# Patient Record
Sex: Male | Born: 1950
Health system: Southern US, Community
[De-identification: ages and names within clinical notes are randomized; demographics above are authoritative.]

## PROBLEM LIST (undated history)

## (undated) DIAGNOSIS — I10 Essential (primary) hypertension: Secondary | ICD-10-CM

## (undated) DIAGNOSIS — E538 Deficiency of other specified B group vitamins: Secondary | ICD-10-CM

## (undated) DIAGNOSIS — G4730 Sleep apnea, unspecified: Secondary | ICD-10-CM

## (undated) DIAGNOSIS — E119 Type 2 diabetes mellitus without complications: Secondary | ICD-10-CM

## (undated) DIAGNOSIS — E669 Obesity, unspecified: Secondary | ICD-10-CM

## (undated) DIAGNOSIS — F419 Anxiety disorder, unspecified: Secondary | ICD-10-CM

## (undated) DIAGNOSIS — M1990 Unspecified osteoarthritis, unspecified site: Secondary | ICD-10-CM

## (undated) DIAGNOSIS — K2210 Ulcer of esophagus without bleeding: Secondary | ICD-10-CM

## (undated) DIAGNOSIS — K219 Gastro-esophageal reflux disease without esophagitis: Secondary | ICD-10-CM

## (undated) DIAGNOSIS — K635 Polyp of colon: Secondary | ICD-10-CM

## (undated) DIAGNOSIS — E785 Hyperlipidemia, unspecified: Secondary | ICD-10-CM

## (undated) HISTORY — DX: Obesity, unspecified: E66.9

## (undated) HISTORY — DX: Sleep apnea, unspecified: G47.30

## (undated) HISTORY — DX: Polyp of colon: K63.5

## (undated) HISTORY — DX: Unspecified osteoarthritis, unspecified site: M19.90

## (undated) HISTORY — PX: KNEE ARTHROSCOPY: SUR90

## (undated) HISTORY — DX: Ulcer of esophagus without bleeding: K22.10

## (undated) HISTORY — DX: Essential (primary) hypertension: I10

## (undated) HISTORY — DX: Deficiency of other specified B group vitamins: E53.8

## (undated) HISTORY — DX: Hyperlipidemia, unspecified: E78.5

## (undated) HISTORY — DX: Gastro-esophageal reflux disease without esophagitis: K21.9

---

## 1998-09-29 ENCOUNTER — Ambulatory Visit: Admission: RE | Admit: 1998-09-29 | Discharge: 1998-09-29 | Payer: Self-pay | Admitting: Otolaryngology

## 2004-10-02 DIAGNOSIS — K635 Polyp of colon: Secondary | ICD-10-CM

## 2004-10-02 DIAGNOSIS — K2210 Ulcer of esophagus without bleeding: Secondary | ICD-10-CM

## 2004-10-02 HISTORY — DX: Polyp of colon: K63.5

## 2004-10-02 HISTORY — DX: Ulcer of esophagus without bleeding: K22.10

## 2004-11-18 ENCOUNTER — Ambulatory Visit: Payer: Self-pay | Admitting: Internal Medicine

## 2004-12-09 ENCOUNTER — Ambulatory Visit: Payer: Self-pay | Admitting: Internal Medicine

## 2005-01-09 ENCOUNTER — Ambulatory Visit: Payer: Self-pay | Admitting: Gastroenterology

## 2005-04-27 ENCOUNTER — Ambulatory Visit: Payer: Self-pay | Admitting: Internal Medicine

## 2005-05-03 ENCOUNTER — Ambulatory Visit: Payer: Self-pay | Admitting: Internal Medicine

## 2005-05-22 ENCOUNTER — Ambulatory Visit: Payer: Self-pay | Admitting: Gastroenterology

## 2005-07-13 ENCOUNTER — Encounter (INDEPENDENT_AMBULATORY_CARE_PROVIDER_SITE_OTHER): Payer: Self-pay | Admitting: Specialist

## 2005-07-13 ENCOUNTER — Ambulatory Visit: Payer: Self-pay | Admitting: Gastroenterology

## 2005-07-13 LAB — HM COLONOSCOPY

## 2005-08-11 ENCOUNTER — Ambulatory Visit: Payer: Self-pay | Admitting: Internal Medicine

## 2005-08-15 ENCOUNTER — Ambulatory Visit: Payer: Self-pay | Admitting: Internal Medicine

## 2005-10-03 ENCOUNTER — Ambulatory Visit: Payer: Self-pay | Admitting: Internal Medicine

## 2005-10-09 ENCOUNTER — Ambulatory Visit: Payer: Self-pay | Admitting: Endocrinology

## 2006-04-26 ENCOUNTER — Emergency Department (HOSPITAL_COMMUNITY): Admission: EM | Admit: 2006-04-26 | Discharge: 2006-04-26 | Payer: Self-pay | Admitting: Emergency Medicine

## 2006-06-13 ENCOUNTER — Ambulatory Visit: Payer: Self-pay | Admitting: Internal Medicine

## 2006-06-15 ENCOUNTER — Ambulatory Visit: Payer: Self-pay | Admitting: Internal Medicine

## 2006-08-27 ENCOUNTER — Ambulatory Visit: Payer: Self-pay | Admitting: Internal Medicine

## 2006-11-08 ENCOUNTER — Ambulatory Visit: Payer: Self-pay | Admitting: Internal Medicine

## 2007-01-07 ENCOUNTER — Ambulatory Visit: Payer: Self-pay | Admitting: Internal Medicine

## 2007-01-07 LAB — CONVERTED CEMR LAB
ALT: 51 units/L — ABNORMAL HIGH (ref 0–40)
AST: 35 units/L (ref 0–37)
Albumin: 3.8 g/dL (ref 3.5–5.2)
Alkaline Phosphatase: 44 units/L (ref 39–117)
BUN: 15 mg/dL (ref 6–23)
Basophils Absolute: 0 10*3/uL (ref 0.0–0.1)
Basophils Relative: 0.4 % (ref 0.0–1.0)
Bilirubin, Direct: 0.2 mg/dL (ref 0.0–0.3)
CO2: 31 meq/L (ref 19–32)
Calcium: 9.1 mg/dL (ref 8.4–10.5)
Chloride: 107 meq/L (ref 96–112)
Cholesterol: 200 mg/dL (ref 0–200)
Creatinine, Ser: 1 mg/dL (ref 0.4–1.5)
Eosinophils Absolute: 0.1 10*3/uL (ref 0.0–0.6)
Eosinophils Relative: 2.4 % (ref 0.0–5.0)
GFR calc Af Amer: 101 mL/min
GFR calc non Af Amer: 83 mL/min
Glucose, Bld: 123 mg/dL — ABNORMAL HIGH (ref 70–99)
HCT: 45.6 % (ref 39.0–52.0)
HDL: 39.3 mg/dL (ref >39.0)
Hemoglobin: 15.6 g/dL (ref 13.0–17.0)
Hgb A1c MFr Bld: 5.7 % (ref 4.6–6.0)
LDL Cholesterol: 135 mg/dL — ABNORMAL HIGH (ref 0–99)
Lymphocytes Relative: 22 % (ref 12.0–46.0)
MCHC: 34.2 g/dL (ref 30.0–36.0)
MCV: 98.3 fL (ref 78.0–100.0)
Monocytes Absolute: 0.8 10*3/uL — ABNORMAL HIGH (ref 0.2–0.7)
Monocytes Relative: 15.5 % — ABNORMAL HIGH (ref 3.0–11.0)
Neutro Abs: 3.2 10*3/uL (ref 1.4–7.7)
Neutrophils Relative %: 59.7 % (ref 43.0–77.0)
Platelets: 155 10*3/uL (ref 150–400)
Potassium: 4.5 meq/L (ref 3.5–5.1)
RBC: 4.64 M/uL (ref 4.22–5.81)
RDW: 11.6 % (ref 11.5–14.6)
Sodium: 143 meq/L (ref 135–145)
Total Bilirubin: 0.9 mg/dL (ref 0.3–1.2)
Total CHOL/HDL Ratio: 5.1
Total Protein: 6.8 g/dL (ref 6.0–8.3)
Triglycerides: 130 mg/dL (ref 0–149)
VLDL: 26 mg/dL (ref 0–40)
Vitamin B-12: 248 pg/mL (ref 211–911)
WBC: 5.3 10*3/uL (ref 4.5–10.5)

## 2007-01-10 ENCOUNTER — Ambulatory Visit: Payer: Self-pay | Admitting: Internal Medicine

## 2007-01-15 ENCOUNTER — Ambulatory Visit: Payer: Self-pay | Admitting: Internal Medicine

## 2007-04-27 ENCOUNTER — Encounter: Payer: Self-pay | Admitting: Internal Medicine

## 2007-04-27 DIAGNOSIS — E538 Deficiency of other specified B group vitamins: Secondary | ICD-10-CM | POA: Insufficient documentation

## 2007-04-27 DIAGNOSIS — R7309 Other abnormal glucose: Secondary | ICD-10-CM | POA: Insufficient documentation

## 2007-04-27 DIAGNOSIS — Z9989 Dependence on other enabling machines and devices: Secondary | ICD-10-CM | POA: Insufficient documentation

## 2007-04-27 DIAGNOSIS — G4730 Sleep apnea, unspecified: Secondary | ICD-10-CM

## 2007-04-27 DIAGNOSIS — K219 Gastro-esophageal reflux disease without esophagitis: Secondary | ICD-10-CM | POA: Insufficient documentation

## 2007-04-27 DIAGNOSIS — E785 Hyperlipidemia, unspecified: Secondary | ICD-10-CM | POA: Insufficient documentation

## 2007-04-27 DIAGNOSIS — J309 Allergic rhinitis, unspecified: Secondary | ICD-10-CM | POA: Insufficient documentation

## 2007-06-13 ENCOUNTER — Ambulatory Visit: Payer: Self-pay | Admitting: Internal Medicine

## 2007-06-13 LAB — CONVERTED CEMR LAB
ALT: 39 units/L (ref 0–53)
AST: 31 units/L (ref 0–37)
Albumin: 4 g/dL (ref 3.5–5.2)
Alkaline Phosphatase: 55 units/L (ref 39–117)
BUN: 10 mg/dL (ref 6–23)
Basophils Absolute: 0 10*3/uL (ref 0.0–0.1)
Basophils Relative: 0.5 % (ref 0.0–1.0)
Bilirubin, Direct: 0.2 mg/dL (ref 0.0–0.3)
CO2: 29 meq/L (ref 19–32)
Calcium: 9.1 mg/dL (ref 8.4–10.5)
Chloride: 105 meq/L (ref 96–112)
Cholesterol: 187 mg/dL (ref 0–200)
Creatinine, Ser: 1 mg/dL (ref 0.4–1.5)
Eosinophils Absolute: 0.2 10*3/uL (ref 0.0–0.6)
Eosinophils Relative: 3.2 % (ref 0.0–5.0)
Folate: 7.9 ng/mL
GFR calc Af Amer: 100 mL/min
GFR calc non Af Amer: 83 mL/min
Glucose, Bld: 113 mg/dL — ABNORMAL HIGH (ref 70–99)
HCT: 44.2 % (ref 39.0–52.0)
HDL: 35.6 mg/dL — ABNORMAL LOW (ref >39.0)
Hemoglobin: 15.6 g/dL (ref 13.0–17.0)
Hgb A1c MFr Bld: 5.6 % (ref 4.6–6.0)
LDL Cholesterol: 123 mg/dL — ABNORMAL HIGH (ref 0–99)
Lymphocytes Relative: 26.2 % (ref 12.0–46.0)
MCHC: 35.3 g/dL (ref 30.0–36.0)
MCV: 99.9 fL (ref 78.0–100.0)
Monocytes Absolute: 0.5 10*3/uL (ref 0.2–0.7)
Monocytes Relative: 10.1 % (ref 3.0–11.0)
Neutro Abs: 3.1 10*3/uL (ref 1.4–7.7)
Neutrophils Relative %: 60 % (ref 43.0–77.0)
PSA: 0.48 ng/mL (ref 0.10–4.00)
Platelets: 162 10*3/uL (ref 150–400)
Potassium: 4.5 meq/L (ref 3.5–5.1)
RBC: 4.42 M/uL (ref 4.22–5.81)
RDW: 11.6 % (ref 11.5–14.6)
Sodium: 139 meq/L (ref 135–145)
TSH: 2.64 microintl units/mL (ref 0.35–5.50)
Total Bilirubin: 1.1 mg/dL (ref 0.3–1.2)
Total CHOL/HDL Ratio: 5.3
Total Protein: 6.9 g/dL (ref 6.0–8.3)
Triglycerides: 143 mg/dL (ref 0–149)
VLDL: 29 mg/dL (ref 0–40)
Vitamin B-12: 342 pg/mL (ref 211–911)
WBC: 5.2 10*3/uL (ref 4.5–10.5)

## 2007-06-18 ENCOUNTER — Ambulatory Visit: Payer: Self-pay | Admitting: Internal Medicine

## 2007-06-27 ENCOUNTER — Encounter: Payer: Self-pay | Admitting: Internal Medicine

## 2007-06-27 DIAGNOSIS — I10 Essential (primary) hypertension: Secondary | ICD-10-CM | POA: Insufficient documentation

## 2007-09-06 ENCOUNTER — Encounter: Payer: Self-pay | Admitting: Internal Medicine

## 2008-02-21 ENCOUNTER — Ambulatory Visit: Payer: Self-pay | Admitting: Internal Medicine

## 2008-02-21 DIAGNOSIS — L0293 Carbuncle, unspecified: Secondary | ICD-10-CM

## 2008-02-21 DIAGNOSIS — M1990 Unspecified osteoarthritis, unspecified site: Secondary | ICD-10-CM | POA: Insufficient documentation

## 2008-02-21 DIAGNOSIS — L0292 Furuncle, unspecified: Secondary | ICD-10-CM | POA: Insufficient documentation

## 2008-08-07 ENCOUNTER — Ambulatory Visit: Payer: Self-pay | Admitting: Internal Medicine

## 2008-08-11 LAB — CONVERTED CEMR LAB
ALT: 59 units/L — ABNORMAL HIGH (ref 0–53)
AST: 42 units/L — ABNORMAL HIGH (ref 0–37)
Albumin: 4 g/dL (ref 3.5–5.2)
Alkaline Phosphatase: 48 units/L (ref 39–117)
BUN: 14 mg/dL (ref 6–23)
Basophils Absolute: 0 10*3/uL (ref 0.0–0.1)
Basophils Relative: 0.7 % (ref 0.0–3.0)
Bilirubin Urine: NEGATIVE
Bilirubin, Direct: 0.1 mg/dL (ref 0.0–0.3)
CO2: 29 meq/L (ref 19–32)
Calcium: 9.1 mg/dL (ref 8.4–10.5)
Chloride: 105 meq/L (ref 96–112)
Cholesterol: 193 mg/dL (ref 0–200)
Creatinine, Ser: 1 mg/dL (ref 0.4–1.5)
Eosinophils Absolute: 0.2 10*3/uL (ref 0.0–0.7)
Eosinophils Relative: 3.6 % (ref 0.0–5.0)
GFR calc Af Amer: 100 mL/min
GFR calc non Af Amer: 82 mL/min
Glucose, Bld: 108 mg/dL — ABNORMAL HIGH (ref 70–99)
HCT: 43.2 % (ref 39.0–52.0)
HDL: 44.2 mg/dL (ref >39.0)
Hemoglobin, Urine: NEGATIVE
Hemoglobin: 15.2 g/dL (ref 13.0–17.0)
Ketones, ur: NEGATIVE mg/dL
LDL Cholesterol: 132 mg/dL — ABNORMAL HIGH (ref 0–99)
Leukocytes, UA: NEGATIVE
Lymphocytes Relative: 22.8 % (ref 12.0–46.0)
MCHC: 35.3 g/dL (ref 30.0–36.0)
MCV: 101 fL — ABNORMAL HIGH (ref 78.0–100.0)
Monocytes Absolute: 0.6 10*3/uL (ref 0.1–1.0)
Monocytes Relative: 11.8 % (ref 3.0–12.0)
Neutro Abs: 3.1 10*3/uL (ref 1.4–7.7)
Neutrophils Relative %: 61.1 % (ref 43.0–77.0)
Nitrite: NEGATIVE
PSA: 0.48 ng/mL (ref 0.10–4.00)
Platelets: 139 10*3/uL — ABNORMAL LOW (ref 150–400)
Potassium: 4.5 meq/L (ref 3.5–5.1)
RBC: 4.27 M/uL (ref 4.22–5.81)
RDW: 11.6 % (ref 11.5–14.6)
Sodium: 141 meq/L (ref 135–145)
Specific Gravity, Urine: 1.02 (ref 1.000–1.03)
TSH: 2.2 microintl units/mL (ref 0.35–5.50)
Total Bilirubin: 0.9 mg/dL (ref 0.3–1.2)
Total CHOL/HDL Ratio: 4.4
Total Protein, Urine: NEGATIVE mg/dL
Total Protein: 7 g/dL (ref 6.0–8.3)
Triglycerides: 86 mg/dL (ref 0–149)
Urine Glucose: NEGATIVE mg/dL
Urobilinogen, UA: 0.2 (ref 0.0–1.0)
VLDL: 17 mg/dL (ref 0–40)
WBC: 5 10*3/uL (ref 4.5–10.5)
pH: 6 (ref 5.0–8.0)

## 2008-08-12 ENCOUNTER — Ambulatory Visit: Payer: Self-pay | Admitting: Internal Medicine

## 2008-08-12 DIAGNOSIS — M542 Cervicalgia: Secondary | ICD-10-CM | POA: Insufficient documentation

## 2008-10-29 ENCOUNTER — Telehealth: Payer: Self-pay | Admitting: Internal Medicine

## 2008-11-11 ENCOUNTER — Ambulatory Visit: Payer: Self-pay | Admitting: Internal Medicine

## 2008-11-11 DIAGNOSIS — J069 Acute upper respiratory infection, unspecified: Secondary | ICD-10-CM | POA: Insufficient documentation

## 2009-03-20 ENCOUNTER — Ambulatory Visit: Payer: Self-pay | Admitting: Family Medicine

## 2009-03-20 DIAGNOSIS — L255 Unspecified contact dermatitis due to plants, except food: Secondary | ICD-10-CM | POA: Insufficient documentation

## 2009-06-09 ENCOUNTER — Ambulatory Visit: Payer: Self-pay | Admitting: Internal Medicine

## 2009-06-09 DIAGNOSIS — J029 Acute pharyngitis, unspecified: Secondary | ICD-10-CM | POA: Insufficient documentation

## 2009-08-09 ENCOUNTER — Ambulatory Visit: Payer: Self-pay | Admitting: Internal Medicine

## 2009-08-10 LAB — CONVERTED CEMR LAB
ALT: 59 units/L — ABNORMAL HIGH (ref 0–53)
AST: 39 units/L — ABNORMAL HIGH (ref 0–37)
Alkaline Phosphatase: 48 units/L (ref 39–117)
BUN: 15 mg/dL (ref 6–23)
Bilirubin, Direct: 0.1 mg/dL (ref 0.0–0.3)
CO2: 31 meq/L (ref 19–32)
Calcium: 9.1 mg/dL (ref 8.4–10.5)
Eosinophils Relative: 2.6 % (ref 0.0–5.0)
Glucose, Bld: 103 mg/dL — ABNORMAL HIGH (ref 70–99)
HCT: 44 % (ref 39.0–52.0)
HDL: 51.5 mg/dL (ref >39.00)
Ketones, ur: NEGATIVE mg/dL
Leukocytes, UA: NEGATIVE
Monocytes Relative: 10.1 % (ref 3.0–12.0)
Neutrophils Relative %: 65.2 % (ref 43.0–77.0)
Platelets: 138 10*3/uL — ABNORMAL LOW (ref 150.0–400.0)
RBC: 4.3 M/uL (ref 4.22–5.81)
Sodium: 143 meq/L (ref 135–145)
Specific Gravity, Urine: 1.01 (ref 1.000–1.030)
Total Bilirubin: 1 mg/dL (ref 0.3–1.2)
Total CHOL/HDL Ratio: 4
Total Protein, Urine: NEGATIVE mg/dL
Urine Glucose: NEGATIVE mg/dL
VLDL: 13.6 mg/dL (ref 0.0–40.0)
WBC: 5.8 10*3/uL (ref 4.5–10.5)
pH: 6 (ref 5.0–8.0)

## 2009-08-13 ENCOUNTER — Ambulatory Visit: Payer: Self-pay | Admitting: Internal Medicine

## 2009-08-13 DIAGNOSIS — R7989 Other specified abnormal findings of blood chemistry: Secondary | ICD-10-CM | POA: Insufficient documentation

## 2009-08-13 DIAGNOSIS — Z87891 Personal history of nicotine dependence: Secondary | ICD-10-CM | POA: Insufficient documentation

## 2009-08-13 DIAGNOSIS — R945 Abnormal results of liver function studies: Secondary | ICD-10-CM

## 2009-12-08 ENCOUNTER — Ambulatory Visit: Payer: Self-pay | Admitting: Internal Medicine

## 2009-12-08 DIAGNOSIS — L723 Sebaceous cyst: Secondary | ICD-10-CM | POA: Insufficient documentation

## 2009-12-28 ENCOUNTER — Ambulatory Visit: Payer: Self-pay | Admitting: Internal Medicine

## 2009-12-28 DIAGNOSIS — R635 Abnormal weight gain: Secondary | ICD-10-CM | POA: Insufficient documentation

## 2009-12-28 DIAGNOSIS — R1310 Dysphagia, unspecified: Secondary | ICD-10-CM | POA: Insufficient documentation

## 2009-12-28 DIAGNOSIS — R079 Chest pain, unspecified: Secondary | ICD-10-CM | POA: Insufficient documentation

## 2009-12-29 ENCOUNTER — Telehealth: Payer: Self-pay | Admitting: Gastroenterology

## 2010-01-10 ENCOUNTER — Ambulatory Visit: Payer: Self-pay | Admitting: Gastroenterology

## 2010-01-10 DIAGNOSIS — Z860100 Personal history of colon polyps, unspecified: Secondary | ICD-10-CM | POA: Insufficient documentation

## 2010-01-10 DIAGNOSIS — K5730 Diverticulosis of large intestine without perforation or abscess without bleeding: Secondary | ICD-10-CM | POA: Insufficient documentation

## 2010-02-24 ENCOUNTER — Telehealth: Payer: Self-pay | Admitting: Internal Medicine

## 2010-03-02 ENCOUNTER — Ambulatory Visit: Payer: Self-pay | Admitting: Internal Medicine

## 2010-03-02 LAB — CONVERTED CEMR LAB
Bilirubin, Direct: 0.2 mg/dL (ref 0.0–0.3)
CO2: 29 meq/L (ref 19–32)
GFR calc non Af Amer: 82.79 mL/min (ref >60)
Glucose, Bld: 100 mg/dL — ABNORMAL HIGH (ref 70–99)
HDL: 55.6 mg/dL (ref >39.00)
Potassium: 4.1 meq/L (ref 3.5–5.1)
Sodium: 140 meq/L (ref 135–145)
Total Protein: 6.9 g/dL (ref 6.0–8.3)
VLDL: 32.8 mg/dL (ref 0.0–40.0)

## 2010-03-04 ENCOUNTER — Ambulatory Visit: Payer: Self-pay | Admitting: Internal Medicine

## 2010-08-03 ENCOUNTER — Telehealth: Payer: Self-pay | Admitting: Internal Medicine

## 2010-08-30 ENCOUNTER — Ambulatory Visit: Payer: Self-pay | Admitting: Internal Medicine

## 2010-10-07 ENCOUNTER — Ambulatory Visit
Admission: RE | Admit: 2010-10-07 | Discharge: 2010-10-07 | Payer: Self-pay | Source: Home / Self Care | Attending: Internal Medicine | Admitting: Internal Medicine

## 2010-10-07 ENCOUNTER — Other Ambulatory Visit: Payer: Self-pay | Admitting: Internal Medicine

## 2010-10-07 LAB — URINALYSIS
Bilirubin Urine: NEGATIVE
Hemoglobin, Urine: NEGATIVE
Ketones, ur: NEGATIVE
Leukocytes, UA: NEGATIVE
Nitrite: NEGATIVE
Specific Gravity, Urine: 1.015 (ref 1.000–1.030)
Total Protein, Urine: NEGATIVE
Urine Glucose: NEGATIVE
Urobilinogen, UA: 1 (ref 0.0–1.0)
pH: 6 (ref 5.0–8.0)

## 2010-10-07 LAB — HEPATIC FUNCTION PANEL
ALT: 64 U/L — ABNORMAL HIGH (ref 0–53)
AST: 52 U/L — ABNORMAL HIGH (ref 0–37)
Albumin: 4 g/dL (ref 3.5–5.2)
Alkaline Phosphatase: 52 U/L (ref 39–117)
Bilirubin, Direct: 0.2 mg/dL (ref 0.0–0.3)
Total Bilirubin: 1.1 mg/dL (ref 0.3–1.2)
Total Protein: 6.9 g/dL (ref 6.0–8.3)

## 2010-10-07 LAB — CBC WITH DIFFERENTIAL/PLATELET
Basophils Absolute: 0 10*3/uL (ref 0.0–0.1)
Basophils Relative: 0.3 % (ref 0.0–3.0)
Eosinophils Absolute: 0.3 10*3/uL (ref 0.0–0.7)
Eosinophils Relative: 4.3 % (ref 0.0–5.0)
HCT: 45.1 % (ref 39.0–52.0)
Hemoglobin: 15.7 g/dL (ref 13.0–17.0)
Lymphocytes Relative: 23.6 % (ref 12.0–46.0)
Lymphs Abs: 1.4 10*3/uL (ref 0.7–4.0)
MCHC: 34.9 g/dL (ref 30.0–36.0)
MCV: 100.4 fl — ABNORMAL HIGH (ref 78.0–100.0)
Monocytes Absolute: 0.6 10*3/uL (ref 0.1–1.0)
Monocytes Relative: 9.8 % (ref 3.0–12.0)
Neutro Abs: 3.8 10*3/uL (ref 1.4–7.7)
Neutrophils Relative %: 62 % (ref 43.0–77.0)
Platelets: 144 10*3/uL — ABNORMAL LOW (ref 150.0–400.0)
RBC: 4.49 Mil/uL (ref 4.22–5.81)
RDW: 12.7 % (ref 11.5–14.6)
WBC: 6.1 10*3/uL (ref 4.5–10.5)

## 2010-10-07 LAB — LIPID PANEL
Cholesterol: 207 mg/dL — ABNORMAL HIGH (ref 0–200)
HDL: 35.1 mg/dL — ABNORMAL LOW (ref >39.00)
Total CHOL/HDL Ratio: 6
Triglycerides: 195 mg/dL — ABNORMAL HIGH (ref 0.0–149.0)
VLDL: 39 mg/dL (ref 0.0–40.0)

## 2010-10-07 LAB — BASIC METABOLIC PANEL
BUN: 17 mg/dL (ref 6–23)
CO2: 25 mEq/L (ref 19–32)
Calcium: 9.2 mg/dL (ref 8.4–10.5)
Chloride: 107 mEq/L (ref 96–112)
Creatinine, Ser: 1 mg/dL (ref 0.4–1.5)
GFR: 81.67 mL/min (ref >60.00)
Glucose, Bld: 105 mg/dL — ABNORMAL HIGH (ref 70–99)
Potassium: 4.3 mEq/L (ref 3.5–5.1)
Sodium: 142 mEq/L (ref 135–145)

## 2010-10-07 LAB — TSH: TSH: 2.69 u[IU]/mL (ref 0.35–5.50)

## 2010-10-07 LAB — LDL CHOLESTEROL, DIRECT: Direct LDL: 146.2 mg/dL

## 2010-10-07 LAB — PSA: PSA: 0.43 ng/mL (ref 0.10–4.00)

## 2010-10-12 ENCOUNTER — Ambulatory Visit
Admission: RE | Admit: 2010-10-12 | Discharge: 2010-10-12 | Payer: Self-pay | Source: Home / Self Care | Attending: Internal Medicine | Admitting: Internal Medicine

## 2010-10-12 ENCOUNTER — Encounter: Payer: Self-pay | Admitting: Internal Medicine

## 2010-10-12 DIAGNOSIS — M25569 Pain in unspecified knee: Secondary | ICD-10-CM | POA: Insufficient documentation

## 2010-10-20 ENCOUNTER — Telehealth: Payer: Self-pay | Admitting: Internal Medicine

## 2010-11-01 NOTE — Assessment & Plan Note (Signed)
Summary: dysphagia...em   History of Present Illness Visit Type: Initial Consult Primary GI MD: Elie Goody MD [facility] Primary Provider: Sonda Primes, MD Requesting Provider: Sonda Primes, MD Chief Complaint: trouble swallowing pills, chest pain noncardiac, GERD History of Present Illness:   60 YO MALE KNOWN TO DR. Russella Dar WITH  HX OF CHRONIC GERD, HX OF ULCERATIVE ESOPHAGITIS ON EGD 2006. COLONOSCOPY 10/06 SHOWED LEFT COLON DIVERTICULOSIS AND MULTIPLE SMALL HYPERPLASTIC POLYPS. HE HAS BEEN ON PRILOSEC LONG TERM WHICH WORKED FAIRLY WELL BUT HE STOPPED IT FOR A COUPLE WEEKS-WORRIED ABOUT LONG TERM SIDE EFFECTS ETC.HE DEVELOPED MID STERNAL CHEST PAIN ON MONDAY A WEEK AGO THAT LASTED ALL DAY LONG. HE WAS WORKING -HAD NO ASSOCIATED SOB, LIGHTHEADEDNESS, WEAKNES ETC. HE WAS SEEN BY DR. Posey Rea AND GIVEN SAMPLES OF NEXIUM 40 MG TWICE DAILY. HIS SXS RESOLVED BY THE FOLLOWING DAY, AND HAVE NOR RECURRED.  HE DENIES DYSPHAGIA OR ODYNOPHAGIA, OCCASIONALLY HAS FEELING THAT PILLS ARE STICKING. DENIES ANY NOCTURNAL SXS.   GI Review of Systems    Reports acid reflux, belching, chest pain, and  heartburn.      Denies abdominal pain, bloating, dysphagia with liquids, dysphagia with solids, loss of appetite, nausea, vomiting, vomiting blood, and  weight loss.        Denies anal fissure, black tarry stools, change in bowel habit, constipation, diarrhea, diverticulosis, fecal incontinence, heme positive stool, hemorrhoids, irritable bowel syndrome, jaundice, light color stool, liver problems, rectal bleeding, and  rectal pain.   Current Medications (verified): 1)  Enalapril Maleate 10 Mg  Tabs (Enalapril Maleate) .Marland Kitchen.. 1 Once Daily 2)  Crestor 10 Mg  Tabs (Rosuvastatin Calcium) .Marland Kitchen.. 1 Once Daily 3)  Diazepam 2 Mg  Tabs (Diazepam) .... 1/2-1 As Needed Qd 4)  Vitamin B-12 Cr 1000 Mcg  Tbcr (Cyanocobalamin) .... Take One Tablet By Mouth Daily 5)  Diclofenac Sodium 50 Mg  Tbec (Diclofenac Sodium) .Marland Kitchen.. 1 By  Mouth Two Times A Day As Needed Pain 6)  Cialis 20 Mg Tabs (Tadalafil) .Marland Kitchen.. 1 Tablet Every Other Day As Needed 7)  Cpap Mask .... Use As Directed 8)  Cpap Machine Rental .... Use As Directed For 2 Weeks Until Personal Cpap Machine Arrives 9)  Levitra 20 Mg Tabs (Vardenafil Hcl) .Marland Kitchen.. 1 By Mouth Once Daily Prn 10)  Nexium 40 Mg Cpdr (Esomeprazole Magnesium) .Marland Kitchen.. 1 By Mouth Qam ( Medically Necessary)  Allergies (verified): 1)  Hydrocodone-Acetaminophen (Hydrocodone-Acetaminophen) 2)  Lipitor (Atorvastatin Calcium)  Past History:  Past Medical History: Hyperlipidemia Hypertension Osteoarthritis Vit B12 def Allergic rhinitis GERD, Ulcerative esophagitis, Dr Russella Dar, 2006 Hyperplastic colon polyps, 2006 Obesity Sleep Apnea  Past Surgical History: Arthroscopy Rt. knee  Family History: Reviewed history from 02/21/2008 and no changes required. Family History Hypertension Family History of Breast Cancer: mother  Social History: Reviewed history from 06/18/2007 and no changes required. Married (now separated) Former Smoker Alcohol 3-4 per day Occupation: Soil scientist Daily Caffeine Use  2-3 per day  Review of Systems       The patient complains of allergy/sinus and arthritis/joint pain.  The patient denies anemia, anxiety-new, back pain, blood in urine, breast changes/lumps, change in vision, confusion, cough, coughing up blood, depression-new, fainting, fatigue, fever, headaches-new, hearing problems, heart murmur, heart rhythm changes, itching, muscle pains/cramps, night sweats, nosebleeds, shortness of breath, skin rash, sleeping problems, sore throat, swelling of feet/legs, swollen lymph glands, thirst - excessive, urination - excessive, urination changes/pain, urine leakage, vision changes, and voice change.  ROS OTHERWISE AS IN HPI  Vital Signs:  Patient profile:   60 year old male Height:      71 inches Weight:      256.50 pounds BMI:     35.90 Pulse rate:   88 /  minute Pulse rhythm:   regular BP sitting:   140 / 86  (left arm)  Vitals Entered By: Milford Cage NCMA (January 10, 2010 8:36 AM)  Physical Exam  General:  Well developed, well nourished, no acute distress. Head:  Normocephalic and atraumatic. Eyes:  PERRLA, no icterus. Neck:  Supple; no masses or thyromegaly. Lungs:  Clear throughout to auscultation. Heart:  Regular rate and rhythm; no murmurs, rubs,  or bruits. Abdomen:  SOFT, NONTENDER, NO MASS OR HSM,BS+ Rectal:  NOT DONE Extremities:  No clubbing, cyanosis, edema or deformities noted. Neurologic:  Alert and  oriented x4;  grossly normal neurologically. Psych:  Alert and cooperative. Normal mood and affect.  Impression & Recommendations:  Problem # 1:  CHEST PAIN (ICD-786.50) Assessment New 60 YO MALE WITH HX OF CHRONIC GERD, AND PREVIUOS ULCERATIVE ESOPHAGITIS WITH ONE DAY EPISODE OF MID STERNAL CHEST PAIN WHILE OFF PPI RX. SUSPECT RECURRENT REFLUX ESOPHAGITIS. HAS BEEN EVALUATED BY PCP, AND PREVIOUS STRESS TESTING NEGATIVE CONTINUE NEXIUM 40 MG TWICE DAILYX 2 WEEKS THEN DAILY IN AM THEREAFTER. REVIEIWED ANTI REFLUX REGIMEN, AND RATIONALE FOR CONTINUING PPI THERAPY WITH HIS HX. DISCUSSED EGD, DUE TO HIS VAGUE PILL DYSPHAGIA-HE WILL MONITOR AND IF SXS PROGRESS AT ALL WILL CALL BACK TO SCHEDULE EGD WITH POSSIBLE DILATION.  Problem # 2:  PERSONAL HX COLONIC POLYPS (ICD-V12.72) Assessment: Comment Only HYPERPLASTIC- WILL BE DUE FOR FOLLOW UP COLONOSCOPY IN 2016.  Patient Instructions: 1)  We changed the prescription to once daily and sent it to [facility]  2)  [place]. 3)  Call us if swallowing difficulty persists.  We may need to discuss having the Endoscopy. 4)  GI Reflux brochure given.  5)  Copy sent to : Sonda Primes, MD 6)  The medication list was reviewed and reconciled.  All changed / newly prescribed medications were explained.  A complete medication list was provided to the patient /  caregiver.  Prescriptions: NEXIUM 40 MG CPDR (ESOMEPRAZOLE MAGNESIUM) 1 by mouth qam ( medically necessary)  #30 x 3   Entered by:   Lowry Ram NCMA   Authorized by:   Sammuel Cooper PA-c   Signed by:   Lowry Ram NCMA on 01/10/2010   Method used:   Electronically to        [facility] [place] [facility] (retail)       [address]       [place], [place]  [address]       Ph: [phone]       Fax: [phone]   RxID:   [id number]

## 2010-11-01 NOTE — Assessment & Plan Note (Signed)
Summary: ?cyst left shoulder,getting more red, req appt 3/9/cd   Vital Signs:  Patient profile:   60 year old male Weight:      263 pounds Temp:     98.7 degrees F oral Pulse rate:   86 / minute BP sitting:   132 / 90  (left arm)  Vitals Entered By: Tora Perches (December 08, 2009 10:20 AM)  Procedure Note Last Tetanus: Historical (04/30/1998)  Cyst Removal: The patient complains of pain, redness, inflammation, swelling, and discharge. Onset of lesion: 5 days Indication: infected lesion Consent signed: yes  Procedure #1: I&D    Size (in cm): 3.0 x 3.2    Region: lateral    Location: L shoulder    Comment: Risks including but not limited by incomplete procedure, bleeding, infection, recurrence were discussed with the patient. Consent form was signed. Cross like incision 1 cm was made and the cavity was probed w/foreceps - 3 cc of selly cheese-like material was extracted. 2-3" packing was inserted.Telfa and gause w/Sivadene cream. Tolerated well. Complicatons - none. Good pain relief following the procedure.      Instrument used: #10 blade    Anesthesia: 3.0 ml 1% lidocaine w/epinephrine  Cleaned and prepped with: alcohol, betadine, scrubbing, and deep debridement Wound dressing: bulky gauze dressing Instructions: daily dressing changes  CC: pt c/o of a cyst on left shoulder Is Patient Diabetic? No   CC:  pt c/o of a cyst on left shoulder.  History of Present Illness: C/o painful swellingw w/a drainage On the L upper back x 5 d C/o ED  Preventive Screening-Counseling & Management  Alcohol-Tobacco     Smoking Status: quit  Current Medications (verified): 1)  Enalapril Maleate 10 Mg  Tabs (Enalapril Maleate) .Marland Kitchen.. 1 Once Daily 2)  Crestor 10 Mg  Tabs (Rosuvastatin Calcium) .Marland Kitchen.. 1 Once Daily 3)  Prilosec 20 Mg  Cpdr (Omeprazole) .Marland Kitchen.. 1 Once Daily 4)  Diazepam 2 Mg  Tabs (Diazepam) .... 1/2-1 As Needed Qd 5)  Triamcinolone Acetonide 0.1 %  Oint (Triamcinolone Acetonide)  .... Apply Two Times A Day As Needed 6)  Vitamin B-12 Cr 1000 Mcg  Tbcr (Cyanocobalamin) .... Take One Tablet By Mouth Daily 7)  Diclofenac Sodium 50 Mg  Tbec (Diclofenac Sodium) .Marland Kitchen.. 1 By Mouth Two Times A Day As Needed Pain 8)  Flonase 50 Mcg/act Susp (Fluticasone Propionate) .... Use 2 Spray in Each Nostril Daily 6 Month Follow-Up Is Due No Addtional Refills Until Appt 9)  Tamiflu 75 Mg Caps (Oseltamivir Phosphate) .... Take One Capsule By Mouth Twice A Day 10)  Cialis 20 Mg Tabs (Tadalafil) .Marland Kitchen.. 1 Tablet Every Other Day As Needed 11)  Cpap Mask .... Use As Directed 12)  Cpap Machine Rental .... Use As Directed For 2 Weeks Until Personal Cpap Machine Arrives 13)  Benadryl 25 Mg Tabs (Diphenhydramine Hcl) 14)  Loratadine 10 Mg  Tabs (Loratadine) .... Once Daily As Needed Allergies  Allergies: 1)  Hydrocodone-Acetaminophen (Hydrocodone-Acetaminophen) 2)  Lipitor (Atorvastatin Calcium)  Past History:  Past Medical History: Last updated: 08/13/2009 Hyperlipidemia Hypertension Osteoarthritis Vit B12 def Allergic rhinitis  Social History: Last updated: 06/18/2007 Married Former Smoker Alcohol use-no  Review of Systems  The patient denies fever.    Physical Exam  General:  Well-developed in no acute distress; alert,appropriate and cooperative throughout examination overweight-appearing.   Mouth:  Oral mucosa and oropharynx without lesions or exudates.  Teeth in good repair. Neck:  No deformities, masses, or tenderness noted. Lungs:  Normal  respiratory effort, chest expands symmetrically. Lungs are clear to auscultation, no crackles or wheezes. Heart:  Normal rate and regular rhythm. S1 and S2 normal without gallop, murmur, click, rub or other extra sounds. Abdomen:  Bowel sounds positive,abdomen soft and non-tender without masses, organomegaly or hernias noted. Msk:  No deformity or scoliosis noted of thoracic or lumbar spine.   Skin:  3 cm erythematous subcutaneously mass,  tender   Impression & Recommendations:  Problem # 1:  SEBACEOUS CYST, INFECTED (ICD-706.2) Assessment New  Orders: I&D Abscess, Complex (10061)  Problem # 2:  ERECTILE DYSFUNCTION, ORGANIC (ICD-607.84) Assessment: Unchanged  His updated medication list for this problem includes:    Cialis 20 Mg Tabs (Tadalafil) .Marland Kitchen... 1 tablet every other day as needed    Levitra 20 Mg Tabs (Vardenafil hcl) .Marland Kitchen... 1 by mouth once daily prn  Complete Medication List: 1)  Enalapril Maleate 10 Mg Tabs (Enalapril maleate) .Marland Kitchen.. 1 once daily 2)  Crestor 10 Mg Tabs (Rosuvastatin calcium) .Marland Kitchen.. 1 once daily 3)  Prilosec 20 Mg Cpdr (Omeprazole) .Marland Kitchen.. 1 once daily 4)  Diazepam 2 Mg Tabs (Diazepam) .... 1/2-1 as needed qd 5)  Triamcinolone Acetonide 0.1 % Oint (Triamcinolone acetonide) .... Apply two times a day as needed 6)  Vitamin B-12 Cr 1000 Mcg Tbcr (Cyanocobalamin) .... Take one tablet by mouth daily 7)  Diclofenac Sodium 50 Mg Tbec (Diclofenac sodium) .Marland Kitchen.. 1 by mouth two times a day as needed pain 8)  Flonase 50 Mcg/act Susp (Fluticasone propionate) .... Use 2 spray in each nostril daily 6 month follow-up is due no addtional refills until appt 9)  Tamiflu 75 Mg Caps (Oseltamivir phosphate) .... Take one capsule by mouth twice a day 10)  Cialis 20 Mg Tabs (Tadalafil) .Marland Kitchen.. 1 tablet every other day as needed 11)  Cpap Mask  .... Use as directed 12)  Cpap Machine Rental  .... Use as directed for 2 weeks until personal cpap machine arrives 13)  Benadryl 25 Mg Tabs (Diphenhydramine hcl) 14)  Loratadine 10 Mg Tabs (Loratadine) .... Once daily as needed allergies 15)  Levitra 20 Mg Tabs (Vardenafil hcl) .Marland Kitchen.. 1 by mouth once daily prn 16)  Doxycycline Monohydrate 100 Mg Caps (Doxycycline monohydrate) .Marland Kitchen.. 1 by mouth two times a day with a glass of water  Patient Instructions: 1)  Please schedule a follow-up appointment in 1 month. 2)  Call if you are not better in a reasonable amount of time or if worse.    Prescriptions: DOXYCYCLINE MONOHYDRATE 100 MG CAPS (DOXYCYCLINE MONOHYDRATE) 1 by mouth two times a day with a glass of water  #20 x 0   Entered and Authorized by:   Tresa Garter MD   Signed by:   Tresa Garter MD on 12/08/2009   Method used:   Print then Give to Patient   RxID:   [id number] LEVITRA 20 MG TABS (VARDENAFIL HCL) 1 by mouth once daily prn  #12 x 6   Entered and Authorized by:   Tresa Garter MD   Signed by:   Tresa Garter MD on 12/08/2009   Method used:   Print then Give to Patient   RxID:   [id number]

## 2010-11-01 NOTE — Miscellaneous (Signed)
Summary: I+D/[facility]  I+D/[facility]   Imported By: Sherian Rein 12/17/2009 13:18:56  _____________________________________________________________________  External Attachment:    Type:   Image     Comment:   External Document

## 2010-11-01 NOTE — Progress Notes (Signed)
Summary: Crestor ?  Phone Note From Pharmacy   Caller: [facility] [address] Summary of Call: rec request to change Crestor 10mg  to 40mg .  Is this ok? Initial call taken by: Lanier Prude, [facility]),  August 03, 2010 10:00 AM  Follow-up for Phone Call        ok Follow-up by: Tresa Garter MD,  August 03, 2010 5:41 PM  Additional Follow-up for Phone Call Additional follow up Details #1::        Please disregard prinited Rx. Margaret Pyle, CMA  August 04, 2010 8:16 AM     New/Updated Medications: CRESTOR 40 MG TABS (ROSUVASTATIN CALCIUM) 1 by mouth once daily for cholesterol Prescriptions: CRESTOR 40 MG TABS (ROSUVASTATIN CALCIUM) 1 by mouth once daily for cholesterol  #30 x 11   Entered by:   Lanier Prude, CMA(AAMA)   Authorized by:   Tresa Garter MD   Signed by:   Lanier Prude, [facility]) on 08/04/2010   Method used:   Electronically to        [facility] [place]       [address]       [place], [place]  [address]       Ph: [phone]       Fax: [phone]   RxID:   [id number] CRESTOR 40 MG TABS (ROSUVASTATIN CALCIUM) 1 by mouth once daily for cholesterol  #30 x 11   Entered and Authorized by:   Tresa Garter MD   Signed by:   Margaret Pyle, CMA on 08/04/2010   Method used:   Print then Give to Patient   RxID:   [id number]

## 2010-11-01 NOTE — Assessment & Plan Note (Signed)
Summary: 6 mos f/u #/cd   Vital Signs:  Patient profile:   60 year old male Height:      71 inches Weight:      242 pounds BMI:     33.87 O2 Sat:      98 % on Room air Temp:     97.3 degrees F oral Pulse rate:   70 / minute BP sitting:   140 / 80  (left arm) Cuff size:   large  Vitals Entered By: Lucious Groves (March 04, 2010 9:19 AM)  O2 Flow:  Room air CC: 6 mo f/u./kb Is Patient Diabetic? No Pain Assessment Patient in pain? no        Primary Care Provider:  Sonda Primes, MD  CC:  6 mo f/u./kb.  History of Present Illness: F/u high lipids, elev glu, obesity. He lost wt on diet. C/o ED.  Current Medications (verified): 1)  Enalapril Maleate 10 Mg  Tabs (Enalapril Maleate) .Marland Kitchen.. 1 Once Daily 2)  Crestor 10 Mg  Tabs (Rosuvastatin Calcium) .Marland Kitchen.. 1 Once Daily 3)  Diazepam 2 Mg  Tabs (Diazepam) .... 1/2-1 As Needed Qd 4)  Vitamin B-12 Cr 1000 Mcg  Tbcr (Cyanocobalamin) .... Take One Tablet By Mouth Daily 5)  Diclofenac Sodium 50 Mg  Tbec (Diclofenac Sodium) .Marland Kitchen.. 1 By Mouth Two Times A Day As Needed Pain 6)  Cialis 20 Mg Tabs (Tadalafil) .Marland Kitchen.. 1 Tablet Every Other Day As Needed 7)  Cpap Mask .... Use As Directed 8)  Cpap Machine Rental .... Use As Directed For 2 Weeks Until Personal Cpap Machine Arrives 9)  Levitra 20 Mg Tabs (Vardenafil Hcl) .Marland Kitchen.. 1 By Mouth Once Daily Prn 10)  Nexium 40 Mg Cpdr (Esomeprazole Magnesium) .Marland Kitchen.. 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         Lost wt on diet  Physical Exam  General:  Well-developed in no acute distress; alert,appropriate and cooperative throughout examination Less overweight-appearing.   Mouth:  Oral mucosa and oropharynx without lesions or exudates.  Teeth in good repair. Neck:  No deformities, masses, or tenderness noted. Lungs:  Normal respiratory effort, chest expands symmetrically. Lungs are clear to auscultation, no crackles or wheezes. Heart:  Normal rate and regular rhythm. S1 and S2 normal without gallop, murmur, click, rub or other extra sounds. Abdomen:  Bowel sounds positive,abdomen soft and non-tender without masses, organomegaly or hernias noted. Msk:  No deformity or scoliosis noted of thoracic or lumbar spine.   Neurologic:  No cranial nerve deficits noted. Station and gait are normal. Plantar reflexes are down-going bilaterally. DTRs are symmetrical throughout. Sensory, motor and coordinative functions appear intact. Skin:  WNL Psych:  Cognition and judgment appear intact. Alert and cooperative with normal attention span and concentration. No apparent delusions, illusions, hallucinations   Impression & Recommendations:  Problem # 1:  B12 DEFICIENCY (ICD-266.2) Assessment Unchanged On prescription drug  therapy   Problem # 2:  HYPERLIPIDEMIA (ICD-272.4) Assessment: Deteriorated  Risks of noncompliance with treatment  discussed. Compliance encouraged.  His updated medication list for this problem includes:    Crestor 10 Mg Tabs (Rosuvastatin calcium) .Marland Kitchen... 1 once daily  Labs Reviewed: SGOT: 30 (03/02/2010)   SGPT: 41 (03/02/2010)   HDL:55.60 (03/02/2010), 51.50 (08/09/2009)  LDL:129 (08/09/2009), 132 (08/07/2008)  Chol:217 (03/02/2010), 194 (08/09/2009)  Trig:164.0 (03/02/2010), 68.0 (08/09/2009)  Problem # 3:  ERECTILE DYSFUNCTION, ORGANIC (ICD-607.84) Assessment:  Unchanged  His updated medication list for this problem includes:    Cialis 20 Mg Tabs (Tadalafil) .Marland Kitchen... 1 tablet every other day as needed    Levitra 20 Mg Tabs (Vardenafil hcl) .Marland Kitchen... 1 by mouth once daily prn  Problem # 4:  HYPERTENSION (ICD-401.9) Assessment: Unchanged  His updated medication list for this problem includes:    Enalapril Maleate 10 Mg Tabs (Enalapril maleate) .Marland Kitchen... 1 once daily  BP today: 140/80 Prior BP: 140/86 (01/10/2010)  Labs Reviewed: K+: 4.1 (03/02/2010) Creat: : 1.0 (03/02/2010)   Chol: 217 (03/02/2010)   HDL: 55.60 (03/02/2010)   LDL: 129 (08/09/2009)   TG: 164.0 (03/02/2010)  Problem # 5:  HYPERGLYCEMIA (ICD-790.29) Assessment: Comment Only The labs were reviewed with the patient.   Problem # 6:  NECK PAIN (ICD-723.1) Assessment: Unchanged See "Patient Instructions".  His updated medication list for this problem includes:    Diclofenac Sodium 50 Mg Tbec (Diclofenac sodium) .Marland Kitchen... 1 by mouth two times a day as needed pain  Complete Medication List: 1)  Enalapril Maleate 10 Mg Tabs (Enalapril maleate) .Marland Kitchen.. 1 once daily 2)  Crestor 10 Mg Tabs (Rosuvastatin calcium) .Marland Kitchen.. 1 once daily 3)  Diazepam 2 Mg Tabs (Diazepam) .... 1/2-1 as needed qd 4)  Vitamin B-12 Cr 1000 Mcg Tbcr (Cyanocobalamin) .... Take one tablet by mouth daily 5)  Diclofenac Sodium 50 Mg Tbec (Diclofenac sodium) .Marland Kitchen.. 1 by mouth two times a day as needed pain 6)  Cialis 20 Mg Tabs (Tadalafil) .Marland Kitchen.. 1 tablet every other day as needed 7)  Cpap Mask  .... Use as directed 8)  Cpap Machine Rental  .... Use as directed for 2 weeks until personal cpap machine arrives 9)  Levitra 20 Mg Tabs (Vardenafil hcl) .Marland Kitchen.. 1 by mouth once daily prn 10)  Nexium 40 Mg Cpdr (Esomeprazole magnesium) .Marland Kitchen.. 1 by mouth qam ( medically necessary) 11)  Pennsaid 1.5 % Soln (Diclofenac sodium) .... 3-5 gtt on skin three times a day for pain  Patient Instructions: 1)  Please schedule a follow-up appointment in 6  months well w/labs. 2)  Contour pillow 3)  Heating pad - sock w/rice Prescriptions: CRESTOR 10 MG  TABS (ROSUVASTATIN CALCIUM) 1 once daily  #30 Tablet x 12   Entered and Authorized by:   Tresa Garter MD   Signed by:   Tresa Garter MD on 03/04/2010   Method used:   Print then Give to Patient   RxID:   [id number] LEVITRA 20 MG TABS (VARDENAFIL HCL) 1 by mouth once daily prn  #12 x 6   Entered and Authorized by:   Tresa Garter MD   Signed by:   Tresa Garter MD on 03/04/2010   Method used:   Print then Give to Patient   RxID:   [id number] PENNSAID 1.5 % SOLN (DICLOFENAC SODIUM) 3-5 gtt on skin three times a day for pain  #1 x 3   Entered and Authorized by:   Tresa Garter MD   Signed by:   Tresa Garter MD on 03/04/2010   Method used:  Print then Give to Patient   RxID:   [id number]

## 2010-11-01 NOTE — Assessment & Plan Note (Signed)
Summary: INDIGESTION? /NWS   Vital Signs:  Patient profile:   60 year old male Weight:      265 pounds BMI:     37.09 Temp:     98.5 degrees F oral Pulse rate:   81 / minute BP sitting:   112 / 66  (left arm)  Vitals Entered By: Tora Perches (December 28, 2009 3:59 PM) CC: indigestion Is Patient Diabetic? No   CC:  indigestion.  History of Present Illness: C/o indigestion, chocking sensation  and CP, acid. Tablet of ASA got stuck in the throat recentely. He laid off of his Prilosec lately; he put some wt on too. CP may last x hrs. C/o wt gain  Preventive Screening-Counseling & Management  Alcohol-Tobacco     Smoking Status: quit  Current Medications (verified): 1)  Enalapril Maleate 10 Mg  Tabs (Enalapril Maleate) .Marland Kitchen.. 1 Once Daily 2)  Crestor 10 Mg  Tabs (Rosuvastatin Calcium) .Marland Kitchen.. 1 Once Daily 3)  Prilosec 20 Mg  Cpdr (Omeprazole) .Marland Kitchen.. 1 Once Daily 4)  Diazepam 2 Mg  Tabs (Diazepam) .... 1/2-1 As Needed Qd 5)  Triamcinolone Acetonide 0.1 %  Oint (Triamcinolone Acetonide) .... Apply Two Times A Day As Needed 6)  Vitamin B-12 Cr 1000 Mcg  Tbcr (Cyanocobalamin) .... Take One Tablet By Mouth Daily 7)  Diclofenac Sodium 50 Mg  Tbec (Diclofenac Sodium) .Marland Kitchen.. 1 By Mouth Two Times A Day As Needed Pain 8)  Flonase 50 Mcg/act Susp (Fluticasone Propionate) .... Use 2 Spray in Each Nostril Daily 6 Month Follow-Up Is Due No Addtional Refills Until Appt 9)  Tamiflu 75 Mg Caps (Oseltamivir Phosphate) .... Take One Capsule By Mouth Twice A Day 10)  Cialis 20 Mg Tabs (Tadalafil) .Marland Kitchen.. 1 Tablet Every Other Day As Needed 11)  Cpap Mask .... Use As Directed 12)  Cpap Machine Rental .... Use As Directed For 2 Weeks Until Personal Cpap Machine Arrives 13)  Benadryl 25 Mg Tabs (Diphenhydramine Hcl) 14)  Loratadine 10 Mg  Tabs (Loratadine) .... Once Daily As Needed Allergies 15)  Levitra 20 Mg Tabs (Vardenafil Hcl) .Marland Kitchen.. 1 By Mouth Once Daily Prn  Allergies: 1)  Hydrocodone-Acetaminophen  (Hydrocodone-Acetaminophen) 2)  Lipitor (Atorvastatin Calcium)  Past History:  Family History: Last updated: 02/21/2008 Family History Hypertension  Social History: Last updated: 06/18/2007 Married Former Smoker Alcohol use-no  Past Medical History: Hyperlipidemia Hypertension Osteoarthritis Vit B12 def Allergic rhinitis GERD Dr Russella Dar Obesity  Review of Systems       The patient complains of weight gain, chest pain, and severe indigestion/heartburn.  The patient denies hoarseness, syncope, dyspnea on exertion, peripheral edema, prolonged cough, and abdominal pain.    Physical Exam  General:  Well-developed in no acute distress; alert,appropriate and cooperative throughout examination overweight-appearing.   Nose:  External nasal examination shows no deformity or inflammation. Nasal mucosa are pink and moist without lesions or exudates. Mouth:  Oral mucosa and oropharynx without lesions or exudates.  Teeth in good repair. Chest Wall:  No deformities, masses, tenderness or gynecomastia noted. Lungs:  Normal respiratory effort, chest expands symmetrically. Lungs are clear to auscultation, no crackles or wheezes. Heart:  Normal rate and regular rhythm. S1 and S2 normal without gallop, murmur, click, rub or other extra sounds. Abdomen:  Bowel sounds positive,abdomen soft and non-tender without masses, organomegaly or hernias noted. Pulses:  R and L carotid,radial,femoral,dorsalis pedis and posterior tibial pulses are full and equal bilaterally Extremities:  No clubbing, cyanosis, edema, or deformity noted with normal full  range of motion of all joints.   Neurologic:  No cranial nerve deficits noted. Station and gait are normal. Plantar reflexes are down-going bilaterally. DTRs are symmetrical throughout. Sensory, motor and coordinative functions appear intact. Skin:  WNL Psych:  Cognition and judgment appear intact. Alert and cooperative with normal attention span and  concentration. No apparent delusions, illusions, hallucinations   Impression & Recommendations:  Problem # 1:  CHEST PAIN (ICD-786.50) likely due to #2 Assessment New  Orders: EKG w/ Interpretation (93000) Gastroenterology Referral (GI) Cardiolite (Cardiolite)  Problem # 2:  DYSPHAGIA UNSPECIFIED (VWU-981.19) Assessment: New  Orders: Gastroenterology Referral (GI) Dr Russella Dar Nexium 40 mg bid  Problem # 3:  WEIGHT GAIN, ABNORMAL (ICD-783.1) Assessment: Deteriorated Discussed diet  Problem # 4:  HYPERTENSION (ICD-401.9) Assessment: Improved  His updated medication list for this problem includes:    Enalapril Maleate 10 Mg Tabs (Enalapril maleate) .Marland Kitchen... 1 once daily  BP today: 112/66 Prior BP: 132/90 (12/08/2009)  Labs Reviewed: K+: 4.2 (08/09/2009) Creat: : 1.0 (08/09/2009)   Chol: 194 (08/09/2009)   HDL: 51.50 (08/09/2009)   LDL: 129 (08/09/2009)   TG: 68.0 (08/09/2009)  Problem # 5:  GERD (ICD-530.81) Assessment: Deteriorated  The following medications were removed from the medication list:    Prilosec 20 Mg Cpdr (Omeprazole) .Marland Kitchen... 1 once daily His updated medication list for this problem includes:    Nexium 40 Mg Cpdr (Esomeprazole magnesium) .Marland Kitchen... 2 by mouth qam ( medically necessary)  Complete Medication List: 1)  Enalapril Maleate 10 Mg Tabs (Enalapril maleate) .Marland Kitchen.. 1 once daily 2)  Crestor 10 Mg Tabs (Rosuvastatin calcium) .Marland Kitchen.. 1 once daily 3)  Diazepam 2 Mg Tabs (Diazepam) .... 1/2-1 as needed qd 4)  Triamcinolone Acetonide 0.1 % Oint (Triamcinolone acetonide) .... Apply two times a day as needed 5)  Vitamin B-12 Cr 1000 Mcg Tbcr (Cyanocobalamin) .... Take one tablet by mouth daily 6)  Diclofenac Sodium 50 Mg Tbec (Diclofenac sodium) .Marland Kitchen.. 1 by mouth two times a day as needed pain 7)  Flonase 50 Mcg/act Susp (Fluticasone propionate) .... Use 2 spray in each nostril daily 6 month follow-up is due no addtional refills until appt 8)  Tamiflu 75 Mg Caps  (Oseltamivir phosphate) .... Take one capsule by mouth twice a day 9)  Cialis 20 Mg Tabs (Tadalafil) .Marland Kitchen.. 1 tablet every other day as needed 10)  Cpap Mask  .... Use as directed 11)  Cpap Machine Rental  .... Use as directed for 2 weeks until personal cpap machine arrives 12)  Benadryl 25 Mg Tabs (Diphenhydramine hcl) 13)  Loratadine 10 Mg Tabs (Loratadine) .... Once daily as needed allergies 14)  Levitra 20 Mg Tabs (Vardenafil hcl) .Marland Kitchen.. 1 by mouth once daily prn 15)  Nexium 40 Mg Cpdr (Esomeprazole magnesium) .... 2 by mouth qam ( medically necessary)  Patient Instructions: 1)  Please schedule a follow-up appointment in 3 months. 2)  Call if you are not better in a reasonable amount of time or if worse. Go to ER if feeling really bad!  Prescriptions: NEXIUM 40 MG CPDR (ESOMEPRAZOLE MAGNESIUM) 2 by mouth qam ( medically necessary)  #60 x 12   Entered and Authorized by:   Tresa Garter MD   Signed by:   Tresa Garter MD on 12/28/2009   Method used:   Print then Give to Patient   RxID:   [id number]

## 2010-11-01 NOTE — Progress Notes (Signed)
Summary: diazepam  Phone Note Refill Request Message from:  Fax from Pharmacy on Feb 24, 2010 4:20 PM  Refills Requested: Medication #1:  DIAZEPAM 2 MG  TABS 1/2-1 as needed qd   Last Refilled: 08/13/2009   Notes: #30   Is this ok to refill for the pt ([facility])  Initial call taken by: Rock Nephew CMA,  Feb 24, 2010 4:21 PM  Follow-up for Phone Call        ok 3 ref Follow-up by: Tresa Garter MD,  Feb 25, 2010 7:47 AM    Prescriptions: DIAZEPAM 2 MG  TABS (DIAZEPAM) 1/2-1 as needed qd  #30 x 2   Entered by:   Lamar Sprinkles, CMA   Authorized by:   Tresa Garter MD   Signed by:   Lamar Sprinkles, CMA on 02/25/2010   Method used:   Telephoned to ...       [facility]ail[facility]       [address]       [place], [place]  [address]       Ph: [phone]       Fax: [phone]   RxID:   [id number]

## 2010-11-01 NOTE — Progress Notes (Signed)
Summary: Triage   Phone Note Call from Patient Call back at Home Phone [phone]   Caller: Patient Call For: Dr. Russella Dar Reason for Call: Talk to Nurse Summary of Call: pt has an appt. 02-03-10 and would like a sooner appt. Feels like he has something "stuck in his throat"--saw Dr. Posey Rea yesterday and was given Nexium. Initial call taken by: Karna Christmas,  December 29, 2009 8:15 AM  Follow-up for Phone Call        Appt. moved up to 01/10/10 with PA as DR.Russella Dar is supervising dr. Follow-up by: Teryl Lucy RN,  December 29, 2009 8:46 AM

## 2010-11-03 NOTE — Progress Notes (Signed)
Summary: REQ FOR ZPAK  Phone Note Call from Patient   Summary of Call: Pt c/o sinus pressure & pain w/colored mucus req rx for zpak.  Initial call taken by: Lamar Sprinkles, CMA,  October 20, 2010 10:32 AM  Follow-up for Phone Call        ok Follow-up by: Tresa Garter MD,  October 20, 2010 6:24 PM    New/Updated Medications: ZITHROMAX Z-PAK 250 MG TABS (AZITHROMYCIN) as dirrected Prescriptions: ZITHROMAX Z-PAK 250 MG TABS (AZITHROMYCIN) as dirrected  #1 x 0   Entered and Authorized by:   Tresa Garter MD   Signed by:   Rock Nephew CMA on 10/21/2010   Method used:   Electronically to        [facility] ([facility]ail)       [address]       [place], [place]  [address]       Ph: [phone]       Fax: [phone]   RxID:   [id number]

## 2010-11-03 NOTE — Assessment & Plan Note (Signed)
Summary: CPX / NWS  #   Vital Signs:  Patient profile:   60 year old male Height:      71 inches Weight:      251 pounds BMI:     35.13 Temp:     98.8 degrees F oral Pulse rate:   76 / minute Pulse rhythm:   regular Resp:     16 per minute BP sitting:   124 / 80  (left arm) Cuff size:   regular  Vitals Entered By: Lanier Prude, CMA(AAMA) (October 12, 2010 3:12 PM) CC: CPX Is Patient Diabetic? No   Primary Care Provider:  Sonda Primes, MD  CC:  CPX.  History of Present Illness: The patient presents for a preventive health examination  C/o B knee pain L>R, ED  Preventive Screening-Counseling & Management  Alcohol-Tobacco     Feels need to cut down: yes     Feels annoyed by complaints: no     Feels guilty re: drinking: no     Needs 'eye opener' in am: no  Current Medications (verified): 1)  Enalapril Maleate 10 Mg  Tabs (Enalapril Maleate) .Marland Kitchen.. 1 Once Daily 2)  Diazepam 2 Mg  Tabs (Diazepam) .... 1/2-1 As Needed Qd 3)  Vitamin B-12 Cr 1000 Mcg  Tbcr (Cyanocobalamin) .... Take One Tablet By Mouth Daily 4)  Diclofenac Sodium 50 Mg  Tbec (Diclofenac Sodium) .Marland Kitchen.. 1 By Mouth Two Times A Day As Needed Pain 5)  Cialis 20 Mg Tabs (Tadalafil) .Marland Kitchen.. 1 Tablet Every Other Day As Needed 6)  Cpap Mask .... Use As Directed 7)  Cpap Machine Rental .... Use As Directed For 2 Weeks Until Personal Cpap Machine Arrives 8)  Levitra 20 Mg Tabs (Vardenafil Hcl) .Marland Kitchen.. 1 By Mouth Once Daily Prn 9)  Nexium 40 Mg Cpdr (Esomeprazole Magnesium) .Marland Kitchen.. 1 By Mouth Qam ( Medically Necessary) 10)  Pennsaid 1.5 % Soln (Diclofenac Sodium) .... 3-5 Gtt On Skin Three Times A Day For Pain 11)  Crestor 40 Mg Tabs (Rosuvastatin Calcium) .Marland Kitchen.. 1 By Mouth Once Daily For Cholesterol  Allergies (verified): 1)  Hydrocodone-Acetaminophen (Hydrocodone-Acetaminophen) 2)  Lipitor (Atorvastatin Calcium)  Past History:  Past Medical History: Last updated:  01/10/2010 Hyperlipidemia Hypertension Osteoarthritis Vit B12 def Allergic rhinitis GERD, Ulcerative esophagitis, Dr Russella Dar, 2006 Hyperplastic colon polyps, 2006 Obesity Sleep Apnea  Past Surgical History: Last updated: 01/10/2010 Arthroscopy Rt. knee  Family History: Last updated: 01/10/2010 Family History Hypertension Family History of Breast Cancer: mother  Social History: Last updated: 01/10/2010 Married (now separated) Former Smoker Alcohol 3-4 per day Occupation: Soil scientist Daily Caffeine Use  2-3 per day  Review of Systems       The patient complains of difficulty walking.  The patient denies anorexia, fever, weight loss, weight gain, vision loss, decreased hearing, hoarseness, chest pain, syncope, dyspnea on exertion, peripheral edema, prolonged cough, headaches, hemoptysis, abdominal pain, melena, hematochezia, severe indigestion/heartburn, hematuria, incontinence, genital sores, muscle weakness, suspicious skin lesions, transient blindness, depression, unusual weight change, abnormal bleeding, enlarged lymph nodes, angioedema, and breast masses.         ED  Physical Exam  General:  Well-developed in no acute distress; alert,appropriate and cooperative throughout examination He is  overweight-appearing.   Head:  Normocephalic and atraumatic without obvious abnormalities. No apparent alopecia or balding. Eyes:  No corneal or conjunctival inflammation noted. EOMI. Perrla. Ears:  External ear exam shows no significant lesions or deformities.  Otoscopic examination reveals clear canals, tympanic membranes are intact bilaterally without  bulging, retraction, inflammation or discharge. Hearing is grossly normal bilaterally. Nose:  External nasal examination shows no deformity or inflammation. Nasal mucosa are pink and moist without lesions or exudates. Mouth:  Oral mucosa and oropharynx without lesions or exudates.  Teeth in good repair. Neck:  No deformities, masses,  or tenderness noted. Lungs:  Normal respiratory effort, chest expands symmetrically. Lungs are clear to auscultation, no crackles or wheezes. Heart:  Normal rate and regular rhythm. S1 and S2 normal without gallop, murmur, click, rub or other extra sounds. Abdomen:  Bowel sounds positive,abdomen soft and non-tender without masses, organomegaly or hernias noted. Rectal:  No external abnormalities noted. Normal sphincter tone. No rectal masses or tenderness. G(-) Genitalia:  Testes bilaterally descended without nodularity, tenderness or masses. No scrotal masses or lesions. No penis lesions or urethral discharge. Prostate:  no nodules, no asymmetry, and 1+ enlarged.   Msk:  No deformity or scoliosis noted of thoracic or lumbar spine.  B knees tender w/ROM Extremities:  No clubbing, cyanosis, edema, or deformity noted with normal full range of motion of all joints.   Neurologic:  No cranial nerve deficits noted. Station and gait are normal. Plantar reflexes are down-going bilaterally. DTRs are symmetrical throughout. Sensory, motor and coordinative functions appear intact. Skin:  WNL Cervical Nodes:  No lymphadenopathy noted Inguinal Nodes:  No significant adenopathy Psych:  Cognition and judgment appear intact. Alert and cooperative with normal attention span and concentration. No apparent delusions, illusions, hallucinations   Impression & Recommendations:  Problem # 1:  HEALTH MAINTENANCE EXAM (ICD-V70.0) Assessment New  The labs were reviewed with the patient.  Health and age related issues were discussed. Available screening tests and vaccinations were discussed as well. Healthy life style including good diet and exercise was discussed.   Orders: Gastroenterology Referral (GI)  Problem # 2:  KNEE PAIN (ICD-719.46) R>L Assessment: Deteriorated Ortho for Sinvisc or TKR His updated medication list for this problem includes:    Diclofenac Sodium 50 Mg Tbec (Diclofenac sodium) .Marland Kitchen... 1 by  mouth two times a day as needed pain  Problem # 3:  HYPERGLYCEMIA (ICD-790.29) Assessment: Improved The labs were reviewed with the patient.   Problem # 4:  ERECTILE DYSFUNCTION, ORGANIC (ICD-607.84) Assessment: Deteriorated  His updated medication list for this problem includes:    Cialis 20 Mg Tabs (Tadalafil) .Marland Kitchen... 1 tablet every other day as needed    Levitra 20 Mg Tabs (Vardenafil hcl) .Marland Kitchen... 1 by mouth once daily prn  Problem # 5:  WEIGHT GAIN, ABNORMAL (ICD-783.1) Assessment: Deteriorated Discussed  Problem # 6:  HYPERTENSION (ICD-401.9) Assessment: Improved  His updated medication list for this problem includes:    Enalapril Maleate 10 Mg Tabs (Enalapril maleate) .Marland Kitchen... 1 once daily  BP today: 124/80 Prior BP: 140/80 (03/04/2010)  Labs Reviewed: K+: 4.3 (10/07/2010) Creat: : 1.0 (10/07/2010)   Chol: 207 (10/07/2010)   HDL: 35.10 (10/07/2010)   LDL: 129 (08/09/2009)   TG: 195.0 (10/07/2010)  Complete Medication List: 1)  Enalapril Maleate 10 Mg Tabs (Enalapril maleate) .Marland Kitchen.. 1 once daily 2)  Diazepam 2 Mg Tabs (Diazepam) .... 1/2-1 as needed qd 3)  Vitamin B-12 Cr 1000 Mcg Tbcr (Cyanocobalamin) .... Take one tablet by mouth daily 4)  Diclofenac Sodium 50 Mg Tbec (Diclofenac sodium) .Marland Kitchen.. 1 by mouth two times a day as needed pain 5)  Cialis 20 Mg Tabs (Tadalafil) .Marland Kitchen.. 1 tablet every other day as needed 6)  Cpap Mask  .... Use as directed 7)  Cpap Machine  Rental  .... Use as directed for 2 weeks until personal cpap machine arrives 8)  Levitra 20 Mg Tabs (Vardenafil hcl) .Marland Kitchen.. 1 by mouth once daily prn 9)  Nexium 40 Mg Cpdr (Esomeprazole magnesium) .Marland Kitchen.. 1 by mouth qam ( medically necessary) 10)  Pennsaid 1.5 % Soln (Diclofenac sodium) .... 3-5 gtt on skin three times a day for pain 11)  Crestor 40 Mg Tabs (Rosuvastatin calcium) .Marland Kitchen.. 1 by mouth once daily for cholesterol  Other Orders: EKG w/ Interpretation (93000)  Patient Instructions: 1)  Please schedule a follow-up  appointment in 4 months. 2)  BMP prior to visit, ICD-9: 3)  Hepatic Panel prior to visit, ICD-9: 4)  Lipid Panel prior to visit, ICD-9: 272.0 Prescriptions: CRESTOR 40 MG TABS (ROSUVASTATIN CALCIUM) 1 by mouth once daily for cholesterol  #30 x 12   Entered and Authorized by:   Tresa Garter MD   Signed by:   Tresa Garter MD on 10/12/2010   Method used:   Print then Give to Patient   RxID:   [id number] NEXIUM 40 MG CPDR (ESOMEPRAZOLE MAGNESIUM) 1 by mouth qam ( medically necessary)  #30 x 12   Entered and Authorized by:   Tresa Garter MD   Signed by:   Tresa Garter MD on 10/12/2010   Method used:   Print then Give to Patient   RxID:   [id number] LEVITRA 20 MG TABS (VARDENAFIL HCL) 1 by mouth once daily prn  #12 x 6   Entered and Authorized by:   Tresa Garter MD   Signed by:   Tresa Garter MD on 10/12/2010   Method used:   Print then Give to Patient   RxID:   [id number] DICLOFENAC SODIUM 50 MG  TBEC (DICLOFENAC SODIUM) 1 by mouth two times a day as needed pain  #60 x 6   Entered and Authorized by:   Tresa Garter MD   Signed by:   Tresa Garter MD on 10/12/2010   Method used:   Print then Give to Patient   RxID:   [id number] DIAZEPAM 2 MG  TABS (DIAZEPAM) 1/2-1 as needed qd  #30 x 2   Entered and Authorized by:   Tresa Garter MD   Signed by:   Tresa Garter MD on 10/12/2010   Method used:   Print then Give to Patient   RxID:   [id number] ENALAPRIL MALEATE 10 MG  TABS (ENALAPRIL MALEATE) 1 once daily  #30 x 11   Entered and Authorized by:   Tresa Garter MD   Signed by:   Tresa Garter MD on 10/12/2010   Method used:   Print then Give to Patient   RxID:   [id number]    Orders Added: 1)  EKG w/ Interpretation [93000] 2)  Gastroenterology Referral [GI] 3)  Est. Patient 40-64 years [[id number]]

## 2011-01-31 ENCOUNTER — Other Ambulatory Visit: Payer: Self-pay | Admitting: Internal Medicine

## 2011-02-01 NOTE — Telephone Encounter (Signed)
Med is not active. Ok to Rf?

## 2011-02-03 ENCOUNTER — Other Ambulatory Visit: Payer: Self-pay

## 2011-02-03 ENCOUNTER — Other Ambulatory Visit: Payer: Self-pay | Admitting: Internal Medicine

## 2011-02-03 DIAGNOSIS — E7800 Pure hypercholesterolemia, unspecified: Secondary | ICD-10-CM

## 2011-02-03 NOTE — Telephone Encounter (Signed)
OK to fill this prescription with additional refills x5 Thank you!  

## 2011-02-10 ENCOUNTER — Ambulatory Visit: Payer: Self-pay | Admitting: Internal Medicine

## 2011-02-16 ENCOUNTER — Other Ambulatory Visit: Payer: Self-pay | Admitting: Internal Medicine

## 2011-02-17 NOTE — Assessment & Plan Note (Signed)
[facility]                             PRIMARY CARE OFFICE NOTE   Zachary Valdez, Zachary Valdez                        MRN:          [id number]  DATE:06/15/2006                            DOB:          03/29/1951    The patient is a 60 year old male who presents for a wellness examination.   Past medical history, family history and social history as per May 03, 2005, note.   Medicines reviewed.  Allergies reviewed.   REVIEW OF SYSTEMS:  No chest pain or shortness of breath.  Gained some  weight.  Itching in the right ear.  The rest is negative.   PHYSICAL EXAMINATION:  VITAL SIGNS:  Blood pressure 132/82, pulse 81,  temperature is 98.4, weight 248 pounds.  GENERAL:  He looks well, in no acute distress.  HEENT:  Moist mucosa.  Right ear canal reveals some erythematous, scaly  rash.  NECK:  Supple, no thyromegaly, no bruit.  LUNGS:  Clear.  No wheeze or rales.  CARDIAC:  S1, S2, no murmur, no gallop.  ABDOMEN:  Soft, nontender, no organomegaly or mass felt.  EXTREMITIES:  Lower extremities without edema.  RECTAL:  Normal prostate, stool guaiac negative.  No masses.  No prostate  nodules.  NEUROLOGIC:  He is alert and oriented and cooperative.  Denies being  depressed.   Labs June 13, 2006:  CBC normal.  Glucose 114.  PSA normal.  Cholesterol 206, LDL 137.  EKG with normal sinus rhythm.   ASSESSMENT AND PLAN:  1. Normal well examination.  Age/health-related issues discussed.  Healthy      lifestyle discussed.  He needs to lose weight.  Repeat exam in 12      months.  2. Elevated MCV.  Reduce alcohol intake.  3. Related liver function tests.  Plan as per #2.  4. Dyslipidemia.  He can start taking Crestor every other day to see if      able to control with      lesser dose.  5. Right ear rash.  Prescribed triamcinolone 1% to use b.i.d. p.r.n.            ______________________________  Georgina Quint. Plotnikov, MD  AVP/MedQ  DD:   06/28/2006  DT:  06/30/2006  Job #:  [id number]

## 2011-03-24 ENCOUNTER — Ambulatory Visit: Payer: Self-pay | Admitting: Internal Medicine

## 2011-05-03 ENCOUNTER — Other Ambulatory Visit (INDEPENDENT_AMBULATORY_CARE_PROVIDER_SITE_OTHER): Payer: Self-pay

## 2011-05-03 DIAGNOSIS — E7800 Pure hypercholesterolemia, unspecified: Secondary | ICD-10-CM

## 2011-05-03 LAB — BASIC METABOLIC PANEL
Chloride: 105 mEq/L (ref 96–112)
GFR: 92.04 mL/min (ref >60.00)
Potassium: 4.4 mEq/L (ref 3.5–5.1)
Sodium: 140 mEq/L (ref 135–145)

## 2011-05-03 LAB — LIPID PANEL
Cholesterol: 185 mg/dL (ref 0–200)
LDL Cholesterol: 122 mg/dL — ABNORMAL HIGH (ref 0–99)
Total CHOL/HDL Ratio: 4
VLDL: 19.4 mg/dL (ref 0.0–40.0)

## 2011-05-04 LAB — HEPATIC FUNCTION PANEL
ALT: 41 U/L (ref 0–53)
AST: 32 U/L (ref 0–37)
Bilirubin, Direct: 0.1 mg/dL (ref 0.0–0.3)
Total Bilirubin: 0.9 mg/dL (ref 0.3–1.2)

## 2011-05-09 ENCOUNTER — Ambulatory Visit (INDEPENDENT_AMBULATORY_CARE_PROVIDER_SITE_OTHER): Payer: 59 | Admitting: Internal Medicine

## 2011-05-09 ENCOUNTER — Encounter: Payer: Self-pay | Admitting: Internal Medicine

## 2011-05-09 DIAGNOSIS — E785 Hyperlipidemia, unspecified: Secondary | ICD-10-CM

## 2011-05-09 DIAGNOSIS — M79672 Pain in left foot: Secondary | ICD-10-CM

## 2011-05-09 DIAGNOSIS — N529 Male erectile dysfunction, unspecified: Secondary | ICD-10-CM

## 2011-05-09 DIAGNOSIS — M79609 Pain in unspecified limb: Secondary | ICD-10-CM

## 2011-05-09 DIAGNOSIS — G4730 Sleep apnea, unspecified: Secondary | ICD-10-CM

## 2011-05-09 DIAGNOSIS — I10 Essential (primary) hypertension: Secondary | ICD-10-CM

## 2011-05-09 NOTE — Assessment & Plan Note (Signed)
Cont Rx 

## 2011-05-09 NOTE — Assessment & Plan Note (Signed)
[facility] Lab Results  Component Value Date   WBC 6.1 10/07/2010   HGB 15.7 10/07/2010   HCT 45.1 10/07/2010   PLT 144.0* 10/07/2010   CHOL 185 05/03/2011   TRIG 97.0 05/03/2011   HDL 43.40 05/03/2011   LDLDIRECT 146.2 10/07/2010   ALT 41 05/03/2011   AST 32 05/03/2011   NA 140 05/03/2011   K 4.4 05/03/2011   CL 105 05/03/2011   CREATININE 0.9 05/03/2011   BUN 20 05/03/2011   CO2 29 05/03/2011   TSH 2.69 10/07/2010   PSA 0.43 10/07/2010   HGBA1C 5.9 08/09/2009

## 2011-05-09 NOTE — Progress Notes (Signed)
  Subjective:    Patient ID: Zachary Valdez, male    DOB: 07-10-1951, 60 y.o.   MRN: [id number]  HPI  C/o L foot pain and numbness x mo F/u hyperlipidemia, ED, B knee OA, HTN  Review of Systems  Constitutional: Negative for appetite change, fatigue and unexpected weight change.  HENT: Negative for nosebleeds, congestion, sore throat, sneezing, trouble swallowing and neck pain.   Eyes: Negative for itching and visual disturbance.  Respiratory: Negative for cough.   Cardiovascular: Negative for chest pain, palpitations and leg swelling.  Gastrointestinal: Negative for nausea, diarrhea, blood in stool and abdominal distention.  Genitourinary: Negative for frequency and hematuria.  Musculoskeletal: Negative for back pain, joint swelling and gait problem.  Skin: Negative for rash.  Neurological: Positive for numbness (L midfoot numbness). Negative for dizziness, tremors, speech difficulty and weakness.  Psychiatric/Behavioral: Negative for sleep disturbance, dysphoric mood and agitation. The patient is nervous/anxious.        Objective:   Physical Exam  Constitutional: He is oriented to person, place, and time. He appears well-developed.  HENT:  Mouth/Throat: Oropharynx is clear and moist.  Eyes: Conjunctivae are normal. Pupils are equal, round, and reactive to light.  Neck: Normal range of motion. No JVD present. No thyromegaly present.  Cardiovascular: Normal rate, regular rhythm, normal heart sounds and intact distal pulses.  Exam reveals no gallop and no friction rub.   No murmur heard. Pulmonary/Chest: Effort normal and breath sounds normal. No respiratory distress. He has no wheezes. He has no rales. He exhibits no tenderness.  Abdominal: Soft. Bowel sounds are normal. He exhibits no distension and no mass. There is no tenderness. There is no rebound and no guarding.  Musculoskeletal: Normal range of motion. He exhibits no edema and no tenderness.       No foot swelling or pain at  the plantar aspects  Lymphadenopathy:    He has no cervical adenopathy.  Neurological: He is alert and oriented to person, place, and time. He has normal reflexes. No cranial nerve deficit. He exhibits normal muscle tone. Coordination normal.  Skin: Skin is warm and dry. No rash noted. No erythema.  Psychiatric: He has a normal mood and affect. His behavior is normal. Judgment and thought content normal.          Assessment & Plan:

## 2011-05-09 NOTE — Assessment & Plan Note (Signed)
On Rx 

## 2011-05-09 NOTE — Assessment & Plan Note (Signed)
Pod consult

## 2011-05-09 NOTE — Assessment & Plan Note (Signed)
Cont Rx BP Readings from Last 3 Encounters:  05/09/11 140/90  10/12/10 124/80  03/04/10 140/80

## 2011-08-26 ENCOUNTER — Other Ambulatory Visit: Payer: Self-pay | Admitting: Internal Medicine

## 2011-11-06 ENCOUNTER — Other Ambulatory Visit: Payer: Self-pay | Admitting: *Deleted

## 2011-11-06 DIAGNOSIS — Z0000 Encounter for general adult medical examination without abnormal findings: Secondary | ICD-10-CM

## 2011-11-06 DIAGNOSIS — Z0389 Encounter for observation for other suspected diseases and conditions ruled out: Secondary | ICD-10-CM

## 2011-11-07 ENCOUNTER — Other Ambulatory Visit (INDEPENDENT_AMBULATORY_CARE_PROVIDER_SITE_OTHER): Payer: 59

## 2011-11-07 DIAGNOSIS — Z0000 Encounter for general adult medical examination without abnormal findings: Secondary | ICD-10-CM

## 2011-11-07 DIAGNOSIS — Z0389 Encounter for observation for other suspected diseases and conditions ruled out: Secondary | ICD-10-CM

## 2011-11-07 LAB — LIPID PANEL
Cholesterol: 156 mg/dL (ref 0–200)
HDL: 42.4 mg/dL (ref >39.00)
LDL Cholesterol: 91 mg/dL (ref 0–99)
VLDL: 23 mg/dL (ref 0.0–40.0)

## 2011-11-07 LAB — URINALYSIS, ROUTINE W REFLEX MICROSCOPIC
Hgb urine dipstick: NEGATIVE
Ketones, ur: NEGATIVE
Leukocytes, UA: NEGATIVE
Urine Glucose: NEGATIVE
Urobilinogen, UA: 0.2 (ref 0.0–1.0)

## 2011-11-07 LAB — CBC WITH DIFFERENTIAL/PLATELET
Basophils Absolute: 0 10*3/uL (ref 0.0–0.1)
Eosinophils Relative: 5.1 % — ABNORMAL HIGH (ref 0.0–5.0)
HCT: 43.9 % (ref 39.0–52.0)
Hemoglobin: 15.3 g/dL (ref 13.0–17.0)
Lymphocytes Relative: 21 % (ref 12.0–46.0)
Lymphs Abs: 1.3 10*3/uL (ref 0.7–4.0)
Monocytes Relative: 9.8 % (ref 3.0–12.0)
Neutro Abs: 3.8 10*3/uL (ref 1.4–7.7)
RDW: 12.4 % (ref 11.5–14.6)
WBC: 6 10*3/uL (ref 4.5–10.5)

## 2011-11-07 LAB — HEPATIC FUNCTION PANEL
Albumin: 4.1 g/dL (ref 3.5–5.2)
Total Bilirubin: 0.9 mg/dL (ref 0.3–1.2)

## 2011-11-07 LAB — BASIC METABOLIC PANEL
BUN: 13 mg/dL (ref 6–23)
Calcium: 9.4 mg/dL (ref 8.4–10.5)
GFR: 86.32 mL/min (ref >60.00)
Glucose, Bld: 107 mg/dL — ABNORMAL HIGH (ref 70–99)
Sodium: 138 mEq/L (ref 135–145)

## 2011-11-07 LAB — TSH: TSH: 2.2 u[IU]/mL (ref 0.35–5.50)

## 2011-11-07 LAB — PSA: PSA: 0.58 ng/mL (ref 0.10–4.00)

## 2011-11-08 ENCOUNTER — Other Ambulatory Visit: Payer: 59

## 2011-11-10 ENCOUNTER — Encounter: Payer: Self-pay | Admitting: Internal Medicine

## 2011-11-10 ENCOUNTER — Ambulatory Visit (INDEPENDENT_AMBULATORY_CARE_PROVIDER_SITE_OTHER): Payer: 59 | Admitting: Internal Medicine

## 2011-11-10 VITALS — BP 130/80 | HR 80 | Temp 98.5°F | Resp 16 | Ht 72.0 in | Wt 246.0 lb

## 2011-11-10 DIAGNOSIS — I10 Essential (primary) hypertension: Secondary | ICD-10-CM

## 2011-11-10 MED ORDER — ESOMEPRAZOLE MAGNESIUM 40 MG PO CPDR: 40.0000 mg | DELAYED_RELEASE_CAPSULE | Freq: Every day | ORAL | Status: DC

## 2011-11-10 MED ORDER — DICLOFENAC SODIUM 50 MG PO TBEC: 50.0000 mg | DELAYED_RELEASE_TABLET | Freq: Two times a day (BID) | ORAL | Status: DC

## 2011-11-10 MED ORDER — TADALAFIL 20 MG PO TABS: 20.0000 mg | ORAL_TABLET | Freq: Every day | ORAL | Status: DC | PRN

## 2011-11-10 MED ORDER — TRIAMCINOLONE ACETONIDE 0.5 % EX CREA: TOPICAL_CREAM | Freq: Three times a day (TID) | CUTANEOUS | Status: AC

## 2011-11-10 MED ORDER — ENALAPRIL MALEATE 10 MG PO TABS: 10.0000 mg | ORAL_TABLET | Freq: Every day | ORAL | Status: DC

## 2011-11-10 MED ORDER — DIAZEPAM 2 MG PO TABS: 1.0000 mg | ORAL_TABLET | Freq: Every day | ORAL | Status: DC | PRN

## 2011-11-10 MED ORDER — ROSUVASTATIN CALCIUM 40 MG PO TABS: 40.0000 mg | ORAL_TABLET | Freq: Every day | ORAL | Status: DC

## 2011-11-10 MED ORDER — FLUTICASONE PROPIONATE 50 MCG/ACT NA SUSP: 2.0000 | Freq: Every day | NASAL | Status: DC

## 2011-11-10 MED ORDER — PANTOPRAZOLE SODIUM 40 MG PO TBEC: 40.0000 mg | DELAYED_RELEASE_TABLET | Freq: Every day | ORAL | Status: DC

## 2011-11-10 NOTE — Progress Notes (Signed)
  Subjective:    Patient ID: Zachary Valdez, male    DOB: 18-Apr-1951, 61 y.o.   MRN: [id number]  HPI  The patient is here for a wellness exam. The patient has been doing well overall without major physical or psychological issues going on lately. The patient needs to address  chronic hypertension that has been well controlled with medicines; to address chronic  hyperlipidemia controlled with medicines as well; and to address B12 def diabetes, controlled with medical treatment and diet.  Wt Readings from Last 3 Encounters:  11/10/11 246 lb (111.585 kg)  05/09/11 250 lb (113.399 kg)  10/12/10 251 lb (113.853 kg)     Review of Systems  Constitutional: Negative for appetite change, fatigue and unexpected weight change.  HENT: Negative for nosebleeds, congestion, sore throat, sneezing, trouble swallowing and neck pain.   Eyes: Negative for itching and visual disturbance.  Respiratory: Negative for cough.   Cardiovascular: Negative for chest pain, palpitations and leg swelling.  Gastrointestinal: Negative for nausea, diarrhea, blood in stool and abdominal distention.  Genitourinary: Negative for frequency and hematuria.  Musculoskeletal: Positive for arthralgias (knees hurt). Negative for back pain, joint swelling and gait problem.  Skin: Negative for rash.  Neurological: Negative for dizziness, tremors, speech difficulty and weakness.  Psychiatric/Behavioral: Negative for sleep disturbance, dysphoric mood and agitation. The patient is not nervous/anxious.        Objective:   Physical Exam  Constitutional: He is oriented to person, place, and time. He appears well-developed.       obese  HENT:  Mouth/Throat: Oropharynx is clear and moist.  Eyes: Conjunctivae are normal. Pupils are equal, round, and reactive to light.  Neck: Normal range of motion. No JVD present. No thyromegaly present.  Cardiovascular: Normal rate, regular rhythm, normal heart sounds and intact distal pulses.  Exam  reveals no gallop and no friction rub.   No murmur heard. Pulmonary/Chest: Effort normal and breath sounds normal. No respiratory distress. He has no wheezes. He has no rales. He exhibits no tenderness.  Abdominal: Soft. Bowel sounds are normal. He exhibits no distension and no mass. There is no tenderness. There is no rebound and no guarding.  Musculoskeletal: Normal range of motion. He exhibits no edema and no tenderness.  Lymphadenopathy:    He has no cervical adenopathy.  Neurological: He is alert and oriented to person, place, and time. He has normal reflexes. No cranial nerve deficit. He exhibits normal muscle tone. Coordination normal.  Skin: Skin is warm and dry. No rash noted.  Psychiatric: He has a normal mood and affect. His behavior is normal. Judgment and thought content normal.     Lab Results  Component Value Date   WBC 6.0 11/07/2011   HGB 15.3 11/07/2011   HCT 43.9 11/07/2011   PLT 156.0 11/07/2011   GLUCOSE 107* 11/07/2011   CHOL 156 11/07/2011   TRIG 115.0 11/07/2011   HDL 42.40 11/07/2011   LDLDIRECT 146.2 10/07/2010   LDLCALC 91 11/07/2011   ALT 47 11/07/2011   AST 33 11/07/2011   NA 138 11/07/2011   K 4.6 11/07/2011   CL 103 11/07/2011   CREATININE 1.0 11/07/2011   BUN 13 11/07/2011   CO2 28 11/07/2011   TSH 2.20 11/07/2011   PSA 0.58 11/07/2011   HGBA1C 5.9 08/09/2009        Assessment & Plan:

## 2011-11-11 ENCOUNTER — Encounter: Payer: Self-pay | Admitting: Internal Medicine

## 2012-01-19 ENCOUNTER — Other Ambulatory Visit: Payer: Self-pay | Admitting: Internal Medicine

## 2012-03-07 ENCOUNTER — Encounter: Payer: Self-pay | Admitting: Internal Medicine

## 2012-03-07 ENCOUNTER — Ambulatory Visit (INDEPENDENT_AMBULATORY_CARE_PROVIDER_SITE_OTHER): Payer: 59 | Admitting: Internal Medicine

## 2012-03-07 VITALS — BP 130/80 | HR 72 | Temp 97.9°F | Resp 16 | Wt 225.0 lb

## 2012-03-07 DIAGNOSIS — J019 Acute sinusitis, unspecified: Secondary | ICD-10-CM | POA: Insufficient documentation

## 2012-03-07 MED ORDER — AMOXICILLIN-POT CLAVULANATE 875-125 MG PO TABS: 1.0000 | ORAL_TABLET | Freq: Two times a day (BID) | ORAL | Status: AC

## 2012-03-07 NOTE — Assessment & Plan Note (Signed)
Augmentin.  ?Flonase.  ?

## 2012-03-07 NOTE — Progress Notes (Signed)
Patient ID: Zachary Valdez, male   DOB: Mar 22, 1951, 61 y.o.   MRN: [id number]  Subjective:    Patient ID: Zachary Valdez, male    DOB: [birth year], 61 y.o.   MRN: [id number]  Sinusitis This is a new problem. The current episode started 1 to 4 weeks ago. The problem has been gradually worsening since onset. There has been no fever. He is experiencing no pain. Pertinent negatives include no sneezing or sore throat.  C/o no sense of smell x 1 wk, he used a nasal decongestant tube    Wt Readings from Last 3 Encounters:  03/07/12 225 lb (102.059 kg)  11/10/11 246 lb (111.585 kg)  05/09/11 250 lb (113.399 kg)     Review of Systems  Constitutional: Negative for appetite change, fatigue and unexpected weight change.  HENT: Negative for nosebleeds, sore throat and sneezing.   Eyes: Negative for itching and visual disturbance.  Cardiovascular: Negative for chest pain, palpitations and leg swelling.  Gastrointestinal: Negative for nausea, blood in stool and abdominal distention.  Genitourinary: Negative for frequency and hematuria.  Musculoskeletal: Positive for arthralgias (knees hurt). Negative for back pain, joint swelling and gait problem.  Skin: Negative for rash.  Neurological: Negative for dizziness, tremors, speech difficulty and weakness.  Psychiatric/Behavioral: Negative for sleep disturbance, dysphoric mood and agitation. The patient is not nervous/anxious.        Objective:   Physical Exam  Constitutional: He is oriented to person, place, and time. He appears well-developed.       obese  HENT:  Mouth/Throat: Oropharynx is clear and moist.       Swollen nasal mucosa  Eyes: Conjunctivae are normal. Pupils are equal, round, and reactive to light.  Neck: Normal range of motion. No JVD present. No thyromegaly present.  Cardiovascular: Normal rate, regular rhythm, normal heart sounds and intact distal pulses.  Exam reveals no gallop and no friction rub.   No murmur  heard. Pulmonary/Chest: Effort normal and breath sounds normal. No respiratory distress. He has no wheezes. He has no rales. He exhibits no tenderness.  Abdominal: Soft. Bowel sounds are normal. He exhibits no distension and no mass. There is no tenderness. There is no rebound and no guarding.  Musculoskeletal: Normal range of motion. He exhibits no edema and no tenderness.  Lymphadenopathy:    He has no cervical adenopathy.  Neurological: He is alert and oriented to person, place, and time. He has normal reflexes. No cranial nerve deficit. He exhibits normal muscle tone. Coordination normal.  Skin: Skin is warm and dry. No rash noted.  Psychiatric: He has a normal mood and affect. His behavior is normal. Judgment and thought content normal.     Lab Results  Component Value Date   WBC 6.0 11/07/2011   HGB 15.3 11/07/2011   HCT 43.9 11/07/2011   PLT 156.0 11/07/2011   GLUCOSE 107* 11/07/2011   CHOL 156 11/07/2011   TRIG 115.0 11/07/2011   HDL 42.40 11/07/2011   LDLDIRECT 146.2 10/07/2010   LDLCALC 91 11/07/2011   ALT 47 11/07/2011   AST 33 11/07/2011   NA 138 11/07/2011   K 4.6 11/07/2011   CL 103 11/07/2011   CREATININE 1.0 11/07/2011   BUN 13 11/07/2011   CO2 28 11/07/2011   TSH 2.20 11/07/2011   PSA 0.58 11/07/2011   HGBA1C 5.9 08/09/2009        Assessment & Plan:

## 2012-04-15 ENCOUNTER — Telehealth: Payer: Self-pay | Admitting: Internal Medicine

## 2012-04-15 DIAGNOSIS — R430 Anosmia: Secondary | ICD-10-CM

## 2012-04-15 DIAGNOSIS — J329 Chronic sinusitis, unspecified: Secondary | ICD-10-CM

## 2012-04-15 NOTE — Telephone Encounter (Signed)
We will sck sinus CT OV w/results Thx

## 2012-04-15 NOTE — Telephone Encounter (Signed)
Caller: Vera/Patient; PCP: Plotnikov, Alex; CB#: [phone]; ; ; Call regarding Sinus Infection in June; On Atbx X. 2. Weeks, Sx Remain;  Loss of smell that he reported to provider at office visit 03/07/12. Emergent sx ruled out.  Home care and follow up w/ provider within 72 hours per [facility] protocol.   Appt. at 1630 on 04/16/12.

## 2012-04-15 NOTE — Telephone Encounter (Signed)
Pt informed

## 2012-04-16 ENCOUNTER — Ambulatory Visit: Payer: 59 | Admitting: Internal Medicine

## 2012-04-19 ENCOUNTER — Ambulatory Visit (INDEPENDENT_AMBULATORY_CARE_PROVIDER_SITE_OTHER)
Admission: RE | Admit: 2012-04-19 | Discharge: 2012-04-19 | Disposition: A | Discharge status: Home / Self Care | Payer: 59 | Source: Ambulatory Visit | Attending: Internal Medicine | Admitting: Internal Medicine

## 2012-04-19 ENCOUNTER — Telehealth: Payer: Self-pay | Admitting: Internal Medicine

## 2012-04-19 DIAGNOSIS — R430 Anosmia: Secondary | ICD-10-CM

## 2012-04-19 DIAGNOSIS — J329 Chronic sinusitis, unspecified: Secondary | ICD-10-CM

## 2012-04-19 DIAGNOSIS — R439 Unspecified disturbances of smell and taste: Secondary | ICD-10-CM

## 2012-04-19 MED ORDER — AMOXICILLIN-POT CLAVULANATE 875-125 MG PO TABS: 1.0000 | ORAL_TABLET | Freq: Two times a day (BID) | ORAL | Status: AC

## 2012-04-19 NOTE — Telephone Encounter (Signed)
Misty Stanley, please, inform patient that his sinus CTshows severe sinusitis Cont Rx  Keep ROV  Thx

## 2012-04-19 NOTE — Telephone Encounter (Signed)
Take Augmentin The "taste problem" should improve in a few days Thx

## 2012-04-19 NOTE — Telephone Encounter (Signed)
Pt informed. He doesn't have an abx. Will you give him one? And also, he states he still has "bad, sweet taste" and wants something for this as well. Please advise.

## 2012-04-19 NOTE — Telephone Encounter (Signed)
Pt informed

## 2012-04-22 ENCOUNTER — Other Ambulatory Visit: Payer: 59

## 2012-05-09 ENCOUNTER — Other Ambulatory Visit (INDEPENDENT_AMBULATORY_CARE_PROVIDER_SITE_OTHER): Payer: 59

## 2012-05-09 DIAGNOSIS — I10 Essential (primary) hypertension: Secondary | ICD-10-CM

## 2012-05-09 LAB — BASIC METABOLIC PANEL
BUN: 18 mg/dL (ref 6–23)
Chloride: 105 mEq/L (ref 96–112)
GFR: 99.32 mL/min (ref >60.00)
Potassium: 4.4 mEq/L (ref 3.5–5.1)
Sodium: 138 mEq/L (ref 135–145)

## 2012-05-10 ENCOUNTER — Encounter: Payer: Self-pay | Admitting: Internal Medicine

## 2012-05-10 ENCOUNTER — Ambulatory Visit (INDEPENDENT_AMBULATORY_CARE_PROVIDER_SITE_OTHER): Payer: 59 | Admitting: Internal Medicine

## 2012-05-10 ENCOUNTER — Other Ambulatory Visit (INDEPENDENT_AMBULATORY_CARE_PROVIDER_SITE_OTHER): Payer: 59

## 2012-05-10 VITALS — BP 130/98 | HR 84 | Temp 98.5°F | Resp 16 | Wt 224.0 lb

## 2012-05-10 DIAGNOSIS — J019 Acute sinusitis, unspecified: Secondary | ICD-10-CM

## 2012-05-10 DIAGNOSIS — G4730 Sleep apnea, unspecified: Secondary | ICD-10-CM

## 2012-05-10 DIAGNOSIS — R209 Unspecified disturbances of skin sensation: Secondary | ICD-10-CM

## 2012-05-10 DIAGNOSIS — R439 Unspecified disturbances of smell and taste: Secondary | ICD-10-CM

## 2012-05-10 DIAGNOSIS — E538 Deficiency of other specified B group vitamins: Secondary | ICD-10-CM

## 2012-05-10 DIAGNOSIS — I10 Essential (primary) hypertension: Secondary | ICD-10-CM

## 2012-05-10 DIAGNOSIS — R202 Paresthesia of skin: Secondary | ICD-10-CM

## 2012-05-10 DIAGNOSIS — R430 Anosmia: Secondary | ICD-10-CM | POA: Insufficient documentation

## 2012-05-10 DIAGNOSIS — J309 Allergic rhinitis, unspecified: Secondary | ICD-10-CM

## 2012-05-10 LAB — VITAMIN B12: Vitamin B-12: 748 pg/mL (ref 211–911)

## 2012-05-10 MED ORDER — LORATADINE 10 MG PO TABS: 10.0000 mg | ORAL_TABLET | Freq: Every day | ORAL | Status: DC

## 2012-05-10 MED ORDER — METHYLPREDNISOLONE ACETATE 80 MG/ML IJ SUSP
120.0000 mg | Freq: Once | INTRAMUSCULAR | Status: AC
Start: 2012-05-10 — End: 2012-05-10
  Administered 2012-05-10: 120 mg via INTRAMUSCULAR

## 2012-05-10 NOTE — Assessment & Plan Note (Signed)
Cont Augmentin Depomedr 120 mg IM Lorat 10 mg/d

## 2012-05-10 NOTE — Assessment & Plan Note (Signed)
On CPAP. ?

## 2012-05-10 NOTE — Assessment & Plan Note (Signed)
claritin labs

## 2012-05-10 NOTE — Assessment & Plan Note (Signed)
Treat sinusitis 

## 2012-05-10 NOTE — Assessment & Plan Note (Signed)
Continue with current prescription therapy as reflected on the Med list.  

## 2012-05-10 NOTE — Assessment & Plan Note (Signed)
Continue with current prescription therapy as reflected on the Med list. Labs  

## 2012-05-10 NOTE — Progress Notes (Signed)
Subjective:    Patient ID: Zachary Valdez, male    DOB: July 08, 1951, 61 y.o.   MRN: [id number]  Sinusitis This is a new problem. The current episode started 1 to 4 weeks ago. The problem has been gradually improving since onset. There has been no fever. He is experiencing no pain. Associated symptoms include congestion. Pertinent negatives include no sneezing or sore throat. The treatment provided mild relief.  C/o no sense of smell x 1 mo F/u HTN   BP Readings from Last 3 Encounters:  05/10/12 130/98  03/07/12 130/80  11/10/11 130/80     Wt Readings from Last 3 Encounters:  05/10/12 224 lb (101.606 kg)  03/07/12 225 lb (102.059 kg)  11/10/11 246 lb (111.585 kg)     Review of Systems  Constitutional: Negative for appetite change, fatigue and unexpected weight change.  HENT: Positive for congestion. Negative for nosebleeds, sore throat, sneezing and postnasal drip.   Eyes: Negative for itching and visual disturbance.  Cardiovascular: Negative for chest pain, palpitations and leg swelling.  Gastrointestinal: Negative for nausea, blood in stool and abdominal distention.  Genitourinary: Negative for frequency and hematuria.  Musculoskeletal: Positive for arthralgias (knees hurt). Negative for back pain, joint swelling and gait problem.  Skin: Negative for rash.  Neurological: Negative for dizziness, tremors, speech difficulty and weakness.  Psychiatric/Behavioral: Negative for disturbed wake/sleep cycle, dysphoric mood and agitation. The patient is not nervous/anxious.        Objective:   Physical Exam  Constitutional: He is oriented to person, place, and time. He appears well-developed.       obese  HENT:  Mouth/Throat: Oropharynx is clear and moist.       Swollen nasal mucosa  Eyes: Conjunctivae are normal. Pupils are equal, round, and reactive to light.  Neck: Normal range of motion. No JVD present. No thyromegaly present.  Cardiovascular: Normal rate, regular rhythm,  normal heart sounds and intact distal pulses.  Exam reveals no gallop and no friction rub.   No murmur heard. Pulmonary/Chest: Effort normal and breath sounds normal. No respiratory distress. He has no wheezes. He has no rales. He exhibits no tenderness.  Abdominal: Soft. Bowel sounds are normal. He exhibits no distension and no mass. There is no tenderness. There is no rebound and no guarding.  Musculoskeletal: Normal range of motion. He exhibits no edema and no tenderness.  Lymphadenopathy:    He has no cervical adenopathy.  Neurological: He is alert and oriented to person, place, and time. He has normal reflexes. No cranial nerve deficit. He exhibits normal muscle tone. Coordination normal.  Skin: Skin is warm and dry. No rash noted.  Psychiatric: He has a normal mood and affect. His behavior is normal. Judgment and thought content normal.     Lab Results  Component Value Date   WBC 6.0 11/07/2011   HGB 15.3 11/07/2011   HCT 43.9 11/07/2011   PLT 156.0 11/07/2011   GLUCOSE 97 05/09/2012   CHOL 156 11/07/2011   TRIG 115.0 11/07/2011   HDL 42.40 11/07/2011   LDLDIRECT 146.2 10/07/2010   LDLCALC 91 11/07/2011   ALT 47 11/07/2011   AST 33 11/07/2011   NA 138 05/09/2012   K 4.4 05/09/2012   CL 105 05/09/2012   CREATININE 0.8 05/09/2012   BUN 18 05/09/2012   CO2 28 05/09/2012   TSH 2.20 11/07/2011   PSA 0.58 11/07/2011   HGBA1C 5.9 08/09/2009        Assessment & Plan:

## 2012-05-15 LAB — ALLERGY PROFILE REGION II-DC, DE, MD, ~~LOC~~, VA
Allergen, D pternoyssinus,d7: <0.1 kU/L
Aspergillus fumigatus, IgG: <0.1 kU/L
Box Elder IgE: <0.1 kU/L
Cladosporium Herbarum: <0.1 kU/L
D. farinae: <0.1 kU/L
Lamb's Quarters: <0.1 kU/L
Oak: <0.1 kU/L

## 2012-05-20 LAB — ALLERGEN FOOD PROFILE SPECIFIC IGE
Egg White IgE: <0.1 kU/L
Fish Cod: <0.1 kU/L
Milk IgE: <0.1 kU/L
Peanut IgE: <0.1 kU/L
Shrimp IgE: <0.1 kU/L
Tuna IgE: <0.1 kU/L
Wheat IgE: <0.1 kU/L

## 2012-07-18 ENCOUNTER — Encounter: Payer: Self-pay | Admitting: Internal Medicine

## 2012-07-18 ENCOUNTER — Ambulatory Visit (INDEPENDENT_AMBULATORY_CARE_PROVIDER_SITE_OTHER): Payer: 59 | Admitting: Internal Medicine

## 2012-07-18 VITALS — BP 146/96 | HR 76 | Temp 98.6°F | Resp 16 | Wt 223.0 lb

## 2012-07-18 DIAGNOSIS — I10 Essential (primary) hypertension: Secondary | ICD-10-CM

## 2012-07-18 DIAGNOSIS — R079 Chest pain, unspecified: Secondary | ICD-10-CM

## 2012-07-18 DIAGNOSIS — E538 Deficiency of other specified B group vitamins: Secondary | ICD-10-CM

## 2012-07-18 MED ORDER — VARDENAFIL HCL 20 MG PO TABS: 20.0000 mg | ORAL_TABLET | Freq: Every day | ORAL | Status: DC | PRN

## 2012-07-18 MED ORDER — DICLOFENAC SODIUM 1 % TD GEL: 4.0000 g | Freq: Four times a day (QID) | TRANSDERMAL | Status: DC

## 2012-07-18 MED ORDER — ACYCLOVIR 800 MG PO TABS: 800.0000 mg | ORAL_TABLET | Freq: Every day | ORAL | Status: DC

## 2012-07-18 MED ORDER — IBUPROFEN 600 MG PO TABS: 600.0000 mg | ORAL_TABLET | Freq: Three times a day (TID) | ORAL | Status: DC | PRN

## 2012-07-18 NOTE — Assessment & Plan Note (Signed)
Ibuprofen Rx Acyclovir if rash

## 2012-07-18 NOTE — Assessment & Plan Note (Signed)
Continue with current prescription therapy as reflected on the Med list.  

## 2012-07-22 NOTE — Progress Notes (Signed)
   Subjective:    Patient ID: Zachary Valdez, male    DOB: 23-Jun-1951, 61 y.o.   MRN: [id number]  HPI C/o R shouldr blade burning pain x 2-3 wks - not better. No rash... F/u on no sense of smell - returned after sinusitis rx F/u HTN   BP Readings from Last 3 Encounters:  07/18/12 146/96  05/10/12 130/98  03/07/12 130/80     Wt Readings from Last 3 Encounters:  07/18/12 223 lb (101.152 kg)  05/10/12 224 lb (101.606 kg)  03/07/12 225 lb (102.059 kg)     Review of Systems  Constitutional: Negative for appetite change, fatigue and unexpected weight change.  HENT: Negative for nosebleeds and postnasal drip.   Eyes: Negative for itching and visual disturbance.  Cardiovascular: Negative for chest pain, palpitations and leg swelling.  Gastrointestinal: Negative for nausea, blood in stool and abdominal distention.  Genitourinary: Negative for frequency and hematuria.  Musculoskeletal: Positive for back pain (R shoulder blade) and arthralgias (knees hurt). Negative for joint swelling and gait problem.  Skin: Negative for rash.  Neurological: Negative for dizziness, tremors, speech difficulty and weakness.  Psychiatric/Behavioral: Negative for disturbed wake/sleep cycle, dysphoric mood and agitation. The patient is not nervous/anxious.        Objective:   Physical Exam  Constitutional: He is oriented to person, place, and time. He appears well-developed.       obese  HENT:  Mouth/Throat: Oropharynx is clear and moist.       nl nasal mucosa  Eyes: Conjunctivae normal are normal. Pupils are equal, round, and reactive to light.  Neck: Normal range of motion. No JVD present. No thyromegaly present.  Cardiovascular: Normal rate, regular rhythm, normal heart sounds and intact distal pulses.  Exam reveals no gallop and no friction rub.   No murmur heard. Pulmonary/Chest: Effort normal and breath sounds normal. No respiratory distress. He has no wheezes. He has no rales. He exhibits no  tenderness.  Abdominal: Soft. Bowel sounds are normal. He exhibits no distension and no mass. There is no tenderness. There is no rebound and no guarding.  Musculoskeletal: Normal range of motion. He exhibits no edema and no tenderness.  Lymphadenopathy:    He has no cervical adenopathy.  Neurological: He is alert and oriented to person, place, and time. He has normal reflexes. No cranial nerve deficit. He exhibits normal muscle tone. Coordination normal.  Skin: Skin is warm and dry. No rash noted.  Psychiatric: He has a normal mood and affect. His behavior is normal. Judgment and thought content normal.  chest wall NT   Lab Results  Component Value Date   WBC 6.0 11/07/2011   HGB 15.3 11/07/2011   HCT 43.9 11/07/2011   PLT 156.0 11/07/2011   GLUCOSE 97 05/09/2012   CHOL 156 11/07/2011   TRIG 115.0 11/07/2011   HDL 42.40 11/07/2011   LDLDIRECT 146.2 10/07/2010   LDLCALC 91 11/07/2011   ALT 47 11/07/2011   AST 33 11/07/2011   NA 138 05/09/2012   K 4.4 05/09/2012   CL 105 05/09/2012   CREATININE 0.8 05/09/2012   BUN 18 05/09/2012   CO2 28 05/09/2012   TSH 2.20 11/07/2011   PSA 0.58 11/07/2011   HGBA1C 5.9 08/09/2009        Assessment & Plan:

## 2012-07-22 NOTE — Assessment & Plan Note (Signed)
Resolved

## 2012-08-12 ENCOUNTER — Ambulatory Visit: Payer: 59 | Admitting: Internal Medicine

## 2012-11-08 ENCOUNTER — Other Ambulatory Visit: Payer: Self-pay | Admitting: *Deleted

## 2012-11-08 ENCOUNTER — Other Ambulatory Visit (INDEPENDENT_AMBULATORY_CARE_PROVIDER_SITE_OTHER): Payer: 59

## 2012-11-08 DIAGNOSIS — Z0000 Encounter for general adult medical examination without abnormal findings: Secondary | ICD-10-CM

## 2012-11-08 DIAGNOSIS — Z0389 Encounter for observation for other suspected diseases and conditions ruled out: Secondary | ICD-10-CM

## 2012-11-08 LAB — URINALYSIS, ROUTINE W REFLEX MICROSCOPIC
Bilirubin Urine: NEGATIVE
Hgb urine dipstick: NEGATIVE
Ketones, ur: NEGATIVE
Leukocytes, UA: NEGATIVE
Nitrite: NEGATIVE
Specific Gravity, Urine: 1.01
Total Protein, Urine: NEGATIVE
Urine Glucose: NEGATIVE
Urobilinogen, UA: 0.2
pH: 7 (ref 5.0–8.0)

## 2012-11-08 LAB — BASIC METABOLIC PANEL
CO2: 30 mEq/L (ref 19–32)
Glucose, Bld: 99 mg/dL (ref 70–99)
Potassium: 4.6 mEq/L (ref 3.5–5.1)
Sodium: 139 mEq/L (ref 135–145)

## 2012-11-08 LAB — CBC WITH DIFFERENTIAL/PLATELET
Basophils Absolute: 0 10*3/uL (ref 0.0–0.1)
Eosinophils Absolute: 0.3 10*3/uL (ref 0.0–0.7)
HCT: 44.5 % (ref 39.0–52.0)
Hemoglobin: 15.1 g/dL (ref 13.0–17.0)
Lymphocytes Relative: 24.3 % (ref 12.0–46.0)
Lymphs Abs: 1.3 10*3/uL (ref 0.7–4.0)
MCHC: 34 g/dL (ref 30.0–36.0)
Neutro Abs: 3.1 10*3/uL (ref 1.4–7.7)
RDW: 11.9 % (ref 11.5–14.6)

## 2012-11-08 LAB — HEPATIC FUNCTION PANEL
ALT: 30 U/L (ref 0–53)
AST: 22 U/L (ref 0–37)
Albumin: 4 g/dL (ref 3.5–5.2)
Alkaline Phosphatase: 49 U/L (ref 39–117)
Total Protein: 6.8 g/dL (ref 6.0–8.3)

## 2012-11-11 ENCOUNTER — Ambulatory Visit (INDEPENDENT_AMBULATORY_CARE_PROVIDER_SITE_OTHER): Payer: 59 | Admitting: Internal Medicine

## 2012-11-11 ENCOUNTER — Encounter: Payer: Self-pay | Admitting: Internal Medicine

## 2012-11-11 VITALS — BP 120/74 | HR 80 | Temp 97.4°F | Resp 12 | Ht 71.5 in | Wt 226.0 lb

## 2012-11-11 DIAGNOSIS — R635 Abnormal weight gain: Secondary | ICD-10-CM

## 2012-11-11 DIAGNOSIS — I10 Essential (primary) hypertension: Secondary | ICD-10-CM

## 2012-11-11 DIAGNOSIS — E538 Deficiency of other specified B group vitamins: Secondary | ICD-10-CM

## 2012-11-11 DIAGNOSIS — Z0000 Encounter for general adult medical examination without abnormal findings: Secondary | ICD-10-CM

## 2012-11-11 MED ORDER — DIAZEPAM 2 MG PO TABS: 1.0000 mg | ORAL_TABLET | Freq: Every day | ORAL | Status: DC | PRN

## 2012-11-11 MED ORDER — ROSUVASTATIN CALCIUM 40 MG PO TABS: 40.0000 mg | ORAL_TABLET | Freq: Every day | ORAL | Status: DC

## 2012-11-11 MED ORDER — LORATADINE 10 MG PO TABS: 10.0000 mg | ORAL_TABLET | Freq: Every day | ORAL | Status: DC

## 2012-11-11 MED ORDER — ENALAPRIL MALEATE 10 MG PO TABS: 10.0000 mg | ORAL_TABLET | Freq: Every day | ORAL | Status: DC

## 2012-11-11 MED ORDER — FLUTICASONE PROPIONATE 50 MCG/ACT NA SUSP: 2.0000 | Freq: Every day | NASAL | Status: DC

## 2012-11-11 MED ORDER — DICLOFENAC SODIUM 50 MG PO TBEC: 50.0000 mg | DELAYED_RELEASE_TABLET | Freq: Two times a day (BID) | ORAL | Status: DC | PRN

## 2012-11-11 MED ORDER — VARDENAFIL HCL 20 MG PO TABS: 20.0000 mg | ORAL_TABLET | Freq: Every day | ORAL | Status: DC | PRN

## 2012-11-11 MED ORDER — DICLOFENAC SODIUM 1 % TD GEL: 4.0000 g | Freq: Four times a day (QID) | TRANSDERMAL | Status: DC

## 2012-11-11 MED ORDER — AMOXICILLIN 500 MG PO CAPS: 1000.0000 mg | ORAL_CAPSULE | Freq: Two times a day (BID) | ORAL | Status: DC

## 2012-11-11 NOTE — Progress Notes (Signed)
   Subjective:  HPI  The patient is here for a wellness exam.  The patient has been doing well overall without major physical or psychological issues going on lately, except for sinusitis sx's The patient needs to address  chronic hypertension that has been well controlled with medicines; to address chronic  hyperlipidemia controlled with medicines as well; and to address B12 def, diabetes, controlled with medical treatment and diet.   Wt Readings from Last 3 Encounters:  11/11/12 226 lb (102.513 kg)  07/18/12 223 lb (101.152 kg)  05/10/12 224 lb (101.606 kg)   BP Readings from Last 3 Encounters:  11/11/12 120/74  07/18/12 146/96  05/10/12 130/98     Review of Systems  Constitutional: Negative for appetite change, fatigue and unexpected weight change.  HENT: Negative for nosebleeds, congestion, sore throat, sneezing, trouble swallowing and neck pain.   Eyes: Negative for itching and visual disturbance.  Respiratory: Negative for cough.   Cardiovascular: Negative for chest pain, palpitations and leg swelling.  Gastrointestinal: Negative for nausea, diarrhea, blood in stool and abdominal distention.  Genitourinary: Negative for frequency and hematuria.  Musculoskeletal: Positive for arthralgias (knees hurt). Negative for back pain, joint swelling and gait problem.  Skin: Negative for rash.  Neurological: Negative for dizziness, tremors, speech difficulty and weakness.  Psychiatric/Behavioral: Negative for sleep disturbance, dysphoric mood and agitation. The patient is not nervous/anxious.        Objective:   Physical Exam  Constitutional: He is oriented to person, place, and time. He appears well-developed.  obese  HENT:  Mouth/Throat: Oropharynx is clear and moist.  Eyes: Conjunctivae are normal. Pupils are equal, round, and reactive to light.  Neck: Normal range of motion. No JVD present. No thyromegaly present.  Cardiovascular: Normal rate, regular rhythm, normal heart  sounds and intact distal pulses.  Exam reveals no gallop and no friction rub.   No murmur heard. Pulmonary/Chest: Effort normal and breath sounds normal. No respiratory distress. He has no wheezes. He has no rales. He exhibits no tenderness.  Abdominal: Soft. Bowel sounds are normal. He exhibits no distension and no mass. There is no tenderness. There is no rebound and no guarding.  Genitourinary: Rectum normal, prostate normal and penis normal. Guaiac negative stool.  Musculoskeletal: Normal range of motion. He exhibits no edema and no tenderness.  Lymphadenopathy:    He has no cervical adenopathy.  Neurological: He is alert and oriented to person, place, and time. He has normal reflexes. No cranial nerve deficit. He exhibits normal muscle tone. Coordination normal.  Skin: Skin is warm and dry. No rash noted.  Psychiatric: He has a normal mood and affect. His behavior is normal. Judgment and thought content normal.     Lab Results  Component Value Date   WBC 5.2 11/08/2012   HGB 15.1 11/08/2012   HCT 44.5 11/08/2012   PLT 154.0 11/08/2012   GLUCOSE 99 11/08/2012   CHOL 168 11/08/2012   TRIG 122.0 11/08/2012   HDL 45.10 11/08/2012   LDLDIRECT 146.2 10/07/2010   LDLCALC 99 11/08/2012   ALT 30 11/08/2012   AST 22 11/08/2012   NA 139 11/08/2012   K 4.6 11/08/2012   CL 103 11/08/2012   CREATININE 1.0 11/08/2012   BUN 15 11/08/2012   CO2 30 11/08/2012   TSH 2.60 11/08/2012   PSA 0.57 11/08/2012   HGBA1C 5.9 08/09/2009        Assessment & Plan:

## 2012-11-15 ENCOUNTER — Other Ambulatory Visit: Payer: Self-pay | Admitting: Internal Medicine

## 2012-11-16 DIAGNOSIS — Z0000 Encounter for general adult medical examination without abnormal findings: Secondary | ICD-10-CM | POA: Insufficient documentation

## 2012-11-16 NOTE — Assessment & Plan Note (Signed)
Continue with current prescription therapy as reflected on the Med list.  

## 2012-11-16 NOTE — Assessment & Plan Note (Signed)
We discussed age appropriate health related issues, including available/recomended screening tests and vaccinations. We discussed a need for adhering to healthy diet and exercise. Labs/EKG were reviewed/ordered. All questions were answered.   

## 2012-11-16 NOTE — Assessment & Plan Note (Signed)
Refractory in the past, better now Wt Readings from Last 3 Encounters:  11/11/12 226 lb (102.513 kg)  07/18/12 223 lb (101.152 kg)  05/10/12 224 lb (101.606 kg)

## 2012-11-20 ENCOUNTER — Telehealth: Payer: Self-pay | Admitting: Internal Medicine

## 2012-11-20 NOTE — Telephone Encounter (Signed)
Caller: Demareon/Patient; Phone: [phone]; Reason for Call: Patient was seen in office on Monday 11/11/12 for an annual physical.  At that time, patient discussed sinus infection symptoms with Dr.  Posey Rea.  MD suggested that the symptoms could be related to a blood pressure medication the patient is taking.  Patient reports the symptoms are continuing and would like to discuss possibly changing medications.  Please call patient back regarding this issue.  Thanks.

## 2012-11-20 NOTE — Telephone Encounter (Signed)
D/c Enalapril Start Losartan instead Thx

## 2012-11-20 NOTE — Telephone Encounter (Signed)
Left message for pt to callback office.  

## 2012-11-21 NOTE — Telephone Encounter (Signed)
Left message for pt to callback office.  

## 2012-11-22 MED ORDER — LOSARTAN POTASSIUM 100 MG PO TABS: 100.0000 mg | ORAL_TABLET | Freq: Every day | ORAL | Status: DC

## 2012-11-22 NOTE — Telephone Encounter (Signed)
Caller: Lannis/Patient; Phone: [phone]; Reason for Call: Patient returning a call to [facility].  Please call him back at [phone].

## 2012-11-22 NOTE — Telephone Encounter (Signed)
Pt informed of MD's advisement and rx for Losartan sent to [facility].

## 2012-12-09 ENCOUNTER — Other Ambulatory Visit: Payer: Self-pay | Admitting: Internal Medicine

## 2012-12-12 ENCOUNTER — Other Ambulatory Visit: Payer: Self-pay | Admitting: *Deleted

## 2012-12-12 MED ORDER — PANTOPRAZOLE SODIUM 40 MG PO TBEC: 40.0000 mg | DELAYED_RELEASE_TABLET | Freq: Every day | ORAL | Status: DC

## 2013-03-14 ENCOUNTER — Ambulatory Visit (INDEPENDENT_AMBULATORY_CARE_PROVIDER_SITE_OTHER): Payer: 59 | Admitting: Internal Medicine

## 2013-03-14 ENCOUNTER — Encounter: Payer: Self-pay | Admitting: Internal Medicine

## 2013-03-14 VITALS — BP 130/75 | HR 72 | Resp 16 | Wt 228.0 lb

## 2013-03-14 DIAGNOSIS — K5900 Constipation, unspecified: Secondary | ICD-10-CM | POA: Insufficient documentation

## 2013-03-14 DIAGNOSIS — E538 Deficiency of other specified B group vitamins: Secondary | ICD-10-CM

## 2013-03-14 DIAGNOSIS — L255 Unspecified contact dermatitis due to plants, except food: Secondary | ICD-10-CM

## 2013-03-14 DIAGNOSIS — I10 Essential (primary) hypertension: Secondary | ICD-10-CM

## 2013-03-14 MED ORDER — TRIAMCINOLONE ACETONIDE 0.5 % EX CREA: TOPICAL_CREAM | Freq: Three times a day (TID) | CUTANEOUS | Status: DC

## 2013-03-14 MED ORDER — LINACLOTIDE 145 MCG PO CAPS: 145.0000 ug | ORAL_CAPSULE | ORAL | Status: DC

## 2013-03-14 MED ORDER — VALSARTAN 160 MG PO TABS: 160.0000 mg | ORAL_TABLET | Freq: Every day | ORAL | Status: DC

## 2013-03-14 MED ORDER — METHYLPREDNISOLONE ACETATE 80 MG/ML IJ SUSP
80.0000 mg | Freq: Once | INTRAMUSCULAR | Status: AC
  Administered 2013-03-14: 80 mg via INTRAMUSCULAR

## 2013-03-14 NOTE — Assessment & Plan Note (Signed)
Start diovan 160 mg/d

## 2013-03-14 NOTE — Assessment & Plan Note (Signed)
Triamc qis Depomedrol 80 mg im

## 2013-03-14 NOTE — Patient Instructions (Signed)
Hold Crestor x 1-2 wks Linzess as needed 

## 2013-03-14 NOTE — Assessment & Plan Note (Signed)
Hold Crestor x 1-2 wks Linzess as needed

## 2013-03-14 NOTE — Progress Notes (Signed)
   Subjective:  HPI  C/o diarrhea  2 d last weekend; constipated now... BP was nl at work The patient needs to address  chronic hypertension that has been well controlled with medicines; to address chronic  hyperlipidemia controlled with medicines as well; and to address B12 def, diabetes, controlled with medical treatment and diet.   Wt Readings from Last 3 Encounters:  03/14/13 228 lb (103.42 kg)  11/11/12 226 lb (102.513 kg)  07/18/12 223 lb (101.152 kg)   BP Readings from Last 3 Encounters:  03/14/13 170/86  11/11/12 120/74  07/18/12 146/96     Review of Systems  Constitutional: Negative for appetite change, fatigue and unexpected weight change.  HENT: Negative for nosebleeds, congestion, sore throat, sneezing, trouble swallowing and neck pain.   Eyes: Negative for itching and visual disturbance.  Respiratory: Negative for cough.   Cardiovascular: Negative for chest pain, palpitations and leg swelling.  Gastrointestinal: Negative for nausea, diarrhea, blood in stool and abdominal distention.  Genitourinary: Negative for frequency and hematuria.  Musculoskeletal: Positive for arthralgias (knees hurt). Negative for back pain, joint swelling and gait problem.  Skin: Positive for rash (B foearms).  Neurological: Negative for dizziness, tremors, speech difficulty and weakness.  Psychiatric/Behavioral: Negative for sleep disturbance, dysphoric mood and agitation. The patient is not nervous/anxious.        Objective:   Physical Exam  Constitutional: He is oriented to person, place, and time. He appears well-developed.  obese  HENT:  Mouth/Throat: Oropharynx is clear and moist.  Eyes: Conjunctivae are normal. Pupils are equal, round, and reactive to light.  Neck: Normal range of motion. No JVD present. No thyromegaly present.  Cardiovascular: Normal rate, regular rhythm, normal heart sounds and intact distal pulses.  Exam reveals no gallop and no friction rub.   No murmur  heard. Pulmonary/Chest: Effort normal and breath sounds normal. No respiratory distress. He has no wheezes. He has no rales. He exhibits no tenderness.  Abdominal: Soft. Bowel sounds are normal. He exhibits no distension and no mass. There is no tenderness. There is no rebound and no guarding.  Genitourinary: Rectum normal, prostate normal and penis normal. Guaiac negative stool.  Musculoskeletal: Normal range of motion. He exhibits no edema and no tenderness.  Lymphadenopathy:    He has no cervical adenopathy.  Neurological: He is alert and oriented to person, place, and time. He has normal reflexes. No cranial nerve deficit. He exhibits normal muscle tone. Coordination normal.  Skin: Skin is warm and dry. Rash noted.  B inner forearms R>L  Psychiatric: He has a normal mood and affect. His behavior is normal. Judgment and thought content normal.     Lab Results  Component Value Date   WBC 5.2 11/08/2012   HGB 15.1 11/08/2012   HCT 44.5 11/08/2012   PLT 154.0 11/08/2012   GLUCOSE 99 11/08/2012   CHOL 168 11/08/2012   TRIG 122.0 11/08/2012   HDL 45.10 11/08/2012   LDLDIRECT 146.2 10/07/2010   LDLCALC 99 11/08/2012   ALT 30 11/08/2012   AST 22 11/08/2012   NA 139 11/08/2012   K 4.6 11/08/2012   CL 103 11/08/2012   CREATININE 1.0 11/08/2012   BUN 15 11/08/2012   CO2 30 11/08/2012   TSH 2.60 11/08/2012   PSA 0.57 11/08/2012   HGBA1C 5.9 08/09/2009        Assessment & Plan:

## 2013-03-14 NOTE — Assessment & Plan Note (Signed)
Continue with current prescription therapy as reflected on the Med list.  

## 2013-05-13 ENCOUNTER — Ambulatory Visit: Payer: 59 | Admitting: Internal Medicine

## 2013-05-21 ENCOUNTER — Other Ambulatory Visit: Payer: Self-pay | Admitting: *Deleted

## 2013-05-21 ENCOUNTER — Other Ambulatory Visit (INDEPENDENT_AMBULATORY_CARE_PROVIDER_SITE_OTHER): Payer: 59

## 2013-05-21 DIAGNOSIS — I10 Essential (primary) hypertension: Secondary | ICD-10-CM

## 2013-05-21 DIAGNOSIS — E785 Hyperlipidemia, unspecified: Secondary | ICD-10-CM

## 2013-05-21 DIAGNOSIS — R7309 Other abnormal glucose: Secondary | ICD-10-CM

## 2013-05-21 LAB — CBC WITH DIFFERENTIAL/PLATELET
Basophils Absolute: 0.1 10*3/uL (ref 0.0–0.1)
Eosinophils Relative: 4.9 % (ref 0.0–5.0)
HCT: 44.3 % (ref 39.0–52.0)
Lymphs Abs: 1.6 10*3/uL (ref 0.7–4.0)
MCV: 100.1 fl — ABNORMAL HIGH (ref 78.0–100.0)
Monocytes Absolute: 0.7 10*3/uL (ref 0.1–1.0)
Platelets: 192 10*3/uL (ref 150.0–400.0)
RDW: 13.2 % (ref 11.5–14.6)

## 2013-05-21 LAB — BASIC METABOLIC PANEL
Calcium: 9.1 mg/dL (ref 8.4–10.5)
Creatinine, Ser: 0.9 mg/dL (ref 0.4–1.5)
GFR: 89.11 mL/min (ref >60.00)
Glucose, Bld: 74 mg/dL (ref 70–99)
Sodium: 137 mEq/L (ref 135–145)

## 2013-05-21 LAB — HEPATIC FUNCTION PANEL
Albumin: 3.9 g/dL (ref 3.5–5.2)
Alkaline Phosphatase: 52 U/L (ref 39–117)

## 2013-05-21 LAB — TSH: TSH: 3.34 u[IU]/mL (ref 0.35–5.50)

## 2013-05-21 LAB — LIPID PANEL
HDL: 40.5 mg/dL (ref >39.00)
Triglycerides: 124 mg/dL (ref 0.0–149.0)

## 2013-05-23 ENCOUNTER — Ambulatory Visit (INDEPENDENT_AMBULATORY_CARE_PROVIDER_SITE_OTHER): Payer: 59 | Admitting: Internal Medicine

## 2013-05-23 ENCOUNTER — Encounter: Payer: Self-pay | Admitting: Internal Medicine

## 2013-05-23 VITALS — BP 160/80 | HR 76 | Temp 99.0°F | Resp 16 | Wt 230.0 lb

## 2013-05-23 DIAGNOSIS — N529 Male erectile dysfunction, unspecified: Secondary | ICD-10-CM

## 2013-05-23 DIAGNOSIS — E785 Hyperlipidemia, unspecified: Secondary | ICD-10-CM

## 2013-05-23 DIAGNOSIS — I10 Essential (primary) hypertension: Secondary | ICD-10-CM

## 2013-05-23 DIAGNOSIS — M1990 Unspecified osteoarthritis, unspecified site: Secondary | ICD-10-CM

## 2013-05-23 DIAGNOSIS — R635 Abnormal weight gain: Secondary | ICD-10-CM

## 2013-05-23 DIAGNOSIS — E538 Deficiency of other specified B group vitamins: Secondary | ICD-10-CM

## 2013-05-23 MED ORDER — LOSARTAN POTASSIUM 100 MG PO TABS: 100.0000 mg | ORAL_TABLET | Freq: Every day | ORAL | Status: DC

## 2013-05-23 MED ORDER — DICLOFENAC SODIUM 1 % TD GEL: 4.0000 g | Freq: Four times a day (QID) | TRANSDERMAL | Status: DC

## 2013-05-23 MED ORDER — VARDENAFIL HCL 20 MG PO TABS: 20.0000 mg | ORAL_TABLET | Freq: Every day | ORAL | Status: DC | PRN

## 2013-05-23 MED ORDER — DIAZEPAM 2 MG PO TABS: 1.0000 mg | ORAL_TABLET | Freq: Every day | ORAL | Status: DC | PRN

## 2013-05-23 MED ORDER — DICLOFENAC SODIUM 50 MG PO TBEC: 50.0000 mg | DELAYED_RELEASE_TABLET | Freq: Two times a day (BID) | ORAL | Status: DC | PRN

## 2013-05-23 NOTE — Progress Notes (Signed)
   Subjective:  HPI  F/u being constipated at times... BP was nl at work The patient needs to address  chronic hypertension that has been well controlled with medicines; to address chronic  hyperlipidemia controlled with medicines as well; and to address B12 def, diabetes, controlled with medical treatment and diet.   Wt Readings from Last 3 Encounters:  05/23/13 230 lb (104.327 kg)  03/14/13 228 lb (103.42 kg)  11/11/12 226 lb (102.513 kg)   BP Readings from Last 3 Encounters:  05/23/13 170/80  03/14/13 130/75  11/11/12 120/74     Review of Systems  Constitutional: Negative for appetite change, fatigue and unexpected weight change.  HENT: Negative for nosebleeds, congestion, sore throat, sneezing, trouble swallowing and neck pain.   Eyes: Negative for itching and visual disturbance.  Respiratory: Negative for cough.   Cardiovascular: Negative for chest pain, palpitations and leg swelling.  Gastrointestinal: Negative for nausea, diarrhea, blood in stool and abdominal distention.  Genitourinary: Negative for frequency and hematuria.  Musculoskeletal: Positive for arthralgias (knees hurt). Negative for back pain, joint swelling and gait problem.  Skin: Positive for rash (B foearms).  Neurological: Negative for dizziness, tremors, speech difficulty and weakness.  Psychiatric/Behavioral: Negative for sleep disturbance, dysphoric mood and agitation. The patient is not nervous/anxious.        Objective:   Physical Exam  Constitutional: He is oriented to person, place, and time. He appears well-developed.  obese  HENT:  Mouth/Throat: Oropharynx is clear and moist.  Eyes: Conjunctivae are normal. Pupils are equal, round, and reactive to light.  Neck: Normal range of motion. No JVD present. No thyromegaly present.  Cardiovascular: Normal rate, regular rhythm, normal heart sounds and intact distal pulses.  Exam reveals no gallop and no friction rub.   No murmur  heard. Pulmonary/Chest: Effort normal and breath sounds normal. No respiratory distress. He has no wheezes. He has no rales. He exhibits no tenderness.  Abdominal: Soft. Bowel sounds are normal. He exhibits no distension and no mass. There is no tenderness. There is no rebound and no guarding.  Genitourinary: Rectum normal, prostate normal and penis normal. Guaiac negative stool.  Musculoskeletal: Normal range of motion. He exhibits no edema and no tenderness.  Lymphadenopathy:    He has no cervical adenopathy.  Neurological: He is alert and oriented to person, place, and time. He has normal reflexes. No cranial nerve deficit. He exhibits normal muscle tone. Coordination normal.  Skin: Skin is warm and dry. Rash noted.  B inner forearms R>L  Psychiatric: He has a normal mood and affect. His behavior is normal. Judgment and thought content normal.     Lab Results  Component Value Date   WBC 6.4 05/21/2013   HGB 15.3 05/21/2013   HCT 44.3 05/21/2013   PLT 192.0 05/21/2013   GLUCOSE 74 05/21/2013   CHOL 187 05/21/2013   TRIG 124.0 05/21/2013   HDL 40.50 05/21/2013   LDLDIRECT 146.2 10/07/2010   LDLCALC 122* 05/21/2013   ALT 33 05/21/2013   AST 25 05/21/2013   NA 137 05/21/2013   K 4.3 05/21/2013   CL 100 05/21/2013   CREATININE 0.9 05/21/2013   BUN 19 05/21/2013   CO2 29 05/21/2013   TSH 3.34 05/21/2013   PSA 0.66 05/21/2013   HGBA1C 5.9 08/09/2009        Assessment & Plan:

## 2013-05-24 ENCOUNTER — Encounter: Payer: Self-pay | Admitting: Internal Medicine

## 2013-05-24 DIAGNOSIS — N529 Male erectile dysfunction, unspecified: Secondary | ICD-10-CM | POA: Insufficient documentation

## 2013-05-24 NOTE — Assessment & Plan Note (Signed)
Continue with current prescription therapy as reflected on the Med list.  

## 2013-05-24 NOTE — Assessment & Plan Note (Signed)
Chronic BP nl at work/home Continue with current prescription therapy as reflected on the Med list.

## 2013-05-24 NOTE — Assessment & Plan Note (Signed)
On Levitra prn Stendra samples to try

## 2013-05-24 NOTE — Assessment & Plan Note (Signed)
Wt Readings from Last 3 Encounters:  05/23/13 230 lb (104.327 kg)  03/14/13 228 lb (103.42 kg)  11/11/12 226 lb (102.513 kg)

## 2013-07-06 ENCOUNTER — Emergency Department (HOSPITAL_COMMUNITY)
Admission: EM | Admit: 2013-07-06 | Discharge: 2013-07-06 | Disposition: A | Discharge status: Home / Self Care | Payer: 59 | Source: Home / Self Care | Attending: Emergency Medicine | Admitting: Emergency Medicine

## 2013-07-06 ENCOUNTER — Emergency Department (INDEPENDENT_AMBULATORY_CARE_PROVIDER_SITE_OTHER): Payer: 59

## 2013-07-06 ENCOUNTER — Encounter (HOSPITAL_COMMUNITY): Payer: Self-pay | Admitting: *Deleted

## 2013-07-06 DIAGNOSIS — Z23 Encounter for immunization: Secondary | ICD-10-CM

## 2013-07-06 DIAGNOSIS — S61209A Unspecified open wound of unspecified finger without damage to nail, initial encounter: Secondary | ICD-10-CM

## 2013-07-06 DIAGNOSIS — S61031A Puncture wound without foreign body of right thumb without damage to nail, initial encounter: Secondary | ICD-10-CM

## 2013-07-06 MED ORDER — BACITRACIN 500 UNIT/GM EX OINT
1.0000 "application " | TOPICAL_OINTMENT | Freq: Once | CUTANEOUS | Status: AC
  Administered 2013-07-06: 1 via TOPICAL

## 2013-07-06 MED ORDER — TETANUS-DIPHTH-ACELL PERTUSSIS 5-2.5-18.5 LF-MCG/0.5 IM SUSP
INTRAMUSCULAR | Status: AC
  Filled 2013-07-06: qty 0.5

## 2013-07-06 MED ORDER — TETANUS-DIPHTH-ACELL PERTUSSIS 5-2.5-18.5 LF-MCG/0.5 IM SUSP
0.5000 mL | Freq: Once | INTRAMUSCULAR | Status: AC
  Administered 2013-07-06: 0.5 mL via INTRAMUSCULAR

## 2013-07-06 MED ORDER — CEPHALEXIN 500 MG PO CAPS: 500.0000 mg | ORAL_CAPSULE | Freq: Four times a day (QID) | ORAL | Status: DC

## 2013-07-06 NOTE — ED Provider Notes (Signed)
Medical screening examination/treatment/procedure(s) were performed by a resident physician and as supervising physician I was immediately available for consultation/collaboration.  Leslee Home, M.D.  Reuben Likes, MD 07/06/13 [phone]

## 2013-07-06 NOTE — ED Provider Notes (Signed)
CSN: [id number]     Arrival date & time 07/06/13  7829 History   First MD Initiated Contact with Patient 07/06/13 1036     Chief Complaint  Patient presents with  . Finger Injury   (Consider location/radiation/quality/duration/timing/severity/associated sxs/prior Treatment) HPI Comments: Patient suffered a puncture wound to his right thumb through the nail passing from the dorsal side of the volar side. This occurred while he was drilling sheet metal in the bit broke and punctured his finger completely. He notes that there was little bleeding at the time, and it has caused some throbbing pain but no serious inflammation or drainage since then. Immediately cleaned it with alcohol and water and placed gauze on it. He has been using Pine oil as well that he thinks is relieving the pain. He says the pain is throbbing and unchanged from yesterday. He has not had a tetanus shot approximately 15 years.  Patient is a 62 y.o. male presenting with hand pain. The history is provided by the patient. No language interpreter was used.  Hand Pain This is a new problem. The current episode started yesterday. The problem occurs constantly. The problem has not changed since onset.The symptoms are aggravated by bending. The symptoms are relieved by acetaminophen (has also tried apinol pine oil which provide relief).    Past Medical History  Diagnosis Date  . Hyperlipidemia   . Hypertension   . OA (osteoarthritis)   . Vitamin B 12 deficiency   . Allergic rhinitis   . GERD (gastroesophageal reflux disease)   . Ulcerative esophagitis 2006    Dr. Russella Dar  . Hyperplastic colon polyp 2006  . Obesity   . Sleep apnea    Past Surgical History  Procedure Laterality Date  . Knee arthroscopy      Right   Family History  Problem Relation Age of Onset  . Breast cancer Mother   . Hypertension Mother   . Hypertension Other    History  Substance Use Topics  . Smoking status: Former Games developer  . Smokeless tobacco:  Not on file  . Alcohol Use: Yes     Comment: daily    Review of Systems  Constitutional: Negative for fever.  Musculoskeletal:       He denies any difficulty moving the joints of his thumb or hand.  Skin: Positive for wound.  Neurological: Negative for light-headedness.  Hematological: Does not bruise/bleed easily.    Allergies  Atorvastatin; Enalapril; and Hydrocodone-acetaminophen  Home Medications   Current Outpatient Rx  Name  Route  Sig  Dispense  Refill  . diclofenac (VOLTAREN) 50 MG EC tablet   Oral   Take 1 tablet (50 mg total) by mouth 2 (two) times daily as needed.   60 tablet   3   . diclofenac sodium (VOLTAREN) 1 % GEL   Topical   Apply 4 g topically 4 (four) times daily.   200 g   3   . loratadine (CLARITIN) 10 MG tablet   Oral   Take 1 tablet (10 mg total) by mouth daily.   100 tablet   3   . losartan (COZAAR) 100 MG tablet   Oral   Take 1 tablet (100 mg total) by mouth daily.   90 tablet   3   . pantoprazole (PROTONIX) 40 MG tablet   Oral   Take 1 tablet (40 mg total) by mouth daily.   30 tablet   5   . rosuvastatin (CRESTOR) 40 MG tablet  Oral   Take 1 tablet (40 mg total) by mouth daily.   30 tablet   11   . triamcinolone cream (KENALOG) 0.5 %   Topical   Apply topically 3 (three) times daily.   60 g   0   . vardenafil (LEVITRA) 20 MG tablet   Oral   Take 1 tablet (20 mg total) by mouth daily as needed.   10 tablet   11   . cephALEXin (KEFLEX) 500 MG capsule   Oral   Take 1 capsule (500 mg total) by mouth 4 (four) times daily.   28 capsule   0   . Cyanocobalamin 1000 MCG TBCR   Oral   Take 1 tablet by mouth daily.           . diazepam (VALIUM) 2 MG tablet   Oral   Take 0.5-1 tablets (1-2 mg total) by mouth daily as needed.   30 tablet   5   . fluticasone (FLONASE) 50 MCG/ACT nasal spray   Nasal   Place 2 sprays into the nose daily.   16 g   5   . Linaclotide (LINZESS) 145 MCG CAPS   Oral   Take 1-2  capsules (145-290 mcg total) by mouth 1 day or 1 dose. Use prn constipation   30 capsule   11    BP 152/88  Pulse 85  Temp(Src) 98.5 F (36.9 C) (Oral)  Resp 18  SpO2 98% Physical Exam  Constitutional: He appears well-developed and well-nourished. No distress.  Musculoskeletal:       Right hand: He exhibits normal range of motion and no bony tenderness. Normal sensation noted. He exhibits no finger abduction and no thumb/finger opposition.       Hands: Very small puncture wound through the nail passing to the volar pad of the right thumb likely medial to the distal phalanx, no active drainage or bleeding, no swelling, mild tenderness, normal flexion extension of IP joint and MCP joint  Skin: He is not diaphoretic.    ED Course  Procedures (including critical care time) Labs Review Labs Reviewed - No data to display Imaging Review Dg Finger Thumb Right  07/06/2013   CLINICAL DATA:  Drill injury to distal right 1st finger.  EXAM: RIGHT THUMB 2+V  COMPARISON:  None.  FINDINGS: No fracture, dislocation or soft tissue foreign body is identified.  IMPRESSION: No evidence of fracture or foreign body.   Electronically Signed   By: Irish Lack M.D.   On: 07/06/2013 10:51    MDM   1. Puncture wound of thumb, right, initial encounter    Puncture wound right thumb without evidence of infection or fracture of distal phalanx. Therefore, no need for further intervention. Patient given tetanus shot in the clinic. Thumb also dressed with Vaseline and sterile calls. Patient was given prescription for Keflex 500 mg by mouth 4 times a day to start as needed for signs of infection. He'll also follow up with his regular physician.    Garnetta Buddy, MD 07/06/13 1113

## 2013-07-06 NOTE — ED Notes (Signed)
Reports drilling yesterday @ approx 1430 when drill bit broke off and went through edge of right nailbed all the way through thumb.  C/O continued throbbing.  No s/S infection.  Unsure of tetanus status.

## 2013-08-03 ENCOUNTER — Other Ambulatory Visit: Payer: Self-pay | Admitting: Internal Medicine

## 2013-08-04 ENCOUNTER — Encounter: Payer: Self-pay | Admitting: Internal Medicine

## 2013-08-04 ENCOUNTER — Ambulatory Visit (INDEPENDENT_AMBULATORY_CARE_PROVIDER_SITE_OTHER): Payer: 59 | Admitting: Internal Medicine

## 2013-08-04 VITALS — BP 170/98 | HR 76 | Temp 98.8°F | Resp 16 | Wt 236.0 lb

## 2013-08-04 DIAGNOSIS — G4730 Sleep apnea, unspecified: Secondary | ICD-10-CM

## 2013-08-04 DIAGNOSIS — R635 Abnormal weight gain: Secondary | ICD-10-CM

## 2013-08-04 DIAGNOSIS — I10 Essential (primary) hypertension: Secondary | ICD-10-CM

## 2013-08-04 MED ORDER — IBUPROFEN 600 MG PO TABS: 600.0000 mg | ORAL_TABLET | Freq: Three times a day (TID) | ORAL | Status: DC | PRN

## 2013-08-04 MED ORDER — AZILSARTAN MEDOXOMIL 80 MG PO TABS: 1.0000 | ORAL_TABLET | Freq: Every day | ORAL | Status: DC

## 2013-08-04 MED ORDER — CEFUROXIME AXETIL 500 MG PO TABS: ORAL_TABLET | ORAL | Status: DC

## 2013-08-04 NOTE — Assessment & Plan Note (Signed)
Will switch to [employer]

## 2013-08-04 NOTE — Assessment & Plan Note (Signed)
CPAP.  

## 2013-08-04 NOTE — Assessment & Plan Note (Signed)
Ceftin x10 d 

## 2013-08-04 NOTE — Progress Notes (Signed)
   Subjective:    Sinusitis This is a new problem. The current episode started 1 to 4 weeks ago. The problem has been gradually improving since onset. There has been no fever. He is experiencing no pain. Associated symptoms include congestion. Pertinent negatives include no sneezing or sore throat. The treatment provided mild relief.  C/o no sense of smell x 3-4 wks F/u HTN - worse   BP Readings from Last 3 Encounters:  08/04/13 170/98  07/06/13 152/88  05/23/13 160/80     Wt Readings from Last 3 Encounters:  08/04/13 236 lb (107.049 kg)  05/23/13 230 lb (104.327 kg)  03/14/13 228 lb (103.42 kg)     Review of Systems  Constitutional: Negative for appetite change, fatigue and unexpected weight change.  HENT: Positive for congestion. Negative for nosebleeds, postnasal drip, sneezing and sore throat.   Eyes: Negative for itching and visual disturbance.  Cardiovascular: Negative for chest pain, palpitations and leg swelling.  Gastrointestinal: Negative for nausea, blood in stool and abdominal distention.  Genitourinary: Negative for frequency and hematuria.  Musculoskeletal: Positive for arthralgias (knees hurt). Negative for back pain, gait problem and joint swelling.  Skin: Negative for rash.  Neurological: Negative for dizziness, tremors, speech difficulty and weakness.  Psychiatric/Behavioral: Negative for sleep disturbance, dysphoric mood and agitation. The patient is not nervous/anxious.        Objective:   Physical Exam  Constitutional: He is oriented to person, place, and time. He appears well-developed.  obese  HENT:  Mouth/Throat: Oropharynx is clear and moist.  Swollen nasal mucosa  Eyes: Conjunctivae are normal. Pupils are equal, round, and reactive to light.  Neck: Normal range of motion. No JVD present. No thyromegaly present.  Cardiovascular: Normal rate, regular rhythm, normal heart sounds and intact distal pulses.  Exam reveals no gallop and no friction  rub.   No murmur heard. Pulmonary/Chest: Effort normal and breath sounds normal. No respiratory distress. He has no wheezes. He has no rales. He exhibits no tenderness.  Abdominal: Soft. Bowel sounds are normal. He exhibits no distension and no mass. There is no tenderness. There is no rebound and no guarding.  Musculoskeletal: Normal range of motion. He exhibits no edema and no tenderness.  Lymphadenopathy:    He has no cervical adenopathy.  Neurological: He is alert and oriented to person, place, and time. He has normal reflexes. No cranial nerve deficit. He exhibits normal muscle tone. Coordination normal.  Skin: Skin is warm and dry. No rash noted.  Psychiatric: He has a normal mood and affect. His behavior is normal. Judgment and thought content normal.     Lab Results  Component Value Date   WBC 6.4 05/21/2013   HGB 15.3 05/21/2013   HCT 44.3 05/21/2013   PLT 192.0 05/21/2013   GLUCOSE 74 05/21/2013   CHOL 187 05/21/2013   TRIG 124.0 05/21/2013   HDL 40.50 05/21/2013   LDLDIRECT 146.2 10/07/2010   LDLCALC 122* 05/21/2013   ALT 33 05/21/2013   AST 25 05/21/2013   NA 137 05/21/2013   K 4.3 05/21/2013   CL 100 05/21/2013   CREATININE 0.9 05/21/2013   BUN 19 05/21/2013   CO2 29 05/21/2013   TSH 3.34 05/21/2013   PSA 0.66 05/21/2013   HGBA1C 5.9 08/09/2009        Assessment & Plan:

## 2013-08-04 NOTE — Patient Instructions (Signed)
   Milk free trial (no milk, ice cream, cheese and yogurt) for 4-6 weeks. OK to use almond, coconut, rice or soy milk. "Almond breeze" brand tastes good.  

## 2013-08-04 NOTE — Assessment & Plan Note (Signed)
Wt Readings from Last 3 Encounters:  08/04/13 236 lb (107.049 kg)  05/23/13 230 lb (104.327 kg)  03/14/13 228 lb (103.42 kg)

## 2013-08-07 ENCOUNTER — Telehealth: Payer: Self-pay | Admitting: *Deleted

## 2013-08-07 ENCOUNTER — Other Ambulatory Visit: Payer: Self-pay

## 2013-08-07 NOTE — Telephone Encounter (Signed)
Pt called states new BP medication prescribed is causing muscle and bodyaches.  Please advise

## 2013-08-07 NOTE — Telephone Encounter (Signed)
Pls stop Edarbi x 4 days. Then re-start at 1/2 tab a day. Call if achy again. Thx

## 2013-08-08 NOTE — Telephone Encounter (Signed)
Spoke with pt advised of MDs message.  Pt states he feels much better today and will continue medication as originally prescribed.

## 2013-08-21 ENCOUNTER — Telehealth: Payer: Self-pay | Admitting: *Deleted

## 2013-08-21 DIAGNOSIS — J329 Chronic sinusitis, unspecified: Secondary | ICD-10-CM

## 2013-08-21 NOTE — Telephone Encounter (Signed)
Spoke with pt advised referral sent 

## 2013-08-21 NOTE — Telephone Encounter (Signed)
Ok Done Thx 

## 2013-08-21 NOTE — Telephone Encounter (Signed)
Pt called requesting referral to ENT for recurrent sinus infections.  Please advise

## 2013-10-28 ENCOUNTER — Ambulatory Visit (INDEPENDENT_AMBULATORY_CARE_PROVIDER_SITE_OTHER): Payer: 59 | Admitting: Internal Medicine

## 2013-10-28 ENCOUNTER — Encounter: Payer: Self-pay | Admitting: Internal Medicine

## 2013-10-28 VITALS — BP 180/100 | HR 76 | Temp 97.6°F | Resp 16 | Wt 243.0 lb

## 2013-10-28 DIAGNOSIS — E538 Deficiency of other specified B group vitamins: Secondary | ICD-10-CM

## 2013-10-28 DIAGNOSIS — Z0000 Encounter for general adult medical examination without abnormal findings: Secondary | ICD-10-CM

## 2013-10-28 DIAGNOSIS — J309 Allergic rhinitis, unspecified: Secondary | ICD-10-CM

## 2013-10-28 DIAGNOSIS — I10 Essential (primary) hypertension: Secondary | ICD-10-CM

## 2013-10-28 MED ORDER — AMOXICILLIN-POT CLAVULANATE 875-125 MG PO TABS: 1.0000 | ORAL_TABLET | Freq: Two times a day (BID) | ORAL | Status: DC

## 2013-10-28 NOTE — Progress Notes (Signed)
Pre visit review using our clinic review tool, if applicable. No additional management support is needed unless otherwise documented below in the visit note. 

## 2013-10-28 NOTE — Assessment & Plan Note (Signed)
We can try Edarbi HCT (40-25) samples

## 2013-10-28 NOTE — Patient Instructions (Signed)
   Milk free trial (no milk, ice cream, cheese and yogurt) for 4-6 weeks. OK to use almond, coconut, rice or soy milk. "Almond breeze" brand tastes good.  

## 2013-10-28 NOTE — Progress Notes (Signed)
Patient ID: Zachary Valdez, male   DOB: Jun 20, 1951, 63 y.o.   MRN: [id number]   Subjective:    Sinusitis This is a new problem. The current episode started 1 to 4 weeks ago. The problem has been gradually improving since onset. There has been no fever. He is experiencing no pain. Associated symptoms include congestion. Pertinent negatives include no sneezing or sore throat. The treatment provided mild relief.  C/o no sense of smell and sinusitis x8 wks F/u HTN - BP 125-140/70-90 at home   BP Readings from Last 3 Encounters:  10/28/13 180/100  08/04/13 170/98  07/06/13 152/88     Wt Readings from Last 3 Encounters:  10/28/13 243 lb (110.224 kg)  08/04/13 236 lb (107.049 kg)  05/23/13 230 lb (104.327 kg)     Review of Systems  Constitutional: Negative for appetite change, fatigue and unexpected weight change.  HENT: Positive for congestion. Negative for nosebleeds, postnasal drip, sneezing and sore throat.   Eyes: Negative for itching and visual disturbance.  Cardiovascular: Negative for chest pain, palpitations and leg swelling.  Gastrointestinal: Negative for nausea, blood in stool and abdominal distention.  Genitourinary: Negative for frequency and hematuria.  Musculoskeletal: Positive for arthralgias (knees hurt). Negative for back pain, gait problem and joint swelling.  Skin: Negative for rash.  Neurological: Negative for dizziness, tremors, speech difficulty and weakness.  Psychiatric/Behavioral: Negative for sleep disturbance, dysphoric mood and agitation. The patient is not nervous/anxious.        Objective:   Physical Exam  Constitutional: He is oriented to person, place, and time. He appears well-developed.  obese  HENT:  Mouth/Throat: Oropharynx is clear and moist.  Swollen nasal mucosa  Eyes: Conjunctivae are normal. Pupils are equal, round, and reactive to light.  Neck: Normal range of motion. No JVD present. No thyromegaly present.  Cardiovascular: Normal  rate, regular rhythm, normal heart sounds and intact distal pulses.  Exam reveals no gallop and no friction rub.   No murmur heard. Pulmonary/Chest: Effort normal and breath sounds normal. No respiratory distress. He has no wheezes. He has no rales. He exhibits no tenderness.  Abdominal: Soft. Bowel sounds are normal. He exhibits no distension and no mass. There is no tenderness. There is no rebound and no guarding.  Musculoskeletal: Normal range of motion. He exhibits no edema and no tenderness.  Lymphadenopathy:    He has no cervical adenopathy.  Neurological: He is alert and oriented to person, place, and time. He has normal reflexes. No cranial nerve deficit. He exhibits normal muscle tone. Coordination normal.  Skin: Skin is warm and dry. No rash noted.  Psychiatric: He has a normal mood and affect. His behavior is normal. Judgment and thought content normal.     Lab Results  Component Value Date   WBC 6.4 05/21/2013   HGB 15.3 05/21/2013   HCT 44.3 05/21/2013   PLT 192.0 05/21/2013   GLUCOSE 74 05/21/2013   CHOL 187 05/21/2013   TRIG 124.0 05/21/2013   HDL 40.50 05/21/2013   LDLDIRECT 146.2 10/07/2010   LDLCALC 122* 05/21/2013   ALT 33 05/21/2013   AST 25 05/21/2013   NA 137 05/21/2013   K 4.3 05/21/2013   CL 100 05/21/2013   CREATININE 0.9 05/21/2013   BUN 19 05/21/2013   CO2 29 05/21/2013   TSH 3.34 05/21/2013   PSA 0.66 05/21/2013   HGBA1C 5.9 08/09/2009        Assessment & Plan:

## 2013-10-28 NOTE — Assessment & Plan Note (Signed)
Continue with current prescription therapy as reflected on the Med list.  

## 2013-10-28 NOTE — Assessment & Plan Note (Signed)
Start Augmentin x 2-4 weeks

## 2013-10-28 NOTE — Assessment & Plan Note (Signed)
Due in 3 weeks

## 2013-11-12 ENCOUNTER — Encounter: Payer: 59 | Admitting: Internal Medicine

## 2013-11-18 ENCOUNTER — Encounter: Payer: 59 | Admitting: Internal Medicine

## 2013-11-30 ENCOUNTER — Other Ambulatory Visit: Payer: Self-pay | Admitting: Internal Medicine

## 2013-12-08 ENCOUNTER — Encounter: Payer: 59 | Admitting: Internal Medicine

## 2013-12-19 ENCOUNTER — Other Ambulatory Visit (INDEPENDENT_AMBULATORY_CARE_PROVIDER_SITE_OTHER): Payer: 59

## 2013-12-19 DIAGNOSIS — Z0000 Encounter for general adult medical examination without abnormal findings: Secondary | ICD-10-CM

## 2013-12-19 LAB — CBC WITH DIFFERENTIAL/PLATELET
BASOS ABS: 0 10*3/uL (ref 0.0–0.1)
Basophils Relative: 0.5 % (ref 0.0–3.0)
EOS PCT: 5.3 % — AB (ref 0.0–5.0)
Eosinophils Absolute: 0.3 10*3/uL (ref 0.0–0.7)
HCT: 43.7 % (ref 39.0–52.0)
Hemoglobin: 14.7 g/dL (ref 13.0–17.0)
LYMPHS ABS: 1.4 10*3/uL (ref 0.7–4.0)
Lymphocytes Relative: 23.4 % (ref 12.0–46.0)
MCHC: 33.7 g/dL (ref 30.0–36.0)
MCV: 102.6 fl — ABNORMAL HIGH (ref 78.0–100.0)
Monocytes Absolute: 0.5 10*3/uL (ref 0.1–1.0)
Monocytes Relative: 9.2 % (ref 3.0–12.0)
NEUTROS ABS: 3.6 10*3/uL (ref 1.4–7.7)
NEUTROS PCT: 61.6 % (ref 43.0–77.0)
Platelets: 169 10*3/uL (ref 150.0–400.0)
RBC: 4.26 Mil/uL (ref 4.22–5.81)
RDW: 13.6 % (ref 11.5–14.6)
WBC: 5.9 10*3/uL (ref 4.5–10.5)

## 2013-12-19 LAB — URINALYSIS, ROUTINE W REFLEX MICROSCOPIC
BILIRUBIN URINE: NEGATIVE
Hgb urine dipstick: NEGATIVE
KETONES UR: NEGATIVE
LEUKOCYTES UA: NEGATIVE
Nitrite: NEGATIVE
Total Protein, Urine: 30 — AB
URINE GLUCOSE: NEGATIVE
UROBILINOGEN UA: 0.2 (ref 0.0–1.0)
pH: 6 (ref 5.0–8.0)

## 2013-12-19 LAB — BASIC METABOLIC PANEL
BUN: 22 mg/dL (ref 6–23)
CO2: 27 mEq/L (ref 19–32)
Calcium: 9.2 mg/dL (ref 8.4–10.5)
Chloride: 106 mEq/L (ref 96–112)
Creatinine, Ser: 1.1 mg/dL (ref 0.4–1.5)
GFR: 76.35 mL/min (ref >60.00)
Glucose, Bld: 118 mg/dL — ABNORMAL HIGH (ref 70–99)
Potassium: 4.5 mEq/L (ref 3.5–5.1)
Sodium: 140 mEq/L (ref 135–145)

## 2013-12-19 LAB — LIPID PANEL
CHOLESTEROL: 196 mg/dL (ref 0–200)
HDL: 45.5 mg/dL (ref >39.00)
LDL CALC: 91 mg/dL (ref 0–99)
TRIGLYCERIDES: 297 mg/dL — AB (ref 0.0–149.0)
Total CHOL/HDL Ratio: 4
VLDL: 59.4 mg/dL — ABNORMAL HIGH (ref 0.0–40.0)

## 2013-12-19 LAB — HEPATIC FUNCTION PANEL
ALT: 50 U/L (ref 0–53)
AST: 34 U/L (ref 0–37)
Albumin: 4.3 g/dL (ref 3.5–5.2)
Alkaline Phosphatase: 45 U/L (ref 39–117)
Bilirubin, Direct: 0.1 mg/dL (ref 0.0–0.3)
Total Bilirubin: 0.8 mg/dL (ref 0.3–1.2)
Total Protein: 7 g/dL (ref 6.0–8.3)

## 2013-12-19 LAB — TSH: TSH: 3.01 u[IU]/mL (ref 0.35–5.50)

## 2013-12-19 LAB — PSA: PSA: 0.55 ng/mL (ref 0.10–4.00)

## 2013-12-22 ENCOUNTER — Ambulatory Visit (INDEPENDENT_AMBULATORY_CARE_PROVIDER_SITE_OTHER): Payer: 59 | Admitting: Internal Medicine

## 2013-12-22 VITALS — BP 118/82 | HR 86 | Temp 98.5°F | Resp 16 | Ht 71.5 in | Wt 242.5 lb

## 2013-12-22 DIAGNOSIS — E538 Deficiency of other specified B group vitamins: Secondary | ICD-10-CM

## 2013-12-22 DIAGNOSIS — N529 Male erectile dysfunction, unspecified: Secondary | ICD-10-CM

## 2013-12-22 DIAGNOSIS — R430 Anosmia: Secondary | ICD-10-CM

## 2013-12-22 DIAGNOSIS — R7309 Other abnormal glucose: Secondary | ICD-10-CM

## 2013-12-22 DIAGNOSIS — I10 Essential (primary) hypertension: Secondary | ICD-10-CM

## 2013-12-22 DIAGNOSIS — Z0000 Encounter for general adult medical examination without abnormal findings: Secondary | ICD-10-CM

## 2013-12-22 DIAGNOSIS — K219 Gastro-esophageal reflux disease without esophagitis: Secondary | ICD-10-CM

## 2013-12-22 DIAGNOSIS — R739 Hyperglycemia, unspecified: Secondary | ICD-10-CM

## 2013-12-22 DIAGNOSIS — R439 Unspecified disturbances of smell and taste: Secondary | ICD-10-CM

## 2013-12-22 MED ORDER — ROSUVASTATIN CALCIUM 40 MG PO TABS: 40.0000 mg | ORAL_TABLET | Freq: Every day | ORAL | Status: DC

## 2013-12-22 MED ORDER — PANTOPRAZOLE SODIUM 40 MG PO TBEC: 40.0000 mg | DELAYED_RELEASE_TABLET | Freq: Every day | ORAL | Status: DC

## 2013-12-22 MED ORDER — CLONAZEPAM 0.25 MG PO TBDP: 0.2500 mg | ORAL_TABLET | Freq: Two times a day (BID) | ORAL | Status: DC | PRN

## 2013-12-22 MED ORDER — AZILSARTAN MEDOXOMIL 80 MG PO TABS: 1.0000 | ORAL_TABLET | Freq: Every day | ORAL | Status: DC

## 2013-12-22 MED ORDER — LORATADINE 10 MG PO TABS: 10.0000 mg | ORAL_TABLET | Freq: Every day | ORAL | Status: DC

## 2013-12-22 MED ORDER — CYANOCOBALAMIN ER 1000 MCG PO TBCR: 1.0000 | EXTENDED_RELEASE_TABLET | Freq: Every day | ORAL | Status: DC

## 2013-12-22 MED ORDER — VARDENAFIL HCL 20 MG PO TABS: 20.0000 mg | ORAL_TABLET | Freq: Every day | ORAL | Status: DC | PRN

## 2013-12-22 MED ORDER — TRIAMCINOLONE ACETONIDE 0.5 % EX CREA: TOPICAL_CREAM | Freq: Three times a day (TID) | CUTANEOUS | Status: DC

## 2013-12-22 MED ORDER — LINACLOTIDE 145 MCG PO CAPS: 145.0000 ug | ORAL_CAPSULE | ORAL | Status: DC

## 2013-12-22 MED ORDER — DICLOFENAC SODIUM 50 MG PO TBEC: DELAYED_RELEASE_TABLET | ORAL | Status: DC

## 2013-12-22 MED ORDER — FLUTICASONE PROPIONATE 50 MCG/ACT NA SUSP: 2.0000 | Freq: Every day | NASAL | Status: DC

## 2013-12-22 NOTE — Progress Notes (Signed)
Subjective:  HPI  The patient is here for a wellness exam.   The patient has been doing well overall without major physical or psychological issues going on lately, except for sinusitis sx's - better off dairy.. The patient needs to address  chronic hypertension that has been well controlled with Edarbi; to address chronic  hyperlipidemia controlled with medicines as well; and to address B12 def, diabetes, ED controlled with medical treatment and diet. 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  HENT: Negative for congestion, nosebleeds, sneezing, sore throat and trouble swallowing.   Eyes: Negative for itching and visual disturbance.  Respiratory: Negative for cough.   Cardiovascular: Negative for chest pain, palpitations and leg swelling.  Gastrointestinal: Negative for nausea, diarrhea, blood in stool and abdominal distention.  Genitourinary: Negative for frequency and hematuria.  Musculoskeletal: Positive for arthralgias (knees hurt). Negative for back pain, gait problem, joint swelling and neck pain.  Skin: Negative for rash.  Neurological: Negative for dizziness, tremors, speech difficulty and weakness.  Psychiatric/Behavioral: Negative for sleep disturbance, dysphoric mood and agitation. The patient is not nervous/anxious.        Objective:   Physical Exam  Constitutional: He is oriented to person, place, and time. He appears well-developed.  obese  HENT:  Mouth/Throat: Oropharynx is clear and moist.  Eyes: Conjunctivae are normal. Pupils are equal, round, and reactive to light.  Neck: Normal range of motion. No JVD present. No thyromegaly  present.  Cardiovascular: Normal rate, regular rhythm, normal heart sounds and intact distal pulses.  Exam reveals no gallop and no friction rub.   No murmur heard. Pulmonary/Chest: Effort normal and breath sounds normal. No respiratory distress. He has no wheezes. He has no rales. He exhibits no tenderness.  Abdominal: Soft. Bowel sounds are normal. He exhibits no distension and no mass. There is no tenderness. There is no rebound and no guarding.  Genitourinary: Rectum normal, prostate normal and penis normal. Guaiac negative stool.  Musculoskeletal: Normal range of motion. He exhibits no edema and no tenderness.  Lymphadenopathy:    He has no cervical adenopathy.  Neurological: He is alert and oriented to person, place, and time. He has normal reflexes. No cranial nerve deficit. He exhibits normal muscle tone. Coordination normal.  Skin: Skin is warm and dry. No rash noted.  Psychiatric: He has a normal mood and affect. His behavior is normal. Judgment and thought content normal.     Lab Results  Component Value Date   WBC 5.9 12/19/2013   HGB 14.7 12/19/2013   HCT 43.7 12/19/2013   PLT 169.0 12/19/2013   GLUCOSE 118* 12/19/2013   CHOL 196 12/19/2013   TRIG 297.0* 12/19/2013   HDL 45.50 12/19/2013   LDLDIRECT 146.2 10/07/2010   LDLCALC 91 12/19/2013   ALT 50 12/19/2013   AST 34 12/19/2013   NA 140 12/19/2013   K 4.5 12/19/2013   CL 106 12/19/2013   CREATININE 1.1 12/19/2013   BUN 22 12/19/2013   CO2 27 12/19/2013   TSH 3.01 12/19/2013   PSA 0.55 12/19/2013   HGBA1C 5.9 08/09/2009        Assessment &  Plan:

## 2013-12-22 NOTE — Assessment & Plan Note (Signed)
Better  

## 2013-12-22 NOTE — Assessment & Plan Note (Signed)
Resolved

## 2013-12-22 NOTE — Assessment & Plan Note (Signed)
Continue with current prn prescription therapy as reflected on the Med list.  

## 2013-12-22 NOTE — Assessment & Plan Note (Signed)
Better off dairy

## 2013-12-22 NOTE — Assessment & Plan Note (Signed)
Continue with current prescription therapy as reflected on the Med list.  

## 2013-12-22 NOTE — Progress Notes (Signed)
Pre visit review using our clinic review tool, if applicable. No additional management support is needed unless otherwise documented below in the visit note. 

## 2013-12-22 NOTE — Assessment & Plan Note (Signed)
We discussed age appropriate health related issues, including available/recomended screening tests and vaccinations. We discussed a need for adhering to healthy diet and exercise. Labs/EKG were reviewed/ordered. All questions were answered.   

## 2013-12-23 ENCOUNTER — Telehealth: Payer: Self-pay | Admitting: Internal Medicine

## 2013-12-23 NOTE — Telephone Encounter (Signed)
Relevant patient education assigned to patient using Emmi. ° °

## 2013-12-24 ENCOUNTER — Ambulatory Visit (INDEPENDENT_AMBULATORY_CARE_PROVIDER_SITE_OTHER): Payer: 59

## 2013-12-24 DIAGNOSIS — M779 Enthesopathy, unspecified: Secondary | ICD-10-CM

## 2013-12-24 DIAGNOSIS — M7750 Other enthesopathy of unspecified foot: Secondary | ICD-10-CM

## 2013-12-24 DIAGNOSIS — R52 Pain, unspecified: Secondary | ICD-10-CM

## 2013-12-24 DIAGNOSIS — M778 Other enthesopathies, not elsewhere classified: Secondary | ICD-10-CM

## 2013-12-24 DIAGNOSIS — B070 Plantar wart: Secondary | ICD-10-CM

## 2013-12-24 DIAGNOSIS — M2040 Other hammer toe(s) (acquired), unspecified foot: Secondary | ICD-10-CM

## 2013-12-24 NOTE — Progress Notes (Signed)
   Subjective:    Patient ID: Zachary Valdez, male    DOB: 1951/05/10, 63 y.o.   MRN: [id number]  HPI PT STATED BOTH HAVING SHOOTING PAIN FOR ABOUT 1 MONTH. THE FEET ARE GETTING WORSE. THE FEET GET AGGRAVATED BY WALKING AND STANDING. TRIED DICLOFENAC GEL BUT DID NOT HELP.    Review of Systems  Musculoskeletal: Positive for gait problem and joint swelling.  All other systems reviewed and are negative.       Objective:   Physical Exam Neurovascular status is intact pedal pulses palpable DP and PT posterior were for Refill timed 3-4 seconds skin temperature warm turgor normal no edema rubor pallor or varicosities noted patient does admit rigid digital contractures 2 through 5 bilateral with some plantigrade metatarsals describes a nonviable is for 20 to sub-fourth metatarsal area of the left foot which on palpation reveals of underlying skin lesion or suspect verruca plantaris. He will days keratotic lesion is debrided this time dispensed literature and using topical salicylic acid and such as Aquasol and applying duct tape for occlusion to patient will initiate his treatment first 5-7 days as instructed patient also has some diffuse tenderness and cramping in his toes consistent with hammertoe deformity and history of capsulitis as well as possibly early neuroma symptomology patient might benefit from orthotic adjustments he does have orthotics which referred for about 4-5 years ago will bring those in positive for adjustment or possibly the orthoses in the future.       Assessment & Plan:  Assessment capsulitis of the MTP joint secondary to hammertoe deformities also suspect verruca plantaris skin lesion which is painful sub-fourth metatarsal left foot she topical treatment for the verruca which is debrided at this time reappointed future and as-needed basis for reassessment of his orthoses or followup for verruca fails to resolve  Harriet Masson DPM

## 2013-12-24 NOTE — Patient Instructions (Signed)

## 2014-04-24 ENCOUNTER — Ambulatory Visit: Payer: 59 | Admitting: Internal Medicine

## 2014-05-21 ENCOUNTER — Other Ambulatory Visit (INDEPENDENT_AMBULATORY_CARE_PROVIDER_SITE_OTHER): Payer: 59

## 2014-05-21 DIAGNOSIS — I10 Essential (primary) hypertension: Secondary | ICD-10-CM

## 2014-05-21 DIAGNOSIS — R739 Hyperglycemia, unspecified: Secondary | ICD-10-CM

## 2014-05-21 DIAGNOSIS — R7309 Other abnormal glucose: Secondary | ICD-10-CM

## 2014-05-21 LAB — BASIC METABOLIC PANEL
BUN: 17 mg/dL (ref 6–23)
CHLORIDE: 103 meq/L (ref 96–112)
CO2: 27 mEq/L (ref 19–32)
CREATININE: 1 mg/dL (ref 0.4–1.5)
Calcium: 9.3 mg/dL (ref 8.4–10.5)
GFR: 77.09 mL/min (ref >60.00)
GLUCOSE: 98 mg/dL (ref 70–99)
Potassium: 4.8 mEq/L (ref 3.5–5.1)
Sodium: 139 mEq/L (ref 135–145)

## 2014-05-21 LAB — LIPID PANEL
CHOLESTEROL: 189 mg/dL (ref 0–200)
HDL: 45.6 mg/dL (ref >39.00)
LDL Cholesterol: 107 mg/dL — ABNORMAL HIGH (ref 0–99)
NonHDL: 143.4
TRIGLYCERIDES: 180 mg/dL — AB (ref 0.0–149.0)
Total CHOL/HDL Ratio: 4
VLDL: 36 mg/dL (ref 0.0–40.0)

## 2014-05-21 LAB — HEMOGLOBIN A1C: Hgb A1c MFr Bld: 5.7 % (ref 4.6–6.5)

## 2014-05-22 ENCOUNTER — Ambulatory Visit (INDEPENDENT_AMBULATORY_CARE_PROVIDER_SITE_OTHER): Payer: 59 | Admitting: Internal Medicine

## 2014-05-22 ENCOUNTER — Encounter: Payer: Self-pay | Admitting: Internal Medicine

## 2014-05-22 VITALS — BP 118/84 | HR 92 | Temp 98.4°F | Resp 16 | Wt 245.0 lb

## 2014-05-22 DIAGNOSIS — N529 Male erectile dysfunction, unspecified: Secondary | ICD-10-CM

## 2014-05-22 DIAGNOSIS — I10 Essential (primary) hypertension: Secondary | ICD-10-CM

## 2014-05-22 DIAGNOSIS — J309 Allergic rhinitis, unspecified: Secondary | ICD-10-CM

## 2014-05-22 DIAGNOSIS — E538 Deficiency of other specified B group vitamins: Secondary | ICD-10-CM

## 2014-05-22 MED ORDER — DICLOFENAC SODIUM 1 % TD GEL: 4.0000 g | Freq: Four times a day (QID) | TRANSDERMAL | Status: DC

## 2014-05-22 MED ORDER — VARDENAFIL HCL 20 MG PO TABS: 20.0000 mg | ORAL_TABLET | Freq: Every day | ORAL | Status: DC | PRN

## 2014-05-22 NOTE — Progress Notes (Signed)
Pre visit review using our clinic review tool, if applicable. No additional management support is needed unless otherwise documented below in the visit note. 

## 2014-05-26 NOTE — Assessment & Plan Note (Signed)
Added netti pot Continue with current prescription therapy as reflected on the Med list.

## 2014-05-26 NOTE — Assessment & Plan Note (Signed)
Continue with current prn prescription therapy as reflected on the Med list.  

## 2014-05-26 NOTE — Assessment & Plan Note (Signed)
Continue with current prescription therapy as reflected on the Med list.  

## 2014-06-01 ENCOUNTER — Telehealth: Payer: Self-pay | Admitting: Internal Medicine

## 2014-06-01 MED ORDER — DICLOFENAC SODIUM 50 MG PO TBEC: DELAYED_RELEASE_TABLET | ORAL | Status: DC

## 2014-06-01 NOTE — Telephone Encounter (Signed)
Pt states he was given written rx for voltaren gel, but he has lost it. Would like rx sent to Zachary Valdez on [address]

## 2014-06-01 NOTE — Telephone Encounter (Signed)
rx printed, signed and faxed

## 2014-06-04 ENCOUNTER — Ambulatory Visit: Payer: Self-pay | Admitting: Orthopedic Surgery

## 2014-06-11 ENCOUNTER — Other Ambulatory Visit: Payer: Self-pay | Admitting: *Deleted

## 2014-06-11 MED ORDER — DICLOFENAC SODIUM 1 % TD GEL: 4.0000 g | Freq: Four times a day (QID) | TRANSDERMAL | Status: DC

## 2014-08-15 ENCOUNTER — Ambulatory Visit (INDEPENDENT_AMBULATORY_CARE_PROVIDER_SITE_OTHER): Payer: 59 | Admitting: Family Medicine

## 2014-08-15 ENCOUNTER — Encounter: Payer: Self-pay | Admitting: Family Medicine

## 2014-08-15 VITALS — BP 140/80 | Temp 98.9°F | Wt 248.0 lb

## 2014-08-15 DIAGNOSIS — J019 Acute sinusitis, unspecified: Secondary | ICD-10-CM | POA: Insufficient documentation

## 2014-08-15 DIAGNOSIS — J0100 Acute maxillary sinusitis, unspecified: Secondary | ICD-10-CM

## 2014-08-15 MED ORDER — AMOXICILLIN-POT CLAVULANATE 875-125 MG PO TABS: 1.0000 | ORAL_TABLET | Freq: Two times a day (BID) | ORAL | Status: DC

## 2014-08-15 NOTE — Patient Instructions (Signed)
Use nasal saline and start the antibiotics in the meantime.  Take care.  Glad to see you.

## 2014-08-15 NOTE — Assessment & Plan Note (Signed)
Likely dx, nontoxic, d/w pt.  Nasal saline and augmentin in meantime.  F/u prn.

## 2014-08-15 NOTE — Progress Notes (Signed)
Sx started about 2 week ago.  Lingering and not improved.  Itching nose and throat.  Sense of smell is affected, worse then normal.  Frontal HA.  No FCNAVD.  Still with ST, a "raw feeling in my throat."  No ear pain.  Noted facial pain.  Some cough, a little, rare sputum.   Meds, vitals, and allergies reviewed.   ROS: See HPI.  Otherwise, noncontributory.  GEN: nad, alert and oriented HEENT: mucous membranes moist, tm w/o erythema, nasal exam w/o erythema, clear discharge noted,  OP with cobblestoning NECK: supple w/o LA CV: rrr.   PULM: ctab, no inc wob EXT: no edema SKIN: no acute rash

## 2014-08-24 ENCOUNTER — Ambulatory Visit: Payer: Self-pay | Admitting: Orthopedic Surgery

## 2014-08-24 NOTE — Progress Notes (Signed)
Preoperative surgical orders have been place into the Epic hospital system for Zachary Valdez on 08/24/2014, 8:04 AM  by Mickel Crow for surgery on 09-07-2014.  Preop Total Knee orders including Experal, IV Tylenol, and IV Decadron as long as there are no contraindications to the above medications. Arlee Muslim, PA-C

## 2014-08-26 NOTE — Patient Instructions (Addendum)
Zachary Valdez  08/26/2014   Your procedure is scheduled on:  09/07/2014    Come thru the [facility].    Follow the Signs to [place] at  [place]      am  Call this number if you have problems the morning of surgery: [phone]   Remember:   Do not eat food or drink liquids after midnight.   Take these medicines the morning of surgery with A SIP OF WATER: Claritin if needed, Flonase nasal spray if needed, Klonopin if needed    Do not wear jewelry, .  Do not wear lotions, powders, or perfumes.  deodorant.  . Men may shave face and neck.  Do not bring valuables to the hospital.  Contacts, dentures or bridgework may not be worn into surgery.  Leave suitcase in the car. After surgery it may be brought to your room.  For patients admitted to the hospital, checkout time is 11:00 AM the day of  discharge.          Please read over the following fact sheets that you were given: MRSA Information, coughing and deep breathing exercises, leg exercises            [facility] - Preparing for Surgery Before surgery, you can play an important role.  Because skin is not sterile, your skin needs to be as free of germs as possible.  You can reduce the number of germs on your skin by washing with CHG (chlorahexidine gluconate) soap before surgery.  CHG is an antiseptic cleaner which kills germs and bonds with the skin to continue killing germs even after washing. Please DO NOT use if you have an allergy to CHG or antibacterial soaps.  If your skin becomes reddened/irritated stop using the CHG and inform your nurse when you arrive at Short Stay. Do not shave (including legs and underarms) for at least 48 hours prior to the first CHG shower.  You may shave your face/neck. Please follow these instructions carefully:  1.  Shower with CHG Soap the night before surgery and the  morning of Surgery.  2.  If you choose to wash your hair, wash your hair first as usual with your  normal   shampoo.  3.  After you shampoo, rinse your hair and body thoroughly to remove the  shampoo.                           4.  Use CHG as you would any other liquid soap.  You can apply chg directly  to the skin and wash                       Gently with a scrungie or clean washcloth.  5.  Apply the CHG Soap to your body ONLY FROM THE NECK DOWN.   Do not use on face/ open                           Wound or open sores. Avoid contact with eyes, ears mouth and genitals (private parts).                       Wash face,  Genitals (private parts) with your normal soap.             6.  Wash thoroughly, paying special attention to the area where your  surgery  will be performed.  7.  Thoroughly rinse your body with warm water from the neck down.  8.  DO NOT shower/wash with your normal soap after using and rinsing off  the CHG Soap.                9.  Pat yourself dry with a clean towel.            10.  Wear clean pajamas.            11.  Place clean sheets on your bed the night of your first shower and do not  sleep with pets. Day of Surgery : Do not apply any lotions/deodorants the morning of surgery.  Please wear clean clothes to the hospital/surgery center.  FAILURE TO FOLLOW THESE INSTRUCTIONS MAY RESULT IN THE CANCELLATION OF YOUR SURGERY PATIENT SIGNATURE_________________________________  NURSE SIGNATURE__________________________________  ________________________________________________________________________  WHAT IS A BLOOD TRANSFUSION? Blood Transfusion Information  A transfusion is the replacement of blood or some of its parts. Blood is made up of multiple cells which provide different functions.  Red blood cells carry oxygen and are used for blood loss replacement.  White blood cells fight against infection.  Platelets control bleeding.  Plasma helps clot blood.  Other blood products are available for specialized needs, such as hemophilia or other clotting disorders. BEFORE THE  TRANSFUSION  Who gives blood for transfusions?   Healthy volunteers who are fully evaluated to make sure their blood is safe. This is blood bank blood. Transfusion therapy is the safest it has ever been in the practice of medicine. Before blood is taken from a donor, a complete history is taken to make sure that person has no history of diseases nor engages in risky social behavior (examples are intravenous drug use or sexual activity with multiple partners). The donor's travel history is screened to minimize risk of transmitting infections, such as malaria. The donated blood is tested for signs of infectious diseases, such as HIV and hepatitis. The blood is then tested to be sure it is compatible with you in order to minimize the chance of a transfusion reaction. If you or a relative donates blood, this is often done in anticipation of surgery and is not appropriate for emergency situations. It takes many days to process the donated blood. RISKS AND COMPLICATIONS Although transfusion therapy is very safe and saves many lives, the main dangers of transfusion include:  1. Getting an infectious disease. 2. Developing a transfusion reaction. This is an allergic reaction to something in the blood you were given. Every precaution is taken to prevent this. The decision to have a blood transfusion has been considered carefully by your caregiver before blood is given. Blood is not given unless the benefits outweigh the risks. AFTER THE TRANSFUSION  Right after receiving a blood transfusion, you will usually feel much better and more energetic. This is especially true if your red blood cells have gotten low (anemic). The transfusion raises the level of the red blood cells which carry oxygen, and this usually causes an energy increase.  The nurse administering the transfusion will monitor you carefully for complications. HOME CARE INSTRUCTIONS  No special instructions are needed after a transfusion. You may  find your energy is better. Speak with your caregiver about any limitations on activity for underlying diseases you may have. SEEK MEDICAL CARE IF:   Your condition is not improving after your transfusion.  You develop redness or irritation at the intravenous (IV) site.  SEEK IMMEDIATE MEDICAL CARE IF:  Any of the following symptoms occur over the next 12 hours:  Shaking chills.  You have a temperature by mouth above 102 F (38.9 C), not controlled by medicine.  Chest, back, or muscle pain.  People around you feel you are not acting correctly or are confused.  Shortness of breath or difficulty breathing.  Dizziness and fainting.  You get a rash or develop hives.  You have a decrease in urine output.  Your urine turns a dark color or changes to pink, red, or brown. Any of the following symptoms occur over the next 10 days:  You have a temperature by mouth above 102 F (38.9 C), not controlled by medicine.  Shortness of breath.  Weakness after normal activity.  The white part of the eye turns yellow (jaundice).  You have a decrease in the amount of urine or are urinating less often.  Your urine turns a dark color or changes to pink, red, or brown. Document Released: 09/15/2000 Document Revised: 12/11/2011 Document Reviewed: 05/04/2008 ExitCare Patient Information 2014 [place].  _______________________________________________________________________  Incentive Spirometer  An incentive spirometer is a tool that can help keep your lungs clear and active. This tool measures how well you are filling your lungs with each breath. Taking long deep breaths may help reverse or decrease the chance of developing breathing (pulmonary) problems (especially infection) following:  A long period of time when you are unable to move or be active. BEFORE THE PROCEDURE   If the spirometer includes an indicator to show your best effort, your nurse or respiratory therapist will set  it to a desired goal.  If possible, sit up straight or lean slightly forward. Try not to slouch.  Hold the incentive spirometer in an upright position. INSTRUCTIONS FOR USE  3. Sit on the edge of your bed if possible, or sit up as far as you can in bed or on a chair. 4. Hold the incentive spirometer in an upright position. 5. Breathe out normally. 6. Place the mouthpiece in your mouth and seal your lips tightly around it. 7. Breathe in slowly and as deeply as possible, raising the piston or the ball toward the top of the column. 8. Hold your breath for 3-5 seconds or for as long as possible. Allow the piston or ball to fall to the bottom of the column. 9. Remove the mouthpiece from your mouth and breathe out normally. 10. Rest for a few seconds and repeat Steps 1 through 7 at least 10 times every 1-2 hours when you are awake. Take your time and take a few normal breaths between deep breaths. 11. The spirometer may include an indicator to show your best effort. Use the indicator as a goal to work toward during each repetition. 12. After each set of 10 deep breaths, practice coughing to be sure your lungs are clear. If you have an incision (the cut made at the time of surgery), support your incision when coughing by placing a pillow or rolled up towels firmly against it. Once you are able to get out of bed, walk around indoors and cough well. You may stop using the incentive spirometer when instructed by your caregiver.  RISKS AND COMPLICATIONS  Take your time so you do not get dizzy or light-headed.  If you are in pain, you may need to take or ask for pain medication before doing incentive spirometry. It is harder to take a deep breath if you are having pain.  AFTER USE  Rest and breathe slowly and easily.  It can be helpful to keep track of a log of your progress. Your caregiver can provide you with a simple table to help with this. If you are using the spirometer at home, follow these  instructions: [place] IF:   You are having difficultly using the spirometer.  You have trouble using the spirometer as often as instructed.  Your pain medication is not giving enough relief while using the spirometer.  You develop fever of 100.5 F (38.1 C) or higher. SEEK IMMEDIATE MEDICAL CARE IF:   You cough up bloody sputum that had not been present before.  You develop fever of 102 F (38.9 C) or greater.  You develop worsening pain at or near the incision site. MAKE SURE YOU:   Understand these instructions.  Will watch your condition.  Will get help right away if you are not doing well or get worse. Document Released: 01/29/2007 Document Revised: 12/11/2011 Document Reviewed: 04/01/2007 [facility] Patient Information 2014 [place], [place].   ________________________________________________________________________

## 2014-08-31 ENCOUNTER — Ambulatory Visit (HOSPITAL_COMMUNITY)
Admission: RE | Admit: 2014-08-31 | Discharge: 2014-08-31 | Disposition: A | Discharge status: Home / Self Care | Payer: 59 | Source: Ambulatory Visit | Attending: Orthopedic Surgery | Admitting: Orthopedic Surgery

## 2014-08-31 ENCOUNTER — Encounter (HOSPITAL_COMMUNITY)
Admission: RE | Admit: 2014-08-31 | Discharge: 2014-08-31 | Disposition: A | Discharge status: Home / Self Care | Payer: 59 | Source: Ambulatory Visit | Attending: Orthopedic Surgery | Admitting: Orthopedic Surgery

## 2014-08-31 ENCOUNTER — Encounter (HOSPITAL_COMMUNITY): Payer: Self-pay

## 2014-08-31 DIAGNOSIS — I10 Essential (primary) hypertension: Secondary | ICD-10-CM | POA: Diagnosis not present

## 2014-08-31 DIAGNOSIS — Z01818 Encounter for other preprocedural examination: Secondary | ICD-10-CM | POA: Insufficient documentation

## 2014-08-31 HISTORY — DX: Anxiety disorder, unspecified: F41.9

## 2014-08-31 LAB — COMPREHENSIVE METABOLIC PANEL
ALK PHOS: 55 U/L (ref 39–117)
ALT: 42 U/L (ref 0–53)
AST: 32 U/L (ref 0–37)
Albumin: 4 g/dL (ref 3.5–5.2)
Anion gap: 12 (ref 5–15)
BUN: 21 mg/dL (ref 6–23)
CO2: 26 meq/L (ref 19–32)
Calcium: 9.3 mg/dL (ref 8.4–10.5)
Chloride: 104 mEq/L (ref 96–112)
Creatinine, Ser: 1.29 mg/dL (ref 0.50–1.35)
GFR calc non Af Amer: 58 mL/min — ABNORMAL LOW (ref >90)
GFR, EST AFRICAN AMERICAN: 68 mL/min — AB (ref >90)
GLUCOSE: 112 mg/dL — AB (ref 70–99)
POTASSIUM: 4.3 meq/L (ref 3.7–5.3)
SODIUM: 142 meq/L (ref 137–147)
Total Bilirubin: 0.4 mg/dL (ref 0.3–1.2)
Total Protein: 7.6 g/dL (ref 6.0–8.3)

## 2014-08-31 LAB — URINALYSIS, ROUTINE W REFLEX MICROSCOPIC
BILIRUBIN URINE: NEGATIVE
GLUCOSE, UA: 100 mg/dL — AB
HGB URINE DIPSTICK: NEGATIVE
KETONES UR: NEGATIVE mg/dL
Leukocytes, UA: NEGATIVE
Nitrite: NEGATIVE
PH: 6 (ref 5.0–8.0)
PROTEIN: NEGATIVE mg/dL
Specific Gravity, Urine: 1.022 (ref 1.005–1.030)
Urobilinogen, UA: 0.2 mg/dL (ref 0.0–1.0)

## 2014-08-31 LAB — APTT: aPTT: 29 seconds (ref 24–37)

## 2014-08-31 LAB — CBC
HEMATOCRIT: 45.1 % (ref 39.0–52.0)
HEMOGLOBIN: 14.5 g/dL (ref 13.0–17.0)
MCH: 33.5 pg (ref 26.0–34.0)
MCHC: 32.2 g/dL (ref 30.0–36.0)
MCV: 104.2 fL — ABNORMAL HIGH (ref 78.0–100.0)
Platelets: 229 10*3/uL (ref 150–400)
RBC: 4.33 MIL/uL (ref 4.22–5.81)
RDW: 12.5 % (ref 11.5–15.5)
WBC: 6.5 10*3/uL (ref 4.0–10.5)

## 2014-08-31 LAB — SURGICAL PCR SCREEN
MRSA, PCR: NEGATIVE
STAPHYLOCOCCUS AUREUS: POSITIVE — AB

## 2014-08-31 LAB — PROTIME-INR
INR: 1.02 (ref 0.00–1.49)
Prothrombin Time: 13.5 seconds (ref 11.6–15.2)

## 2014-09-02 NOTE — Progress Notes (Signed)
09-02-14 Positive Staph aureus PCR screen- Rx for Mupiroicn called to [facility] [phone] left on Rx message line. Pt is aware to pick up and use as directed.

## 2014-09-05 NOTE — Anesthesia Preprocedure Evaluation (Signed)
Anesthesia Evaluation  Patient identified by MRN, date of birth, ID band Patient awake    Reviewed: Allergy & Precautions, H&P , NPO status , Patient's Chart, lab work & pertinent test results  Airway Mallampati: II  TM Distance: >3 FB Neck ROM: Full    Dental no notable dental hx. (+) Dental Advisory Given   Pulmonary sleep apnea and Continuous Positive Airway Pressure Ventilation , former smoker,  breath sounds clear to auscultation  Pulmonary exam normal       Cardiovascular Exercise Tolerance: Good hypertension, Pt. on medications Rhythm:Regular Rate:Normal     Neuro/Psych PSYCHIATRIC DISORDERS Anxiety Depression negative neurological ROS     GI/Hepatic Neg liver ROS, PUD, GERD-  Medicated and Controlled,  Endo/Other  obesity  Renal/GU negative Renal ROS  negative genitourinary   Musculoskeletal  (+) Arthritis -, Osteoarthritis,    Abdominal   Peds negative pediatric ROS (+)  Hematology negative hematology ROS (+)   Anesthesia Other Findings   Reproductive/Obstetrics negative OB ROS                             Anesthesia Physical Anesthesia Plan  ASA: II  Anesthesia Plan: Spinal   Post-op Pain Management:    Induction: Intravenous  Airway Management Planned: Nasal Cannula  Additional Equipment:   Intra-op Plan:   Post-operative Plan: Extubation in OR  Informed Consent: I have reviewed the patients History and Physical, chart, labs and discussed the procedure including the risks, benefits and alternatives for the proposed anesthesia with the patient or authorized representative who has indicated his/her understanding and acceptance.   Dental advisory given  Plan Discussed with: CRNA  Anesthesia Plan Comments:         Anesthesia Quick Evaluation

## 2014-09-06 ENCOUNTER — Ambulatory Visit: Payer: Self-pay | Admitting: Orthopedic Surgery

## 2014-09-06 NOTE — H&P (Signed)
TOTAL KNEE ADMISSION H&P  Patient is being admitted for left total knee arthroplasty.  Subjective:  Chief Complaint:left knee pain.  HPI: Zachary Valdez, 63 y.o. male, has a history of pain and functional disability in the left knee due to arthritis and has failed non-surgical conservative treatments for greater than 12 weeks to includeNSAID's and/or analgesics and corticosteriod injections.  Onset of symptoms was gradual,  with gradually worsening course since that time. The patient noted worsening pain on the left knee(s).  Patient currently rates pain in the left knee(s) at moderate with activity. Patient has worsening of pain with activity and weight bearing and pain that interferes with activities of daily living.  Patient has evidence of bone on bone arthritis by imaging studies. This patient has had previous injections. There is no active infection.  Patient Active Problem List   Diagnosis Date Noted  . Acute sinusitis 08/15/2014  . Erectile dysfunction 05/24/2013  . Unspecified constipation 03/14/2013  . Well adult exam 11/16/2012  . Anosmia 05/10/2012  . Sinusitis acute 03/07/2012  . Foot pain, left 05/09/2011  . KNEE PAIN 10/12/2010  . DIVERTICULOSIS-COLON 01/10/2010  . PERSONAL HX COLONIC POLYPS 01/10/2010  . WEIGHT GAIN, ABNORMAL 12/28/2009  . CHEST PAIN 12/28/2009  . DYSPHAGIA UNSPECIFIED 12/28/2009  . ERECTILE DYSFUNCTION, ORGANIC 12/08/2009  . SEBACEOUS CYST, INFECTED 12/08/2009  . LIVER FUNCTION TESTS, ABNORMAL, HX OF 08/13/2009  . TOBACCO USE, QUIT 08/13/2009  . PHARYNGITIS 06/09/2009  . CONTACT DERMATITIS&OTHER ECZEMA DUE TO PLANTS 03/20/2009  . UPPER RESPIRATORY INFECTION (URI) 11/11/2008  . NECK PAIN 08/12/2008  . FURUNCLE 02/21/2008  . OSTEOARTHRITIS 02/21/2008  . HYPERTENSION 06/27/2007  . B12 DEFICIENCY 04/27/2007  . HYPERLIPIDEMIA 04/27/2007  . ALLERGIC RHINITIS 04/27/2007  . GERD 04/27/2007  . SLEEP APNEA 04/27/2007  . HYPERGLYCEMIA 04/27/2007   Past  Medical History  Diagnosis Date  . Hyperlipidemia   . Hypertension   . OA (osteoarthritis)   . Vitamin B 12 deficiency   . Allergic rhinitis   . GERD (gastroesophageal reflux disease)   . Ulcerative esophagitis 2006    Dr. Fuller Plan  . Hyperplastic colon polyp 2006  . Obesity   . Sleep apnea     cpap-   . Anxiety     Past Surgical History  Procedure Laterality Date  . Knee arthroscopy      Right     (Not in a hospital admission) Allergies  Allergen Reactions  . Atorvastatin     REACTION: achy  . Enalapril     cough  . Hydrocodone-Acetaminophen     REACTION: SOB, patient states drug made patient feel weird     History  Substance Use Topics  . Smoking status: Former Research scientist (life sciences)  . Smokeless tobacco: Former Systems developer  . Alcohol Use: Yes     Comment: daily- 1-3 drinks daily  beer and whiskey occasional wine     Family History  Problem Relation Age of Onset  . Breast cancer Mother   . Hypertension Mother   . Hypertension Other      Review of Systems  Constitutional: Negative.   HENT: Negative.   Respiratory: Negative.   Cardiovascular: Negative.   Gastrointestinal: Negative.   Genitourinary: Negative.   Musculoskeletal: Positive for joint pain.    Objective:  Physical Exam  Constitutional: He is oriented to person, place, and time. He appears well-developed and well-nourished. No distress.  HENT:  Head: Normocephalic.  Eyes: EOM are normal. Pupils are equal, round, and reactive to light.  Neck:  Normal range of motion. Neck supple.  Cardiovascular: Normal rate, regular rhythm and normal heart sounds.   No murmur heard. Respiratory: Breath sounds normal.  GI: Soft. Bowel sounds are normal.  Neurological: He is alert and oriented to person, place, and time.    Vitals  BP - 122/78  Estimated body mass index is 33.12 kg/(m^2) as calculated from the following:   Height as of 08/31/14: 6\' 1"  (1.854 m).   Weight as of 08/31/14: 113.853 kg (251 lb).   Imaging  Review Plain radiographs demonstrate severe degenerative joint disease of the left knee(s).  The bone quality appears to be good for age and reported activity level.  Assessment/Plan:  End stage arthritis, left knee   The patient history, physical examination, clinical judgment of the provider and imaging studies are consistent with end stage degenerative joint disease of the left knee(s) and total knee arthroplasty is deemed medically necessary. The treatment options including medical management, injection therapy arthroscopy and arthroplasty were discussed at length. The risks and benefits of total knee arthroplasty were presented and reviewed. The risks due to aseptic loosening, infection, stiffness, patella tracking problems, thromboembolic complications and other imponderables were discussed. The patient acknowledged the explanation, agreed to proceed with the plan and consent was signed. Patient is being admitted for inpatient treatment for surgery, pain control, PT, OT, prophylactic antibiotics, VTE prophylaxis, progressive ambulation and ADL's and discharge planning. The patient is planning to be discharged home with home health services versus rehab.  Arlee Muslim, PA-C

## 2014-09-07 ENCOUNTER — Inpatient Hospital Stay (HOSPITAL_COMMUNITY)
Admission: RE | Admit: 2014-09-07 | Discharge: 2014-09-09 | DRG: 470 | Disposition: A | Discharge status: Home / Self Care | Payer: 59 | Source: Ambulatory Visit | Attending: Orthopedic Surgery | Admitting: Orthopedic Surgery

## 2014-09-07 ENCOUNTER — Encounter (HOSPITAL_COMMUNITY): Payer: Self-pay | Admitting: *Deleted

## 2014-09-07 ENCOUNTER — Inpatient Hospital Stay (HOSPITAL_COMMUNITY): Payer: 59 | Admitting: Anesthesiology

## 2014-09-07 ENCOUNTER — Encounter (HOSPITAL_COMMUNITY)
Admission: RE | Disposition: A | Discharge status: Home / Self Care | Payer: Self-pay | Source: Ambulatory Visit | Attending: Orthopedic Surgery

## 2014-09-07 DIAGNOSIS — F419 Anxiety disorder, unspecified: Secondary | ICD-10-CM | POA: Diagnosis present

## 2014-09-07 DIAGNOSIS — Z87891 Personal history of nicotine dependence: Secondary | ICD-10-CM | POA: Diagnosis not present

## 2014-09-07 DIAGNOSIS — E538 Deficiency of other specified B group vitamins: Secondary | ICD-10-CM | POA: Diagnosis present

## 2014-09-07 DIAGNOSIS — K219 Gastro-esophageal reflux disease without esophagitis: Secondary | ICD-10-CM | POA: Diagnosis present

## 2014-09-07 DIAGNOSIS — J309 Allergic rhinitis, unspecified: Secondary | ICD-10-CM | POA: Diagnosis present

## 2014-09-07 DIAGNOSIS — M179 Osteoarthritis of knee, unspecified: Secondary | ICD-10-CM | POA: Diagnosis present

## 2014-09-07 DIAGNOSIS — M25562 Pain in left knee: Secondary | ICD-10-CM | POA: Diagnosis present

## 2014-09-07 DIAGNOSIS — Z885 Allergy status to narcotic agent status: Secondary | ICD-10-CM | POA: Diagnosis not present

## 2014-09-07 DIAGNOSIS — Z6833 Body mass index (BMI) 33.0-33.9, adult: Secondary | ICD-10-CM | POA: Diagnosis not present

## 2014-09-07 DIAGNOSIS — M1712 Unilateral primary osteoarthritis, left knee: Secondary | ICD-10-CM

## 2014-09-07 DIAGNOSIS — E785 Hyperlipidemia, unspecified: Secondary | ICD-10-CM | POA: Diagnosis present

## 2014-09-07 DIAGNOSIS — I10 Essential (primary) hypertension: Secondary | ICD-10-CM | POA: Diagnosis present

## 2014-09-07 DIAGNOSIS — E669 Obesity, unspecified: Secondary | ICD-10-CM | POA: Diagnosis present

## 2014-09-07 DIAGNOSIS — Z86010 Personal history of colonic polyps: Secondary | ICD-10-CM

## 2014-09-07 DIAGNOSIS — G4730 Sleep apnea, unspecified: Secondary | ICD-10-CM | POA: Diagnosis present

## 2014-09-07 DIAGNOSIS — Z888 Allergy status to other drugs, medicaments and biological substances status: Secondary | ICD-10-CM | POA: Diagnosis not present

## 2014-09-07 DIAGNOSIS — M1710 Unilateral primary osteoarthritis, unspecified knee: Secondary | ICD-10-CM | POA: Diagnosis present

## 2014-09-07 HISTORY — PX: TOTAL KNEE ARTHROPLASTY: SHX125

## 2014-09-07 LAB — TYPE AND SCREEN
ABO/RH(D): A POS
Antibody Screen: NEGATIVE

## 2014-09-07 LAB — ABO/RH: ABO/RH(D): A POS

## 2014-09-07 SURGERY — ARTHROPLASTY, KNEE, TOTAL
Anesthesia: Spinal | Site: Knee | Laterality: Left

## 2014-09-07 MED ORDER — POLYETHYLENE GLYCOL 3350 17 G PO PACK: 17.0000 g | PACK | Freq: Every day | ORAL | Status: DC | PRN

## 2014-09-07 MED ORDER — RIVAROXABAN 10 MG PO TABS
10.0000 mg | ORAL_TABLET | Freq: Every day | ORAL | Status: DC
  Administered 2014-09-08 – 2014-09-09 (×2): 10 mg via ORAL
  Filled 2014-09-07 (×5): qty 1

## 2014-09-07 MED ORDER — PROPOFOL 10 MG/ML IV BOLUS
INTRAVENOUS | Status: AC
  Filled 2014-09-07: qty 20

## 2014-09-07 MED ORDER — CLONAZEPAM 0.5 MG PO TABS: 0.2500 mg | ORAL_TABLET | Freq: Two times a day (BID) | ORAL | Status: DC | PRN

## 2014-09-07 MED ORDER — DEXAMETHASONE SODIUM PHOSPHATE 10 MG/ML IJ SOLN
10.0000 mg | Freq: Once | INTRAMUSCULAR | Status: AC
  Administered 2014-09-07: 10 mg via INTRAVENOUS

## 2014-09-07 MED ORDER — ACETAMINOPHEN 650 MG RE SUPP: 650.0000 mg | Freq: Four times a day (QID) | RECTAL | Status: DC | PRN

## 2014-09-07 MED ORDER — SODIUM CHLORIDE 0.9 % IJ SOLN
INTRAMUSCULAR | Status: AC
  Filled 2014-09-07: qty 50

## 2014-09-07 MED ORDER — METOCLOPRAMIDE HCL 10 MG PO TABS: 5.0000 mg | ORAL_TABLET | Freq: Three times a day (TID) | ORAL | Status: DC | PRN

## 2014-09-07 MED ORDER — FENTANYL CITRATE 0.05 MG/ML IJ SOLN
INTRAMUSCULAR | Status: AC
  Filled 2014-09-07: qty 2

## 2014-09-07 MED ORDER — LACTATED RINGERS IV SOLN
INTRAVENOUS | Status: DC
  Administered 2014-09-07: 1000 mL via INTRAVENOUS

## 2014-09-07 MED ORDER — BUPIVACAINE HCL (PF) 0.25 % IJ SOLN
INTRAMUSCULAR | Status: AC
  Filled 2014-09-07: qty 30

## 2014-09-07 MED ORDER — BUPIVACAINE LIPOSOME 1.3 % IJ SUSP
20.0000 mL | Freq: Once | INTRAMUSCULAR | Status: DC
  Filled 2014-09-07: qty 20

## 2014-09-07 MED ORDER — HYDROMORPHONE HCL 1 MG/ML IJ SOLN
0.5000 mg | INTRAMUSCULAR | Status: DC | PRN
  Administered 2014-09-07 – 2014-09-08 (×3): 1 mg via INTRAVENOUS
  Filled 2014-09-07 (×3): qty 1

## 2014-09-07 MED ORDER — PHENOL 1.4 % MT LIQD: 1.0000 | OROMUCOSAL | Status: DC | PRN

## 2014-09-07 MED ORDER — TRANEXAMIC ACID 100 MG/ML IV SOLN
1000.0000 mg | INTRAVENOUS | Status: AC
  Administered 2014-09-07: 1000 mg via INTRAVENOUS
  Filled 2014-09-07: qty 10

## 2014-09-07 MED ORDER — CLONAZEPAM 0.25 MG PO TBDP
0.2500 mg | ORAL_TABLET | Freq: Two times a day (BID) | ORAL | Status: DC | PRN
Start: 2014-09-07 — End: 2014-09-07

## 2014-09-07 MED ORDER — MIDAZOLAM HCL 2 MG/2ML IJ SOLN
INTRAMUSCULAR | Status: AC
  Filled 2014-09-07: qty 2

## 2014-09-07 MED ORDER — SODIUM CHLORIDE 0.9 % IV SOLN: INTRAVENOUS | Status: DC

## 2014-09-07 MED ORDER — IRBESARTAN 300 MG PO TABS
300.0000 mg | ORAL_TABLET | Freq: Every day | ORAL | Status: DC
  Administered 2014-09-07 – 2014-09-09 (×3): 300 mg via ORAL
  Filled 2014-09-07 (×4): qty 1

## 2014-09-07 MED ORDER — DOCUSATE SODIUM 100 MG PO CAPS
100.0000 mg | ORAL_CAPSULE | Freq: Two times a day (BID) | ORAL | Status: DC
  Administered 2014-09-07 – 2014-09-09 (×4): 100 mg via ORAL

## 2014-09-07 MED ORDER — DEXAMETHASONE SODIUM PHOSPHATE 10 MG/ML IJ SOLN
10.0000 mg | Freq: Once | INTRAMUSCULAR | Status: AC
  Administered 2014-09-08: 10 mg via INTRAVENOUS
  Filled 2014-09-07: qty 1

## 2014-09-07 MED ORDER — KETOROLAC TROMETHAMINE 15 MG/ML IJ SOLN: 7.5000 mg | Freq: Four times a day (QID) | INTRAMUSCULAR | Status: AC | PRN

## 2014-09-07 MED ORDER — FLEET ENEMA 7-19 GM/118ML RE ENEM: 1.0000 | ENEMA | Freq: Once | RECTAL | Status: AC | PRN

## 2014-09-07 MED ORDER — SODIUM CHLORIDE 0.9 % IR SOLN
Status: DC | PRN
  Administered 2014-09-07: 1000 mL

## 2014-09-07 MED ORDER — BUPIVACAINE HCL (PF) 0.75 % IJ SOLN
INTRAMUSCULAR | Status: DC | PRN
  Administered 2014-09-07: 2 mL

## 2014-09-07 MED ORDER — FLUTICASONE PROPIONATE 50 MCG/ACT NA SUSP
2.0000 | Freq: Every day | NASAL | Status: DC | PRN
  Filled 2014-09-07: qty 16

## 2014-09-07 MED ORDER — BUPIVACAINE LIPOSOME 1.3 % IJ SUSP
INTRAMUSCULAR | Status: DC | PRN
  Administered 2014-09-07: 20 mL

## 2014-09-07 MED ORDER — MIDAZOLAM HCL 5 MG/5ML IJ SOLN
INTRAMUSCULAR | Status: DC | PRN
  Administered 2014-09-07: 1 mg via INTRAVENOUS
  Administered 2014-09-07: 2 mg via INTRAVENOUS

## 2014-09-07 MED ORDER — CEFAZOLIN SODIUM-DEXTROSE 2-3 GM-% IV SOLR
2.0000 g | Freq: Four times a day (QID) | INTRAVENOUS | Status: AC
  Administered 2014-09-07 – 2014-09-08 (×2): 2 g via INTRAVENOUS
  Filled 2014-09-07 (×2): qty 50

## 2014-09-07 MED ORDER — LACTATED RINGERS IV SOLN
INTRAVENOUS | Status: DC | PRN
  Administered 2014-09-07 (×2): via INTRAVENOUS

## 2014-09-07 MED ORDER — DIPHENHYDRAMINE HCL 12.5 MG/5ML PO ELIX: 12.5000 mg | ORAL_SOLUTION | ORAL | Status: DC | PRN

## 2014-09-07 MED ORDER — ACETAMINOPHEN 500 MG PO TABS
1000.0000 mg | ORAL_TABLET | Freq: Four times a day (QID) | ORAL | Status: AC
  Administered 2014-09-07 – 2014-09-08 (×4): 1000 mg via ORAL
  Filled 2014-09-07 (×4): qty 2

## 2014-09-07 MED ORDER — LORATADINE 10 MG PO TABS
10.0000 mg | ORAL_TABLET | Freq: Every day | ORAL | Status: DC | PRN
Start: 2014-09-07 — End: 2014-09-09
  Filled 2014-09-07: qty 1

## 2014-09-07 MED ORDER — SODIUM CHLORIDE 0.9 % IJ SOLN
INTRAMUSCULAR | Status: DC | PRN
  Administered 2014-09-07: 30 mL via INTRAVENOUS

## 2014-09-07 MED ORDER — METOCLOPRAMIDE HCL 5 MG/ML IJ SOLN: 5.0000 mg | Freq: Three times a day (TID) | INTRAMUSCULAR | Status: DC | PRN

## 2014-09-07 MED ORDER — ACETAMINOPHEN 325 MG PO TABS: 650.0000 mg | ORAL_TABLET | Freq: Four times a day (QID) | ORAL | Status: DC | PRN

## 2014-09-07 MED ORDER — MENTHOL 3 MG MT LOZG: 1.0000 | LOZENGE | OROMUCOSAL | Status: DC | PRN

## 2014-09-07 MED ORDER — SODIUM CHLORIDE 0.45 % IV SOLN
INTRAVENOUS | Status: DC
  Administered 2014-09-07: 100 mL/h via INTRAVENOUS
  Administered 2014-09-08: 03:00:00 via INTRAVENOUS

## 2014-09-07 MED ORDER — PANTOPRAZOLE SODIUM 40 MG PO TBEC
40.0000 mg | DELAYED_RELEASE_TABLET | Freq: Every day | ORAL | Status: DC
  Administered 2014-09-07 – 2014-09-09 (×3): 40 mg via ORAL
  Filled 2014-09-07 (×5): qty 1

## 2014-09-07 MED ORDER — 0.9 % SODIUM CHLORIDE (POUR BTL) OPTIME
TOPICAL | Status: DC | PRN
  Administered 2014-09-07: 1000 mL

## 2014-09-07 MED ORDER — DEXTROSE 5 % IV SOLN
500.0000 mg | Freq: Four times a day (QID) | INTRAVENOUS | Status: DC | PRN
  Administered 2014-09-08: 500 mg via INTRAVENOUS
  Filled 2014-09-07 (×2): qty 5

## 2014-09-07 MED ORDER — CEFAZOLIN SODIUM-DEXTROSE 2-3 GM-% IV SOLR
INTRAVENOUS | Status: AC
  Filled 2014-09-07: qty 50

## 2014-09-07 MED ORDER — BISACODYL 10 MG RE SUPP: 10.0000 mg | Freq: Every day | RECTAL | Status: DC | PRN

## 2014-09-07 MED ORDER — TRAMADOL HCL 50 MG PO TABS: 50.0000 mg | ORAL_TABLET | Freq: Four times a day (QID) | ORAL | Status: DC | PRN

## 2014-09-07 MED ORDER — ONDANSETRON HCL 4 MG/2ML IJ SOLN: 4.0000 mg | Freq: Once | INTRAMUSCULAR | Status: DC | PRN

## 2014-09-07 MED ORDER — PROPOFOL INFUSION 10 MG/ML OPTIME
INTRAVENOUS | Status: DC | PRN
  Administered 2014-09-07: 150 ug/kg/min via INTRAVENOUS

## 2014-09-07 MED ORDER — PROPOFOL 10 MG/ML IV BOLUS
INTRAVENOUS | Status: DC | PRN
Start: 2014-09-07 — End: 2014-09-07
  Administered 2014-09-07 (×2): 20 mg via INTRAVENOUS

## 2014-09-07 MED ORDER — CEFAZOLIN SODIUM-DEXTROSE 2-3 GM-% IV SOLR
2.0000 g | INTRAVENOUS | Status: AC
  Administered 2014-09-07: 2 g via INTRAVENOUS

## 2014-09-07 MED ORDER — ONDANSETRON HCL 4 MG/2ML IJ SOLN: 4.0000 mg | Freq: Four times a day (QID) | INTRAMUSCULAR | Status: DC | PRN

## 2014-09-07 MED ORDER — METHOCARBAMOL 500 MG PO TABS
500.0000 mg | ORAL_TABLET | Freq: Four times a day (QID) | ORAL | Status: DC | PRN
  Administered 2014-09-08 – 2014-09-09 (×3): 500 mg via ORAL
  Filled 2014-09-07 (×4): qty 1

## 2014-09-07 MED ORDER — FENTANYL CITRATE 0.05 MG/ML IJ SOLN
25.0000 ug | INTRAMUSCULAR | Status: DC | PRN
  Administered 2014-09-07 (×2): 25 ug via INTRAVENOUS

## 2014-09-07 MED ORDER — DEXAMETHASONE SODIUM PHOSPHATE 10 MG/ML IJ SOLN
INTRAMUSCULAR | Status: AC
  Filled 2014-09-07: qty 1

## 2014-09-07 MED ORDER — ONDANSETRON HCL 4 MG PO TABS: 4.0000 mg | ORAL_TABLET | Freq: Four times a day (QID) | ORAL | Status: DC | PRN

## 2014-09-07 MED ORDER — HYDROMORPHONE HCL 2 MG PO TABS
2.0000 mg | ORAL_TABLET | ORAL | Status: DC | PRN
  Administered 2014-09-08 (×5): 4 mg via ORAL
  Administered 2014-09-08: 2 mg via ORAL
  Administered 2014-09-09 (×3): 4 mg via ORAL
  Filled 2014-09-07 (×8): qty 2
  Filled 2014-09-07: qty 1

## 2014-09-07 MED ORDER — BUPIVACAINE HCL 0.25 % IJ SOLN
INTRAMUSCULAR | Status: DC | PRN
  Administered 2014-09-07: 30 mL

## 2014-09-07 MED ORDER — FENTANYL CITRATE 0.05 MG/ML IJ SOLN
INTRAMUSCULAR | Status: DC | PRN
  Administered 2014-09-07 (×2): 50 ug via INTRAVENOUS

## 2014-09-07 MED ORDER — ACETAMINOPHEN 10 MG/ML IV SOLN
1000.0000 mg | Freq: Once | INTRAVENOUS | Status: AC
  Administered 2014-09-07: 1000 mg via INTRAVENOUS
  Filled 2014-09-07: qty 100

## 2014-09-07 MED ORDER — CHLORHEXIDINE GLUCONATE 4 % EX LIQD: 60.0000 mL | Freq: Once | CUTANEOUS | Status: DC

## 2014-09-07 SURGICAL SUPPLY — 57 items
BAG SPEC THK2 15X12 ZIP CLS (MISCELLANEOUS) ×1
BAG ZIPLOCK 12X15 (MISCELLANEOUS) ×2 IMPLANT
BANDAGE ELASTIC 6 VELCRO ST LF (GAUZE/BANDAGES/DRESSINGS) ×2 IMPLANT
BANDAGE ESMARK 6X9 LF (GAUZE/BANDAGES/DRESSINGS) ×1 IMPLANT
BLADE SAG 18X100X1.27 (BLADE) ×2 IMPLANT
BLADE SAW SGTL 11.0X1.19X90.0M (BLADE) ×2 IMPLANT
BNDG ESMARK 6X9 LF (GAUZE/BANDAGES/DRESSINGS) ×2
BOWL SMART MIX CTS (DISPOSABLE) ×2 IMPLANT
CAPT KNEE TOTAL 3 ATTUNE ×2 IMPLANT
CEMENT HV SMART SET (Cement) ×4 IMPLANT
CUFF TOURN SGL QUICK 34 (TOURNIQUET CUFF) ×2
CUFF TRNQT CYL 34X4X40X1 (TOURNIQUET CUFF) ×1 IMPLANT
DECANTER SPIKE VIAL GLASS SM (MISCELLANEOUS) ×2 IMPLANT
DRAPE EXTREMITY T 121X128X90 (DRAPE) ×2 IMPLANT
DRAPE POUCH INSTRU U-SHP 10X18 (DRAPES) ×2 IMPLANT
DRAPE U-SHAPE 47X51 STRL (DRAPES) ×2 IMPLANT
DRSG ADAPTIC 3X8 NADH LF (GAUZE/BANDAGES/DRESSINGS) ×2 IMPLANT
DURAPREP 26ML APPLICATOR (WOUND CARE) ×2 IMPLANT
ELECT REM PT RETURN 9FT ADLT (ELECTROSURGICAL) ×2
ELECTRODE REM PT RTRN 9FT ADLT (ELECTROSURGICAL) ×1 IMPLANT
EVACUATOR 1/8 PVC DRAIN (DRAIN) ×2 IMPLANT
FACESHIELD WRAPAROUND (MASK) ×10 IMPLANT
GAUZE SPONGE 4X4 12PLY STRL (GAUZE/BANDAGES/DRESSINGS) ×2 IMPLANT
GLOVE BIO SURGEON STRL SZ7.5 (GLOVE) IMPLANT
GLOVE BIO SURGEON STRL SZ8 (GLOVE) ×2 IMPLANT
GLOVE BIOGEL PI IND STRL 6.5 (GLOVE) IMPLANT
GLOVE BIOGEL PI IND STRL 8 (GLOVE) ×1 IMPLANT
GLOVE BIOGEL PI INDICATOR 6.5 (GLOVE)
GLOVE BIOGEL PI INDICATOR 8 (GLOVE) ×1
GLOVE SURG SS PI 6.5 STRL IVOR (GLOVE) IMPLANT
GOWN STRL REUS W/TWL LRG LVL3 (GOWN DISPOSABLE) ×2 IMPLANT
GOWN STRL REUS W/TWL XL LVL3 (GOWN DISPOSABLE) IMPLANT
HANDPIECE INTERPULSE COAX TIP (DISPOSABLE) ×1
IMMOBILIZER KNEE 20 (SOFTGOODS) ×2 IMPLANT
KIT BASIN OR (CUSTOM PROCEDURE TRAY) ×2 IMPLANT
MANIFOLD NEPTUNE II (INSTRUMENTS) ×2 IMPLANT
NDL SAFETY ECLIPSE 18X1.5 (NEEDLE) ×2 IMPLANT
NEEDLE HYPO 18GX1.5 SHARP (NEEDLE) ×4
NS IRRIG 1000ML POUR BTL (IV SOLUTION) ×2 IMPLANT
PACK TOTAL JOINT (CUSTOM PROCEDURE TRAY) ×2 IMPLANT
PAD ABD 8X10 STRL (GAUZE/BANDAGES/DRESSINGS) ×2 IMPLANT
PADDING CAST COTTON 6X4 STRL (CAST SUPPLIES) ×2 IMPLANT
POSITIONER SURGICAL ARM (MISCELLANEOUS) ×2 IMPLANT
SET HNDPC FAN SPRY TIP SCT (DISPOSABLE) ×1 IMPLANT
STRIP CLOSURE SKIN 1/2X4 (GAUZE/BANDAGES/DRESSINGS) ×2 IMPLANT
SUCTION FRAZIER 12FR DISP (SUCTIONS) ×2 IMPLANT
SUT MNCRL AB 4-0 PS2 18 (SUTURE) ×2 IMPLANT
SUT VIC AB 2-0 CT1 27 (SUTURE) ×3
SUT VIC AB 2-0 CT1 TAPERPNT 27 (SUTURE) ×3 IMPLANT
SUT VLOC 180 0 24IN GS25 (SUTURE) ×2 IMPLANT
SYR 20CC LL (SYRINGE) ×2 IMPLANT
SYR 50ML LL SCALE MARK (SYRINGE) ×2 IMPLANT
TOWEL OR 17X26 10 PK STRL BLUE (TOWEL DISPOSABLE) ×2 IMPLANT
TOWEL OR NON WOVEN STRL DISP B (DISPOSABLE) IMPLANT
TRAY FOLEY CATH 16FRSI W/METER (SET/KITS/TRAYS/PACK) ×2 IMPLANT
WATER STERILE IRR 1500ML POUR (IV SOLUTION) ×2 IMPLANT
WRAP KNEE MAXI GEL POST OP (GAUZE/BANDAGES/DRESSINGS) ×2 IMPLANT

## 2014-09-07 NOTE — Interval H&P Note (Signed)
History and Physical Interval Note:  09/07/2014 10:42 AM  Zachary Valdez  has presented today for surgery, with the diagnosis of OA LEFT KNEE   The various methods of treatment have been discussed with the patient and family. After consideration of risks, benefits and other options for treatment, the patient has consented to  Procedure(s): LEFT TOTAL KNEE ARTHROPLASTY (Left) as a surgical intervention .  The patient's history has been reviewed, patient examined, no change in status, stable for surgery.  I have reviewed the patient's chart and labs.  Questions were answered to the patient's satisfaction.     Gearlean Alf

## 2014-09-07 NOTE — H&P (View-Only) (Signed)
TOTAL KNEE ADMISSION H&P  Patient is being admitted for left total knee arthroplasty.  Subjective:  Chief Complaint:left knee pain.  HPI: SABURO LUGER, 63 y.o. male, has a history of pain and functional disability in the left knee due to arthritis and has failed non-surgical conservative treatments for greater than 12 weeks to includeNSAID's and/or analgesics and corticosteriod injections.  Onset of symptoms was gradual,  with gradually worsening course since that time. The patient noted worsening pain on the left knee(s).  Patient currently rates pain in the left knee(s) at moderate with activity. Patient has worsening of pain with activity and weight bearing and pain that interferes with activities of daily living.  Patient has evidence of bone on bone arthritis by imaging studies. This patient has had previous injections. There is no active infection.  Patient Active Problem List   Diagnosis Date Noted  . Acute sinusitis 08/15/2014  . Erectile dysfunction 05/24/2013  . Unspecified constipation 03/14/2013  . Well adult exam 11/16/2012  . Anosmia 05/10/2012  . Sinusitis acute 03/07/2012  . Foot pain, left 05/09/2011  . KNEE PAIN 10/12/2010  . DIVERTICULOSIS-COLON 01/10/2010  . PERSONAL HX COLONIC POLYPS 01/10/2010  . WEIGHT GAIN, ABNORMAL 12/28/2009  . CHEST PAIN 12/28/2009  . DYSPHAGIA UNSPECIFIED 12/28/2009  . ERECTILE DYSFUNCTION, ORGANIC 12/08/2009  . SEBACEOUS CYST, INFECTED 12/08/2009  . LIVER FUNCTION TESTS, ABNORMAL, HX OF 08/13/2009  . TOBACCO USE, QUIT 08/13/2009  . PHARYNGITIS 06/09/2009  . CONTACT DERMATITIS&OTHER ECZEMA DUE TO PLANTS 03/20/2009  . UPPER RESPIRATORY INFECTION (URI) 11/11/2008  . NECK PAIN 08/12/2008  . FURUNCLE 02/21/2008  . OSTEOARTHRITIS 02/21/2008  . HYPERTENSION 06/27/2007  . B12 DEFICIENCY 04/27/2007  . HYPERLIPIDEMIA 04/27/2007  . ALLERGIC RHINITIS 04/27/2007  . GERD 04/27/2007  . SLEEP APNEA 04/27/2007  . HYPERGLYCEMIA 04/27/2007   Past  Medical History  Diagnosis Date  . Hyperlipidemia   . Hypertension   . OA (osteoarthritis)   . Vitamin B 12 deficiency   . Allergic rhinitis   . GERD (gastroesophageal reflux disease)   . Ulcerative esophagitis 2006    Dr. Fuller Plan  . Hyperplastic colon polyp 2006  . Obesity   . Sleep apnea     cpap-   . Anxiety     Past Surgical History  Procedure Laterality Date  . Knee arthroscopy      Right     (Not in a hospital admission) Allergies  Allergen Reactions  . Atorvastatin     REACTION: achy  . Enalapril     cough  . Hydrocodone-Acetaminophen     REACTION: SOB, patient states drug made patient feel weird     History  Substance Use Topics  . Smoking status: Former Research scientist (life sciences)  . Smokeless tobacco: Former Systems developer  . Alcohol Use: Yes     Comment: daily- 1-3 drinks daily  beer and whiskey occasional wine     Family History  Problem Relation Age of Onset  . Breast cancer Mother   . Hypertension Mother   . Hypertension Other      Review of Systems  Constitutional: Negative.   HENT: Negative.   Respiratory: Negative.   Cardiovascular: Negative.   Gastrointestinal: Negative.   Genitourinary: Negative.   Musculoskeletal: Positive for joint pain.    Objective:  Physical Exam  Constitutional: He is oriented to person, place, and time. He appears well-developed and well-nourished. No distress.  HENT:  Head: Normocephalic.  Eyes: EOM are normal. Pupils are equal, round, and reactive to light.  Neck:  Normal range of motion. Neck supple.  Cardiovascular: Normal rate, regular rhythm and normal heart sounds.   No murmur heard. Respiratory: Breath sounds normal.  GI: Soft. Bowel sounds are normal.  Neurological: He is alert and oriented to person, place, and time.    Vitals  BP - 122/78  Estimated body mass index is 33.12 kg/(m^2) as calculated from the following:   Height as of 08/31/14: 6\' 1"  (1.854 m).   Weight as of 08/31/14: 113.853 kg (251 lb).   Imaging  Review Plain radiographs demonstrate severe degenerative joint disease of the left knee(s).  The bone quality appears to be good for age and reported activity level.  Assessment/Plan:  End stage arthritis, left knee   The patient history, physical examination, clinical judgment of the provider and imaging studies are consistent with end stage degenerative joint disease of the left knee(s) and total knee arthroplasty is deemed medically necessary. The treatment options including medical management, injection therapy arthroscopy and arthroplasty were discussed at length. The risks and benefits of total knee arthroplasty were presented and reviewed. The risks due to aseptic loosening, infection, stiffness, patella tracking problems, thromboembolic complications and other imponderables were discussed. The patient acknowledged the explanation, agreed to proceed with the plan and consent was signed. Patient is being admitted for inpatient treatment for surgery, pain control, PT, OT, prophylactic antibiotics, VTE prophylaxis, progressive ambulation and ADL's and discharge planning. The patient is planning to be discharged home with home health services versus rehab.  Arlee Muslim, PA-C

## 2014-09-07 NOTE — Anesthesia Procedure Notes (Signed)
Spinal Patient location during procedure: OR Start time: 09/07/2014 12:45 PM End time: 09/07/2014 12:55 PM Staffing Anesthesiologist: Milana Obey Resident/CRNA: Enrigue Catena E Performed by: anesthesiologist  Preanesthetic Checklist Completed: patient identified, site marked, surgical consent, pre-op evaluation, timeout performed, IV checked, risks and benefits discussed and monitors and equipment checked Spinal Block Patient position: sitting Prep: Betadine Patient monitoring: heart rate, continuous pulse ox and blood pressure Injection technique: single-shot Needle Needle type: Spinocan  Needle gauge: 22 G Needle length: 9 cm Assessment Sensory level: T8 Additional Notes Expiration date of kit checked and confirmed. Patient tolerated procedure well, without complications. Several attempts by CRNA with Spinocan and no CSF return or os encountered. Dr. Jillyn Hidden placed with CSF return, no heme, no painful injection and no paresthesia.

## 2014-09-07 NOTE — Op Note (Signed)
Pre-operative diagnosis- Osteoarthritis  Left knee(s)  Post-operative diagnosis- Osteoarthritis Left knee(s)  Procedure-  Left  Total Knee Arthroplasty (Attune system)  Surgeon- Zachary Valdez. Zachary Lance, MD  Assistant- Zachary Muslim, PA-C   Anesthesia-  Spinal  EBL-* No blood loss amount entered *   Drains Hemovac  Tourniquet time-  Total Tourniquet Time Documented: Thigh (Left) - 33 minutes Total: Thigh (Left) - 33 minutes     Complications- None  Condition-PACU - hemodynamically stable.   Brief Clinical Note   Zachary Valdez is a 63 y.o. year old male with end stage OA of his left knee with progressively worsening pain and dysfunction. He has constant pain, with activity and at rest and significant functional deficits with difficulties even with ADLs. He has had extensive non-op management including analgesics, injections of cortisone, and home exercise program, but remains in significant pain with significant dysfunction. Radiographs show bone on bone arthritis medial and patellofemoral. He presents now for left Total Knee Arthroplasty.     Procedure in detail---   The patient is brought into the operating room and positioned supine on the operating table. After successful administration of  Spinal,   a tourniquet is placed high on the Left thigh(s) and the lower extremity is prepped and draped in the usual sterile fashion. Time out is performed by the operating team and then the  Left  lower extremity is wrapped in Esmarch, knee flexed and the tourniquet inflated to 300 mmHg.       A midline incision is made with a ten blade through the subcutaneous tissue to the level of the extensor mechanism. A fresh blade is used to make a medial parapatellar arthrotomy. Soft tissue over the proximal medial tibia is subperiosteally elevated to the joint line with a knife and into the semimembranosus bursa with a Cobb elevator. Soft tissue over the proximal lateral tibia is elevated with attention being  paid to avoiding the patellar tendon on the tibial tubercle. The patella is everted, knee flexed 90 degrees and the ACL and PCL are removed. Findings are bone on bone medial and patellofemoral with massive global osteophytes.        The drill is used to create a starting hole in the distal femur and the canal is thoroughly irrigated with sterile saline to remove the fatty contents. The 5 degree Left  valgus alignment guide is placed into the femoral canal and the distal femoral cutting block is pinned to remove 9 mm off the distal femur. Resection is made with an oscillating saw.      The tibia is subluxed forward and the menisci are removed. The extramedullary alignment guide is placed referencing proximally at the medial aspect of the tibial tubercle and distally along the second metatarsal axis and tibial crest. The block is pinned to remove 9mm off the more deficient medial  side. Resection is made with an oscillating saw. Size 8is the most appropriate size for the tibia and the proximal tibia is prepared with the modular drill and keel punch for that size.      The femoral sizing guide is placed and size 8 is most appropriate. Rotation is marked off the epicondylar axis and confirmed by creating a rectangular flexion gap at 90 degrees. The size 8 cutting block is pinned in this rotation and the anterior, posterior and chamfer cuts are made with the oscillating saw. The intercondylar block is then placed and that cut is made.      Trial size 8  tibial component, trial size 8 posterior stabilized femur and a 6  mm posterior stabilized rotating platform insert trial is placed. Full extension is achieved with excellent varus/valgus and anterior/posterior balance throughout full range of motion. The patella is everted and thickness measured to be 27  mm. Free hand resection is taken to 15 mm, a 41 template is placed, lug holes are drilled, trial patella is placed, and it tracks normally. Osteophytes are removed  off the posterior femur with the trial in place. All trials are removed and the cut bone surfaces prepared with pulsatile lavage. Cement is mixed and once ready for implantation, the size 8 tibial implant, size  8 posterior stabilized femoral component, and the size 41 patella are cemented in place and the patella is held with the clamp. The trial insert is placed and the knee held in full extension. The Exparel (20 ml mixed with 30 ml saline) and .25% Bupivicaine, are injected into the extensor mechanism, posterior capsule, medial and lateral gutters and subcutaneous tissues.  All extruded cement is removed and once the cement is hard the permanent 6 mm posterior stabilized rotating platform insert is placed into the tibial tray.      The wound is copiously irrigated with saline solution and the extensor mechanism closed over a hemovac drain with #1 V-loc suture. The tourniquet is released for a total tourniquet time of 32  minutes. Flexion against gravity is 140 degrees and the patella tracks normally. Subcutaneous tissue is closed with 2.0 vicryl and subcuticular with running 4.0 Monocryl. The incision is cleaned and dried and steri-strips and a bulky sterile dressing are applied. The limb is placed into a knee immobilizer and the patient is awakened and transported to recovery in stable condition.      Please note that a surgical assistant was a medical necessity for this procedure in order to perform it in a safe and expeditious manner. Surgical assistant was necessary to retract the ligaments and vital neurovascular structures to prevent injury to them and also necessary for proper positioning of the limb to allow for anatomic placement of the prosthesis.   Zachary Valdez Zachary Volk, MD    09/07/2014, 1:50 PM

## 2014-09-07 NOTE — Anesthesia Postprocedure Evaluation (Signed)
  Anesthesia Post-op Note  Patient: Zachary Valdez  Procedure(s) Performed: Procedure(s) (LRB): LEFT TOTAL KNEE ARTHROPLASTY (Left)  Patient Location: PACU  Anesthesia Type: Spinal  Level of Consciousness: awake and alert   Airway and Oxygen Therapy: Patient Spontanous Breathing  Post-op Pain: mild  Post-op Assessment: Post-op Vital signs reviewed, Patient's Cardiovascular Status Stable, Respiratory Function Stable, Patent Airway and No signs of Nausea or vomiting  Last Vitals:  Filed Vitals:   09/07/14 1415  BP: 107/67  Pulse: 75  Temp: 36.4 C  Resp: 20    Post-op Vital Signs: stable   Complications: No apparent anesthesia complications

## 2014-09-07 NOTE — Transfer of Care (Signed)
Immediate Anesthesia Transfer of Care Note  Patient: Zachary Valdez  Procedure(s) Performed: Procedure(s): LEFT TOTAL KNEE ARTHROPLASTY (Left)  Patient Location: PACU  Anesthesia Type:Spinal  Level of Consciousness: awake, alert , oriented and patient cooperative  Airway & Oxygen Therapy: Patient Spontanous Breathing and Patient connected to face mask oxygen  Post-op Assessment: Report given to PACU RN and Post -op Vital signs reviewed and stable  Post vital signs: stable  Complications: No apparent anesthesia complications L57 spinal  level

## 2014-09-08 ENCOUNTER — Encounter (HOSPITAL_COMMUNITY): Payer: Self-pay | Admitting: Orthopedic Surgery

## 2014-09-08 LAB — CBC
HEMATOCRIT: 38.3 % — AB (ref 39.0–52.0)
Hemoglobin: 12.4 g/dL — ABNORMAL LOW (ref 13.0–17.0)
MCH: 33.3 pg (ref 26.0–34.0)
MCHC: 32.4 g/dL (ref 30.0–36.0)
MCV: 103 fL — ABNORMAL HIGH (ref 78.0–100.0)
Platelets: 176 10*3/uL (ref 150–400)
RBC: 3.72 MIL/uL — ABNORMAL LOW (ref 4.22–5.81)
RDW: 12.4 % (ref 11.5–15.5)
WBC: 10.2 10*3/uL (ref 4.0–10.5)

## 2014-09-08 LAB — BASIC METABOLIC PANEL
Anion gap: 13 (ref 5–15)
BUN: 16 mg/dL (ref 6–23)
CHLORIDE: 99 meq/L (ref 96–112)
CO2: 23 meq/L (ref 19–32)
Calcium: 8.7 mg/dL (ref 8.4–10.5)
Creatinine, Ser: 0.87 mg/dL (ref 0.50–1.35)
GFR calc Af Amer: >90 mL/min (ref >90)
GFR calc non Af Amer: >90 mL/min (ref >90)
Glucose, Bld: 190 mg/dL — ABNORMAL HIGH (ref 70–99)
Potassium: 4.5 mEq/L (ref 3.7–5.3)
Sodium: 135 mEq/L — ABNORMAL LOW (ref 137–147)

## 2014-09-08 MED ORDER — HYDROMORPHONE HCL 2 MG PO TABS: 2.0000 mg | ORAL_TABLET | ORAL | Status: DC | PRN

## 2014-09-08 MED ORDER — RIVAROXABAN 10 MG PO TABS: 10.0000 mg | ORAL_TABLET | Freq: Every day | ORAL | Status: DC

## 2014-09-08 MED ORDER — TRAMADOL HCL 50 MG PO TABS: 50.0000 mg | ORAL_TABLET | Freq: Four times a day (QID) | ORAL | Status: DC | PRN

## 2014-09-08 MED ORDER — METHOCARBAMOL 500 MG PO TABS: 500.0000 mg | ORAL_TABLET | Freq: Four times a day (QID) | ORAL | Status: DC | PRN

## 2014-09-08 NOTE — Plan of Care (Signed)
Problem: Phase I Progression Outcomes Goal: Other Phase I Outcomes/Goals Outcome: Not Applicable Date Met:  31/49/70

## 2014-09-08 NOTE — Plan of Care (Signed)
Problem: Phase I Progression Outcomes Goal: Initial discharge plan identified Outcome: Completed/Met Date Met:  09/08/14     

## 2014-09-08 NOTE — Progress Notes (Signed)
   Subjective: 1 Day Post-Op Procedure(s) (LRB): LEFT TOTAL KNEE ARTHROPLASTY (Left) Patient reports pain as mild today.   Patient seen in rounds with Dr. Wynelle Link. Doing okay today. Patient is well, but has had some minor complaints of pain in the knee, requiring pain medications We will start therapy today.  Plan is to go Home after hospital stay.  Objective: Vital signs in last 24 hours: Temp:  [97.6 F (36.4 C)-98.8 F (37.1 C)] 98.4 F (36.9 C) (12/08 0658) Pulse Rate:  [65-106] 77 (12/08 0658) Resp:  [12-20] 16 (12/08 0658) BP: (104-148)/(64-93) 107/74 mmHg (12/08 0658) SpO2:  [93 %-100 %] 95 % (12/08 0658) Weight:  [113.853 kg (251 lb)] 113.853 kg (251 lb) (12/07 1019)  Intake/Output from previous day:  Intake/Output Summary (Last 24 hours) at 09/08/14 0818 Last data filed at 09/08/14 0700  Gross per 24 hour  Intake   4375 ml  Output   2115 ml  Net   2260 ml    Intake/Output this shift: UOP 500 since MN  Labs:  Recent Labs  09/08/14 0445  HGB 12.4*    Recent Labs  09/08/14 0445  WBC 10.2  RBC 3.72*  HCT 38.3*  PLT 176    Recent Labs  09/08/14 0445  NA 135*  K 4.5  CL 99  CO2 23  BUN 16  CREATININE 0.87  GLUCOSE 190*  CALCIUM 8.7   No results for input(s): LABPT, INR in the last 72 hours.  EXAM General - Patient is Alert, Appropriate and Oriented Extremity - Neurovascular intact Sensation intact distally Dorsiflexion/Plantar flexion intact Dressing - dressing C/D/I Motor Function - intact, moving foot and toes well on exam.  Hemovac pulled without difficulty.  Past Medical History  Diagnosis Date  . Hyperlipidemia   . Hypertension   . OA (osteoarthritis)   . Vitamin B 12 deficiency   . Allergic rhinitis   . GERD (gastroesophageal reflux disease)   . Ulcerative esophagitis 2006    Dr. Fuller Plan  . Hyperplastic colon polyp 2006  . Obesity   . Sleep apnea     cpap-   . Anxiety     Assessment/Plan: 1 Day Post-Op Procedure(s)  (LRB): LEFT TOTAL KNEE ARTHROPLASTY (Left) Principal Problem:   OA (osteoarthritis) of knee  Estimated body mass index is 33.12 kg/(m^2) as calculated from the following:   Height as of this encounter: 6\' 1"  (1.854 m).   Weight as of this encounter: 113.853 kg (251 lb). Advance diet Up with therapy Plan for discharge tomorrow Discharge home with home health  DVT Prophylaxis - Xarelto Weight-Bearing as tolerated to left leg D/C O2 and Pulse OX and try on Room Air  Arlee Muslim, PA-C Orthopaedic Surgery 09/08/2014, 8:18 AM

## 2014-09-08 NOTE — Progress Notes (Signed)
Physical Therapy Treatment Patient Details Name: MD SMOLA MRN: [id number] DOB: [birth year] Today's Date: 09/08/2014    History of Present Illness L tka    PT Comments    Patient has decided to go to his home with family coming to stay a few days.   Follow Up Recommendations  Home health PT;Supervision/Assistance - 24 hour     Equipment Recommendations  None recommended by PT    Recommendations for Other Services       Precautions / Restrictions Precautions Precautions: Knee;Fall    Mobility  Bed Mobility   Bed Mobility: Sit to Supine       Sit to supine: Min assist   General bed mobility comments: cues for technique  Transfers   Equipment used: Rolling walker (2 wheeled) Transfers: Sit to/from Stand Sit to Stand: Min guard         General transfer comment: vebal cues for hand placement and LE management.  Ambulation/Gait Ambulation/Gait assistance: Min guard Ambulation Distance (Feet): 200 Feet Assistive device: Rolling walker (2 wheeled) Gait Pattern/deviations: Step-to pattern;Decreased step length - left;Decreased stance time - left     General Gait Details: cues for safety, sequence   Stairs            Wheelchair Mobility    Modified Rankin (Stroke Patients Only)       Balance                                    Cognition Arousal/Alertness: Awake/alert                          Exercises Total Joint Exercises Ankle Circles/Pumps: AROM;Both;10 reps;Supine Quad Sets: AROM;Left;10 reps;Supine Short Arc Quad: AAROM;Left;10 reps;Supine Heel Slides: AAROM;Left;10 reps;Supine Straight Leg Raises: AAROM;Left;10 reps;Supine    General Comments        Pertinent Vitals/Pain Pain Location: 5 L knee Pain Descriptors / Indicators: Aching;Sore Pain Intervention(s): Monitored during session;Ice applied;Premedicated before session    Home Living                      Prior Function             PT Goals (current goals can now be found in the care plan section) Progress towards PT goals: Progressing toward goals    Frequency       PT Plan Current plan remains appropriate    Co-evaluation             End of Session Equipment Utilized During Treatment: Left knee immobilizer Activity Tolerance: Patient tolerated treatment well Patient left: in bed;in CPM     Time: 1415-1443 PT Time Calculation (min) (ACUTE ONLY): 28 min  Charges:  $Gait Training: 8-22 mins $Therapeutic Exercise: 8-22 mins                    G Codes:      Claretha Cooper 09/08/2014, 6:13 PM

## 2014-09-08 NOTE — Plan of Care (Signed)
Problem: Phase I Progression Outcomes Goal: Voiding-avoid urinary catheter unless indicated Outcome: Completed/Met Date Met:  09/08/14  Problem: Phase II Progression Outcomes Goal: Pain controlled Outcome: Completed/Met Date Met:  09/08/14 Goal: Progressing with IS, TCDB Outcome: Completed/Met Date Met:  09/08/14 Goal: Vital signs stable Outcome: Completed/Met Date Met:  09/08/14 Goal: Surgical site without signs of infection Outcome: Completed/Met Date Met:  09/08/14 Goal: Dressings dry/intact Outcome: Completed/Met Date Met:  09/08/14 Goal: Foley discontinued Outcome: Completed/Met Date Met:  09/08/14 Goal: Discharge plan established Outcome: Completed/Met Date Met:  09/08/14 Goal: Tolerating diet Outcome: Completed/Met Date Met:  09/08/14  Problem: Phase III Progression Outcomes Goal: Pain controlled on oral analgesia Outcome: Completed/Met Date Met:  09/08/14 Goal: Voiding independently Outcome: Completed/Met Date Met:  09/08/14 Goal: IV changed to normal saline lock Outcome: Completed/Met Date Met:  09/08/14 Goal: Nasogastric tube discontinued Outcome: Not Applicable Date Met:  66/19/69 Goal: Discharge plan remains appropriate-arrangements made Outcome: Completed/Met Date Met:  09/08/14 Goal: Demonstrates TCDB, IS independently Outcome: Completed/Met Date Met:  09/08/14

## 2014-09-08 NOTE — Evaluation (Signed)
Occupational Therapy Evaluation Patient Details Name: Zachary Valdez MRN: [id number] DOB: 01-Sep-1951 Today's Date: 09/08/2014    History of Present Illness L tka   Clinical Impression   Pt up to practice 3in1 transfer and educated on tub options for DME and AE. Will follow on acute to progress ADL independence. Pt with limited help at home.     Follow Up Recommendations  No OT follow up;Home health OT (depends on progress on acute)    Equipment Recommendations  None recommended by OT    Recommendations for Other Services       Precautions / Restrictions Precautions Precautions: Knee;Fall Required Braces or Orthoses: Knee Immobilizer - Left Knee Immobilizer - Left: Discontinue once straight leg raise with < 10 degree lag Restrictions Weight Bearing Restrictions: No      Mobility Bed Mobility             Transfers Overall transfer level: Needs assistance Equipment used: Rolling walker (2 wheeled) Transfers: Sit to/from Stand Sit to Stand: Min guard         General transfer comment: vebal cues for hand placement and LE management.    Balance                                            ADL Overall ADL's : Needs assistance/impaired Eating/Feeding: Independent;Sitting   Grooming: Wash/dry hands;Set up;Sitting   Upper Body Bathing: Set up;Sitting   Lower Body Bathing: Min guard;Sit to/from stand   Upper Body Dressing : Set up;Sitting   Lower Body Dressing: Min guard;Sit to/from stand   Toilet Transfer: Min guard;Ambulation;BSC;RW   Toileting- Water quality scientist and Hygiene: Min guard;Sit to/from stand         General ADL Comments: Pt able to don and doff L sock with increased effort. Discussed option of AE to make task easier but pt states he is probably ok without it. He states he has some family that can assist for a few nights at home also. Pt needed min cues for walker distance to self and not picking up the walker. He would  benefit from a walker bag-will check to see if any in rehab department. Also discussed tub transfer and explained tub transfer bench option versus his seat and having to step over tub. He states he may just sponge bathe a few days initially.      Vision                     Perception     Praxis      Pertinent Vitals/Pain Pain Assessment: 0-10 Pain Score: 5  Pain Location: L knee Pain Descriptors / Indicators: Sore Pain Intervention(s): Ice applied;Repositioned     Hand Dominance     Extremity/Trunk Assessment Upper Extremity Assessment Upper Extremity Assessment: Overall WFL for tasks assessed      Cervical / Trunk Assessment Cervical / Trunk Assessment: Normal   Communication Communication Communication: No difficulties   Cognition Arousal/Alertness: Awake/alert Behavior During Therapy: WFL for tasks assessed/performed Overall Cognitive Status: Within Functional Limits for tasks assessed                     General Comments       Exercises       Shoulder Instructions      Home Living Family/patient expects to be discharged to:: Private residence Living Arrangements:  Alone Available Help at Discharge: Family;Available PRN/intermittently Type of Home: House Home Access: Stairs to enter Entrance Stairs-Number of Steps: 1 + 2   Home Layout: One level     Bathroom Shower/Tub: Teacher, early years/pre: Standard     Home Equipment: Environmental consultant - 2 wheels;Cane - single point;Bedside commode;Shower seat   Additional Comments: states that his brother in law can stay for first 2 nights if needed. Other option is to go to parents home which is a split level with about 3 levels between bedroom , kitchen and den. Father is home with nearly 24/caregivers, mother there, both ar e in their 48's      Prior Functioning/Environment Level of Independence: Independent             OT Diagnosis: Generalized weakness   OT Problem List: Decreased  strength;Decreased knowledge of use of DME or AE   OT Treatment/Interventions: Self-care/ADL training;Patient/family education;Therapeutic activities;DME and/or AE instruction    OT Goals(Current goals can be found in the care plan section) Acute Rehab OT Goals Patient Stated Goal: wants to go home OT Goal Formulation: With patient Time For Goal Achievement: 09/15/14 Potential to Achieve Goals: Good  OT Frequency: Min 2X/week   Barriers to D/C:            Co-evaluation              End of Session Equipment Utilized During Treatment: Rolling walker;Left knee immobilizer CPM Left Knee CPM Left Knee: On  Activity Tolerance: Patient tolerated treatment well Patient left: in chair;with call bell/phone within reach   Time: 1478-2956 OT Time Calculation (min): 29 min Charges:  OT General Charges $OT Visit: 1 Procedure OT Evaluation $Initial OT Evaluation Tier I: 1 Procedure OT Treatments $Self Care/Home Management : 8-22 mins $Therapeutic Activity: 8-22 mins G-Codes:    Jules Schick  [phone] 09/08/2014, 11:31 AM

## 2014-09-08 NOTE — Discharge Instructions (Addendum)
° °Dr. Frank Aluisio °Total Joint Specialist °[facility] °[address] °[place], [place] [address] °[phone] ° °TOTAL KNEE REPLACEMENT POSTOPERATIVE DIRECTIONS ° ° ° °Knee Rehabilitation, Guidelines Following Surgery  °Results after knee surgery are often greatly improved when you follow the exercise, range of motion and muscle strengthening exercises prescribed by your doctor. Safety measures are also important to protect the knee from further injury. Any time any of these exercises cause you to have increased pain or swelling in your knee joint, decrease the amount until you are comfortable again and slowly increase them. If you have problems or questions, call your caregiver or physical therapist for advice.  ° °HOME CARE INSTRUCTIONS  °Remove items at home which could result in a fall. This includes throw rugs or furniture in walking pathways.  °Continue medications as instructed at time of discharge. °You may have some home medications which will be placed on hold until you complete the course of blood thinner medication.  °You may start showering once you are discharged home but do not submerge the incision under water. Just pat the incision dry and apply a dry gauze dressing on daily. °Walk with walker as instructed.  °You may resume a sexual relationship in one month or when given the OK by  your doctor.  °· Use walker as long as suggested by your caregivers. °· Avoid periods of inactivity such as sitting longer than an hour when not asleep. This helps prevent blood clots.  °You may put full weight on your legs and walk as much as is comfortable.  °You may return to work once you are cleared by your doctor.  °Do not drive a car for 6 weeks or until released by you surgeon.  °· Do not drive while taking narcotics.  °Wear the elastic stockings for three weeks following surgery during the day but you may remove then at night. °Make sure you keep all of your appointments after your  operation with all of your doctors and caregivers. You should call the office at the above phone number and make an appointment for approximately two weeks after the date of your surgery. °Change the dressing daily and reapply a dry dressing each time. °Please pick up a stool softener and laxative for home use as long as you are requiring pain medications. °· ICE to the affected knee every three hours for 30 minutes at a time and then as needed for pain and swelling.  Continue to use ice on the knee for pain and swelling from surgery. You may notice swelling that will progress down to the foot and ankle.  This is normal after surgery.  Elevate the leg when you are not up walking on it.   °It is important for you to complete the blood thinner medication as prescribed by your doctor. °· Continue to use the breathing machine which will help keep your temperature down.  It is common for your temperature to cycle up and down following surgery, especially at night when you are not up moving around and exerting yourself.  The breathing machine keeps your lungs expanded and your temperature down. ° °RANGE OF MOTION AND STRENGTHENING EXERCISES  °Rehabilitation of the knee is important following a knee injury or an operation. After just a few days of immobilization, the muscles of the thigh which control the knee become weakened and shrink (atrophy). Knee exercises are designed to build up the tone and strength of the thigh muscles and to improve knee   motion. Often times heat used for twenty to thirty minutes before working out will loosen up your tissues and help with improving the range of motion but do not use heat for the first two weeks following surgery. These exercises can be done on a training (exercise) mat, on the floor, on a table or on a bed. Use what ever works the best and is most comfortable for you Knee exercises include:  Leg Lifts - While your knee is still immobilized in a splint or cast, you can do  straight leg raises. Lift the leg to 60 degrees, hold for 3 sec, and slowly lower the leg. Repeat 10-20 times 2-3 times daily. Perform this exercise against resistance later as your knee gets better.  Quad and Hamstring Sets - Tighten up the muscle on the front of the thigh (Quad) and hold for 5-10 sec. Repeat this 10-20 times hourly. Hamstring sets are done by pushing the foot backward against an object and holding for 5-10 sec. Repeat as with quad sets.  A rehabilitation program following serious knee injuries can speed recovery and prevent re-injury in the future due to weakened muscles. Contact your doctor or a physical therapist for more information on knee rehabilitation.   SKILLED REHAB INSTRUCTIONS: If the patient is transferred to a skilled rehab facility following release from the hospital, a list of the current medications will be sent to the facility for the patient to continue.  When discharged from the skilled rehab facility, please have the facility set up the patient's [place] prior to being released. Also, the skilled facility will be responsible for providing the patient with their medications at time of release from the facility to include their pain medication, the muscle relaxants, and their blood thinner medication. If the patient is still at the rehab facility at time of the two week follow up appointment, the skilled rehab facility will also need to assist the patient in arranging follow up appointment in our office and any transportation needs.  MAKE SURE YOU:  Understand these instructions.  Will watch your condition.  Will get help right away if you are not doing well or get worse.    Pick up stool softner and laxative for home. Do not submerge incision under water. May shower. Continue to use ice for pain and swelling from surgery.  Take Xarelto for two and a half more weeks, then discontinue Xarelto. Once the patient has completed the blood thinner  regimen, then take a Baby 81 mg Aspirin daily for three more weeks.   Postoperative Constipation Protocol  Constipation - defined medically as fewer than three stools per week and severe constipation as less than one stool per week.  One of the most common issues patients have following surgery is constipation.  Even if you have a regular bowel pattern at home, your normal regimen is likely to be disrupted due to multiple reasons following surgery.  Combination of anesthesia, postoperative narcotics, change in appetite and fluid intake all can affect your bowels.  In order to avoid complications following surgery, here are some recommendations in order to help you during your recovery period.  Colace (docusate) - Pick up an over-the-counter form of Colace or another stool softener and take twice a day as long as you are requiring postoperative pain medications.  Take with a full glass of water daily.  If you experience loose stools or diarrhea, hold the colace until you stool forms back up.  If your symptoms  do not get better within 1 week or if they get worse, check with your doctor.  Dulcolax (bisacodyl) - Pick up over-the-counter and take as directed by the product packaging as needed to assist with the movement of your bowels.  Take with a full glass of water.  Use this product as needed if not relieved by Colace only.   MiraLax (polyethylene glycol) - Pick up over-the-counter to have on hand.  MiraLax is a solution that will increase the amount of water in your bowels to assist with bowel movements.  Take as directed and can mix with a glass of water, juice, soda, coffee, or tea.  Take if you go more than two days without a movement. Do not use MiraLax more than once per day. Call your doctor if you are still constipated or irregular after using this medication for 7 days in a row.  If you continue to have problems with postoperative constipation, please contact the office for further assistance  and recommendations.  If you experience "the worst abdominal pain ever" or develop nausea or vomiting, please contact the office immediatly for further recommendations for treatment.   Information on my medicine - XARELTO (Rivaroxaban)  This medication education was reviewed with me or my healthcare representative as part of my discharge preparation.  The pharmacist that spoke with me during my hospital stay was:  Absher, Julieta Bellini, RPH  Why was Xarelto prescribed for you? Xarelto was prescribed for you to reduce the risk of blood clots forming after orthopedic surgery. The medical term for these abnormal blood clots is venous thromboembolism (VTE).  What do you need to know about xarelto ? Take your Xarelto ONCE DAILY at the same time every day. You may take it either with or without food.  If you have difficulty swallowing the tablet whole, you may crush it and mix in applesauce just prior to taking your dose.  Take Xarelto exactly as prescribed by your doctor and DO NOT stop taking Xarelto without talking to the doctor who prescribed the medication.  Stopping without other VTE prevention medication to take the place of Xarelto may increase your risk of developing a clot.  After discharge, you should have regular check-up appointments with your healthcare provider that is prescribing your Xarelto.    What do you do if you miss a dose? If you miss a dose, take it as soon as you remember on the same day then continue your regularly scheduled once daily regimen the next day. Do not take two doses of Xarelto on the same day.   Important Safety Information A possible side effect of Xarelto is bleeding. You should call your healthcare provider right away if you experience any of the following: ? Bleeding from an injury or your nose that does not stop. ? Unusual colored urine (red or dark brown) or unusual colored stools (red or black). ? Unusual bruising for unknown reasons. ? A  serious fall or if you hit your head (even if there is no bleeding).  Some medicines may interact with Xarelto and might increase your risk of bleeding while on Xarelto. To help avoid this, consult your healthcare provider or pharmacist prior to using any new prescription or non-prescription medications, including herbals, vitamins, non-steroidal anti-inflammatory drugs (NSAIDs) and supplements.  This website has more information on Xarelto: [email or web address]

## 2014-09-08 NOTE — Plan of Care (Signed)
Problem: Phase I Progression Outcomes Goal: Tubes/drains patent Outcome: Completed/Met Date Met:  09/08/14

## 2014-09-08 NOTE — Discharge Summary (Signed)
Physician Discharge Summary   Patient ID: Zachary Valdez MRN: [id number] DOB/AGE: 1951-01-30 63 y.o.  Admit date: 09/07/2014 Discharge date: 09/09/2014  Primary Diagnosis:  Osteoarthritis Left knee(s)  Admission Diagnoses:  Past Medical History  Diagnosis Date  . Hyperlipidemia   . Hypertension   . OA (osteoarthritis)   . Vitamin B 12 deficiency   . Allergic rhinitis   . GERD (gastroesophageal reflux disease)   . Ulcerative esophagitis 2006    Dr. Fuller Plan  . Hyperplastic colon polyp 2006  . Obesity   . Sleep apnea     cpap-   . Anxiety    Discharge Diagnoses:   Principal Problem:   OA (osteoarthritis) of knee  Estimated body mass index is 33.12 kg/(m^2) as calculated from the following:   Height as of this encounter: '6\' 1"'  (1.854 m).   Weight as of this encounter: 113.853 kg (251 lb).  Procedure:  Procedure(s) (LRB): LEFT TOTAL KNEE ARTHROPLASTY (Left)   Consults: None  HPI: Zachary Valdez is a 63 y.o. year old male with end stage OA of his left knee with progressively worsening pain and dysfunction. He has constant pain, with activity and at rest and significant functional deficits with difficulties even with ADLs. He has had extensive non-op management including analgesics, injections of cortisone, and home exercise program, but remains in significant pain with significant dysfunction. Radiographs show bone on bone arthritis medial and patellofemoral. He presents now for left Total Knee Arthroplasty.   Laboratory Data: Admission on 09/07/2014, Discharged on 09/09/2014  Component Date Value Ref Range Status  . ABO/RH(D) 09/07/2014 A POS   Final  . Antibody Screen 09/07/2014 NEG   Final  . Sample Expiration 09/07/2014 09/10/2014   Final  . ABO/RH(D) 09/07/2014 A POS   Final  . WBC 09/08/2014 10.2  4.0 - 10.5 K/uL Final  . RBC 09/08/2014 3.72* 4.22 - 5.81 MIL/uL Final  . Hemoglobin 09/08/2014 12.4* 13.0 - 17.0 g/dL Final  . HCT 09/08/2014 38.3* 39.0 - 52.0 % Final  .  MCV 09/08/2014 103.0* 78.0 - 100.0 fL Final  . MCH 09/08/2014 33.3  26.0 - 34.0 pg Final  . MCHC 09/08/2014 32.4  30.0 - 36.0 g/dL Final  . RDW 09/08/2014 12.4  11.5 - 15.5 % Final  . Platelets 09/08/2014 176  150 - 400 K/uL Final  . Sodium 09/08/2014 135* 137 - 147 mEq/L Final  . Potassium 09/08/2014 4.5  3.7 - 5.3 mEq/L Final  . Chloride 09/08/2014 99  96 - 112 mEq/L Final  . CO2 09/08/2014 23  19 - 32 mEq/L Final  . Glucose, Bld 09/08/2014 190* 70 - 99 mg/dL Final  . BUN 09/08/2014 16  6 - 23 mg/dL Final  . Creatinine, Ser 09/08/2014 0.87  0.50 - 1.35 mg/dL Final  . Calcium 09/08/2014 8.7  8.4 - 10.5 mg/dL Final  . GFR calc non Af Amer 09/08/2014 >90  >90 mL/min Final  . GFR calc Af Amer 09/08/2014 >90  >90 mL/min Final   Comment: (NOTE) The eGFR has been calculated using the CKD EPI equation. This calculation has not been validated in all clinical situations. eGFR's persistently <90 mL/min signify possible Chronic Kidney Disease.   . Anion gap 09/08/2014 13  5 - 15 Final  . WBC 09/09/2014 14.5* 4.0 - 10.5 K/uL Final  . RBC 09/09/2014 3.60* 4.22 - 5.81 MIL/uL Final  . Hemoglobin 09/09/2014 12.2* 13.0 - 17.0 g/dL Final  . HCT 09/09/2014 37.7* 39.0 - 52.0 % Final  .  MCV 09/09/2014 104.7* 78.0 - 100.0 fL Final  . MCH 09/09/2014 33.9  26.0 - 34.0 pg Final  . MCHC 09/09/2014 32.4  30.0 - 36.0 g/dL Final  . RDW 09/09/2014 12.5  11.5 - 15.5 % Final  . Platelets 09/09/2014 176  150 - 400 K/uL Final  . Sodium 09/09/2014 137  137 - 147 mEq/L Final  . Potassium 09/09/2014 4.2  3.7 - 5.3 mEq/L Final  . Chloride 09/09/2014 101  96 - 112 mEq/L Final  . CO2 09/09/2014 25  19 - 32 mEq/L Final  . Glucose, Bld 09/09/2014 125* 70 - 99 mg/dL Final  . BUN 09/09/2014 18  6 - 23 mg/dL Final  . Creatinine, Ser 09/09/2014 0.87  0.50 - 1.35 mg/dL Final  . Calcium 09/09/2014 9.0  8.4 - 10.5 mg/dL Final  . GFR calc non Af Amer 09/09/2014 >90  >90 mL/min Final  . GFR calc Af Amer 09/09/2014 >90  >90  mL/min Final   Comment: (NOTE) The eGFR has been calculated using the CKD EPI equation. This calculation has not been validated in all clinical situations. eGFR's persistently <90 mL/min signify possible Chronic Kidney Disease.   Georgiann Hahn gap 09/09/2014 11  5 - 15 Final  Hospital Outpatient Visit on 08/31/2014  Component Date Value Ref Range Status  . aPTT 08/31/2014 29  24 - 37 seconds Final  . WBC 08/31/2014 6.5  4.0 - 10.5 K/uL Final  . RBC 08/31/2014 4.33  4.22 - 5.81 MIL/uL Final  . Hemoglobin 08/31/2014 14.5  13.0 - 17.0 g/dL Final  . HCT 08/31/2014 45.1  39.0 - 52.0 % Final  . MCV 08/31/2014 104.2* 78.0 - 100.0 fL Final  . MCH 08/31/2014 33.5  26.0 - 34.0 pg Final  . MCHC 08/31/2014 32.2  30.0 - 36.0 g/dL Final  . RDW 08/31/2014 12.5  11.5 - 15.5 % Final  . Platelets 08/31/2014 229  150 - 400 K/uL Final  . Sodium 08/31/2014 142  137 - 147 mEq/L Final  . Potassium 08/31/2014 4.3  3.7 - 5.3 mEq/L Final  . Chloride 08/31/2014 104  96 - 112 mEq/L Final  . CO2 08/31/2014 26  19 - 32 mEq/L Final  . Glucose, Bld 08/31/2014 112* 70 - 99 mg/dL Final  . BUN 08/31/2014 21  6 - 23 mg/dL Final  . Creatinine, Ser 08/31/2014 1.29  0.50 - 1.35 mg/dL Final  . Calcium 08/31/2014 9.3  8.4 - 10.5 mg/dL Final  . Total Protein 08/31/2014 7.6  6.0 - 8.3 g/dL Final  . Albumin 08/31/2014 4.0  3.5 - 5.2 g/dL Final  . AST 08/31/2014 32  0 - 37 U/L Final  . ALT 08/31/2014 42  0 - 53 U/L Final  . Alkaline Phosphatase 08/31/2014 55  39 - 117 U/L Final  . Total Bilirubin 08/31/2014 0.4  0.3 - 1.2 mg/dL Final  . GFR calc non Af Amer 08/31/2014 58* >90 mL/min Final  . GFR calc Af Amer 08/31/2014 68* >90 mL/min Final   Comment: (NOTE) The eGFR has been calculated using the CKD EPI equation. This calculation has not been validated in all clinical situations. eGFR's persistently <90 mL/min signify possible Chronic Kidney Disease.   . Anion gap 08/31/2014 12  5 - 15 Final  . Prothrombin Time 08/31/2014  13.5  11.6 - 15.2 seconds Final  . INR 08/31/2014 1.02  0.00 - 1.49 Final  . Color, Urine 08/31/2014 YELLOW  YELLOW Final  . APPearance 08/31/2014 CLEAR  CLEAR Final  .  Specific Gravity, Urine 08/31/2014 1.022  1.005 - 1.030 Final  . pH 08/31/2014 6.0  5.0 - 8.0 Final  . Glucose, UA 08/31/2014 100* NEGATIVE mg/dL Final  . Hgb urine dipstick 08/31/2014 NEGATIVE  NEGATIVE Final  . Bilirubin Urine 08/31/2014 NEGATIVE  NEGATIVE Final  . Ketones, ur 08/31/2014 NEGATIVE  NEGATIVE mg/dL Final  . Protein, ur 08/31/2014 NEGATIVE  NEGATIVE mg/dL Final  . Urobilinogen, UA 08/31/2014 0.2  0.0 - 1.0 mg/dL Final  . Nitrite 08/31/2014 NEGATIVE  NEGATIVE Final  . Leukocytes, UA 08/31/2014 NEGATIVE  NEGATIVE Final   MICROSCOPIC NOT DONE ON URINES WITH NEGATIVE PROTEIN, BLOOD, LEUKOCYTES, NITRITE, OR GLUCOSE <1000 mg/dL.  Marland Kitchen MRSA, PCR 08/31/2014 NEGATIVE  NEGATIVE Final  . Staphylococcus aureus 08/31/2014 POSITIVE* NEGATIVE Final   Comment:        The Xpert SA Assay (FDA approved for NASAL specimens in patients over 29 years of age), is one component of a comprehensive surveillance program.  Test performance has been validated by [employer] for patients greater than or equal to 57 year old. It is not intended to diagnose infection nor to guide or monitor treatment.      X-Rays:Dg Chest 2 View  08/31/2014   CLINICAL DATA:  Preop for left knee surgery.  EXAM: CHEST  2 VIEW  COMPARISON:  None.  FINDINGS: The heart size and mediastinal contours are within normal limits. Both lungs are clear. The visualized skeletal structures are unremarkable.  IMPRESSION: Normal chest x-ray.   Electronically Signed   By: Kalman Jewels M.D.   On: 08/31/2014 15:02    EKG: Orders placed or performed during the hospital encounter of 08/31/14  . EKG 12-Lead  . EKG 12-Lead     Hospital Course: Zachary Valdez is a 63 y.o. who was admitted to [facility]. They were brought to the operating room on 09/07/2014  and underwent Procedure(s): LEFT TOTAL KNEE ARTHROPLASTY.  Patient tolerated the procedure well and was later transferred to the recovery room and then to the orthopaedic floor for postoperative care.  They were given PO and IV analgesics for pain control following their surgery.  They were given 24 hours of postoperative antibiotics of  Anti-infectives    Start     Dose/Rate Route Frequency Ordered Stop   09/07/14 1830  ceFAZolin (ANCEF) IVPB 2 g/50 mL premix     2 g100 mL/hr over 30 Minutes Intravenous Every 6 hours 09/07/14 1627 09/08/14 0046   09/07/14 1015  ceFAZolin (ANCEF) IVPB 2 g/50 mL premix     2 g100 mL/hr over 30 Minutes Intravenous On call to O.R. 09/07/14 8325 09/07/14 1239     and started on DVT prophylaxis in the form of Xarelto.   PT and OT were ordered for total joint protocol.  Discharge planning consulted to help with postop disposition and equipment needs.  Patient had a good night on the evening of surgery.  They started to get up OOB with therapy on day one. Hemovac drain was pulled without difficulty.  Continued to work with therapy into day two.  Dressing was changed on day two and the incision was healing well. Patient was seen in rounds and was ready to go home.  Discharge home with home health Diet - Cardiac diet Follow up - in 2 weeks Activity - WBAT Disposition - Home Condition Upon Discharge - Good D/C Meds - See DC Summary DVT Prophylaxis - Xarelto      Discharge Instructions    Call  MD / Call 911    Complete by:  As directed   If you experience chest pain or shortness of breath, CALL 911 and be transported to the hospital emergency room.  If you develope a fever above 101 F, pus (white drainage) or increased drainage or redness at the wound, or calf pain, call your surgeon's office.     Change dressing    Complete by:  As directed   Change dressing daily with sterile 4 x 4 inch gauze dressing and apply TED hose. Do not submerge the incision under water.      Constipation Prevention    Complete by:  As directed   Drink plenty of fluids.  Prune juice may be helpful.  You may use a stool softener, such as Colace (over the counter) 100 mg twice a day.  Use MiraLax (over the counter) for constipation as needed.     Diet - low sodium heart healthy    Complete by:  As directed      Discharge instructions    Complete by:  As directed   Pick up stool softner and laxative for home. Do not submerge incision under water. May shower. Continue to use ice for pain and swelling from surgery.  Take Xarelto for two and a half more weeks, then discontinue Xarelto. Once the patient has completed the blood thinner regimen, then take a Baby 81 mg Aspirin daily for three more weeks.  Postoperative Constipation Protocol  Constipation - defined medically as fewer than three stools per week and severe constipation as less than one stool per week.  One of the most common issues patients have following surgery is constipation.  Even if you have a regular bowel pattern at home, your normal regimen is likely to be disrupted due to multiple reasons following surgery.  Combination of anesthesia, postoperative narcotics, change in appetite and fluid intake all can affect your bowels.  In order to avoid complications following surgery, here are some recommendations in order to help you during your recovery period.  Colace (docusate) - Pick up an over-the-counter form of Colace or another stool softener and take twice a day as long as you are requiring postoperative pain medications.  Take with a full glass of water daily.  If you experience loose stools or diarrhea, hold the colace until you stool forms back up.  If your symptoms do not get better within 1 week or if they get worse, check with your doctor.  Dulcolax (bisacodyl) - Pick up over-the-counter and take as directed by the product packaging as needed to assist with the movement of your bowels.  Take with a full glass of  water.  Use this product as needed if not relieved by Colace only.   MiraLax (polyethylene glycol) - Pick up over-the-counter to have on hand.  MiraLax is a solution that will increase the amount of water in your bowels to assist with bowel movements.  Take as directed and can mix with a glass of water, juice, soda, coffee, or tea.  Take if you go more than two days without a movement. Do not use MiraLax more than once per day. Call your doctor if you are still constipated or irregular after using this medication for 7 days in a row.  If you continue to have problems with postoperative constipation, please contact the office for further assistance and recommendations.  If you experience "the worst abdominal pain ever" or develop nausea or vomiting, please contact the office immediatly  for further recommendations for treatment.     Do not put a pillow under the knee. Place it under the heel.    Complete by:  As directed      Do not sit on low chairs, stoools or toilet seats, as it may be difficult to get up from low surfaces    Complete by:  As directed      Driving restrictions    Complete by:  As directed   No driving until released by the physician.     Increase activity slowly as tolerated    Complete by:  As directed      Lifting restrictions    Complete by:  As directed   No lifting until released by the physician.     Patient may shower    Complete by:  As directed   You may shower without a dressing once there is no drainage.  Do not wash over the wound.  If drainage remains, do not shower until drainage stops.     TED hose    Complete by:  As directed   Use stockings (TED hose) for 3 weeks on both leg(s).  You may remove them at night for sleeping.     Weight bearing as tolerated    Complete by:  As directed   Laterality:  left  Extremity:  Lower            Medication List    STOP taking these medications        amoxicillin-clavulanate 875-125 MG per tablet  Commonly  known as:  AUGMENTIN     BC HEADACHE POWDER PO     Cyanocobalamin 1000 MCG Tbcr     diclofenac 50 MG EC tablet  Commonly known as:  VOLTAREN     diclofenac sodium 1 % Gel  Commonly known as:  VOLTAREN     ibuprofen 600 MG tablet  Commonly known as:  ADVIL,MOTRIN     vardenafil 20 MG tablet  Commonly known as:  LEVITRA      TAKE these medications        Azilsartan Medoxomil 80 MG Tabs  Commonly known as:  EDARBI  Take 1 tablet (80 mg total) by mouth daily.     clonazePAM 0.25 MG disintegrating tablet  Commonly known as:  KLONOPIN  Take 1 tablet (0.25 mg total) by mouth 2 (two) times daily as needed (anxiety).     fluticasone 50 MCG/ACT nasal spray  Commonly known as:  FLONASE  Place 2 sprays into both nostrils daily.     HYDROmorphone 2 MG tablet  Commonly known as:  DILAUDID  Take 1-2 tablets (2-4 mg total) by mouth every 4 (four) hours as needed for moderate pain or severe pain.     Linaclotide 145 MCG Caps capsule  Commonly known as:  LINZESS  Take 1-2 capsules (145-290 mcg total) by mouth 1 day or 1 dose. Use prn constipation     loratadine 10 MG tablet  Commonly known as:  CLARITIN  Take 1 tablet (10 mg total) by mouth daily.     methocarbamol 500 MG tablet  Commonly known as:  ROBAXIN  Take 1 tablet (500 mg total) by mouth every 6 (six) hours as needed for muscle spasms.     pantoprazole 40 MG tablet  Commonly known as:  PROTONIX  Take 1 tablet (40 mg total) by mouth daily.     rivaroxaban 10 MG Tabs tablet  Commonly known as:  XARELTO  -  Take 1 tablet (10 mg total) by mouth daily with breakfast. Take Xarelto for two and a half more weeks, then discontinue Xarelto.  - Once the patient has completed the blood thinner regimen, then take a Baby 81 mg Aspirin daily for three more weeks.     rosuvastatin 20 MG tablet  Commonly known as:  CRESTOR  Take 20 mg by mouth every other day. In the evening     rosuvastatin 40 MG tablet  Commonly known as:   CRESTOR  Take 1 tablet (40 mg total) by mouth daily.     traMADol 50 MG tablet  Commonly known as:  ULTRAM  Take 1-2 tablets (50-100 mg total) by mouth every 6 (six) hours as needed (mild pain).     triamcinolone cream 0.5 %  Commonly known as:  KENALOG  Apply topically 3 (three) times daily.       Follow-up Information    Follow up with [facility].   Why:  home health physical therapy   Contact information:   [address] [place] [address] [phone]       Follow up with Gearlean Alf, MD. Schedule an appointment as soon as possible for a visit in 2 weeks.   Specialty:  Orthopedic Surgery   Why:  Call office at [phone] to set up appointment with Dr. Anne Fu PAs, Luetta Nutting or Dian Situ, for follow up.   Contact information:   [address] [place] [address] [phone]       Signed: Arlee Muslim, PA-C Orthopaedic Surgery 09/10/2014, 8:40 AM

## 2014-09-08 NOTE — Plan of Care (Signed)
Problem: Phase I Progression Outcomes Goal: Vital signs/hemodynamically stable Outcome: Completed/Met Date Met:  09/08/14

## 2014-09-08 NOTE — Evaluation (Signed)
Physical Therapy Evaluation Patient Details Name: Zachary Valdez MRN: [id number] DOB: 07-11-1951 Today's Date: 09/08/2014   History of Present Illness  L tka  Clinical Impression  Patient progressed well. Patient wishes to return to his home, states brother in law can stay a few nights. If goes to parents' home, he will have 3 levels to negotiate between bedroom, kitchen, den. Patient will benefit from PT to address problems listed in note below.    Follow Up Recommendations Home health PT;Supervision/Assistance - 24 hour    Equipment Recommendations  None recommended by PT    Recommendations for Other Services       Precautions / Restrictions Precautions Precautions: Knee;Fall Required Braces or Orthoses: Knee Immobilizer - Left Knee Immobilizer - Left: Discontinue once straight leg raise with < 10 degree lag      Mobility  Bed Mobility Overal bed mobility: Needs Assistance Bed Mobility: Supine to Sit     Supine to sit: Supervision     General bed mobility comments: cues for technique  Transfers Overall transfer level: Needs assistance Equipment used: Rolling walker (2 wheeled) Transfers: Sit to/from Stand Sit to Stand: Min guard         General transfer comment: cues for hand and L leg position,, stressed to keep RW at hand as patient started to walk away from it,  Ambulation/Gait Ambulation/Gait assistance: Min guard Ambulation Distance (Feet): 150 Feet Assistive device: Rolling walker (2 wheeled) Gait Pattern/deviations: Step-to pattern;Step-through pattern;Antalgic     General Gait Details: cues for safety, sequence  Stairs            Wheelchair Mobility    Modified Rankin (Stroke Patients Only)       Balance                                             Pertinent Vitals/Pain Pain Assessment: 0-10 Pain Score: 5  Pain Descriptors / Indicators: Aching;Discomfort;Throbbing Pain Intervention(s): Premedicated before  session;Ice applied;Monitored during session;Repositioned    Home Living Family/patient expects to be discharged to:: Private residence Living Arrangements: Alone Available Help at Discharge: Family;Available PRN/intermittently Type of Home: House Home Access: Stairs to enter   Entrance Stairs-Number of Steps: 1 + 2 Home Layout: One level Home Equipment: Walker - 2 wheels;Cane - single point;Bedside commode;Tub bench Additional Comments: states that his brother in law can stay for first 2 nights if needed. Other option is to go to parents home which is a split level with about 3 levels between bedroom , kitchen and den. Father is home with nearly 24/caregivers, mother there, both ar e in their 58's    Prior Function Level of Independence: Independent               Hand Dominance        Extremity/Trunk Assessment               Lower Extremity Assessment: LLE deficits/detail   LLE Deficits / Details: performs a SLR with no assist  Cervical / Trunk Assessment: Normal  Communication   Communication: No difficulties  Cognition Arousal/Alertness: Awake/alert Behavior During Therapy: WFL for tasks assessed/performed Overall Cognitive Status: Within Functional Limits for tasks assessed                      General Comments      Exercises Total Joint Exercises Ankle  Circles/Pumps: AROM;Both;10 reps;Supine Quad Sets: AROM;Left;10 reps;Supine Heel Slides: AAROM;Left;10 reps;Supine Straight Leg Raises: AAROM;Left;10 reps;Supine Goniometric ROM: 10-45 l knee      Assessment/Plan    PT Assessment    PT Diagnosis Difficulty walking;Acute pain   PT Problem List    PT Treatment Interventions     PT Goals (Current goals can be found in the Care Plan section) Acute Rehab PT Goals Patient Stated Goal: I want to go to my house. PT Goal Formulation: With patient Time For Goal Achievement: 09/11/14 Potential to Achieve Goals: Good    Frequency      Barriers to discharge        Co-evaluation               End of Session Equipment Utilized During Treatment: Left knee immobilizer Activity Tolerance: Patient tolerated treatment well Patient left: in chair;with call bell/phone within reach Nurse Communication: Mobility status         Time: 0927-1002 PT Time Calculation (min) (ACUTE ONLY): 35 min   Charges:     PT Treatments $Gait Training: 8-22 mins $Therapeutic Exercise: 8-22 mins   PT G Codes:          Claretha Cooper 09/08/2014, 10:35 AM Tresa Endo PT [phone]

## 2014-09-08 NOTE — Plan of Care (Signed)
Problem: Phase I Progression Outcomes Goal: OOB as tolerated unless otherwise ordered Outcome: Completed/Met Date Met:  09/08/14     

## 2014-09-08 NOTE — Care Management Note (Addendum)
    Page 1 of 2   09/09/2014     1:57:28 PM CARE MANAGEMENT NOTE 09/09/2014  Patient:  Zachary Valdez, Zachary Valdez   Account Number:  [id number]  Date Initiated:  09/08/2014  Documentation initiated by:  [facility]  Subjective/Objective Assessment:   adm: LEFT TOTAL KNEE ARTHROPLASTY (Left)     Action/Plan:   discharge planning   Anticipated DC Date:  09/08/2014   Anticipated DC Plan:  [place]  CM consult      [facility] [facility]  HOME HEALTH   Choice offered to / List presented to:  C-1 Patient   DME arranged  NA      DME agency  NA     [place] arranged  HH-2 PT      [place]   Status of service:  Completed, signed off Medicare Important Message given?   (If response is "NO", the following Medicare IM given date fields will be blank) Date Medicare IM given:   Medicare IM given by:   Date Additional Medicare IM given:   Additional Medicare IM given by:    Discharge Disposition:  [place]  Per UR Regulation:  Reviewed for med. necessity/level of care/duration of stay  If discussed at [place] of Stay Meetings, dates discussed:    Comments:  09/09/14 09:30 CM met with pt who states he will be going to his parents address [place], [address] for [place].  CM called Shaune Leeks to notify of address change.  No other CM needs were communicated.  Mariane Masters, BSN, Jearld Lesch [phone].  09/08/14 08:15 CM met with pt in room to offer choice of home health agency.  Pt chooses [facility] to render HHPT.  Pt DOES NOT NEED any DME.  Address and contact information verified with pt. Referral called to [employer] rep, Stanton Kidney.  No other CM needs were communicated.  Mariane Masters, Durel Salts [phone].

## 2014-09-08 NOTE — Plan of Care (Signed)
Problem: Phase I Progression Outcomes Goal: Incision/dressings dry and intact Outcome: Completed/Met Date Met:  09/08/14

## 2014-09-08 NOTE — Plan of Care (Signed)
Problem: Phase I Progression Outcomes Goal: Pain controlled with appropriate interventions Outcome: Completed/Met Date Met:  09/08/14     

## 2014-09-09 LAB — CBC
HCT: 37.7 % — ABNORMAL LOW (ref 39.0–52.0)
HEMOGLOBIN: 12.2 g/dL — AB (ref 13.0–17.0)
MCH: 33.9 pg (ref 26.0–34.0)
MCHC: 32.4 g/dL (ref 30.0–36.0)
MCV: 104.7 fL — AB (ref 78.0–100.0)
PLATELETS: 176 10*3/uL (ref 150–400)
RBC: 3.6 MIL/uL — AB (ref 4.22–5.81)
RDW: 12.5 % (ref 11.5–15.5)
WBC: 14.5 10*3/uL — ABNORMAL HIGH (ref 4.0–10.5)

## 2014-09-09 LAB — BASIC METABOLIC PANEL
Anion gap: 11 (ref 5–15)
BUN: 18 mg/dL (ref 6–23)
CHLORIDE: 101 meq/L (ref 96–112)
CO2: 25 meq/L (ref 19–32)
CREATININE: 0.87 mg/dL (ref 0.50–1.35)
Calcium: 9 mg/dL (ref 8.4–10.5)
GFR calc Af Amer: >90 mL/min (ref >90)
GFR calc non Af Amer: >90 mL/min (ref >90)
Glucose, Bld: 125 mg/dL — ABNORMAL HIGH (ref 70–99)
Potassium: 4.2 mEq/L (ref 3.7–5.3)
Sodium: 137 mEq/L (ref 137–147)

## 2014-09-09 NOTE — Plan of Care (Signed)
Problem: Acute Rehab OT Goals (only OT should resolve) Goal: Pt. Will Transfer To Toilet Outcome: Completed/Met Date Met:  09/09/14 Goal: Pt. Will Perform Toileting-Clothing Manipulation Outcome: Completed/Met Date Met:  09/09/14

## 2014-09-09 NOTE — Progress Notes (Signed)
   Subjective: 2 Days Post-Op Procedure(s) (LRB): LEFT TOTAL KNEE ARTHROPLASTY (Left) Patient reports pain as mild.   Patient seen in rounds with Dr. Wynelle Link. Patient is well, and has had no acute complaints or problems Patient is ready to go home  Objective: Vital signs in last 24 hours: Temp:  [97.9 F (36.6 C)-99.6 F (37.6 C)] 98 F (36.7 C) (12/09 0434) Pulse Rate:  [65-91] 91 (12/09 0434) Resp:  [14-20] 16 (12/09 0434) BP: (105-144)/(58-93) 144/93 mmHg (12/09 0434) SpO2:  [95 %-100 %] 95 % (12/09 0434)  Intake/Output from previous day:  Intake/Output Summary (Last 24 hours) at 09/09/14 1031 Last data filed at 09/09/14 0700  Gross per 24 hour  Intake    960 ml  Output   1525 ml  Net   -565 ml     Labs:  Recent Labs  09/08/14 0445 09/09/14 0510  HGB 12.4* 12.2*    Recent Labs  09/08/14 0445 09/09/14 0510  WBC 10.2 14.5*  RBC 3.72* 3.60*  HCT 38.3* 37.7*  PLT 176 176    Recent Labs  09/08/14 0445 09/09/14 0510  NA 135* 137  K 4.5 4.2  CL 99 101  CO2 23 25  BUN 16 18  CREATININE 0.87 0.87  GLUCOSE 190* 125*  CALCIUM 8.7 9.0   No results for input(s): LABPT, INR in the last 72 hours.  EXAM: General - Patient is Alert, Appropriate and Oriented Extremity - Neurovascular intact Sensation intact distally Dorsiflexion/Plantar flexion intact Incision - clean, dry, no drainage Motor Function - intact, moving foot and toes well on exam.   Assessment/Plan: 2 Days Post-Op Procedure(s) (LRB): LEFT TOTAL KNEE ARTHROPLASTY (Left) Procedure(s) (LRB): LEFT TOTAL KNEE ARTHROPLASTY (Left) Past Medical History  Diagnosis Date  . Hyperlipidemia   . Hypertension   . OA (osteoarthritis)   . Vitamin B 12 deficiency   . Allergic rhinitis   . GERD (gastroesophageal reflux disease)   . Ulcerative esophagitis 2006    Dr. Fuller Plan  . Hyperplastic colon polyp 2006  . Obesity   . Sleep apnea     cpap-   . Anxiety    Principal Problem:   OA  (osteoarthritis) of knee  Estimated body mass index is 33.12 kg/(m^2) as calculated from the following:   Height as of this encounter: 6\' 1"  (1.854 m).   Weight as of this encounter: 113.853 kg (251 lb). Up with therapy Discharge home with home health Diet - Cardiac diet Follow up - in 2 weeks Activity - WBAT Disposition - Home Condition Upon Discharge - Good D/C Meds - See DC Summary DVT Prophylaxis - Xarelto  Arlee Muslim, PA-C Orthopaedic Surgery 09/09/2014, 10:31 AM

## 2014-09-09 NOTE — Progress Notes (Signed)
Physical Therapy Treatment Patient Details Name: Zachary Valdez MRN: [id number] DOB: [birth year] Today's Date: 09/09/2014    History of Present Illness L tka    PT Comments    Patient is doing well. Ready for DC to home  Follow Up Recommendations  Home health PT;Supervision/Assistance - 24 hour     Equipment Recommendations       Recommendations for Other Services       Precautions / Restrictions Precautions Precautions: Knee;Fall Restrictions Weight Bearing Restrictions: No    Mobility  Bed Mobility                  Transfers Overall transfer level: Needs assistance Equipment used: Rolling walker (2 wheeled) Transfers: Sit to/from Stand Sit to Stand: Modified independent (Device/Increase time)            Ambulation/Gait Ambulation/Gait assistance: Supervision Ambulation Distance (Feet): 200 Feet Assistive device: Rolling walker (2 wheeled) Gait Pattern/deviations: Step-through pattern;Antalgic     General Gait Details: cues for safety, sequence   Stairs Stairs: Yes   Stair Management: No rails;Step to pattern Number of Stairs: 2 General stair comments: pt uses  rails on stoop, performed well.  Wheelchair Mobility    Modified Rankin (Stroke Patients Only)       Balance                                    Cognition Arousal/Alertness: Awake/alert Behavior During Therapy: WFL for tasks assessed/performed Overall Cognitive Status: Within Functional Limits for tasks assessed                      Exercises Total Joint Exercises Ankle Circles/Pumps: AROM;Both;10 reps;Supine Quad Sets: AROM;Left;10 reps;Supine Short Arc Quad: AAROM;Left;10 reps;Supine Heel Slides: AAROM;Left;10 reps;Supine Hip ABduction/ADduction: AROM;Left;10 reps;Supine Straight Leg Raises: Left;10 reps;Supine;AROM Long Arc Quad: AROM;Left;10 reps;Seated Knee Flexion: AROM;Left;10 reps;Seated Goniometric ROM: 10-60 l knee    General Comments         Pertinent Vitals/Pain Pain Score: 2  Pain Location: l knee Pain Descriptors / Indicators: Aching    Home Living                      Prior Function            PT Goals (current goals can now be found in the care plan section) Progress towards PT goals: Progressing toward goals    Frequency       PT Plan Current plan remains appropriate    Co-evaluation             End of Session   Activity Tolerance: Patient tolerated treatment well Patient left: in chair     Time: 3662-9476 PT Time Calculation (min) (ACUTE ONLY): 36 min  Charges:  $Gait Training: 8-22 mins $Therapeutic Exercise: 8-22 mins                    G Codes:      Claretha Cooper 09/09/2014, 1:09 PM

## 2014-09-09 NOTE — Progress Notes (Signed)
Occupational Therapy Treatment Patient Details Name: Zachary Valdez MRN: [id number] DOB: [birth year] Today's Date: 09/09/2014    History of present illness L tka   OT comments  Pt doing well and supposed to d/c later today. Pt did well with AE and states he will likely obtain reacher from gift shop and can access shoe horn and sock aid from parent's.    Follow Up Recommendations  No OT follow up    Equipment Recommendations  None recommended by OT    Recommendations for Other Services      Precautions / Restrictions Precautions Precautions: Knee;Fall Restrictions Weight Bearing Restrictions: No       Mobility Bed Mobility                  Transfers Overall transfer level: Needs assistance Equipment used: Rolling walker (2 wheeled) Transfers: Sit to/from Stand Sit to Stand: Supervision              Balance                                   ADL                           Toilet Transfer: Supervision/safety;Ambulation;RW   Toileting- Clothing Manipulation and Hygiene: Supervision/safety;Sit to/from stand         General ADL Comments: Pt stating he would like to don his own socks and shoes. He used sock aid to don socks as he couldnt reach down to L foot all the way today. Donned with sock aid and supervision. He had tie shoes and was able to don with reacher and shoe horn with min assist to manage laces. He states he has access to a shoe horn and sock aid from his parents. He states they are using the reachers so he states he will lkely obtain one from the gift shop. Explained uses of reacher for retrieving objects, adjusting covers also. Called down to dept supervisor and no walker bag available. Suggested pt talk with [facility] to see if they have one available if desired. Pt able to doff socks with reacher with supervision. Offered elastic laces but pt states he will likely just wear slip on shoes at home until he can tie laces. Pt  moving well today.       Vision                     Perception     Praxis      Cognition   Behavior During Therapy: WFL for tasks assessed/performed Overall Cognitive Status: Within Functional Limits for tasks assessed                       Extremity/Trunk Assessment               Exercises     Shoulder Instructions       General Comments      Pertinent Vitals/ Pain          Home Living                                          Prior Functioning/Environment              Frequency  Min 2X/week     Progress Toward Goals  OT Goals(current goals can now be found in the care plan section)  Progress towards OT goals: Progressing toward goals     Plan Discharge plan remains appropriate    Co-evaluation                 End of Session Equipment Utilized During Treatment: Rolling walker   Activity Tolerance Patient tolerated treatment well   Patient Left in chair;with call bell/phone within reach   Nurse Communication          Time: 1030-1058 OT Time Calculation (min): 28 min  Charges: OT General Charges $OT Visit: 1 Procedure OT Treatments $Self Care/Home Management : 8-22 mins $Therapeutic Activity: 8-22 mins  Jules Schick  [phone] 09/09/2014, 12:06 PM

## 2014-09-10 NOTE — Progress Notes (Signed)
Discharge summary sent to payer through [facility]  

## 2014-12-14 ENCOUNTER — Other Ambulatory Visit: Payer: Self-pay | Admitting: Internal Medicine

## 2014-12-25 ENCOUNTER — Other Ambulatory Visit: Payer: Self-pay | Admitting: Internal Medicine

## 2014-12-28 ENCOUNTER — Encounter: Payer: 59 | Admitting: Internal Medicine

## 2015-01-06 ENCOUNTER — Encounter: Payer: Self-pay | Admitting: Internal Medicine

## 2015-01-11 ENCOUNTER — Other Ambulatory Visit: Payer: Self-pay | Admitting: Internal Medicine

## 2015-01-15 ENCOUNTER — Encounter: Payer: Self-pay | Admitting: Internal Medicine

## 2015-01-15 ENCOUNTER — Ambulatory Visit (INDEPENDENT_AMBULATORY_CARE_PROVIDER_SITE_OTHER): Payer: 59 | Admitting: Internal Medicine

## 2015-01-15 VITALS — BP 130/78 | HR 90 | Ht 72.0 in | Wt 258.0 lb

## 2015-01-15 DIAGNOSIS — E538 Deficiency of other specified B group vitamins: Secondary | ICD-10-CM

## 2015-01-15 DIAGNOSIS — E785 Hyperlipidemia, unspecified: Secondary | ICD-10-CM

## 2015-01-15 DIAGNOSIS — Z0000 Encounter for general adult medical examination without abnormal findings: Secondary | ICD-10-CM

## 2015-01-15 MED ORDER — LORATADINE 10 MG PO TABS: 10.0000 mg | ORAL_TABLET | Freq: Every day | ORAL | Status: DC

## 2015-01-15 MED ORDER — PANTOPRAZOLE SODIUM 40 MG PO TBEC: 40.0000 mg | DELAYED_RELEASE_TABLET | Freq: Every day | ORAL | Status: DC

## 2015-01-15 MED ORDER — TRIAMCINOLONE ACETONIDE 0.5 % EX CREA: TOPICAL_CREAM | Freq: Three times a day (TID) | CUTANEOUS | Status: DC

## 2015-01-15 MED ORDER — FLUTICASONE PROPIONATE 50 MCG/ACT NA SUSP: 2.0000 | Freq: Every day | NASAL | Status: DC

## 2015-01-15 NOTE — Assessment & Plan Note (Signed)
Labs PO B12

## 2015-01-15 NOTE — Assessment & Plan Note (Signed)
Crestor Labs 

## 2015-01-15 NOTE — Progress Notes (Signed)
Pre visit review using our clinic review tool, if applicable. No additional management support is needed unless otherwise documented below in the visit note. 

## 2015-01-15 NOTE — Progress Notes (Signed)
Subjective:  HPI  The patient is here for a wellness exam.   The patient has been doing well overall without major physical or psychological issues going on lately, except for sinusitis sx's - better off dairy.. The patient needs to address  chronic hypertension that has been well controlled with Edarbi; to address chronic  hyperlipidemia controlled with medicines as well; and to address B12 def, diabetes, ED controlled with medical treatment and diet. C/o stress - caring for older parents   Wt Readings from Last 3 Encounters:  01/15/15 258 lb (117.028 kg)  09/07/14 251 lb (113.853 kg)  08/15/14 248 lb (112.492 kg)   BP Readings from Last 3 Encounters:  01/15/15 130/78  09/09/14 123/78  08/15/14 140/80     Review of Systems  Constitutional: Negative for appetite change, fatigue and unexpected weight change.  HENT: Negative for congestion, nosebleeds, sneezing, sore throat and trouble swallowing.   Eyes: Negative for itching and visual disturbance.  Respiratory: Negative for cough.   Cardiovascular: Negative for chest pain, palpitations and leg swelling.  Gastrointestinal: Negative for nausea, diarrhea, blood in stool and abdominal distention.  Genitourinary: Negative for frequency and hematuria.  Musculoskeletal: Positive for arthralgias (knees hurt). Negative for back pain, joint swelling, gait problem and neck pain.  Skin: Negative for rash.  Neurological: Negative for dizziness, tremors, speech difficulty and weakness.  Psychiatric/Behavioral: Negative for sleep disturbance, dysphoric mood and agitation. The patient is not nervous/anxious.        Objective:   Physical Exam  Constitutional: He is oriented to person, place, and time. He appears well-developed.  obese  HENT:  Mouth/Throat: Oropharynx is clear and moist.  Eyes: Conjunctivae are normal. Pupils are equal, round, and reactive to light.  Neck: Normal range of motion. No JVD present. No thyromegaly present.   Cardiovascular: Normal rate, regular rhythm, normal heart sounds and intact distal pulses.  Exam reveals no gallop and no friction rub.   No murmur heard. Pulmonary/Chest: Effort normal and breath sounds normal. No respiratory distress. He has no wheezes. He has no rales. He exhibits no tenderness.  Abdominal: Soft. Bowel sounds are normal. He exhibits no distension and no mass. There is no tenderness. There is no rebound and no guarding.  Genitourinary: Rectum normal, prostate normal and penis normal. Guaiac negative stool.  Musculoskeletal: Normal range of motion. He exhibits no edema or tenderness.  Lymphadenopathy:    He has no cervical adenopathy.  Neurological: He is alert and oriented to person, place, and time. He has normal reflexes. No cranial nerve deficit. He exhibits normal muscle tone. Coordination normal.  Skin: Skin is warm and dry. No rash noted.  Psychiatric: He has a normal mood and affect. His behavior is normal. Judgment and thought content normal.     Lab Results  Component Value Date   WBC 14.5* 09/09/2014   HGB 12.2* 09/09/2014   HCT 37.7* 09/09/2014   PLT 176 09/09/2014   GLUCOSE 125* 09/09/2014   CHOL 189 05/21/2014   TRIG 180.0* 05/21/2014   HDL 45.60 05/21/2014   LDLDIRECT 146.2 10/07/2010   LDLCALC 107* 05/21/2014   ALT 42 08/31/2014   AST 32 08/31/2014   NA 137 09/09/2014   K 4.2 09/09/2014   CL 101 09/09/2014   CREATININE 0.87 09/09/2014   BUN 18 09/09/2014   CO2 25 09/09/2014   TSH 3.01 12/19/2013   PSA 0.55 12/19/2013   INR 1.02 08/31/2014   HGBA1C 5.7 05/21/2014  Assessment & Plan:

## 2015-01-15 NOTE — Assessment & Plan Note (Signed)
We discussed age appropriate health related issues, including available/recomended screening tests and vaccinations. We discussed a need for adhering to healthy diet and exercise. Labs/EKG were reviewed/ordered. All questions were answered.   

## 2015-02-14 ENCOUNTER — Other Ambulatory Visit: Payer: Self-pay | Admitting: Internal Medicine

## 2015-02-26 ENCOUNTER — Ambulatory Visit: Payer: 59 | Admitting: Internal Medicine

## 2015-03-10 ENCOUNTER — Other Ambulatory Visit (INDEPENDENT_AMBULATORY_CARE_PROVIDER_SITE_OTHER): Payer: 59

## 2015-03-10 DIAGNOSIS — E538 Deficiency of other specified B group vitamins: Secondary | ICD-10-CM | POA: Diagnosis not present

## 2015-03-10 DIAGNOSIS — E785 Hyperlipidemia, unspecified: Secondary | ICD-10-CM | POA: Diagnosis not present

## 2015-03-10 DIAGNOSIS — Z0000 Encounter for general adult medical examination without abnormal findings: Secondary | ICD-10-CM | POA: Diagnosis not present

## 2015-03-10 LAB — CBC WITH DIFFERENTIAL/PLATELET
Basophils Absolute: 0 10*3/uL (ref 0.0–0.1)
Basophils Relative: 0.4 % (ref 0.0–3.0)
EOS PCT: 4.8 % (ref 0.0–5.0)
Eosinophils Absolute: 0.3 10*3/uL (ref 0.0–0.7)
HCT: 43.8 % (ref 39.0–52.0)
Hemoglobin: 14.6 g/dL (ref 13.0–17.0)
Lymphocytes Relative: 24.6 % (ref 12.0–46.0)
Lymphs Abs: 1.4 10*3/uL (ref 0.7–4.0)
MCHC: 33.3 g/dL (ref 30.0–36.0)
MCV: 101.8 fl — ABNORMAL HIGH (ref 78.0–100.0)
Monocytes Absolute: 0.7 10*3/uL (ref 0.1–1.0)
Monocytes Relative: 12.2 % — ABNORMAL HIGH (ref 3.0–12.0)
Neutro Abs: 3.4 10*3/uL (ref 1.4–7.7)
Neutrophils Relative %: 58 % (ref 43.0–77.0)
Platelets: 189 10*3/uL (ref 150.0–400.0)
RBC: 4.3 Mil/uL (ref 4.22–5.81)
RDW: 13.6 % (ref 11.5–15.5)
WBC: 5.9 10*3/uL (ref 4.0–10.5)

## 2015-03-10 LAB — BASIC METABOLIC PANEL
BUN: 24 mg/dL — AB (ref 6–23)
CO2: 27 meq/L (ref 19–32)
Calcium: 9 mg/dL (ref 8.4–10.5)
Chloride: 105 mEq/L (ref 96–112)
Creatinine, Ser: 1 mg/dL (ref 0.40–1.50)
GFR: 80.45 mL/min (ref >60.00)
Glucose, Bld: 116 mg/dL — ABNORMAL HIGH (ref 70–99)
POTASSIUM: 4.2 meq/L (ref 3.5–5.1)
Sodium: 138 mEq/L (ref 135–145)

## 2015-03-10 LAB — HEPATIC FUNCTION PANEL
ALK PHOS: 48 U/L (ref 39–117)
ALT: 49 U/L (ref 0–53)
AST: 36 U/L (ref 0–37)
Albumin: 4.3 g/dL (ref 3.5–5.2)
BILIRUBIN DIRECT: 0.1 mg/dL (ref 0.0–0.3)
Total Bilirubin: 0.6 mg/dL (ref 0.2–1.2)
Total Protein: 7.1 g/dL (ref 6.0–8.3)

## 2015-03-10 LAB — URINALYSIS
Bilirubin Urine: NEGATIVE
HGB URINE DIPSTICK: NEGATIVE
KETONES UR: NEGATIVE
Leukocytes, UA: NEGATIVE
Nitrite: NEGATIVE
PH: 6 (ref 5.0–8.0)
Specific Gravity, Urine: 1.025 (ref 1.000–1.030)
TOTAL PROTEIN, URINE-UPE24: NEGATIVE
URINE GLUCOSE: NEGATIVE
UROBILINOGEN UA: 0.2 (ref 0.0–1.0)

## 2015-03-10 LAB — LIPID PANEL
CHOLESTEROL: 175 mg/dL (ref 0–200)
HDL: 39.6 mg/dL (ref >39.00)
LDL Cholesterol: 106 mg/dL — ABNORMAL HIGH (ref 0–99)
NonHDL: 135.4
TRIGLYCERIDES: 147 mg/dL (ref 0.0–149.0)
Total CHOL/HDL Ratio: 4
VLDL: 29.4 mg/dL (ref 0.0–40.0)

## 2015-03-10 LAB — T4, FREE: Free T4: 0.79 ng/dL (ref 0.60–1.60)

## 2015-03-10 LAB — PSA: PSA: 0.4 ng/mL (ref 0.10–4.00)

## 2015-03-10 LAB — HEMOGLOBIN A1C: Hgb A1c MFr Bld: 5.6 % (ref 4.6–6.5)

## 2015-03-10 LAB — TSH: TSH: 3.31 u[IU]/mL (ref 0.35–4.50)

## 2015-03-10 LAB — VITAMIN B12: Vitamin B-12: 440 pg/mL (ref 211–911)

## 2015-03-12 ENCOUNTER — Encounter: Payer: Self-pay | Admitting: Internal Medicine

## 2015-03-12 ENCOUNTER — Ambulatory Visit (INDEPENDENT_AMBULATORY_CARE_PROVIDER_SITE_OTHER): Payer: 59 | Admitting: Internal Medicine

## 2015-03-12 VITALS — BP 130/82 | HR 72 | Wt 262.0 lb

## 2015-03-12 DIAGNOSIS — I10 Essential (primary) hypertension: Secondary | ICD-10-CM | POA: Diagnosis not present

## 2015-03-12 DIAGNOSIS — E538 Deficiency of other specified B group vitamins: Secondary | ICD-10-CM | POA: Diagnosis not present

## 2015-03-12 DIAGNOSIS — N528 Other male erectile dysfunction: Secondary | ICD-10-CM

## 2015-03-12 DIAGNOSIS — K219 Gastro-esophageal reflux disease without esophagitis: Secondary | ICD-10-CM | POA: Diagnosis not present

## 2015-03-12 MED ORDER — VITAMIN B-12 1000 MCG PO TABS: 1000.0000 ug | ORAL_TABLET | Freq: Every day | ORAL | Status: DC

## 2015-03-12 MED ORDER — VITAMIN D 1000 UNITS PO TABS: 1000.0000 [IU] | ORAL_TABLET | Freq: Every day | ORAL | Status: AC

## 2015-03-12 NOTE — Progress Notes (Signed)
Pre visit review using our clinic review tool, if applicable. No additional management support is needed unless otherwise documented below in the visit note. 

## 2015-03-12 NOTE — Progress Notes (Signed)
Subjective:  HPI  The patient is here for a f/u. S/p TKR 12/15 left. He changed jobs - no need to walk as much   The patient has been doing well overall without major physical or psychological issues going on lately, except for sinusitis sx's - better off dairy.. The patient needs to address  chronic hypertension that has been well controlled with Edarbi; to address chronic  hyperlipidemia controlled with medicines as well; and to address B12 def, diabetes, ED controlled with medical treatment and diet. F/u stress - caring for older mother and father   Wt Readings from Last 3 Encounters:  03/12/15 262 lb (118.842 kg)  01/15/15 258 lb (117.028 kg)  09/07/14 251 lb (113.853 kg)   BP Readings from Last 3 Encounters:  03/12/15 130/82  01/15/15 130/78  09/09/14 123/78     Review of Systems  Constitutional: Negative for appetite change, fatigue and unexpected weight change.  HENT: Negative for congestion, nosebleeds, sneezing, sore throat and trouble swallowing.   Eyes: Negative for itching and visual disturbance.  Respiratory: Negative for cough.   Cardiovascular: Negative for chest pain, palpitations and leg swelling.  Gastrointestinal: Negative for nausea, diarrhea, blood in stool and abdominal distention.  Genitourinary: Negative for frequency and hematuria.  Musculoskeletal: Positive for arthralgias (knees hurt). Negative for back pain, joint swelling, gait problem and neck pain.  Skin: Negative for rash.  Neurological: Negative for dizziness, tremors, speech difficulty and weakness.  Psychiatric/Behavioral: Negative for sleep disturbance, dysphoric mood and agitation. The patient is not nervous/anxious.    BP 130/82 mmHg  Pulse 72  Wt 262 lb (118.842 kg)  SpO2 98%     Objective:   Physical Exam  Constitutional: He is oriented to person, place, and time. He appears well-developed.  obese  HENT:  Mouth/Throat: Oropharynx is clear and moist.  Eyes: Conjunctivae are  normal. Pupils are equal, round, and reactive to light.  Neck: Normal range of motion. No JVD present. No thyromegaly present.  Cardiovascular: Normal rate, regular rhythm, normal heart sounds and intact distal pulses.  Exam reveals no gallop and no friction rub.   No murmur heard. Pulmonary/Chest: Effort normal and breath sounds normal. No respiratory distress. He has no wheezes. He has no rales. He exhibits no tenderness.  Abdominal: Soft. Bowel sounds are normal. He exhibits no distension and no mass. There is no tenderness. There is no rebound and no guarding.  Genitourinary: Rectum normal, prostate normal and penis normal. Guaiac negative stool.  Musculoskeletal: Normal range of motion. He exhibits no edema or tenderness.  Lymphadenopathy:    He has no cervical adenopathy.  Neurological: He is alert and oriented to person, place, and time. He has normal reflexes. No cranial nerve deficit. He exhibits normal muscle tone. Coordination normal.  Skin: Skin is warm and dry. No rash noted.  Psychiatric: He has a normal mood and affect. His behavior is normal. Judgment and thought content normal.  L knee w/a scar   Lab Results  Component Value Date   WBC 5.9 03/10/2015   HGB 14.6 03/10/2015   HCT 43.8 03/10/2015   PLT 189.0 03/10/2015   GLUCOSE 116* 03/10/2015   CHOL 175 03/10/2015   TRIG 147.0 03/10/2015   HDL 39.60 03/10/2015   LDLDIRECT 146.2 10/07/2010   LDLCALC 106* 03/10/2015   ALT 49 03/10/2015   AST 36 03/10/2015   NA 138 03/10/2015   K 4.2 03/10/2015   CL 105 03/10/2015   CREATININE 1.00 03/10/2015   BUN 24*  03/10/2015   CO2 27 03/10/2015   TSH 3.31 03/10/2015   PSA 0.40 03/10/2015   INR 1.02 08/31/2014   HGBA1C 5.6 03/10/2015        Assessment & Plan:

## 2015-03-13 NOTE — Assessment & Plan Note (Signed)
On B12 

## 2015-03-13 NOTE — Assessment & Plan Note (Signed)
On Edarbi 

## 2015-03-13 NOTE — Assessment & Plan Note (Signed)
Chronic, organic Levitra prn

## 2015-03-13 NOTE — Assessment & Plan Note (Signed)
On Protonix 

## 2015-03-15 ENCOUNTER — Other Ambulatory Visit: Payer: Self-pay | Admitting: Internal Medicine

## 2015-05-25 ENCOUNTER — Encounter: Payer: Self-pay | Admitting: Gastroenterology

## 2015-06-02 ENCOUNTER — Other Ambulatory Visit: Payer: Self-pay | Admitting: Internal Medicine

## 2015-06-10 ENCOUNTER — Other Ambulatory Visit: Payer: Self-pay | Admitting: *Deleted

## 2015-06-10 MED ORDER — DICLOFENAC SODIUM 50 MG PO TBEC: 50.0000 mg | DELAYED_RELEASE_TABLET | Freq: Two times a day (BID) | ORAL | Status: DC | PRN

## 2015-06-18 ENCOUNTER — Other Ambulatory Visit: Payer: Self-pay | Admitting: Internal Medicine

## 2015-09-10 ENCOUNTER — Ambulatory Visit (INDEPENDENT_AMBULATORY_CARE_PROVIDER_SITE_OTHER): Payer: 59 | Admitting: Internal Medicine

## 2015-09-10 ENCOUNTER — Other Ambulatory Visit (INDEPENDENT_AMBULATORY_CARE_PROVIDER_SITE_OTHER): Payer: 59

## 2015-09-10 ENCOUNTER — Encounter: Payer: Self-pay | Admitting: Internal Medicine

## 2015-09-10 VITALS — BP 118/76 | HR 80 | Temp 97.8°F | Wt 271.0 lb

## 2015-09-10 DIAGNOSIS — E785 Hyperlipidemia, unspecified: Secondary | ICD-10-CM

## 2015-09-10 DIAGNOSIS — I10 Essential (primary) hypertension: Secondary | ICD-10-CM | POA: Diagnosis not present

## 2015-09-10 DIAGNOSIS — R430 Anosmia: Secondary | ICD-10-CM | POA: Diagnosis not present

## 2015-09-10 DIAGNOSIS — K219 Gastro-esophageal reflux disease without esophagitis: Secondary | ICD-10-CM

## 2015-09-10 LAB — HEPATIC FUNCTION PANEL
ALBUMIN: 4.2 g/dL (ref 3.5–5.2)
ALK PHOS: 44 U/L (ref 39–117)
ALT: 53 U/L (ref 0–53)
AST: 43 U/L — AB (ref 0–37)
Bilirubin, Direct: 0.1 mg/dL (ref 0.0–0.3)
Total Bilirubin: 0.6 mg/dL (ref 0.2–1.2)
Total Protein: 7.4 g/dL (ref 6.0–8.3)

## 2015-09-10 LAB — LIPID PANEL
Cholesterol: 162 mg/dL (ref 0–200)
HDL: 39.4 mg/dL (ref >39.00)
LDL Cholesterol: 100 mg/dL — ABNORMAL HIGH (ref 0–99)
NONHDL: 122.99
Total CHOL/HDL Ratio: 4
Triglycerides: 114 mg/dL (ref 0.0–149.0)
VLDL: 22.8 mg/dL (ref 0.0–40.0)

## 2015-09-10 LAB — BASIC METABOLIC PANEL
BUN: 14 mg/dL (ref 6–23)
CHLORIDE: 101 meq/L (ref 96–112)
CO2: 28 mEq/L (ref 19–32)
Calcium: 10 mg/dL (ref 8.4–10.5)
Creatinine, Ser: 0.99 mg/dL (ref 0.40–1.50)
GFR: 81.26 mL/min (ref >60.00)
Glucose, Bld: 117 mg/dL — ABNORMAL HIGH (ref 70–99)
POTASSIUM: 4.7 meq/L (ref 3.5–5.1)
Sodium: 137 mEq/L (ref 135–145)

## 2015-09-10 LAB — HEMOGLOBIN A1C: HEMOGLOBIN A1C: 6.2 % (ref 4.6–6.5)

## 2015-09-10 MED ORDER — ROSUVASTATIN CALCIUM 40 MG PO TABS: 40.0000 mg | ORAL_TABLET | Freq: Every day | ORAL | Status: DC

## 2015-09-10 MED ORDER — FLUTICASONE PROPIONATE 50 MCG/ACT NA SUSP: 2.0000 | Freq: Every day | NASAL | Status: DC

## 2015-09-10 MED ORDER — TRIAMCINOLONE ACETONIDE 0.5 % EX CREA: TOPICAL_CREAM | Freq: Two times a day (BID) | CUTANEOUS | Status: DC

## 2015-09-10 MED ORDER — LORATADINE 10 MG PO TABS: 10.0000 mg | ORAL_TABLET | Freq: Every day | ORAL | Status: DC

## 2015-09-10 NOTE — Progress Notes (Signed)
Pre visit review using our clinic review tool, if applicable. No additional management support is needed unless otherwise documented below in the visit note. 

## 2015-09-10 NOTE — Assessment & Plan Note (Signed)
Protonix.  ?

## 2015-09-10 NOTE — Assessment & Plan Note (Signed)
Labs Crestor 

## 2015-09-10 NOTE — Assessment & Plan Note (Signed)
ENT ref offered 

## 2015-09-10 NOTE — Progress Notes (Signed)
Subjective:  Patient ID: Zachary Valdez, male    DOB: 1951-06-26  Age: 64 y.o. MRN: [id number]  CC: No chief complaint on file.   HPI ARDI ALLING presents for dyslipidemia, OA, GERD f/u  Outpatient Prescriptions Prior to Visit  Medication Sig Dispense Refill  . cholecalciferol (VITAMIN D) 1000 UNITS tablet Take 1 tablet (1,000 Units total) by mouth daily. 100 tablet 3  . CRESTOR 40 MG tablet TAKE 1 TABLET BY MOUTH DAILY 30 tablet 10  . diclofenac (VOLTAREN) 50 MG EC tablet Take 1 tablet (50 mg total) by mouth 2 (two) times daily as needed. 60 tablet 3  . EDARBI 80 MG TABS TAKE 1 TABLET BY MOUTH DAILY 30 tablet 11  . fluticasone (FLONASE) 50 MCG/ACT nasal spray Place 2 sprays into both nostrils daily. 16 g 11  . LEVITRA 20 MG tablet TAKE 1 TABLET(S) BY MOUTH DAILY AS NEEDED 10 tablet 10  . loratadine (CLARITIN) 10 MG tablet Take 1 tablet (10 mg total) by mouth daily. 100 tablet 3  . pantoprazole (PROTONIX) 40 MG tablet Take 1 tablet (40 mg total) by mouth daily. 30 tablet 11  . pantoprazole (PROTONIX) 40 MG tablet Take 1 tablet (40 mg total) by mouth daily. 30 tablet 11  . triamcinolone cream (KENALOG) 0.5 % Apply topically 3 (three) times daily. 60 g 5  . vitamin B-12 (CYANOCOBALAMIN) 1000 MCG tablet Take 1 tablet (1,000 mcg total) by mouth daily. 100 tablet 3  . VOLTAREN 1 % GEL APPLY 4 GRAMS TOPICALLY FOUR TIMES DAILY. 200 g 2   No facility-administered medications prior to visit.    ROS Review of Systems  Constitutional: Negative for appetite change, fatigue and unexpected weight change.  HENT: Negative for congestion, nosebleeds, sneezing, sore throat and trouble swallowing.   Eyes: Negative for itching and visual disturbance.  Respiratory: Negative for cough.   Cardiovascular: Negative for chest pain, palpitations and leg swelling.  Gastrointestinal: Negative for nausea, diarrhea, blood in stool and abdominal distention.  Genitourinary: Negative for frequency and hematuria.    Musculoskeletal: Negative for back pain, joint swelling, gait problem and neck pain.  Skin: Negative for rash.  Neurological: Negative for dizziness, tremors, speech difficulty and weakness.  Psychiatric/Behavioral: Negative for sleep disturbance, dysphoric mood and agitation. The patient is not nervous/anxious.     Objective:  BP 118/76 mmHg  Pulse 80  Temp(Src) 97.8 F (36.6 C)  Wt 271 lb (122.925 kg)  SpO2 98%  BP Readings from Last 3 Encounters:  09/10/15 118/76  03/12/15 130/82  01/15/15 130/78    Wt Readings from Last 3 Encounters:  09/10/15 271 lb (122.925 kg)  03/12/15 262 lb (118.842 kg)  01/15/15 258 lb (117.028 kg)    Physical Exam  Constitutional: He is oriented to person, place, and time. He appears well-developed. No distress.  NAD  HENT:  Mouth/Throat: Oropharynx is clear and moist.  Eyes: Conjunctivae are normal. Pupils are equal, round, and reactive to light.  Neck: Normal range of motion. No JVD present. No thyromegaly present.  Cardiovascular: Normal rate, regular rhythm, normal heart sounds and intact distal pulses.  Exam reveals no gallop and no friction rub.   No murmur heard. Pulmonary/Chest: Effort normal and breath sounds normal. No respiratory distress. He has no wheezes. He has no rales. He exhibits no tenderness.  Abdominal: Soft. Bowel sounds are normal. He exhibits no distension and no mass. There is no tenderness. There is no rebound and no guarding.  Musculoskeletal: Normal range  of motion. He exhibits no edema or tenderness.  Lymphadenopathy:    He has no cervical adenopathy.  Neurological: He is alert and oriented to person, place, and time. He has normal reflexes. No cranial nerve deficit. He exhibits normal muscle tone. He displays a negative Romberg sign. Coordination and gait normal.  Skin: Skin is warm and dry. No rash noted.  Psychiatric: He has a normal mood and affect. His behavior is normal. Judgment and thought content normal.     Lab Results  Component Value Date   WBC 5.9 03/10/2015   HGB 14.6 03/10/2015   HCT 43.8 03/10/2015   PLT 189.0 03/10/2015   GLUCOSE 116* 03/10/2015   CHOL 175 03/10/2015   TRIG 147.0 03/10/2015   HDL 39.60 03/10/2015   LDLDIRECT 146.2 10/07/2010   LDLCALC 106* 03/10/2015   ALT 49 03/10/2015   AST 36 03/10/2015   NA 138 03/10/2015   K 4.2 03/10/2015   CL 105 03/10/2015   CREATININE 1.00 03/10/2015   BUN 24* 03/10/2015   CO2 27 03/10/2015   TSH 3.31 03/10/2015   PSA 0.40 03/10/2015   INR 1.02 08/31/2014   HGBA1C 5.6 03/10/2015    Dg Chest 2 View  08/31/2014  CLINICAL DATA:  Preop for left knee surgery. EXAM: CHEST  2 VIEW COMPARISON:  None. FINDINGS: The heart size and mediastinal contours are within normal limits. Both lungs are clear. The visualized skeletal structures are unremarkable. IMPRESSION: Normal chest x-ray. Electronically Signed   By: Kalman Jewels M.D.   On: 08/31/2014 15:02    Assessment & Plan:   There are no diagnoses linked to this encounter. I am having Mr. Mangas maintain his CRESTOR, LEVITRA, fluticasone, loratadine, triamcinolone cream, pantoprazole, vitamin B-12, cholecalciferol, pantoprazole, EDARBI, diclofenac, and VOLTAREN.  No orders of the defined types were placed in this encounter.     Follow-up: No Follow-up on file.  Walker Kehr, MD

## 2015-09-10 NOTE — Assessment & Plan Note (Signed)
On Edarbi 

## 2015-12-02 ENCOUNTER — Ambulatory Visit: Payer: 59 | Admitting: Internal Medicine

## 2015-12-06 ENCOUNTER — Other Ambulatory Visit (INDEPENDENT_AMBULATORY_CARE_PROVIDER_SITE_OTHER): Payer: Commercial Managed Care - HMO

## 2015-12-06 ENCOUNTER — Ambulatory Visit (INDEPENDENT_AMBULATORY_CARE_PROVIDER_SITE_OTHER): Payer: Commercial Managed Care - HMO | Admitting: Family

## 2015-12-06 ENCOUNTER — Encounter: Payer: Self-pay | Admitting: Family

## 2015-12-06 VITALS — BP 128/82 | HR 93 | Temp 97.9°F | Resp 16 | Ht 72.0 in | Wt 280.0 lb

## 2015-12-06 DIAGNOSIS — R200 Anesthesia of skin: Secondary | ICD-10-CM

## 2015-12-06 DIAGNOSIS — R202 Paresthesia of skin: Secondary | ICD-10-CM

## 2015-12-06 LAB — CBC
HEMATOCRIT: 43.2 % (ref 39.0–52.0)
HEMOGLOBIN: 14.6 g/dL (ref 13.0–17.0)
MCHC: 33.8 g/dL (ref 30.0–36.0)
MCV: 98.7 fl (ref 78.0–100.0)
PLATELETS: 169 10*3/uL (ref 150.0–400.0)
RBC: 4.38 Mil/uL (ref 4.22–5.81)
RDW: 13.3 % (ref 11.5–15.5)
WBC: 7.6 10*3/uL (ref 4.0–10.5)

## 2015-12-06 LAB — HEMOGLOBIN A1C: Hgb A1c MFr Bld: 6.4 % (ref 4.6–6.5)

## 2015-12-06 NOTE — Progress Notes (Signed)
Subjective:    Patient ID: Zachary Valdez, male    DOB: 1951/01/28, 65 y.o.   MRN: [id number]  Chief Complaint  Patient presents with  . Knee Pain    had a knee replacement about a year ago, was told by an orthopedic dr that he could have a partial blockage in one of his arteries    HPI:  Zachary Valdez is a 65 y.o. male who  has a past medical history of Hyperlipidemia; Hypertension; OA (osteoarthritis); Vitamin B 12 deficiency; Allergic rhinitis; GERD (gastroesophageal reflux disease); Ulcerative esophagitis (2006); Hyperplastic colon polyp (2006); Obesity; Sleep apnea; and Anxiety. and presents today for an office visit.   1.)  Lower leg numbness - Recent total knee replacement of his left knee in December 2015. Began experiencing the associated symptom of numbness located in his left lower leg that has been going on for about 6 months. Aggravated by standing on it for long periods of time and walking while improved by sitting. Seen by orthopedics and there was concern for "cholesterol build up" that was seen on x-ray. Has also noted some occasional numbness and tingling in his other extremities. Does have occasional calf cramping bilaterally.   Allergies  Allergen Reactions  . Atorvastatin     REACTION: achy  . Enalapril     cough  . Hydrocodone-Acetaminophen     REACTION: SOB, patient states drug made patient feel weird   . Tramadol     headache     Current Outpatient Prescriptions on File Prior to Visit  Medication Sig Dispense Refill  . cholecalciferol (VITAMIN D) 1000 UNITS tablet Take 1 tablet (1,000 Units total) by mouth daily. 100 tablet 3  . diclofenac (VOLTAREN) 50 MG EC tablet Take 1 tablet (50 mg total) by mouth 2 (two) times daily as needed. 60 tablet 3  . EDARBI 80 MG TABS TAKE 1 TABLET BY MOUTH DAILY 30 tablet 11  . fluticasone (FLONASE) 50 MCG/ACT nasal spray Place 2 sprays into both nostrils daily. 48 g 3  . LEVITRA 20 MG tablet TAKE 1 TABLET(S) BY MOUTH DAILY AS  NEEDED 10 tablet 10  . loratadine (CLARITIN) 10 MG tablet Take 1 tablet (10 mg total) by mouth daily. 100 tablet 3  . pantoprazole (PROTONIX) 40 MG tablet Take 1 tablet (40 mg total) by mouth daily. 30 tablet 11  . rosuvastatin (CRESTOR) 40 MG tablet Take 1 tablet (40 mg total) by mouth daily. 90 tablet 3  . triamcinolone cream (KENALOG) 0.5 % Apply topically 2 (two) times daily. 60 g 3  . vitamin B-12 (CYANOCOBALAMIN) 1000 MCG tablet Take 1 tablet (1,000 mcg total) by mouth daily. 100 tablet 3  . VOLTAREN 1 % GEL APPLY 4 GRAMS TOPICALLY FOUR TIMES DAILY. 200 g 2   No current facility-administered medications on file prior to visit.    Review of Systems  Constitutional: Negative for fever and chills.  Neurological: Positive for numbness.      Objective:    BP 128/82 mmHg  Pulse 93  Temp(Src) 97.9 F (36.6 C) (Oral)  Resp 16  Ht 6' (1.829 m)  Wt 280 lb (127.007 kg)  BMI 37.97 kg/m2  SpO2 97% Nursing note and vital signs reviewed.  Physical Exam  Constitutional: He is oriented to person, place, and time. He appears well-developed and well-nourished. No distress.  Cardiovascular: Normal rate, regular rhythm, normal heart sounds and intact distal pulses.   Pulmonary/Chest: Effort normal and breath sounds normal.  Musculoskeletal:  Left knee - no obvious deformity, discoloration, or edema. Mild tenderness elicited located anteriorly around patellar tendon and superiorly her quadriceps extensor muscle group. Note numbness or tingling able to be elicited. Range of motion is within normal limits. Strength is 4-5+. Distal pulses and sensation are intact and appropriate. Ligamentous and meniscal testing are negative.  Neurological: He is alert and oriented to person, place, and time.  Skin: Skin is warm and dry.  Psychiatric: He has a normal mood and affect. His behavior is normal. Judgment and thought content normal.       Assessment & Plan:   Problem List Items Addressed This Visit       Other   Numbness and tingling of left leg - Primary    Numbness and tingling with concern for metabolic versus orthopedic origin. Obtain CBC, hemoglobin A1c, and methylmalonic acid. Does have history of B12 deficiency as well as prediabetes. Cannot rule out lumbar spine pathology although asymptomatic at this time. Discussed at length the potential for vascular origins with potential for lower extremity venous Dopplers or ankle brachial index. Wishes to pursue blood work at this time and additional tests pending blood work.       Relevant Orders   Methylmalonic acid, serum   Hemoglobin A1c   CBC

## 2015-12-06 NOTE — Patient Instructions (Addendum)
Thank you for choosing [facility].  Summary/Instructions:  Your prescription(s) have been submitted to your pharmacy or been printed and provided for you. Please take as directed and contact our office if you believe you are having problem(s) with the medication(s) or have any questions.  If your symptoms worsen or fail to improve, please contact our office for further instruction, or in case of emergency go directly to the emergency room at the closest medical facility.    Cyanocobalamin, Vitamin B12 injection What is this medicine? CYANOCOBALAMIN (sye an oh koe BAL a min) is a man made form of vitamin B12. Vitamin B12 is used in the growth of healthy blood cells, nerve cells, and proteins in the body. It also helps with the metabolism of fats and carbohydrates. This medicine is used to treat people who can not absorb vitamin B12. This medicine may be used for other purposes; ask your health care provider or pharmacist if you have questions. What should I tell my health care provider before I take this medicine? They need to know if you have any of these conditions: -kidney disease -Leber's disease -megaloblastic anemia -an unusual or allergic reaction to cyanocobalamin, cobalt, other medicines, foods, dyes, or preservatives -pregnant or trying to get pregnant -breast-feeding How should I use this medicine? This medicine is injected into a muscle or deeply under the skin. It is usually given by a health care professional in a clinic or doctor's office. However, your doctor may teach you how to inject yourself. Follow all instructions. Talk to your pediatrician regarding the use of this medicine in children. Special care may be needed. Overdosage: If you think you have taken too much of this medicine contact a poison control center or emergency room at once. NOTE: This medicine is only for you. Do not share this medicine with others. What if I miss a dose? If you are given your  dose at a clinic or doctor's office, call to reschedule your appointment. If you give your own injections and you miss a dose, take it as soon as you can. If it is almost time for your next dose, take only that dose. Do not take double or extra doses. What may interact with this medicine? -colchicine -heavy alcohol intake This list may not describe all possible interactions. Give your health care provider a list of all the medicines, herbs, non-prescription drugs, or dietary supplements you use. Also tell them if you smoke, drink alcohol, or use illegal drugs. Some items may interact with your medicine. What should I watch for while using this medicine? Visit your doctor or health care professional regularly. You may need blood work done while you are taking this medicine. You may need to follow a special diet. Talk to your doctor. Limit your alcohol intake and avoid smoking to get the best benefit. What side effects may I notice from receiving this medicine? Side effects that you should report to your doctor or health care professional as soon as possible: -allergic reactions like skin rash, itching or hives, swelling of the face, lips, or tongue -blue tint to skin -chest tightness, pain -difficulty breathing, wheezing -dizziness -red, swollen painful area on the leg Side effects that usually do not require medical attention (report to your doctor or health care professional if they continue or are bothersome): -diarrhea -headache This list may not describe all possible side effects. Call your doctor for medical advice about side effects. You may report side effects to FDA at 1-800-FDA-1088. Where should  I keep my medicine? Keep out of the reach of children. Store at room temperature between 15 and 30 degrees C (59 and 85 degrees F). Protect from light. Throw away any unused medicine after the expiration date. NOTE: This sheet is a summary. It may not cover all possible information. If you  have questions about this medicine, talk to your doctor, pharmacist, or health care provider.    2016, Elsevier/Gold Standard. (2007-12-30 22:10:20)  Vitamin B12 Deficiency Not having enough vitamin B12 is called a deficiency. Vitamin B12 is an important vitamin. Your body needs vitamin B12 to:   Make red blood cells.  Make DNA. This is the genetic material inside all of your cells.  Help your nerves work properly so they can carry messages from your brain to your body. CAUSES  Not eating enough foods that contain vitamin B12.  Not having enough stomach acid and digestive juices. The body needs these to absorb vitamin B12 from the food you eat.  Having certain digestive system diseases that make it hard to absorb vitamin B12. These diseases include Crohn's disease, chronic pancreatitis, and cystic fibrosis.  Having pernicious anemia, which is a condition where the body has too few red blood cells. People with this condition do not make enough of a protein called "intrinsic factor," which is needed to absorb vitamin B12.  Having a surgery in which part of the stomach or small intestine is removed.  Taking certain medicines that make it hard for the body to absorb vitamin B12. These medicines include:  Heartburn medicine (antacids and proton pump inhibitors).  A certain antibiotic medicine called neomycin, which fights infection.  Some medicines used to treat diabetes, tuberculosis, gout, and high cholesterol. RISK FACTORS Risk factors are things that make you more likely to develop a vitamin B12 deficiency. They include:  Being older than 19.  Being a vegetarian.  Being pregnant and a vegetarian or having a poor diet.  Taking certain drugs.  Being an alcoholic. SYMPTOMS You may have a vitamin B12 deficiency with no symptoms. However, a vitamin B12 deficiency can cause health problems like anemia and nerve damage. These health problems can lead to many possible symptoms,  including:  Weakness.  Fatigue.  Loss of appetite.  Weight loss.  Numbness or tingling in your hands and feet.  Redness and burning of the tongue.  Confusion or memory problems.  Depression.  Dizziness.  Sensory problems, such as loss of taste, color blindness, and ringing in the ears.  Diarrhea or constipation.  Trouble walking. DIAGNOSIS Various types of tests can be given to help find the cause of your vitamin B12 deficiency. These tests include:  A complete blood count (CBC). This test gives your caregiver an overall picture of what makes up your blood.  A blood test to measure your B12 level.  A blood test to measure intrinsic factor.  An endoscopy. This procedure uses a thin tube with a camera on the end to look into your stomach or intestines. TREATMENT Treatment for vitamin B12 deficiency depends on what is causing it. Common options include:  Changing your eating and drinking habits, such as:  Eating more foods that contain vitamin B12.  Not drinking as much alcohol or any alcohol.  Taking vitamin B12 supplements. Your caregiver will tell you what dose is best for you.  Getting vitamin B12 injections. Some people get these a few times a week. Others get them once a month. HOME CARE INSTRUCTIONS  Take all supplements as  directed by your caregiver. Follow the directions carefully.  Get any injections your caregiver prescribes. Do not miss your appointments.  Eat lots of healthy foods that contain vitamin B12. Ask your caregiver if you should work with a nutritionist. Good things to include in your diet are:  Meat.  Poultry.  Fish.  Eggs.  Fortified cereal and dairy products. This means vitamin B12 has been added to the food. Check the label on the package to be sure.  Do not abuse alcohol.  Keep all follow-up appointments. Your caregiver will need to perform blood tests to make sure your vitamin B12 deficiency is going away. SEEK MEDICAL CARE  IF:  You have any questions about your treatment.  Your symptoms come back. MAKE SURE YOU:  Understand these instructions.  Will watch your condition.  Will get help right away if you are not doing well or get worse.   This information is not intended to replace advice given to you by your health care provider. Make sure you discuss any questions you have with your health care provider.   Document Released: 12/11/2011 Document Reviewed: 02/03/2015 Elsevier Interactive Patient Education [employer].

## 2015-12-06 NOTE — Assessment & Plan Note (Signed)
Numbness and tingling with concern for metabolic versus orthopedic origin. Obtain CBC, hemoglobin A1c, and methylmalonic acid. Does have history of B12 deficiency as well as prediabetes. Cannot rule out lumbar spine pathology although asymptomatic at this time. Discussed at length the potential for vascular origins with potential for lower extremity venous Dopplers or ankle brachial index. Wishes to pursue blood work at this time and additional tests pending blood work.

## 2015-12-06 NOTE — Progress Notes (Signed)
Pre visit review using our clinic review tool, if applicable. No additional management support is needed unless otherwise documented below in the visit note. 

## 2015-12-07 ENCOUNTER — Encounter: Payer: Self-pay | Admitting: Family

## 2015-12-09 ENCOUNTER — Encounter: Payer: Self-pay | Admitting: Family

## 2015-12-09 LAB — METHYLMALONIC ACID, SERUM: Methylmalonic Acid, Quant: 113 nmol/L (ref 87–318)

## 2016-01-20 ENCOUNTER — Ambulatory Visit (INDEPENDENT_AMBULATORY_CARE_PROVIDER_SITE_OTHER): Payer: Commercial Managed Care - HMO | Admitting: Internal Medicine

## 2016-01-20 ENCOUNTER — Encounter: Payer: Self-pay | Admitting: Internal Medicine

## 2016-01-20 VITALS — BP 120/80 | HR 99 | Temp 97.9°F | Wt 277.0 lb

## 2016-01-20 DIAGNOSIS — Z0000 Encounter for general adult medical examination without abnormal findings: Secondary | ICD-10-CM

## 2016-01-20 DIAGNOSIS — R739 Hyperglycemia, unspecified: Secondary | ICD-10-CM

## 2016-01-20 DIAGNOSIS — R21 Rash and other nonspecific skin eruption: Secondary | ICD-10-CM

## 2016-01-20 DIAGNOSIS — R7309 Other abnormal glucose: Secondary | ICD-10-CM

## 2016-01-20 MED ORDER — PREDNISONE 10 MG PO TABS: ORAL_TABLET | ORAL | Status: DC

## 2016-01-20 MED ORDER — HALOBETASOL PROPIONATE 0.05 % EX CREA: TOPICAL_CREAM | Freq: Two times a day (BID) | CUTANEOUS | Status: DC

## 2016-01-20 MED ORDER — DOXYCYCLINE HYCLATE 100 MG PO TABS
100.0000 mg | ORAL_TABLET | Freq: Two times a day (BID) | ORAL | Status: DC
Start: 2016-01-20 — End: 2016-03-10

## 2016-01-20 NOTE — Patient Instructions (Signed)
complicated flea bites -   Benadryl at night

## 2016-01-20 NOTE — Progress Notes (Signed)
Pre visit review using our clinic review tool, if applicable. No additional management support is needed unless otherwise documented below in the visit note. 

## 2016-01-20 NOTE — Assessment & Plan Note (Addendum)
4/17 3-4 months, complicated flea bites -  Doxy x 1 mo Prednisone 10 mg: take 4 tabs a day x 3 days; then 3 tabs a day x 4 days; then 2 tabs a day x 4 days, then 1 tab a day x 6 days, then stop. Take pc.  Potential benefits of  steroids  use as well as potential risks  and complications were explained to the patient and were aknowledged.   Ultravate Benadryl at night RTC 6 weeks Elevate legs

## 2016-01-20 NOTE — Progress Notes (Signed)
Subjective:  Patient ID: Zachary Valdez, male    DOB: Apr 09, 1951  Age: 65 y.o. MRN: [id number]  CC: No chief complaint on file.   HPI Zachary Valdez presents for a rash on B shins x 3-4 mo. Rash comes in crops, very itchy. 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 He has 2 dogs.  Outpatient Prescriptions Prior to Visit  Medication Sig Dispense Refill  . cholecalciferol (VITAMIN D) 1000 UNITS tablet Take 1 tablet (1,000 Units total) by mouth daily. 100 tablet 3  . diclofenac (VOLTAREN) 50 MG EC tablet Take 1 tablet (50 mg total) by mouth 2 (two) times daily as needed. 60 tablet 3  . EDARBI 80 MG TABS TAKE 1 TABLET BY MOUTH DAILY 30 tablet 11  . fluticasone (FLONASE) 50 MCG/ACT nasal spray Place 2 sprays into both nostrils daily. 48 g 3  . LEVITRA 20 MG tablet TAKE 1 TABLET(S) BY MOUTH DAILY AS NEEDED 10 tablet 10  . loratadine (CLARITIN) 10 MG tablet Take 1 tablet (10 mg total) by mouth daily. 100 tablet 3  . pantoprazole (PROTONIX) 40 MG tablet Take 1 tablet (40 mg total) by mouth daily. 30 tablet 11  . rosuvastatin (CRESTOR) 40 MG tablet Take 1 tablet (40 mg total) by mouth daily. 90 tablet 3  . vitamin B-12 (CYANOCOBALAMIN) 1000 MCG tablet Take 1 tablet (1,000 mcg total) by mouth daily. 100 tablet 3  . VOLTAREN 1 % GEL APPLY 4 GRAMS TOPICALLY FOUR TIMES DAILY. 200 g 2  . triamcinolone cream (KENALOG) 0.5 % Apply topically 2 (two) times daily. 60 g 3   No facility-administered medications prior to visit.    ROS Review of Systems  Constitutional: Negative for fatigue.  Gastrointestinal: Negative for diarrhea.  Musculoskeletal: Negative for arthralgias.  Skin: Positive for color change and rash.  Neurological: Negative for weakness.    Objective:  BP 120/80 mmHg  Pulse 99  Temp(Src) 97.9 F (36.6 C) (Oral)  Wt 277 lb (125.646 kg)  SpO2 97%  BP Readings from Last 3 Encounters:  01/20/16 120/80  12/06/15 128/82  09/10/15 118/76    Wt Readings from Last 3 Encounters:    01/20/16 277 lb (125.646 kg)  12/06/15 280 lb (127.007 kg)  09/10/15 271 lb (122.925 kg)    Physical Exam  Constitutional: He is oriented to person, place, and time. He appears well-developed. No distress.  NAD  HENT:  Mouth/Throat: Oropharynx is clear and moist.  Eyes: Conjunctivae are normal. Pupils are equal, round, and reactive to light.  Neck: Normal range of motion. No JVD present. No thyromegaly present.  Cardiovascular: Normal rate, regular rhythm, normal heart sounds and intact distal pulses.  Exam reveals no gallop and no friction rub.   No murmur heard. Pulmonary/Chest: Effort normal and breath sounds normal. No respiratory distress. He has no wheezes. He has no rales. He exhibits no tenderness.  Abdominal: Soft. Bowel sounds are normal. He exhibits no distension and no mass. There is no tenderness. There is no rebound and no guarding.  Musculoskeletal: Normal range of motion. He exhibits no edema or tenderness.  Lymphadenopathy:    He has no cervical adenopathy.  Neurological: He is alert and oriented to person, place, and time. He has normal reflexes. No cranial nerve deficit. He exhibits normal muscle tone. He displays a negative Romberg sign. Coordination and gait normal.  Skin: Skin is warm and dry. Rash noted.  Psychiatric: He has a normal mood and affect. His behavior is  normal. Judgment and thought content normal.  Obese LEs wno-to-trace edema Shins with multiple dot-like scabbed papules and pink papules 0.5-2.0 cm in size in groups  Lab Results  Component Value Date   WBC 7.6 12/06/2015   HGB 14.6 12/06/2015   HCT 43.2 12/06/2015   PLT 169.0 12/06/2015   GLUCOSE 117* 09/10/2015   CHOL 162 09/10/2015   TRIG 114.0 09/10/2015   HDL 39.40 09/10/2015   LDLDIRECT 146.2 10/07/2010   LDLCALC 100* 09/10/2015   ALT 53 09/10/2015   AST 43* 09/10/2015   NA 137 09/10/2015   K 4.7 09/10/2015   CL 101 09/10/2015   CREATININE 0.99 09/10/2015   BUN 14 09/10/2015    CO2 28 09/10/2015   TSH 3.31 03/10/2015   PSA 0.40 03/10/2015   INR 1.02 08/31/2014   HGBA1C 6.4 12/06/2015    Dg Chest 2 View  08/31/2014  CLINICAL DATA:  Preop for left knee surgery. EXAM: CHEST  2 VIEW COMPARISON:  None. FINDINGS: The heart size and mediastinal contours are within normal limits. Both lungs are clear. The visualized skeletal structures are unremarkable. IMPRESSION: Normal chest x-ray. Electronically Signed   By: Kalman Jewels M.D.   On: 08/31/2014 15:02    Assessment & Plan:   Diagnoses and all orders for this visit:  Rash and nonspecific skin eruption  Other orders -     doxycycline (VIBRA-TABS) 100 MG tablet; Take 1 tablet (100 mg total) by mouth 2 (two) times daily. -     predniSONE (DELTASONE) 10 MG tablet; Prednisone 10 mg: take 4 tabs a day x 3 days; then 3 tabs a day x 4 days; then 2 tabs a day x 4 days, then 1 tab a day x 6 days, then stop. Take pc. -     halobetasol (ULTRAVATE) 0.05 % cream; Apply topically 2 (two) times daily.   I have discontinued Mr. Milbert triamcinolone cream. I am also having him start on doxycycline, predniSONE, and halobetasol. Additionally, I am having him maintain his LEVITRA, vitamin B-12, cholecalciferol, pantoprazole, EDARBI, diclofenac, VOLTAREN, fluticasone, loratadine, and rosuvastatin.  Meds ordered this encounter  Medications  . doxycycline (VIBRA-TABS) 100 MG tablet    Sig: Take 1 tablet (100 mg total) by mouth 2 (two) times daily.    Dispense:  60 tablet    Refill:  1  . predniSONE (DELTASONE) 10 MG tablet    Sig: Prednisone 10 mg: take 4 tabs a day x 3 days; then 3 tabs a day x 4 days; then 2 tabs a day x 4 days, then 1 tab a day x 6 days, then stop. Take pc.    Dispense:  38 tablet    Refill:  1  . halobetasol (ULTRAVATE) 0.05 % cream    Sig: Apply topically 2 (two) times daily.    Dispense:  100 g    Refill:  2     Follow-up: No Follow-up on file.  Walker Kehr, MD

## 2016-01-20 NOTE — Assessment & Plan Note (Signed)
No polyuria/polydypsia Wt loss Labs

## 2016-02-23 ENCOUNTER — Encounter: Payer: Self-pay | Admitting: Internal Medicine

## 2016-03-10 ENCOUNTER — Encounter: Payer: Self-pay | Admitting: Internal Medicine

## 2016-03-10 ENCOUNTER — Other Ambulatory Visit (INDEPENDENT_AMBULATORY_CARE_PROVIDER_SITE_OTHER): Payer: Commercial Managed Care - HMO

## 2016-03-10 ENCOUNTER — Ambulatory Visit (INDEPENDENT_AMBULATORY_CARE_PROVIDER_SITE_OTHER): Payer: Commercial Managed Care - HMO | Admitting: Internal Medicine

## 2016-03-10 ENCOUNTER — Ambulatory Visit (INDEPENDENT_AMBULATORY_CARE_PROVIDER_SITE_OTHER)
Admission: RE | Admit: 2016-03-10 | Discharge: 2016-03-10 | Disposition: A | Discharge status: Home / Self Care | Payer: Commercial Managed Care - HMO | Source: Ambulatory Visit | Attending: Internal Medicine | Admitting: Internal Medicine

## 2016-03-10 VITALS — BP 118/76 | HR 88 | Ht 72.0 in | Wt 270.0 lb

## 2016-03-10 DIAGNOSIS — M79672 Pain in left foot: Secondary | ICD-10-CM | POA: Diagnosis not present

## 2016-03-10 DIAGNOSIS — R21 Rash and other nonspecific skin eruption: Secondary | ICD-10-CM | POA: Diagnosis not present

## 2016-03-10 DIAGNOSIS — Z0000 Encounter for general adult medical examination without abnormal findings: Secondary | ICD-10-CM | POA: Diagnosis not present

## 2016-03-10 DIAGNOSIS — Z1211 Encounter for screening for malignant neoplasm of colon: Secondary | ICD-10-CM

## 2016-03-10 DIAGNOSIS — R739 Hyperglycemia, unspecified: Secondary | ICD-10-CM

## 2016-03-10 LAB — CBC WITH DIFFERENTIAL/PLATELET
BASOS ABS: 0 10*3/uL (ref 0.0–0.1)
Basophils Relative: 0.5 % (ref 0.0–3.0)
Eosinophils Absolute: 0.3 10*3/uL (ref 0.0–0.7)
Eosinophils Relative: 3.8 % (ref 0.0–5.0)
HEMATOCRIT: 46 % (ref 39.0–52.0)
Hemoglobin: 15.6 g/dL (ref 13.0–17.0)
LYMPHS PCT: 19.6 % (ref 12.0–46.0)
Lymphs Abs: 1.7 10*3/uL (ref 0.7–4.0)
MCHC: 33.8 g/dL (ref 30.0–36.0)
MCV: 102.2 fl — AB (ref 78.0–100.0)
MONOS PCT: 11.8 % (ref 3.0–12.0)
Monocytes Absolute: 1 10*3/uL (ref 0.1–1.0)
NEUTROS ABS: 5.5 10*3/uL (ref 1.4–7.7)
Neutrophils Relative %: 64.3 % (ref 43.0–77.0)
PLATELETS: 185 10*3/uL (ref 150.0–400.0)
RBC: 4.5 Mil/uL (ref 4.22–5.81)
RDW: 13.5 % (ref 11.5–15.5)
WBC: 8.5 10*3/uL (ref 4.0–10.5)

## 2016-03-10 LAB — URINALYSIS
Bilirubin Urine: NEGATIVE
Hgb urine dipstick: NEGATIVE
KETONES UR: NEGATIVE
LEUKOCYTES UA: NEGATIVE
NITRITE: NEGATIVE
PH: 6 (ref 5.0–8.0)
SPECIFIC GRAVITY, URINE: 1.02 (ref 1.000–1.030)
Total Protein, Urine: NEGATIVE
UROBILINOGEN UA: 0.2 (ref 0.0–1.0)
Urine Glucose: NEGATIVE

## 2016-03-10 LAB — BASIC METABOLIC PANEL
BUN: 23 mg/dL (ref 6–23)
CALCIUM: 10.1 mg/dL (ref 8.4–10.5)
CHLORIDE: 102 meq/L (ref 96–112)
CO2: 27 meq/L (ref 19–32)
Creatinine, Ser: 1.14 mg/dL (ref 0.40–1.50)
GFR: 68.94 mL/min (ref >60.00)
Glucose, Bld: 126 mg/dL — ABNORMAL HIGH (ref 70–99)
POTASSIUM: 4.5 meq/L (ref 3.5–5.1)
SODIUM: 138 meq/L (ref 135–145)

## 2016-03-10 LAB — HEPATIC FUNCTION PANEL
ALBUMIN: 4.4 g/dL (ref 3.5–5.2)
ALT: 56 U/L — ABNORMAL HIGH (ref 0–53)
AST: 46 U/L — ABNORMAL HIGH (ref 0–37)
Alkaline Phosphatase: 44 U/L (ref 39–117)
BILIRUBIN TOTAL: 0.8 mg/dL (ref 0.2–1.2)
Bilirubin, Direct: 0.2 mg/dL (ref 0.0–0.3)
Total Protein: 7.4 g/dL (ref 6.0–8.3)

## 2016-03-10 LAB — LIPID PANEL
CHOLESTEROL: 181 mg/dL (ref 0–200)
HDL: 44.4 mg/dL (ref >39.00)
LDL CALC: 115 mg/dL — AB (ref 0–99)
NonHDL: 136.55
TRIGLYCERIDES: 110 mg/dL (ref 0.0–149.0)
Total CHOL/HDL Ratio: 4
VLDL: 22 mg/dL (ref 0.0–40.0)

## 2016-03-10 LAB — TSH: TSH: 4.03 u[IU]/mL (ref 0.35–4.50)

## 2016-03-10 LAB — HEMOGLOBIN A1C: Hgb A1c MFr Bld: 6 % (ref 4.6–6.5)

## 2016-03-10 LAB — PSA: PSA: 0.42 ng/mL (ref 0.10–4.00)

## 2016-03-10 MED ORDER — AZILSARTAN MEDOXOMIL 80 MG PO TABS: 1.0000 | ORAL_TABLET | Freq: Every day | ORAL | Status: DC

## 2016-03-10 MED ORDER — DICLOFENAC SODIUM 1 % TD GEL: 4.0000 g | Freq: Four times a day (QID) | TRANSDERMAL | Status: DC

## 2016-03-10 MED ORDER — VARDENAFIL HCL 20 MG PO TABS: ORAL_TABLET | ORAL | Status: DC

## 2016-03-10 MED ORDER — PANTOPRAZOLE SODIUM 40 MG PO TBEC: 40.0000 mg | DELAYED_RELEASE_TABLET | Freq: Every day | ORAL | Status: DC

## 2016-03-10 MED ORDER — DICLOFENAC SODIUM 50 MG PO TBEC: 50.0000 mg | DELAYED_RELEASE_TABLET | Freq: Two times a day (BID) | ORAL | Status: DC | PRN

## 2016-03-10 MED ORDER — SILDENAFIL CITRATE 100 MG PO TABS: 100.0000 mg | ORAL_TABLET | ORAL | Status: DC | PRN

## 2016-03-10 MED ORDER — LORATADINE 10 MG PO TABS: 10.0000 mg | ORAL_TABLET | Freq: Every day | ORAL | Status: DC

## 2016-03-10 MED ORDER — FLUTICASONE PROPIONATE 50 MCG/ACT NA SUSP: 2.0000 | Freq: Every day | NASAL | Status: DC

## 2016-03-10 NOTE — Progress Notes (Signed)
Pre visit review using our clinic review tool, if applicable. No additional management support is needed unless otherwise documented below in the visit note. 

## 2016-03-10 NOTE — Assessment & Plan Note (Addendum)
6/17 Left foot - pain at the ball - ?OA, plantar fasciitis. No claudication X ray Consult Dr Tamala Julian

## 2016-03-10 NOTE — Progress Notes (Signed)
Subjective:  Patient ID: Zachary Valdez, male    DOB: [birth year]  Age: 65 y.o. MRN: [id number]  CC: Annual Exam   HPI Zachary Valdez presents for a well exam C/o L foot pain at the ball - chronic Rash on LEs is getting better  Outpatient Prescriptions Prior to Visit  Medication Sig Dispense Refill  . cholecalciferol (VITAMIN D) 1000 UNITS tablet Take 1 tablet (1,000 Units total) by mouth daily. 100 tablet 3  . diclofenac (VOLTAREN) 50 MG EC tablet Take 1 tablet (50 mg total) by mouth 2 (two) times daily as needed. 60 tablet 3  . EDARBI 80 MG TABS TAKE 1 TABLET BY MOUTH DAILY 30 tablet 11  . fluticasone (FLONASE) 50 MCG/ACT nasal spray Place 2 sprays into both nostrils daily. 48 g 3  . halobetasol (ULTRAVATE) 0.05 % cream Apply topically 2 (two) times daily. 100 g 2  . LEVITRA 20 MG tablet TAKE 1 TABLET(S) BY MOUTH DAILY AS NEEDED 10 tablet 10  . loratadine (CLARITIN) 10 MG tablet Take 1 tablet (10 mg total) by mouth daily. 100 tablet 3  . pantoprazole (PROTONIX) 40 MG tablet Take 1 tablet (40 mg total) by mouth daily. 30 tablet 11  . rosuvastatin (CRESTOR) 40 MG tablet Take 1 tablet (40 mg total) by mouth daily. 90 tablet 3  . vitamin B-12 (CYANOCOBALAMIN) 1000 MCG tablet Take 1 tablet (1,000 mcg total) by mouth daily. 100 tablet 3  . VOLTAREN 1 % GEL APPLY 4 GRAMS TOPICALLY FOUR TIMES DAILY. 200 g 2  . doxycycline (VIBRA-TABS) 100 MG tablet Take 1 tablet (100 mg total) by mouth 2 (two) times daily. (Patient not taking: Reported on 03/10/2016) 60 tablet 1  . predniSONE (DELTASONE) 10 MG tablet Prednisone 10 mg: take 4 tabs a day x 3 days; then 3 tabs a day x 4 days; then 2 tabs a day x 4 days, then 1 tab a day x 6 days, then stop. Take pc. (Patient not taking: Reported on 03/10/2016) 38 tablet 1   No facility-administered medications prior to visit.    ROS Review of Systems  Constitutional: Negative for appetite change, fatigue and unexpected weight change.  HENT: Negative for congestion,  nosebleeds, sneezing, sore throat and trouble swallowing.   Eyes: Negative for itching and visual disturbance.  Respiratory: Negative for cough.   Cardiovascular: Negative for chest pain, palpitations and leg swelling.  Gastrointestinal: Negative for nausea, diarrhea, blood in stool and abdominal distention.  Genitourinary: Negative for frequency and hematuria.  Musculoskeletal: Positive for arthralgias and gait problem. Negative for back pain, joint swelling and neck pain.  Skin: Positive for rash.  Neurological: Negative for dizziness, tremors, speech difficulty and weakness.  Psychiatric/Behavioral: Negative for suicidal ideas, sleep disturbance, dysphoric mood and agitation. The patient is not nervous/anxious.     Objective:  BP 118/76 mmHg  Pulse 88  Ht 6' (1.829 m)  Wt 270 lb (122.471 kg)  BMI 36.61 kg/m2  SpO2 96%  BP Readings from Last 3 Encounters:  03/10/16 118/76  01/20/16 120/80  12/06/15 128/82    Wt Readings from Last 3 Encounters:  03/10/16 270 lb (122.471 kg)  01/20/16 277 lb (125.646 kg)  12/06/15 280 lb (127.007 kg)    Physical Exam  Constitutional: He is oriented to person, place, and time. He appears well-developed. No distress.  NAD  HENT:  Mouth/Throat: Oropharynx is clear and moist.  Eyes: Conjunctivae are normal. Pupils are equal, round, and reactive to light.  Neck: Normal  range of motion. No JVD present. No thyromegaly present.  Cardiovascular: Normal rate, regular rhythm, normal heart sounds and intact distal pulses.  Exam reveals no gallop and no friction rub.   No murmur heard. Pulmonary/Chest: Effort normal and breath sounds normal. No respiratory distress. He has no wheezes. He has no rales. He exhibits no tenderness.  Abdominal: Soft. Bowel sounds are normal. He exhibits no distension and no mass. There is no tenderness. There is no rebound and no guarding.  Musculoskeletal: Normal range of motion. He exhibits tenderness. He exhibits no  edema.  Lymphadenopathy:    He has no cervical adenopathy.  Neurological: He is alert and oriented to person, place, and time. He has normal reflexes. No cranial nerve deficit. He exhibits normal muscle tone. He displays a negative Romberg sign. Coordination and gait normal.  Skin: Skin is warm and dry. Rash noted.  Psychiatric: He has a normal mood and affect. His behavior is normal. Judgment and thought content normal.  Rash on shins is better Left foot - pain at the ball. Pulses are good Obese Rectal deferred to GI  Lab Results  Component Value Date   WBC 8.5 03/10/2016   HGB 15.6 03/10/2016   HCT 46.0 03/10/2016   PLT 185.0 03/10/2016   GLUCOSE 126* 03/10/2016   CHOL 181 03/10/2016   TRIG 110.0 03/10/2016   HDL 44.40 03/10/2016   LDLDIRECT 146.2 10/07/2010   LDLCALC 115* 03/10/2016   ALT 56* 03/10/2016   AST 46* 03/10/2016   NA 138 03/10/2016   K 4.5 03/10/2016   CL 102 03/10/2016   CREATININE 1.14 03/10/2016   BUN 23 03/10/2016   CO2 27 03/10/2016   TSH 4.03 03/10/2016   PSA 0.42 03/10/2016   INR 1.02 08/31/2014   HGBA1C 6.0 03/10/2016    Dg Chest 2 View  08/31/2014  CLINICAL DATA:  Preop for left knee surgery. EXAM: CHEST  2 VIEW COMPARISON:  None. FINDINGS: The heart size and mediastinal contours are within normal limits. Both lungs are clear. The visualized skeletal structures are unremarkable. IMPRESSION: Normal chest x-ray. Electronically Signed   By: Kalman Jewels M.D.   On: 08/31/2014 15:02    Assessment & Plan:   There are no diagnoses linked to this encounter. I have discontinued Mr. Vivona doxycycline and predniSONE. I am also having him maintain his LEVITRA, vitamin B-12, cholecalciferol, pantoprazole, EDARBI, diclofenac, VOLTAREN, fluticasone, loratadine, rosuvastatin, and halobetasol.  No orders of the defined types were placed in this encounter.     Follow-up: No Follow-up on file.  Walker Kehr, MD

## 2016-03-11 ENCOUNTER — Encounter: Payer: Self-pay | Admitting: Internal Medicine

## 2016-03-11 LAB — HEPATITIS C ANTIBODY: HCV Ab: NEGATIVE

## 2016-03-11 MED ORDER — ASPIRIN EC 81 MG PO TBEC: 81.0000 mg | DELAYED_RELEASE_TABLET | Freq: Every day | ORAL | Status: DC

## 2016-03-11 NOTE — Assessment & Plan Note (Signed)
Better Wt loss Derm ref if not well soon

## 2016-03-11 NOTE — Assessment & Plan Note (Signed)
We discussed age appropriate health related issues, including available/recomended screening tests and vaccinations. We discussed a need for adhering to healthy diet and exercise. Labs/EKG were reviewed/ordered. All questions were answered. Colonoscopy   

## 2016-03-20 ENCOUNTER — Encounter: Payer: Self-pay | Admitting: Gastroenterology

## 2016-05-11 ENCOUNTER — Telehealth: Payer: Self-pay | Admitting: Emergency Medicine

## 2016-05-11 DIAGNOSIS — R200 Anesthesia of skin: Secondary | ICD-10-CM

## 2016-05-11 NOTE — Telephone Encounter (Signed)
I ref him to see Dr Jaquita Rector

## 2016-05-11 NOTE — Telephone Encounter (Signed)
Pt called and is still having numbness in his foot. Would you like to see him again or would you like to sent him a referral somewhere? Please advise thanks.

## 2016-05-24 ENCOUNTER — Ambulatory Visit (AMBULATORY_SURGERY_CENTER): Payer: Self-pay

## 2016-05-24 VITALS — Ht 72.0 in | Wt 271.0 lb

## 2016-05-24 DIAGNOSIS — Z1211 Encounter for screening for malignant neoplasm of colon: Secondary | ICD-10-CM

## 2016-05-24 MED ORDER — SUPREP BOWEL PREP KIT 17.5-3.13-1.6 GM/177ML PO SOLN: 1.0000 | Freq: Once | ORAL | 0 refills | Status: AC

## 2016-05-24 NOTE — Progress Notes (Signed)
No allergies to eggs or soy No diet meds No home oxygen No past problems with anesthesia  Has email and internet; registered for emmi

## 2016-05-26 ENCOUNTER — Encounter: Payer: Self-pay | Admitting: Gastroenterology

## 2016-06-07 ENCOUNTER — Encounter: Payer: Self-pay | Admitting: Gastroenterology

## 2016-06-07 ENCOUNTER — Ambulatory Visit (AMBULATORY_SURGERY_CENTER): Payer: Commercial Managed Care - HMO | Admitting: Gastroenterology

## 2016-06-07 VITALS — BP 111/78 | HR 80 | Temp 96.0°F | Resp 22 | Ht 72.0 in | Wt 271.0 lb

## 2016-06-07 DIAGNOSIS — D120 Benign neoplasm of cecum: Secondary | ICD-10-CM

## 2016-06-07 DIAGNOSIS — Z1211 Encounter for screening for malignant neoplasm of colon: Secondary | ICD-10-CM

## 2016-06-07 MED ORDER — SODIUM CHLORIDE 0.9 % IV SOLN: 500.0000 mL | INTRAVENOUS | Status: DC

## 2016-06-07 NOTE — Patient Instructions (Signed)
Colon polyp removed today. Handout given on polyps, diverticulosis, high fiber diet. Resume current medications. Result letter in your mail in 2-3 weeks. Call us with any questions or concerns. Thank you!   YOU HAD AN ENDOSCOPIC PROCEDURE TODAY AT [facility]:   Refer to the procedure report that was given to you for any specific questions about what was found during the examination.  If the procedure report does not answer your questions, please call your gastroenterologist to clarify.  If you requested that your care partner not be given the details of your procedure findings, then the procedure report has been included in a sealed envelope for you to review at your convenience later.  YOU SHOULD EXPECT: Some feelings of bloating in the abdomen. Passage of more gas than usual.  Walking can help get rid of the air that was put into your GI tract during the procedure and reduce the bloating. If you had a lower endoscopy (such as a colonoscopy or flexible sigmoidoscopy) you may notice spotting of blood in your stool or on the toilet paper. If you underwent a bowel prep for your procedure, you may not have a normal bowel movement for a few days.  Please Note:  You might notice some irritation and congestion in your nose or some drainage.  This is from the oxygen used during your procedure.  There is no need for concern and it should clear up in a day or so.  SYMPTOMS TO REPORT IMMEDIATELY:   Following lower endoscopy (colonoscopy or flexible sigmoidoscopy):  Excessive amounts of blood in the stool  Significant tenderness or worsening of abdominal pains  Swelling of the abdomen that is new, acute  Fever of 100F or higher  For urgent or emergent issues, a gastroenterologist can be reached at any hour by calling [phone].   DIET:  We do recommend a small meal at first, but then you may proceed to your regular diet.  Drink plenty of fluids but you should avoid alcoholic  beverages for 24 hours.  ACTIVITY:  You should plan to take it easy for the rest of today and you should NOT DRIVE or use heavy machinery until tomorrow (because of the sedation medicines used during the test).    FOLLOW UP: Our staff will call the number listed on your records the next business day following your procedure to check on you and address any questions or concerns that you may have regarding the information given to you following your procedure. If we do not reach you, we will leave a message.  However, if you are feeling well and you are not experiencing any problems, there is no need to return our call.  We will assume that you have returned to your regular daily activities without incident.  If any biopsies were taken you will be contacted by phone or by letter within the next 1-3 weeks.  Please call us at [phone] if you have not heard about the biopsies in 3 weeks.    SIGNATURES/CONFIDENTIALITY: You and/or your care partner have signed paperwork which will be entered into your electronic medical record.  These signatures attest to the fact that that the information above on your After Visit Summary has been reviewed and is understood.  Full responsibility of the confidentiality of this discharge information lies with you and/or your care-partner.

## 2016-06-07 NOTE — Op Note (Signed)
[facility] Patient Name: Zachary Valdez Procedure Date: 06/07/2016 8:56 AM MRN: [id number] Endoscopist: Ladene Artist , MD Age: 65 Referring MD:  Date of Birth: 05/14/1951 Gender: Male Account #: [id number] Procedure:                Colonoscopy Indications:              Screening for colorectal malignant neoplasm Medicines:                Monitored Anesthesia Care Procedure:                Pre-Anesthesia Assessment:                           - Prior to the procedure, a History and Physical                            was performed, and patient medications and                            allergies were reviewed. The patient's tolerance of                            previous anesthesia was also reviewed. The risks                            and benefits of the procedure and the sedation                            options and risks were discussed with the patient.                            All questions were answered, and informed consent                            was obtained. Prior Anticoagulants: The patient has                            taken no previous anticoagulant or antiplatelet                            agents. ASA Grade Assessment: II - A patient with                            mild systemic disease. After reviewing the risks                            and benefits, the patient was deemed in                            satisfactory condition to undergo the procedure.                           After obtaining informed consent, the colonoscope  was passed under direct vision. Throughout the                            procedure, the patient's blood pressure, pulse, and                            oxygen saturations were monitored continuously. The                            Model PCF-H190L [id number]) scope was introduced                            through the anus and advanced to the the cecum,                            identified by  appendiceal orifice and ileocecal                            valve. The ileocecal valve, appendiceal orifice,                            and rectum were photographed. The quality of the                            bowel preparation was excellent. The colonoscopy                            was performed without difficulty. The patient                            tolerated the procedure well. Scope In: 9:00:32 AM Scope Out: 9:11:52 AM Scope Withdrawal Time: 0 hours 9 minutes 14 seconds  Total Procedure Duration: 0 hours 11 minutes 20 seconds  Findings:                 A 4 mm polyp was found in the cecum. The polyp was                            sessile. The polyp was removed with a cold biopsy                            forceps. Resection and retrieval were complete.                           The exam was otherwise without abnormality on                            direct and retroflexion views.                           A few medium-mouthed diverticula were found in the                            sigmoid colon. Complications:  No immediate complications. Estimated blood loss:                            None. Estimated Blood Loss:     Estimated blood loss: none. Impression:               - One 4 mm polyp in the cecum, removed with a cold                            biopsy forceps. Resected and retrieved.                           - The examination was otherwise normal on direct                            and retroflexion views.                           - Diverticulosis in the sigmoid colon. Recommendation:           - Repeat colonoscopy in 5 years for surveillance if                            polyp is precancerous, otherwise screening                            colonoscopy in 10 years                           - Patient has a contact number available for                            emergencies. The signs and symptoms of potential                            delayed complications  were discussed with the                            patient. Return to normal activities tomorrow.                            Written discharge instructions were provided to the                            patient.                           - High fiber diet.                           - Continue present medications.                           - Await pathology results. Ladene Artist, MD 06/07/2016 9:15:04 AM This report has been signed electronically.

## 2016-06-07 NOTE — Progress Notes (Signed)
Called to room to assist during endoscopic procedure.  Patient ID and intended procedure confirmed with present staff. Received instructions for my participation in the procedure from the performing physician.  

## 2016-06-07 NOTE — Progress Notes (Signed)
Report to PACU, RN, vss, BBS= Clear.  

## 2016-06-08 ENCOUNTER — Telehealth: Payer: Self-pay | Admitting: *Deleted

## 2016-06-08 NOTE — Telephone Encounter (Signed)
  Follow up Call-  Call back number 06/07/2016  Post procedure Call Back phone  # [phone]  Permission to leave phone message Yes  Some recent data might be hidden     Patient questions:  Do you have a fever, pain , or abdominal swelling? No. Pain Score  0 *  Have you tolerated food without any problems? Yes.    Have you been able to return to your normal activities? No.  Do you have any questions about your discharge instructions: Diet   No. Medications  No. Follow up visit  No.  Do you have questions or concerns about your Care? No.  Actions: * If pain score is 4 or above: No action needed, pain <4.

## 2016-06-14 ENCOUNTER — Encounter: Payer: Self-pay | Admitting: Gastroenterology

## 2016-06-20 ENCOUNTER — Encounter: Payer: Self-pay | Admitting: Family Medicine

## 2016-06-20 ENCOUNTER — Ambulatory Visit (INDEPENDENT_AMBULATORY_CARE_PROVIDER_SITE_OTHER): Payer: Commercial Managed Care - HMO | Admitting: Family Medicine

## 2016-06-20 ENCOUNTER — Ambulatory Visit (INDEPENDENT_AMBULATORY_CARE_PROVIDER_SITE_OTHER)
Admission: RE | Admit: 2016-06-20 | Discharge: 2016-06-20 | Disposition: A | Discharge status: Home / Self Care | Payer: Commercial Managed Care - HMO | Source: Ambulatory Visit | Attending: Family Medicine | Admitting: Family Medicine

## 2016-06-20 VITALS — BP 124/88 | HR 66 | Wt 270.0 lb

## 2016-06-20 DIAGNOSIS — M542 Cervicalgia: Secondary | ICD-10-CM

## 2016-06-20 DIAGNOSIS — M79672 Pain in left foot: Secondary | ICD-10-CM | POA: Diagnosis not present

## 2016-06-20 MED ORDER — GABAPENTIN 100 MG PO CAPS
200.0000 mg | ORAL_CAPSULE | Freq: Every day | ORAL | 3 refills | Status: DC
Start: 2016-06-20 — End: 2017-12-07

## 2016-06-20 NOTE — Patient Instructions (Signed)
Good to meet you  Ice bath of the foot at night for 10 minutes Spenco orthotics "total support" online would be great  Keep wearing the merrells.  Avoid being barefoot.  pennsaid pinkie amount topically 2 times daily as needed.  You can also use the voltaren on the foot.  Avoid being barefoot even in the house.  Do not lace last eye on the shoe Gabapentin 200mg  at night For the neck  Xrays downstairs today.  Gabapentin will help as well.  Exercises 3 times a week.  Tennis ball between shoulder blades with sitting long amount of time. At computer or in  Car Vitamins B12 1080mcg daily  B6 200mg  daily .  These will help with the nubmness Vitamin D 2000 iU dialy will help with muscle strength.  See me again in 3-4 weeks.

## 2016-06-20 NOTE — Assessment & Plan Note (Signed)
I believe the patient's left foot pain is decreased secondary to neuroma. I do think that patient may need injection in the long run. Patient does need proper shoes and we discussed transverse arch support. Given topical anti-inflammatories that I think will be beneficial. We did discuss the possibility of gabapentin. We will discuss how patient does on this. Patient will come back in 4 weeks. Worsening symptoms consider injection.

## 2016-06-20 NOTE — Assessment & Plan Note (Signed)
Likely multifactorial. X-rays are pending. Patient given home exercises to work on posture and ergonomics. We discussed which activities to do a which ones to avoid. Patient will come back and see me again in 4-6 weeks. If worsening symptoms consider either advanced imaging or formal physical therapy.

## 2016-06-20 NOTE — Progress Notes (Signed)
[name] [address] [place], [place] [address] Phone: [phone] Subjective:    I'm seeing this patient by the request  of:  Walker Kehr, MD  CC: Left foot numbness   [id number]  Zachary Valdez is a 65 y.o. male coming in with complaint of left foot numbness. Patient states that there is a chronic pain at the ball of his foot. Patient notes that he feels tightness and tingling sensation in the left foot. He states that his foot goes numb by the end of the day. He has to wear steel toed shoes which cause greater irritation. He notes that he had knee replacement on the left knee in 2015. He wonders if his current symptoms are related. He is not experiencing any pain in the foot.   Patient also complains of an increase in neck pain that he has been having over the past 6 months. Pain is on the left side of the cervical spine. Denies any radiating pain into his hand. He believes that the pain is due to the way that he is sleeping. Patient denies any radiation once again down the arm. Denies any weakness. Patient states that it's just a soreness that seems to be more intermittent at the moment.    Past Medical History:  Diagnosis Date  . Allergic rhinitis   . Anxiety   . GERD (gastroesophageal reflux disease)   . Hyperlipidemia   . Hyperplastic colon polyp 2006  . Hypertension   . OA (osteoarthritis)   . Obesity   . Sleep apnea    cpap-   . Ulcerative esophagitis 2006   Dr. Fuller Plan  . Vitamin B 12 deficiency    Past Surgical History:  Procedure Laterality Date  . KNEE ARTHROSCOPY     Right  . TOTAL KNEE ARTHROPLASTY Left 09/07/2014   Procedure: LEFT TOTAL KNEE ARTHROPLASTY;  Surgeon: Gearlean Alf, MD;  Location: [facility];  Service: Orthopedics;  Laterality: Left;   Social History   Social History  . Marital status: Divorced    Spouse name: N/A  . Number of children: N/A  . Years of education: N/A   Social History Main Topics  .  Smoking status: Former Smoker    Types: Cigarettes    Quit date: 10/02/1997  . Smokeless tobacco: Never Used  . Alcohol use 8.4 oz/week    14 Glasses of wine per week  . Drug use: No  . Sexual activity: Not on file   Other Topics Concern  . Not on file   Social History Narrative   Daily caffeine- 2-3 per day   Allergies  Allergen Reactions  . Atorvastatin     REACTION: achy  . Enalapril     cough  . Hydrocodone-Acetaminophen     REACTION: SOB, patient states drug made patient feel weird   . Tramadol     headache   Family History  Problem Relation Age of Onset  . Breast cancer Mother   . Hypertension Mother   . Hypertension Other   . Colon cancer Neg Hx     Past medical history, social, surgical and family history all reviewed in electronic medical record.  No pertanent information unless stated regarding to the chief complaint.   Review of Systems: No headache, visual changes, nausea, vomiting, diarrhea, constipation, dizziness, abdominal pain, skin rash, fevers, chills, night sweats, weight loss, swollen lymph nodes, body aches, joint swelling, muscle aches, chest pain, shortness of breath, mood changes.  Objective  Blood pressure 124/88, pulse 66, weight 270 lb (122.5 kg).  General: No apparent distress alert and oriented x3 mood and affect normal, dressed appropriately.  HEENT: Pupils equal, extraocular movements intact  Respiratory: Patient's speak in full sentences and does not appear short of breath  Cardiovascular: No lower extremity edema, non tender, no erythema  Skin: Warm dry intact with no signs of infection or rash on extremities or on axial skeleton.  Abdomen: Soft nontender  Neuro: Cranial nerves II through XII are intact, neurovascularly intact in all extremities with 2+ DTRs and 2+ pulses.  Lymph: No lymphadenopathy of posterior or anterior cervical chain or axillae bilaterally.  Gait normal with good balance and coordination.  MSK:  Non tender with  full range of motion and good stability and symmetric strength and tone of  elbows, wrist, hip, knee and ankles bilaterally.  Neck: Inspection unremarkable. No palpable stepoffs. Negative Spurling's maneuver. Full neck range of motion mild crepitus with range of motion Grip strength and sensation normal in bilateral hands Strength good C4 to T1 distribution No sensory change to C4 to T1 Negative Hoffman sign bilaterally Reflexes normal Foot exam shows patient December to the transverse arch. Patient does have large callus formation on the plantar aspect of the foot on the second and third toes. Patient does have a positive squeeze test. Appears to be neurovascularly intact. Full range of motion of the ankle noted today.  Procedure note [id number]; 15 minutes spent for Therapeutic exercises as stated in above notes.  This included exercises focusing on stretching, strengthening, with significant focus on eccentric aspects. Neck Exercises that included:  Basic scapular stabilization to include adduction and depression of scapula Scaption, focusing on proper movement and good control Internal and External rotation utilizing a theraband, with elbow tucked at side entire time Rows with theraband    Proper technique shown and discussed handout in great detail with ATC.  All questions were discussed and answered.     Impression and Recommendations:     This case required medical decision making of moderate complexity.      Note: This dictation was prepared with Dragon dictation along with smaller phrase technology. Any transcriptional errors that result from this process are unintentional.

## 2016-09-11 ENCOUNTER — Encounter: Payer: Self-pay | Admitting: Internal Medicine

## 2016-09-11 ENCOUNTER — Ambulatory Visit (INDEPENDENT_AMBULATORY_CARE_PROVIDER_SITE_OTHER): Payer: Commercial Managed Care - HMO | Admitting: Internal Medicine

## 2016-09-11 DIAGNOSIS — N528 Other male erectile dysfunction: Secondary | ICD-10-CM | POA: Diagnosis not present

## 2016-09-11 DIAGNOSIS — I10 Essential (primary) hypertension: Secondary | ICD-10-CM

## 2016-09-11 DIAGNOSIS — E538 Deficiency of other specified B group vitamins: Secondary | ICD-10-CM | POA: Diagnosis not present

## 2016-09-11 DIAGNOSIS — L309 Dermatitis, unspecified: Secondary | ICD-10-CM

## 2016-09-11 MED ORDER — HALOBETASOL PROPIONATE 0.05 % EX CREA: TOPICAL_CREAM | Freq: Two times a day (BID) | CUTANEOUS | 2 refills | Status: DC

## 2016-09-11 MED ORDER — VARDENAFIL HCL 20 MG PO TABS: ORAL_TABLET | ORAL | 10 refills | Status: DC

## 2016-09-11 MED ORDER — SILDENAFIL CITRATE 100 MG PO TABS: 100.0000 mg | ORAL_TABLET | ORAL | 6 refills | Status: DC | PRN

## 2016-09-11 NOTE — Assessment & Plan Note (Signed)
On B12 

## 2016-09-11 NOTE — Patient Instructions (Signed)

## 2016-09-11 NOTE — Assessment & Plan Note (Signed)
Levitra Testosterne

## 2016-09-11 NOTE — Progress Notes (Signed)
Subjective:  Patient ID: Zachary Valdez, male    DOB: [birth year]  Age: 65 y.o. MRN: [id number]  CC: No chief complaint on file.   HPI Zachary Valdez presents for LLE rash, wt gain, GERD C/o ED x 2 mo  Outpatient Medications Prior to Visit  Medication Sig Dispense Refill  . aspirin EC 81 MG tablet Take 1 tablet (81 mg total) by mouth daily. 100 tablet 3  . Azilsartan Medoxomil (EDARBI) 80 MG TABS Take 1 tablet (80 mg total) by mouth daily. 30 tablet 11  . diclofenac sodium (VOLTAREN) 1 % GEL Apply 4 g topically 4 (four) times daily. 200 g 2  . fluticasone (FLONASE) 50 MCG/ACT nasal spray Place 2 sprays into both nostrils daily. 48 g 3  . gabapentin (NEURONTIN) 100 MG capsule Take 2 capsules (200 mg total) by mouth at bedtime. 60 capsule 3  . halobetasol (ULTRAVATE) 0.05 % cream Apply topically 2 (two) times daily. 100 g 2  . loratadine (CLARITIN) 10 MG tablet Take 1 tablet (10 mg total) by mouth daily. 100 tablet 3  . pantoprazole (PROTONIX) 40 MG tablet Take 1 tablet (40 mg total) by mouth daily. 90 tablet 3  . rosuvastatin (CRESTOR) 40 MG tablet Take 1 tablet (40 mg total) by mouth daily. (Patient taking differently: Take 20 mg by mouth daily. ) 90 tablet 3  . sildenafil (VIAGRA) 100 MG tablet Take 1 tablet (100 mg total) by mouth as needed for erectile dysfunction. 12 tablet 6  . vardenafil (LEVITRA) 20 MG tablet TAKE 1 TABLET(S) BY MOUTH DAILY AS NEEDED 12 tablet 10  . vitamin B-12 (CYANOCOBALAMIN) 1000 MCG tablet Take 1 tablet (1,000 mcg total) by mouth daily. 100 tablet 3   Facility-Administered Medications Prior to Visit  Medication Dose Route Frequency Provider Last Rate Last Dose  . 0.9 %  sodium chloride infusion  500 mL Intravenous Continuous Ladene Artist, MD        ROS Review of Systems  Constitutional: Positive for unexpected weight change. Negative for appetite change and fatigue.  HENT: Negative for congestion, nosebleeds, sneezing, sore throat and trouble swallowing.    Eyes: Negative for itching and visual disturbance.  Respiratory: Negative for cough.   Cardiovascular: Negative for chest pain, palpitations and leg swelling.  Gastrointestinal: Negative for abdominal distention, blood in stool, diarrhea and nausea.  Genitourinary: Negative for frequency and hematuria.  Musculoskeletal: Negative for back pain, gait problem, joint swelling and neck pain.  Skin: Positive for rash.  Neurological: Negative for dizziness, tremors, speech difficulty and weakness.  Psychiatric/Behavioral: Negative for agitation, dysphoric mood and sleep disturbance. The patient is not nervous/anxious.     Objective:  BP 132/80   Pulse 87   Wt 274 lb (124.3 kg)   SpO2 96%   BMI 37.16 kg/m   BP Readings from Last 3 Encounters:  09/11/16 132/80  06/20/16 124/88  06/07/16 111/78    Wt Readings from Last 3 Encounters:  09/11/16 274 lb (124.3 kg)  06/20/16 270 lb (122.5 kg)  06/07/16 271 lb (122.9 kg)    Physical Exam  Constitutional: He is oriented to person, place, and time. He appears well-developed. No distress.  NAD  HENT:  Mouth/Throat: Oropharynx is clear and moist.  Eyes: Conjunctivae are normal. Pupils are equal, round, and reactive to light.  Neck: Normal range of motion. No JVD present. No thyromegaly present.  Cardiovascular: Normal rate, regular rhythm, normal heart sounds and intact distal pulses.  Exam reveals no gallop  and no friction rub.   No murmur heard. Pulmonary/Chest: Effort normal and breath sounds normal. No respiratory distress. He has no wheezes. He has no rales. He exhibits no tenderness.  Abdominal: Soft. Bowel sounds are normal. He exhibits no distension and no mass. There is no tenderness. There is no rebound and no guarding.  Musculoskeletal: Normal range of motion. He exhibits edema. He exhibits no tenderness.  Lymphadenopathy:    He has no cervical adenopathy.  Neurological: He is alert and oriented to person, place, and time. He  has normal reflexes. No cranial nerve deficit. He exhibits normal muscle tone. He displays a negative Romberg sign. Coordination and gait normal.  Skin: Skin is warm and dry. Rash noted.  Psychiatric: He has a normal mood and affect. His behavior is normal. Judgment and thought content normal.  LLE w/dist rash  Lab Results  Component Value Date   WBC 8.5 03/10/2016   HGB 15.6 03/10/2016   HCT 46.0 03/10/2016   PLT 185.0 03/10/2016   GLUCOSE 126 (H) 03/10/2016   CHOL 181 03/10/2016   TRIG 110.0 03/10/2016   HDL 44.40 03/10/2016   LDLDIRECT 146.2 10/07/2010   LDLCALC 115 (H) 03/10/2016   ALT 56 (H) 03/10/2016   AST 46 (H) 03/10/2016   NA 138 03/10/2016   K 4.5 03/10/2016   CL 102 03/10/2016   CREATININE 1.14 03/10/2016   BUN 23 03/10/2016   CO2 27 03/10/2016   TSH 4.03 03/10/2016   PSA 0.42 03/10/2016   INR 1.02 08/31/2014   HGBA1C 6.0 03/10/2016    Dg Cervical Spine Complete  Result Date: 06/20/2016 CLINICAL DATA:  Neck pain and left shoulder pain EXAM: CERVICAL SPINE - COMPLETE 4+ VIEW COMPARISON:  None. FINDINGS: Normal alignment with straightening of the cervical lordosis. Disc degeneration and spurring most prominent C5-6 and C6-7. Mild right foraminal narrowing at C3-4 due to spurring. Mild right foraminal narrowing C5-6 and C6-7. Mild left foraminal narrowing C5-6 and C6-7 due to spurring. Negative for fracture or mass.  Soft tissues normal. IMPRESSION: Cervical spondylosis with mild foraminal narrowing bilaterally due to spurring. Electronically Signed   By: Franchot Gallo M.D.   On: 06/20/2016 09:41    Assessment & Plan:   There are no diagnoses linked to this encounter. I am having Mr. Ramos maintain his vitamin B-12, rosuvastatin, halobetasol, Azilsartan Medoxomil, fluticasone, vardenafil, loratadine, pantoprazole, diclofenac sodium, sildenafil, aspirin EC, gabapentin, and diclofenac. We will continue to administer sodium chloride.  Meds ordered this encounter    Medications  . diclofenac (VOLTAREN) 50 MG EC tablet    Sig: Take 1 tablet by mouth 2 (two) times daily as needed.     Follow-up: No Follow-up on file.  Walker Kehr, MD

## 2016-09-11 NOTE — Progress Notes (Signed)
Pre visit review using our clinic review tool, if applicable. No additional management support is needed unless otherwise documented below in the visit note. 

## 2016-09-11 NOTE — Assessment & Plan Note (Signed)
[facility] [place]

## 2017-01-09 ENCOUNTER — Other Ambulatory Visit: Payer: Self-pay | Admitting: Internal Medicine

## 2017-01-18 ENCOUNTER — Encounter: Payer: Self-pay | Admitting: Internal Medicine

## 2017-01-18 ENCOUNTER — Ambulatory Visit (INDEPENDENT_AMBULATORY_CARE_PROVIDER_SITE_OTHER): Payer: Commercial Managed Care - HMO | Admitting: Internal Medicine

## 2017-01-18 VITALS — BP 120/76 | HR 82 | Temp 98.8°F | Ht 72.0 in | Wt 262.0 lb

## 2017-01-18 DIAGNOSIS — J0100 Acute maxillary sinusitis, unspecified: Secondary | ICD-10-CM

## 2017-01-18 DIAGNOSIS — Z0000 Encounter for general adult medical examination without abnormal findings: Secondary | ICD-10-CM | POA: Diagnosis not present

## 2017-01-18 DIAGNOSIS — R7309 Other abnormal glucose: Secondary | ICD-10-CM | POA: Diagnosis not present

## 2017-01-18 MED ORDER — CEFDINIR 300 MG PO CAPS: 300.0000 mg | ORAL_CAPSULE | Freq: Two times a day (BID) | ORAL | 0 refills | Status: DC

## 2017-01-18 MED ORDER — FLUTICASONE PROPIONATE 50 MCG/ACT NA SUSP: 2.0000 | Freq: Every day | NASAL | 11 refills | Status: DC

## 2017-01-18 NOTE — Progress Notes (Signed)
Subjective:  Patient ID: Zachary Valdez, male    DOB: 29-Nov-1950  Age: 66 y.o. MRN: [id number]  CC: No chief complaint on file.   HPI Zachary Valdez presents for sinusitis sx's x 2 weeks C/o L cheeck bone and sinus and L eye pain  Outpatient Medications Prior to Visit  Medication Sig Dispense Refill  . aspirin EC 81 MG tablet Take 1 tablet (81 mg total) by mouth daily. 100 tablet 3  . Azilsartan Medoxomil (EDARBI) 80 MG TABS Take 1 tablet (80 mg total) by mouth daily. 30 tablet 11  . diclofenac (VOLTAREN) 50 MG EC tablet Take 1 tablet by mouth 2 (two) times daily as needed.    . diclofenac sodium (VOLTAREN) 1 % GEL Apply 4 g topically 4 (four) times daily. 200 g 2  . fluticasone (FLONASE) 50 MCG/ACT nasal spray Place 2 sprays into both nostrils daily. 48 g 3  . gabapentin (NEURONTIN) 100 MG capsule Take 2 capsules (200 mg total) by mouth at bedtime. 60 capsule 3  . halobetasol (ULTRAVATE) 0.05 % cream Apply topically 2 (two) times daily. 100 g 2  . loratadine (CLARITIN) 10 MG tablet Take 1 tablet (10 mg total) by mouth daily. 100 tablet 3  . pantoprazole (PROTONIX) 40 MG tablet Take 1 tablet (40 mg total) by mouth daily. 90 tablet 3  . rosuvastatin (CRESTOR) 40 MG tablet Take 1 tablet (40 mg total) by mouth daily. Yearly physical due in June must see MD for refills 30 tablet 2  . vardenafil (LEVITRA) 20 MG tablet TAKE 1 TABLET(S) BY MOUTH DAILY AS NEEDED 30 tablet 10  . vitamin B-12 (CYANOCOBALAMIN) 1000 MCG tablet Take 1 tablet (1,000 mcg total) by mouth daily. 100 tablet 3   Facility-Administered Medications Prior to Visit  Medication Dose Route Frequency Provider Last Rate Last Dose  . 0.9 %  sodium chloride infusion  500 mL Intravenous Continuous Ladene Artist, MD        ROS Review of Systems  Constitutional: Negative for appetite change, fatigue and unexpected weight change.  HENT: Positive for rhinorrhea, sinus pain and sinus pressure. Negative for congestion, nosebleeds,  sneezing, sore throat and trouble swallowing.   Eyes: Positive for pain. Negative for itching and visual disturbance.  Respiratory: Negative for cough.   Cardiovascular: Negative for chest pain, palpitations and leg swelling.  Gastrointestinal: Negative for abdominal distention, blood in stool, diarrhea and nausea.  Genitourinary: Negative for frequency and hematuria.  Musculoskeletal: Negative for back pain, gait problem, joint swelling and neck pain.  Skin: Negative for rash.  Neurological: Negative for dizziness, tremors, speech difficulty and weakness.  Psychiatric/Behavioral: Negative for agitation, dysphoric mood and sleep disturbance. The patient is not nervous/anxious.     Objective:  BP 120/76 (BP Location: Left Arm, Patient Position: Sitting, Cuff Size: Large)   Pulse 82   Temp 98.8 F (37.1 C) (Oral)   Ht 6' (1.829 m)   Wt 262 lb 0.6 oz (118.9 kg)   SpO2 99%   BMI 35.54 kg/m   BP Readings from Last 3 Encounters:  01/18/17 120/76  09/11/16 132/80  06/20/16 124/88    Wt Readings from Last 3 Encounters:  01/18/17 262 lb 0.6 oz (118.9 kg)  09/11/16 274 lb (124.3 kg)  06/20/16 270 lb (122.5 kg)    Physical Exam  Constitutional: He is oriented to person, place, and time. He appears well-developed. No distress.  NAD  HENT:  Mouth/Throat: Oropharynx is clear and moist.  Eyes: Conjunctivae  are normal. Pupils are equal, round, and reactive to light.  Neck: Normal range of motion. No JVD present. No thyromegaly present.  Cardiovascular: Normal rate, regular rhythm, normal heart sounds and intact distal pulses.  Exam reveals no gallop and no friction rub.   No murmur heard. Pulmonary/Chest: Effort normal and breath sounds normal. No respiratory distress. He has no wheezes. He has no rales. He exhibits no tenderness.  Abdominal: Soft. Bowel sounds are normal. He exhibits no distension and no mass. There is no tenderness. There is no rebound and no guarding.    Musculoskeletal: Normal range of motion. He exhibits no edema or tenderness.  Lymphadenopathy:    He has no cervical adenopathy.  Neurological: He is alert and oriented to person, place, and time. He has normal reflexes. No cranial nerve deficit. He exhibits normal muscle tone. He displays a negative Romberg sign. Coordination and gait normal.  Skin: Skin is warm and dry. No rash noted.  Psychiatric: He has a normal mood and affect. His behavior is normal. Judgment and thought content normal.    Lab Results  Component Value Date   WBC 8.5 03/10/2016   HGB 15.6 03/10/2016   HCT 46.0 03/10/2016   PLT 185.0 03/10/2016   GLUCOSE 126 (H) 03/10/2016   CHOL 181 03/10/2016   TRIG 110.0 03/10/2016   HDL 44.40 03/10/2016   LDLDIRECT 146.2 10/07/2010   LDLCALC 115 (H) 03/10/2016   ALT 56 (H) 03/10/2016   AST 46 (H) 03/10/2016   NA 138 03/10/2016   K 4.5 03/10/2016   CL 102 03/10/2016   CREATININE 1.14 03/10/2016   BUN 23 03/10/2016   CO2 27 03/10/2016   TSH 4.03 03/10/2016   PSA 0.42 03/10/2016   INR 1.02 08/31/2014   HGBA1C 6.0 03/10/2016    Dg Cervical Spine Complete  Result Date: 06/20/2016 CLINICAL DATA:  Neck pain and left shoulder pain EXAM: CERVICAL SPINE - COMPLETE 4+ VIEW COMPARISON:  None. FINDINGS: Normal alignment with straightening of the cervical lordosis. Disc degeneration and spurring most prominent C5-6 and C6-7. Mild right foraminal narrowing at C3-4 due to spurring. Mild right foraminal narrowing C5-6 and C6-7. Mild left foraminal narrowing C5-6 and C6-7 due to spurring. Negative for fracture or mass.  Soft tissues normal. IMPRESSION: Cervical spondylosis with mild foraminal narrowing bilaterally due to spurring. Electronically Signed   By: Franchot Gallo M.D.   On: 06/20/2016 09:41    Assessment & Plan:   There are no diagnoses linked to this encounter. I am having [name] maintain his vitamin B-12, Azilsartan Medoxomil, fluticasone, loratadine, pantoprazole,  diclofenac sodium, aspirin EC, gabapentin, diclofenac, vardenafil, halobetasol, and rosuvastatin. We will continue to administer sodium chloride.  No orders of the defined types were placed in this encounter.    Follow-up: No Follow-up on file.  Walker Kehr, MD

## 2017-01-18 NOTE — Progress Notes (Signed)
Pre visit review using our clinic review tool, if applicable. No additional management support is needed unless otherwise documented below in the visit note. 

## 2017-01-18 NOTE — Assessment & Plan Note (Signed)
Cefdinir x10 d 

## 2017-03-26 ENCOUNTER — Encounter: Payer: Commercial Managed Care - HMO | Admitting: Internal Medicine

## 2017-03-29 ENCOUNTER — Other Ambulatory Visit (INDEPENDENT_AMBULATORY_CARE_PROVIDER_SITE_OTHER): Payer: 59

## 2017-03-29 DIAGNOSIS — R7309 Other abnormal glucose: Secondary | ICD-10-CM

## 2017-03-29 DIAGNOSIS — Z0000 Encounter for general adult medical examination without abnormal findings: Secondary | ICD-10-CM

## 2017-03-29 LAB — LIPID PANEL
Cholesterol: 185 mg/dL (ref 0–200)
HDL: 51.1 mg/dL (ref >39.00)
LDL Cholesterol: 106 mg/dL — ABNORMAL HIGH (ref 0–99)
NONHDL: 134.03
TRIGLYCERIDES: 142 mg/dL (ref 0.0–149.0)
Total CHOL/HDL Ratio: 4
VLDL: 28.4 mg/dL (ref 0.0–40.0)

## 2017-03-29 LAB — CBC WITH DIFFERENTIAL/PLATELET
BASOS ABS: 0 10*3/uL (ref 0.0–0.1)
BASOS PCT: 0.2 % (ref 0.0–3.0)
Eosinophils Absolute: 0.1 10*3/uL (ref 0.0–0.7)
Eosinophils Relative: 1.7 % (ref 0.0–5.0)
HEMATOCRIT: 44.6 % (ref 39.0–52.0)
Hemoglobin: 15.3 g/dL (ref 13.0–17.0)
LYMPHS PCT: 16.5 % (ref 12.0–46.0)
Lymphs Abs: 1 10*3/uL (ref 0.7–4.0)
MCHC: 34.4 g/dL (ref 30.0–36.0)
MCV: 104.4 fl — AB (ref 78.0–100.0)
MONOS PCT: 12.4 % — AB (ref 3.0–12.0)
Monocytes Absolute: 0.8 10*3/uL (ref 0.1–1.0)
NEUTROS ABS: 4.3 10*3/uL (ref 1.4–7.7)
Neutrophils Relative %: 69.2 % (ref 43.0–77.0)
PLATELETS: 136 10*3/uL — AB (ref 150.0–400.0)
RBC: 4.27 Mil/uL (ref 4.22–5.81)
RDW: 12.9 % (ref 11.5–15.5)
WBC: 6.3 10*3/uL (ref 4.0–10.5)

## 2017-03-29 LAB — HEPATIC FUNCTION PANEL
ALT: 65 U/L — AB (ref 0–53)
AST: 44 U/L — AB (ref 0–37)
Albumin: 4.4 g/dL (ref 3.5–5.2)
Alkaline Phosphatase: 41 U/L (ref 39–117)
Bilirubin, Direct: 0.2 mg/dL (ref 0.0–0.3)
TOTAL PROTEIN: 6.8 g/dL (ref 6.0–8.3)
Total Bilirubin: 0.9 mg/dL (ref 0.2–1.2)

## 2017-03-29 LAB — BASIC METABOLIC PANEL
BUN: 18 mg/dL (ref 6–23)
CALCIUM: 9.6 mg/dL (ref 8.4–10.5)
CHLORIDE: 101 meq/L (ref 96–112)
CO2: 28 mEq/L (ref 19–32)
Creatinine, Ser: 1.09 mg/dL (ref 0.40–1.50)
GFR: 72.36 mL/min (ref >60.00)
Glucose, Bld: 120 mg/dL — ABNORMAL HIGH (ref 70–99)
Potassium: 4.7 mEq/L (ref 3.5–5.1)
SODIUM: 138 meq/L (ref 135–145)

## 2017-03-29 LAB — URINALYSIS
BILIRUBIN URINE: NEGATIVE
HGB URINE DIPSTICK: NEGATIVE
KETONES UR: NEGATIVE
LEUKOCYTES UA: NEGATIVE
Nitrite: NEGATIVE
Specific Gravity, Urine: 1.015 (ref 1.000–1.030)
Total Protein, Urine: NEGATIVE
UROBILINOGEN UA: 0.2 (ref 0.0–1.0)
Urine Glucose: NEGATIVE
pH: 6 (ref 5.0–8.0)

## 2017-03-29 LAB — TSH: TSH: 3.65 u[IU]/mL (ref 0.35–4.50)

## 2017-03-29 LAB — HEMOGLOBIN A1C: Hgb A1c MFr Bld: 6 % (ref 4.6–6.5)

## 2017-03-29 LAB — PSA: PSA: 0.58 ng/mL (ref 0.10–4.00)

## 2017-04-02 ENCOUNTER — Encounter: Payer: Self-pay | Admitting: Internal Medicine

## 2017-04-02 ENCOUNTER — Ambulatory Visit (INDEPENDENT_AMBULATORY_CARE_PROVIDER_SITE_OTHER): Payer: 59 | Admitting: Internal Medicine

## 2017-04-02 VITALS — BP 118/72 | HR 81 | Temp 98.5°F | Ht 72.0 in | Wt 255.0 lb

## 2017-04-02 DIAGNOSIS — R21 Rash and other nonspecific skin eruption: Secondary | ICD-10-CM

## 2017-04-02 DIAGNOSIS — E785 Hyperlipidemia, unspecified: Secondary | ICD-10-CM

## 2017-04-02 DIAGNOSIS — E538 Deficiency of other specified B group vitamins: Secondary | ICD-10-CM | POA: Diagnosis not present

## 2017-04-02 DIAGNOSIS — N528 Other male erectile dysfunction: Secondary | ICD-10-CM

## 2017-04-02 DIAGNOSIS — Z0000 Encounter for general adult medical examination without abnormal findings: Secondary | ICD-10-CM

## 2017-04-02 DIAGNOSIS — D485 Neoplasm of uncertain behavior of skin: Secondary | ICD-10-CM

## 2017-04-02 DIAGNOSIS — R7989 Other specified abnormal findings of blood chemistry: Secondary | ICD-10-CM

## 2017-04-02 DIAGNOSIS — R7309 Other abnormal glucose: Secondary | ICD-10-CM

## 2017-04-02 DIAGNOSIS — I10 Essential (primary) hypertension: Secondary | ICD-10-CM | POA: Diagnosis not present

## 2017-04-02 DIAGNOSIS — R945 Abnormal results of liver function studies: Secondary | ICD-10-CM

## 2017-04-02 MED ORDER — TADALAFIL 20 MG PO TABS: 20.0000 mg | ORAL_TABLET | Freq: Every day | ORAL | 6 refills | Status: DC | PRN

## 2017-04-02 MED ORDER — AZILSARTAN MEDOXOMIL 80 MG PO TABS
1.0000 | ORAL_TABLET | Freq: Every day | ORAL | 3 refills | Status: DC
Start: 2017-04-02 — End: 2018-02-01

## 2017-04-02 MED ORDER — ROSUVASTATIN CALCIUM 40 MG PO TABS: 40.0000 mg | ORAL_TABLET | Freq: Every day | ORAL | 3 refills | Status: DC

## 2017-04-02 MED ORDER — PANTOPRAZOLE SODIUM 40 MG PO TBEC: 40.0000 mg | DELAYED_RELEASE_TABLET | Freq: Every day | ORAL | 3 refills | Status: DC

## 2017-04-02 NOTE — Assessment & Plan Note (Signed)
take qod due to elev LFTs

## 2017-04-02 NOTE — Progress Notes (Signed)
Subjective:  Patient ID: Zachary Valdez, male    DOB: 07/05/1951  Age: 66 y.o. MRN: [id number]  CC: No chief complaint on file.   HPI ISAHIA HOLLERBACH presents for a well exam f/u. Lost wt   Outpatient Medications Prior to Visit  Medication Sig Dispense Refill  . aspirin EC 81 MG tablet Take 1 tablet (81 mg total) by mouth daily. 100 tablet 3  . Azilsartan Medoxomil (EDARBI) 80 MG TABS Take 1 tablet (80 mg total) by mouth daily. 30 tablet 11  . cefdinir (OMNICEF) 300 MG capsule Take 1 capsule (300 mg total) by mouth 2 (two) times daily. 20 capsule 0  . diclofenac (VOLTAREN) 50 MG EC tablet Take 1 tablet by mouth 2 (two) times daily as needed.    . diclofenac sodium (VOLTAREN) 1 % GEL Apply 4 g topically 4 (four) times daily. 200 g 2  . fluticasone (FLONASE) 50 MCG/ACT nasal spray Place 2 sprays into both nostrils daily. 16 g 11  . gabapentin (NEURONTIN) 100 MG capsule Take 2 capsules (200 mg total) by mouth at bedtime. 60 capsule 3  . halobetasol (ULTRAVATE) 0.05 % cream Apply topically 2 (two) times daily. 100 g 2  . loratadine (CLARITIN) 10 MG tablet Take 1 tablet (10 mg total) by mouth daily. 100 tablet 3  . pantoprazole (PROTONIX) 40 MG tablet Take 1 tablet (40 mg total) by mouth daily. 90 tablet 3  . rosuvastatin (CRESTOR) 40 MG tablet Take 1 tablet (40 mg total) by mouth daily. Yearly physical due in June must see MD for refills 30 tablet 2  . vardenafil (LEVITRA) 20 MG tablet TAKE 1 TABLET(S) BY MOUTH DAILY AS NEEDED 30 tablet 10  . vitamin B-12 (CYANOCOBALAMIN) 1000 MCG tablet Take 1 tablet (1,000 mcg total) by mouth daily. 100 tablet 3   Facility-Administered Medications Prior to Visit  Medication Dose Route Frequency Provider Last Rate Last Dose  . 0.9 %  sodium chloride infusion  500 mL Intravenous Continuous Ladene Artist, MD        ROS Review of Systems  Constitutional: Negative for appetite change, fatigue and unexpected weight change.  HENT: Negative for congestion,  nosebleeds, sneezing, sore throat and trouble swallowing.   Eyes: Negative for itching and visual disturbance.  Respiratory: Negative for cough.   Cardiovascular: Negative for chest pain, palpitations and leg swelling.  Gastrointestinal: Negative for abdominal distention, blood in stool, diarrhea and nausea.  Genitourinary: Negative for frequency and hematuria.  Musculoskeletal: Negative for back pain, gait problem, joint swelling and neck pain.  Skin: Negative for rash.  Neurological: Negative for dizziness, tremors, speech difficulty and weakness.  Psychiatric/Behavioral: Negative for agitation, dysphoric mood and sleep disturbance. The patient is not nervous/anxious.     Objective:  BP 118/72 (BP Location: Left Arm, Patient Position: Sitting, Cuff Size: Large)   Pulse 81   Temp 98.5 F (36.9 C) (Oral)   Ht 6' (1.829 m)   Wt 255 lb (115.7 kg)   SpO2 99%   BMI 34.58 kg/m   BP Readings from Last 3 Encounters:  04/02/17 118/72  01/18/17 120/76  09/11/16 132/80    Wt Readings from Last 3 Encounters:  04/02/17 255 lb (115.7 kg)  01/18/17 262 lb 0.6 oz (118.9 kg)  09/11/16 274 lb (124.3 kg)    Physical Exam  Constitutional: He is oriented to person, place, and time. He appears well-developed and well-nourished. No distress.  HENT:  Head: Normocephalic and atraumatic.  Right Ear: External  ear normal.  Left Ear: External ear normal.  Nose: Nose normal.  Mouth/Throat: Oropharynx is clear and moist. No oropharyngeal exudate.  Eyes: Conjunctivae and EOM are normal. Pupils are equal, round, and reactive to light. Right eye exhibits no discharge. Left eye exhibits no discharge. No scleral icterus.  Neck: Normal range of motion. Neck supple. No JVD present. No tracheal deviation present. No thyromegaly present.  Cardiovascular: Normal rate, regular rhythm, normal heart sounds and intact distal pulses.  Exam reveals no gallop and no friction rub.   No murmur heard. Pulmonary/Chest:  Effort normal and breath sounds normal. No stridor. No respiratory distress. He has no wheezes. He has no rales. He exhibits no tenderness.  Abdominal: Soft. Bowel sounds are normal. He exhibits no distension and no mass. There is no tenderness. There is no rebound and no guarding.  Genitourinary: Rectum normal, prostate normal and penis normal. Rectal exam shows guaiac negative stool. No penile tenderness.  Musculoskeletal: Normal range of motion. He exhibits no edema or tenderness.  Lymphadenopathy:    He has no cervical adenopathy.  Neurological: He is alert and oriented to person, place, and time. He has normal reflexes. No cranial nerve deficit. He exhibits normal muscle tone. Coordination normal.  Skin: Skin is warm and dry. No rash noted. He is not diaphoretic. No erythema. No pallor.  Psychiatric: He has a normal mood and affect. His behavior is normal. Judgment and thought content normal.  Obese L lower abd skin lesion 21x9 mm - eryth papule  Lab Results  Component Value Date   WBC 6.3 03/29/2017   HGB 15.3 03/29/2017   HCT 44.6 03/29/2017   PLT 136.0 (L) 03/29/2017   GLUCOSE 120 (H) 03/29/2017   CHOL 185 03/29/2017   TRIG 142.0 03/29/2017   HDL 51.10 03/29/2017   LDLDIRECT 146.2 10/07/2010   LDLCALC 106 (H) 03/29/2017   ALT 65 (H) 03/29/2017   AST 44 (H) 03/29/2017   NA 138 03/29/2017   K 4.7 03/29/2017   CL 101 03/29/2017   CREATININE 1.09 03/29/2017   BUN 18 03/29/2017   CO2 28 03/29/2017   TSH 3.65 03/29/2017   PSA 0.58 03/29/2017   INR 1.02 08/31/2014   HGBA1C 6.0 03/29/2017    Dg Cervical Spine Complete  Result Date: 06/20/2016 CLINICAL DATA:  Neck pain and left shoulder pain EXAM: CERVICAL SPINE - COMPLETE 4+ VIEW COMPARISON:  None. FINDINGS: Normal alignment with straightening of the cervical lordosis. Disc degeneration and spurring most prominent C5-6 and C6-7. Mild right foraminal narrowing at C3-4 due to spurring. Mild right foraminal narrowing C5-6 and  C6-7. Mild left foraminal narrowing C5-6 and C6-7 due to spurring. Negative for fracture or mass.  Soft tissues normal. IMPRESSION: Cervical spondylosis with mild foraminal narrowing bilaterally due to spurring. Electronically Signed   By: Franchot Gallo M.D.   On: 06/20/2016 09:41    Assessment & Plan:   There are no diagnoses linked to this encounter. I am having Mr. Depuy maintain his vitamin B-12, Azilsartan Medoxomil, loratadine, pantoprazole, diclofenac sodium, aspirin EC, gabapentin, diclofenac, vardenafil, halobetasol, rosuvastatin, cefdinir, and fluticasone. We will continue to administer sodium chloride.  No orders of the defined types were placed in this encounter.    Follow-up: No Follow-up on file.  Walker Kehr, MD

## 2017-04-02 NOTE — Assessment & Plan Note (Signed)
7/18 abdomen - sch a skin bx

## 2017-04-02 NOTE — Assessment & Plan Note (Signed)
On Edarbi 

## 2017-04-02 NOTE — Assessment & Plan Note (Signed)
We discussed age appropriate health related issues, including available/recomended screening tests and vaccinations. We discussed a need for adhering to healthy diet and exercise. Labs were ordered to be later reviewed . All questions were answered.   

## 2017-04-02 NOTE — Assessment & Plan Note (Signed)
Better w/wt loss  

## 2017-04-02 NOTE — Assessment & Plan Note (Signed)
Cont w/wt loss 

## 2017-04-02 NOTE — Assessment & Plan Note (Addendum)
On Crestor - take qod due to elev LFTs

## 2017-04-02 NOTE — Assessment & Plan Note (Signed)
Cialis prn 

## 2017-04-02 NOTE — Assessment & Plan Note (Signed)
On B12 

## 2017-05-12 ENCOUNTER — Other Ambulatory Visit: Payer: Self-pay | Admitting: Internal Medicine

## 2017-05-15 ENCOUNTER — Other Ambulatory Visit: Payer: Self-pay | Admitting: General Practice

## 2017-05-15 MED ORDER — HALOBETASOL PROPIONATE 0.05 % EX CREA: TOPICAL_CREAM | Freq: Two times a day (BID) | CUTANEOUS | 2 refills | Status: DC

## 2017-06-18 ENCOUNTER — Other Ambulatory Visit: Payer: Self-pay | Admitting: Internal Medicine

## 2017-10-04 ENCOUNTER — Encounter: Payer: Self-pay | Admitting: Internal Medicine

## 2017-10-04 ENCOUNTER — Ambulatory Visit: Payer: 59 | Admitting: Internal Medicine

## 2017-10-04 DIAGNOSIS — K219 Gastro-esophageal reflux disease without esophagitis: Secondary | ICD-10-CM

## 2017-10-04 DIAGNOSIS — I10 Essential (primary) hypertension: Secondary | ICD-10-CM | POA: Diagnosis not present

## 2017-10-04 DIAGNOSIS — L309 Dermatitis, unspecified: Secondary | ICD-10-CM | POA: Diagnosis not present

## 2017-10-04 DIAGNOSIS — L439 Lichen planus, unspecified: Secondary | ICD-10-CM

## 2017-10-04 MED ORDER — METHYLPREDNISOLONE ACETATE 40 MG/ML IJ SUSP
40.0000 mg | Freq: Once | INTRAMUSCULAR | Status: AC
  Administered 2017-10-04: 40 mg via INTRALESIONAL

## 2017-10-04 NOTE — Patient Instructions (Signed)
Use [place] brand soap, skin lotion

## 2017-10-04 NOTE — Progress Notes (Signed)
Subjective:  Patient ID: Zachary Valdez, male    DOB: May 26, 1951  Age: 67 y.o. MRN: [id number]  CC: No chief complaint on file.   HPI Zachary Valdez presents for OA, LBP, HTN f/u C/o skin lesions  Outpatient Medications Prior to Visit  Medication Sig Dispense Refill  . aspirin EC 81 MG tablet Take 1 tablet (81 mg total) by mouth daily. 100 tablet 3  . Azilsartan Medoxomil (EDARBI) 80 MG TABS Take 1 tablet (80 mg total) by mouth daily. 90 tablet 3  . diclofenac (VOLTAREN) 50 MG EC tablet Take 1 tablet by mouth 2 (two) times daily as needed.    . diclofenac (VOLTAREN) 50 MG EC tablet TAKE 1 TABLET BY MOUTH TWICE A DAY AS NEEDED 60 tablet 2  . diclofenac sodium (VOLTAREN) 1 % GEL Apply 4 g topically 4 (four) times daily. 200 g 2  . fluticasone (FLONASE) 50 MCG/ACT nasal spray Place 2 sprays into both nostrils daily. 16 g 11  . gabapentin (NEURONTIN) 100 MG capsule Take 2 capsules (200 mg total) by mouth at bedtime. 60 capsule 3  . halobetasol (ULTRAVATE) 0.05 % cream Apply topically 2 (two) times daily. 100 g 2  . pantoprazole (PROTONIX) 40 MG tablet Take 1 tablet (40 mg total) by mouth daily. 90 tablet 3  . rosuvastatin (CRESTOR) 40 MG tablet Take 1 tablet (40 mg total) by mouth daily. 90 tablet 3  . vitamin B-12 (CYANOCOBALAMIN) 1000 MCG tablet Take 1 tablet (1,000 mcg total) by mouth daily. 100 tablet 3  . loratadine (CLARITIN) 10 MG tablet Take 1 tablet (10 mg total) by mouth daily. 100 tablet 3  . tadalafil (CIALIS) 20 MG tablet Take 1 tablet (20 mg total) by mouth daily as needed for erectile dysfunction. 15 tablet 6   Facility-Administered Medications Prior to Visit  Medication Dose Route Frequency Provider Last Rate Last Dose  . 0.9 %  sodium chloride infusion  500 mL Intravenous Continuous Ladene Artist, MD        ROS Review of Systems  Constitutional: Negative for appetite change, fatigue and unexpected weight change.  HENT: Negative for congestion, nosebleeds, sneezing,  sore throat and trouble swallowing.   Eyes: Negative for itching and visual disturbance.  Respiratory: Negative for cough.   Cardiovascular: Negative for chest pain, palpitations and leg swelling.  Gastrointestinal: Negative for abdominal distention, blood in stool, diarrhea and nausea.  Genitourinary: Negative for frequency and hematuria.  Musculoskeletal: Negative for back pain, gait problem, joint swelling and neck pain.  Skin: Negative for rash.  Neurological: Negative for dizziness, tremors, speech difficulty and weakness.  Psychiatric/Behavioral: Negative for agitation, dysphoric mood and sleep disturbance. The patient is not nervous/anxious.     Objective:  BP 122/76 (BP Location: Left Arm, Patient Position: Sitting, Cuff Size: Large)   Pulse 89   Temp 99 F (37.2 C) (Oral)   Ht 6' (1.829 m)   Wt 259 lb (117.5 kg)   SpO2 100%   BMI 35.13 kg/m   BP Readings from Last 3 Encounters:  10/04/17 122/76  04/02/17 118/72  01/18/17 120/76    Wt Readings from Last 3 Encounters:  10/04/17 259 lb (117.5 kg)  04/02/17 255 lb (115.7 kg)  01/18/17 262 lb 0.6 oz (118.9 kg)    Physical Exam  Constitutional: He is oriented to person, place, and time. He appears well-developed. No distress.  NAD  HENT:  Mouth/Throat: Oropharynx is clear and moist.  Eyes: Conjunctivae are normal. Pupils are  equal, round, and reactive to light.  Neck: Normal range of motion. No JVD present. No thyromegaly present.  Cardiovascular: Normal rate, regular rhythm, normal heart sounds and intact distal pulses. Exam reveals no gallop and no friction rub.  No murmur heard. Pulmonary/Chest: Effort normal and breath sounds normal. No respiratory distress. He has no wheezes. He has no rales. He exhibits no tenderness.  Abdominal: Soft. Bowel sounds are normal. He exhibits no distension and no mass. There is no tenderness. There is no rebound and no guarding.  Musculoskeletal: Normal range of motion. He exhibits  no edema or tenderness.  Lymphadenopathy:    He has no cervical adenopathy.  Neurological: He is alert and oriented to person, place, and time. He has normal reflexes. No cranial nerve deficit. He exhibits normal muscle tone. He displays a negative Romberg sign. Coordination and gait normal.  Skin: Skin is warm and dry. No rash noted.  Psychiatric: He has a normal mood and affect. His behavior is normal. Judgment and thought content normal.  obese Lower legs lesions  Procedure: skin lesion steroid injection Reason: skin lesion, symptomatic - lichen Verbal consent was obtained.  Skin was cleaned with betadine and alcohol.  RLE lesion was injected with 10 mg of DepoMedrol and 0.5 cc 1% Lidocaine x4.  Tolerated well Complications - minor bleeding Band-aids applied   Lab Results  Component Value Date   WBC 6.3 03/29/2017   HGB 15.3 03/29/2017   HCT 44.6 03/29/2017   PLT 136.0 (L) 03/29/2017   GLUCOSE 120 (H) 03/29/2017   CHOL 185 03/29/2017   TRIG 142.0 03/29/2017   HDL 51.10 03/29/2017   LDLDIRECT 146.2 10/07/2010   LDLCALC 106 (H) 03/29/2017   ALT 65 (H) 03/29/2017   AST 44 (H) 03/29/2017   NA 138 03/29/2017   K 4.7 03/29/2017   CL 101 03/29/2017   CREATININE 1.09 03/29/2017   BUN 18 03/29/2017   CO2 28 03/29/2017   TSH 3.65 03/29/2017   PSA 0.58 03/29/2017   INR 1.02 08/31/2014   HGBA1C 6.0 03/29/2017    Dg Cervical Spine Complete  Result Date: 06/20/2016 CLINICAL DATA:  Neck pain and left shoulder pain EXAM: CERVICAL SPINE - COMPLETE 4+ VIEW COMPARISON:  None. FINDINGS: Normal alignment with straightening of the cervical lordosis. Disc degeneration and spurring most prominent C5-6 and C6-7. Mild right foraminal narrowing at C3-4 due to spurring. Mild right foraminal narrowing C5-6 and C6-7. Mild left foraminal narrowing C5-6 and C6-7 due to spurring. Negative for fracture or mass.  Soft tissues normal. IMPRESSION: Cervical spondylosis with mild foraminal narrowing  bilaterally due to spurring. Electronically Signed   By: Franchot Gallo M.D.   On: 06/20/2016 09:41    Assessment & Plan:   There are no diagnoses linked to this encounter. I am having Zachary Valdez maintain his vitamin B-12, loratadine, diclofenac sodium, aspirin EC, gabapentin, diclofenac, fluticasone, tadalafil, rosuvastatin, pantoprazole, Azilsartan Medoxomil, halobetasol, and diclofenac. We will continue to administer sodium chloride.  No orders of the defined types were placed in this encounter.    Follow-up: No Follow-up on file.  Walker Kehr, MD

## 2017-10-04 NOTE — Assessment & Plan Note (Signed)
See injections

## 2017-10-04 NOTE — Assessment & Plan Note (Addendum)
Lichen planus - LEs Had steroid injections, cream F/u w/Dr Elvera Lennox Use Sebamed brand soap, skin lotion

## 2017-10-04 NOTE — Assessment & Plan Note (Signed)
Protonix.  ?

## 2017-10-04 NOTE — Assessment & Plan Note (Signed)
On Edarbi

## 2017-10-05 ENCOUNTER — Other Ambulatory Visit (INDEPENDENT_AMBULATORY_CARE_PROVIDER_SITE_OTHER): Payer: 59

## 2017-10-05 DIAGNOSIS — E538 Deficiency of other specified B group vitamins: Secondary | ICD-10-CM | POA: Diagnosis not present

## 2017-10-05 DIAGNOSIS — I10 Essential (primary) hypertension: Secondary | ICD-10-CM | POA: Diagnosis not present

## 2017-10-05 DIAGNOSIS — L309 Dermatitis, unspecified: Secondary | ICD-10-CM | POA: Diagnosis not present

## 2017-10-05 DIAGNOSIS — N528 Other male erectile dysfunction: Secondary | ICD-10-CM

## 2017-10-05 LAB — BASIC METABOLIC PANEL
BUN: 24 mg/dL — AB (ref 6–23)
CALCIUM: 9.1 mg/dL (ref 8.4–10.5)
CO2: 27 meq/L (ref 19–32)
Chloride: 104 mEq/L (ref 96–112)
Creatinine, Ser: 1.12 mg/dL (ref 0.40–1.50)
GFR: 70.01 mL/min (ref >60.00)
GLUCOSE: 119 mg/dL — AB (ref 70–99)
Potassium: 4.7 mEq/L (ref 3.5–5.1)
Sodium: 139 mEq/L (ref 135–145)

## 2017-10-05 LAB — HEPATIC FUNCTION PANEL
ALK PHOS: 43 U/L (ref 39–117)
ALT: 51 U/L (ref 0–53)
AST: 41 U/L — AB (ref 0–37)
Albumin: 4.2 g/dL (ref 3.5–5.2)
BILIRUBIN DIRECT: 0.2 mg/dL (ref 0.0–0.3)
Total Bilirubin: 0.5 mg/dL (ref 0.2–1.2)
Total Protein: 7.1 g/dL (ref 6.0–8.3)

## 2017-10-05 LAB — HEMOGLOBIN A1C: Hgb A1c MFr Bld: 6 % (ref 4.6–6.5)

## 2017-12-07 ENCOUNTER — Encounter: Payer: Self-pay | Admitting: Internal Medicine

## 2017-12-07 ENCOUNTER — Ambulatory Visit: Payer: 59 | Admitting: Internal Medicine

## 2017-12-07 VITALS — BP 122/74 | HR 85 | Temp 97.8°F | Ht 72.0 in | Wt 260.0 lb

## 2017-12-07 DIAGNOSIS — L439 Lichen planus, unspecified: Secondary | ICD-10-CM | POA: Diagnosis not present

## 2017-12-07 DIAGNOSIS — I10 Essential (primary) hypertension: Secondary | ICD-10-CM

## 2017-12-07 DIAGNOSIS — R7309 Other abnormal glucose: Secondary | ICD-10-CM | POA: Diagnosis not present

## 2017-12-07 DIAGNOSIS — L309 Dermatitis, unspecified: Secondary | ICD-10-CM

## 2017-12-07 DIAGNOSIS — E538 Deficiency of other specified B group vitamins: Secondary | ICD-10-CM

## 2017-12-07 DIAGNOSIS — N528 Other male erectile dysfunction: Secondary | ICD-10-CM | POA: Diagnosis not present

## 2017-12-07 DIAGNOSIS — R635 Abnormal weight gain: Secondary | ICD-10-CM

## 2017-12-07 MED ORDER — METHYLPREDNISOLONE ACETATE 80 MG/ML IJ SUSP
80.0000 mg | Freq: Once | INTRAMUSCULAR | Status: AC
  Administered 2017-12-07: 80 mg via INTRALESIONAL

## 2017-12-07 NOTE — Assessment & Plan Note (Signed)
Labs

## 2017-12-07 NOTE — Assessment & Plan Note (Signed)
Discussed  Wt Readings from Last 3 Encounters:  12/07/17 260 lb (117.9 kg)  10/04/17 259 lb (117.5 kg)  04/02/17 255 lb (115.7 kg)

## 2017-12-07 NOTE — Addendum Note (Signed)
Addended by: Karren Cobble on: 12/07/2017 04:33 PM   Modules accepted: Orders

## 2017-12-07 NOTE — Assessment & Plan Note (Signed)
On Edarbi

## 2017-12-07 NOTE — Assessment & Plan Note (Signed)
See procedure  x8

## 2017-12-07 NOTE — Assessment & Plan Note (Addendum)
Cialis Labs See Instructions

## 2017-12-07 NOTE — Progress Notes (Signed)
Subjective:  Patient ID: Zachary Valdez, male    DOB: 09/05/1951  Age: 67 y.o. MRN: [id number]  CC: No chief complaint on file.   HPI Zachary Valdez presents for ED - worse, rash, obesity f/u    Outpatient Medications Prior to Visit  Medication Sig Dispense Refill  . aspirin EC 81 MG tablet Take 1 tablet (81 mg total) by mouth daily. 100 tablet 3  . Azilsartan Medoxomil (EDARBI) 80 MG TABS Take 1 tablet (80 mg total) by mouth daily. 90 tablet 3  . diclofenac (VOLTAREN) 50 MG EC tablet Take 1 tablet by mouth 2 (two) times daily as needed.    . diclofenac (VOLTAREN) 50 MG EC tablet TAKE 1 TABLET BY MOUTH TWICE A DAY AS NEEDED 60 tablet 2  . diclofenac sodium (VOLTAREN) 1 % GEL Apply 4 g topically 4 (four) times daily. 200 g 2  . fluticasone (FLONASE) 50 MCG/ACT nasal spray Place 2 sprays into both nostrils daily. 16 g 11  . gabapentin (NEURONTIN) 100 MG capsule Take 2 capsules (200 mg total) by mouth at bedtime. 60 capsule 3  . halobetasol (ULTRAVATE) 0.05 % cream Apply topically 2 (two) times daily. 100 g 2  . pantoprazole (PROTONIX) 40 MG tablet Take 1 tablet (40 mg total) by mouth daily. 90 tablet 3  . rosuvastatin (CRESTOR) 40 MG tablet Take 1 tablet (40 mg total) by mouth daily. 90 tablet 3  . vitamin B-12 (CYANOCOBALAMIN) 1000 MCG tablet Take 1 tablet (1,000 mcg total) by mouth daily. 100 tablet 3  . loratadine (CLARITIN) 10 MG tablet Take 1 tablet (10 mg total) by mouth daily. 100 tablet 3  . tadalafil (CIALIS) 20 MG tablet Take 1 tablet (20 mg total) by mouth daily as needed for erectile dysfunction. 15 tablet 6   No facility-administered medications prior to visit.     ROS Review of Systems  Constitutional: Negative for appetite change, fatigue and unexpected weight change.  HENT: Negative for congestion, nosebleeds, sneezing, sore throat and trouble swallowing.   Eyes: Negative for itching and visual disturbance.  Respiratory: Negative for cough.   Cardiovascular: Negative  for chest pain, palpitations and leg swelling.  Gastrointestinal: Negative for abdominal distention, blood in stool, diarrhea and nausea.  Genitourinary: Negative for frequency and hematuria.  Musculoskeletal: Negative for gait problem, joint swelling and neck pain.  Skin: Positive for rash.  Neurological: Negative for dizziness, tremors, speech difficulty and weakness.  Psychiatric/Behavioral: Negative for agitation, dysphoric mood and sleep disturbance. The patient is not nervous/anxious.     Objective:  BP 122/74 (BP Location: Left Arm, Patient Position: Sitting, Cuff Size: Large)   Pulse 85   Temp 97.8 F (36.6 C) (Oral)   Ht 6' (1.829 m)   Wt 260 lb (117.9 kg)   SpO2 98%   BMI 35.26 kg/m   BP Readings from Last 3 Encounters:  12/07/17 122/74  10/04/17 122/76  04/02/17 118/72    Wt Readings from Last 3 Encounters:  12/07/17 260 lb (117.9 kg)  10/04/17 259 lb (117.5 kg)  04/02/17 255 lb (115.7 kg)    Physical Exam  Constitutional: He is oriented to person, place, and time. He appears well-developed. No distress.  NAD  HENT:  Mouth/Throat: Oropharynx is clear and moist.  Eyes: Conjunctivae are normal. Pupils are equal, round, and reactive to light.  Neck: Normal range of motion. No JVD present. No thyromegaly present.  Cardiovascular: Normal rate, regular rhythm, normal heart sounds and intact distal pulses. Exam  reveals no gallop and no friction rub.  No murmur heard. Pulmonary/Chest: Effort normal and breath sounds normal. No respiratory distress. He has no wheezes. He has no rales. He exhibits no tenderness.  Abdominal: Soft. Bowel sounds are normal. He exhibits no distension and no mass. There is no tenderness. There is no rebound and no guarding.  Musculoskeletal: Normal range of motion. He exhibits no edema or tenderness.  Lymphadenopathy:    He has no cervical adenopathy.  Neurological: He is alert and oriented to person, place, and time. He has normal reflexes.  No cranial nerve deficit. He exhibits normal muscle tone. He displays a negative Romberg sign. Coordination and gait normal.  Skin: Skin is warm and dry. Rash noted.  Psychiatric: He has a normal mood and affect. His behavior is normal. Judgment and thought content normal.    Procedure: skin lesion steroid injection Reason: skin lesion, symptomatic - lichen Verbal consent was obtained.  Skin was cleaned with betadine and alcohol.  RLE lesion was injected with 10 mg of DepoMedrol and 0.5 cc 1% Lidocaine x8.  Tolerated well Complications - minor bleeding Band-aids applied   Lab Results  Component Value Date   WBC 6.3 03/29/2017   HGB 15.3 03/29/2017   HCT 44.6 03/29/2017   PLT 136.0 (L) 03/29/2017   GLUCOSE 119 (H) 10/05/2017   CHOL 185 03/29/2017   TRIG 142.0 03/29/2017   HDL 51.10 03/29/2017   LDLDIRECT 146.2 10/07/2010   LDLCALC 106 (H) 03/29/2017   ALT 51 10/05/2017   AST 41 (H) 10/05/2017   NA 139 10/05/2017   K 4.7 10/05/2017   CL 104 10/05/2017   CREATININE 1.12 10/05/2017   BUN 24 (H) 10/05/2017   CO2 27 10/05/2017   TSH 3.65 03/29/2017   PSA 0.58 03/29/2017   INR 1.02 08/31/2014   HGBA1C 6.0 10/05/2017    Dg Cervical Spine Complete  Result Date: 06/20/2016 CLINICAL DATA:  Neck pain and left shoulder pain EXAM: CERVICAL SPINE - COMPLETE 4+ VIEW COMPARISON:  None. FINDINGS: Normal alignment with straightening of the cervical lordosis. Disc degeneration and spurring most prominent C5-6 and C6-7. Mild right foraminal narrowing at C3-4 due to spurring. Mild right foraminal narrowing C5-6 and C6-7. Mild left foraminal narrowing C5-6 and C6-7 due to spurring. Negative for fracture or mass.  Soft tissues normal. IMPRESSION: Cervical spondylosis with mild foraminal narrowing bilaterally due to spurring. Electronically Signed   By: Franchot Gallo M.D.   On: 06/20/2016 09:41    Assessment & Plan:   There are no diagnoses linked to this encounter. I am having Zachary C.  Valdez maintain his vitamin B-12, loratadine, diclofenac sodium, aspirin EC, gabapentin, diclofenac, fluticasone, tadalafil, rosuvastatin, pantoprazole, Azilsartan Medoxomil, halobetasol, and diclofenac.  No orders of the defined types were placed in this encounter.    Follow-up: No Follow-up on file.  Walker Kehr, MD

## 2017-12-07 NOTE — Patient Instructions (Signed)

## 2017-12-07 NOTE — Assessment & Plan Note (Signed)
On B12 

## 2017-12-11 ENCOUNTER — Other Ambulatory Visit: Payer: Self-pay | Admitting: Internal Medicine

## 2018-02-01 ENCOUNTER — Encounter: Payer: Self-pay | Admitting: Internal Medicine

## 2018-02-01 ENCOUNTER — Other Ambulatory Visit: Payer: Self-pay | Admitting: Internal Medicine

## 2018-02-01 MED ORDER — VALSARTAN 320 MG PO TABS: 320.0000 mg | ORAL_TABLET | Freq: Every day | ORAL | 3 refills | Status: DC

## 2018-02-07 ENCOUNTER — Other Ambulatory Visit: Payer: Self-pay | Admitting: Internal Medicine

## 2018-02-21 ENCOUNTER — Encounter: Payer: Self-pay | Admitting: Internal Medicine

## 2018-02-22 ENCOUNTER — Telehealth: Payer: Self-pay | Admitting: Internal Medicine

## 2018-02-22 NOTE — Telephone Encounter (Signed)
Mychart msg has been sent to MD waiting on his response.Marland KitchenJohny Chess

## 2018-02-22 NOTE — Telephone Encounter (Signed)
Copied from [place] [id number]. Topic: Quick Communication - See Telephone Encounter >> Feb 22, 2018  3:07 PM [name], NT wrote: CRM for notification. See Telephone encounter for: 02/22/18. Pt calling to check status on the mychart message he sent back yesterday to Dr. Alain Marion yesterday regarding Valsartan.

## 2018-02-23 ENCOUNTER — Other Ambulatory Visit: Payer: Self-pay | Admitting: Internal Medicine

## 2018-02-23 MED ORDER — CANDESARTAN CILEXETIL 32 MG PO TABS: 32.0000 mg | ORAL_TABLET | Freq: Every day | ORAL | 3 refills | Status: DC

## 2018-02-23 NOTE — Telephone Encounter (Signed)
Will check Thank you

## 2018-02-28 ENCOUNTER — Telehealth: Payer: Self-pay | Admitting: Internal Medicine

## 2018-02-28 NOTE — Telephone Encounter (Signed)
Patient states she has switched to [facility] and they are not wanting to cover edarbi.  States [employer] order is faxing over a PA to be processed.  Patient would like to know if this has to be signed off on if another MD could look at this while Dr. Alain Marion is out of the office.  States needs meds as soon as possible. States he has spoken with [employer] and they have stated that they would review for a decision within 24 hours of our office getting PA back to them. Please follow up with patient in regard.

## 2018-03-05 NOTE — Telephone Encounter (Signed)
Pt is calling checking on the status of the PA on edarbi

## 2018-03-06 ENCOUNTER — Encounter: Payer: Self-pay | Admitting: Internal Medicine

## 2018-03-07 ENCOUNTER — Other Ambulatory Visit: Payer: Self-pay

## 2018-03-07 MED ORDER — PANTOPRAZOLE SODIUM 40 MG PO TBEC: 40.0000 mg | DELAYED_RELEASE_TABLET | Freq: Every day | ORAL | 3 refills | Status: DC

## 2018-03-07 MED ORDER — ROSUVASTATIN CALCIUM 40 MG PO TABS: 40.0000 mg | ORAL_TABLET | Freq: Every day | ORAL | 3 refills | Status: DC

## 2018-03-07 MED ORDER — DICLOFENAC SODIUM 50 MG PO TBEC: 50.0000 mg | DELAYED_RELEASE_TABLET | Freq: Two times a day (BID) | ORAL | 2 refills | Status: DC | PRN

## 2018-03-07 NOTE — Telephone Encounter (Signed)
replied to pt via mychart

## 2018-03-08 ENCOUNTER — Encounter: Payer: Self-pay | Admitting: Internal Medicine

## 2018-03-08 NOTE — Telephone Encounter (Signed)
PA for medication was denied, coved medications are lisinopril tab, ramipril cap, banazepril tab, and quinapril tab.   Please advise

## 2018-03-12 ENCOUNTER — Encounter: Payer: Self-pay | Admitting: Internal Medicine

## 2018-03-12 ENCOUNTER — Ambulatory Visit (INDEPENDENT_AMBULATORY_CARE_PROVIDER_SITE_OTHER): Payer: Medicare HMO | Admitting: Internal Medicine

## 2018-03-12 VITALS — BP 130/76 | HR 74 | Temp 98.8°F | Ht 72.0 in | Wt 260.0 lb

## 2018-03-12 DIAGNOSIS — E538 Deficiency of other specified B group vitamins: Secondary | ICD-10-CM | POA: Diagnosis not present

## 2018-03-12 DIAGNOSIS — N528 Other male erectile dysfunction: Secondary | ICD-10-CM

## 2018-03-12 DIAGNOSIS — I10 Essential (primary) hypertension: Secondary | ICD-10-CM | POA: Diagnosis not present

## 2018-03-12 DIAGNOSIS — Z0000 Encounter for general adult medical examination without abnormal findings: Secondary | ICD-10-CM

## 2018-03-12 MED ORDER — HALOBETASOL PROPIONATE 0.05 % EX CREA: TOPICAL_CREAM | Freq: Two times a day (BID) | CUTANEOUS | 2 refills | Status: DC

## 2018-03-12 MED ORDER — TADALAFIL 20 MG PO TABS: 20.0000 mg | ORAL_TABLET | Freq: Every day | ORAL | 3 refills | Status: DC | PRN

## 2018-03-12 NOTE — Assessment & Plan Note (Signed)
Zachary Valdez seems to be well tolerated - will do PA

## 2018-03-12 NOTE — Assessment & Plan Note (Signed)
Cialis prn 

## 2018-03-12 NOTE — Progress Notes (Signed)
Subjective:  Patient ID: Zachary Valdez, male    DOB: 24-Oct-1950  Age: 67 y.o. MRN: [id number]  CC: No chief complaint on file.   HPI Zachary Valdez presents for HTN - off Edarbi due to a need for a PA again F/u rash F/u ED   Outpatient Medications Prior to Visit  Medication Sig Dispense Refill  . aspirin EC 81 MG tablet Take 1 tablet (81 mg total) by mouth daily. 100 tablet 3  . candesartan (ATACAND) 32 MG tablet Take 1 tablet (32 mg total) by mouth daily. 90 tablet 3  . diclofenac (VOLTAREN) 50 MG EC tablet Take 1 tablet (50 mg total) by mouth 2 (two) times daily as needed. 180 tablet 2  . EDARBI 80 MG TABS TAKE ONE TABLET BY MOUTH DAILY 30 tablet 5  . fluticasone (FLONASE) 50 MCG/ACT nasal spray Place 2 sprays into both nostrils daily. 16 g 11  . halobetasol (ULTRAVATE) 0.05 % cream Apply topically 2 (two) times daily. 100 g 2  . pantoprazole (PROTONIX) 40 MG tablet Take 1 tablet (40 mg total) by mouth daily. 90 tablet 3  . rosuvastatin (CRESTOR) 40 MG tablet Take 1 tablet (40 mg total) by mouth daily. 90 tablet 3  . vitamin B-12 (CYANOCOBALAMIN) 1000 MCG tablet Take 1 tablet (1,000 mcg total) by mouth daily. 100 tablet 3  . VOLTAREN 1 % GEL APPLY 4 GRAMS TOPICALLY 4 TIMES A DAY AS DIRECTED 200 g 1  . loratadine (CLARITIN) 10 MG tablet Take 1 tablet (10 mg total) by mouth daily. 100 tablet 3  . tadalafil (CIALIS) 20 MG tablet Take 1 tablet (20 mg total) by mouth daily as needed for erectile dysfunction. 15 tablet 6   No facility-administered medications prior to visit.     ROS: Review of Systems  Constitutional: Negative for appetite change, fatigue and unexpected weight change.  HENT: Negative for congestion, nosebleeds, sneezing, sore throat and trouble swallowing.   Eyes: Negative for itching and visual disturbance.  Respiratory: Negative for cough.   Cardiovascular: Negative for chest pain, palpitations and leg swelling.  Gastrointestinal: Negative for abdominal distention,  blood in stool, diarrhea and nausea.  Genitourinary: Negative for frequency and hematuria.  Musculoskeletal: Negative for back pain, gait problem, joint swelling and neck pain.  Skin: Positive for rash.  Neurological: Negative for dizziness, tremors, speech difficulty and weakness.  Psychiatric/Behavioral: Negative for agitation, dysphoric mood and sleep disturbance. The patient is not nervous/anxious.     Objective:  BP 130/76 (BP Location: Left Arm, Patient Position: Sitting, Cuff Size: Large)   Pulse 74   Temp 98.8 F (37.1 C) (Oral)   Ht 6' (1.829 m)   Wt 260 lb (117.9 kg)   SpO2 98%   BMI 35.26 kg/m   BP Readings from Last 3 Encounters:  03/12/18 130/76  12/07/17 122/74  10/04/17 122/76    Wt Readings from Last 3 Encounters:  03/12/18 260 lb (117.9 kg)  12/07/17 260 lb (117.9 kg)  10/04/17 259 lb (117.5 kg)    Physical Exam  Constitutional: He is oriented to person, place, and time. He appears well-developed. No distress.  NAD  HENT:  Mouth/Throat: Oropharynx is clear and moist.  Eyes: Pupils are equal, round, and reactive to light. Conjunctivae are normal.  Neck: Normal range of motion. No JVD present. No thyromegaly present.  Cardiovascular: Normal rate, regular rhythm, normal heart sounds and intact distal pulses. Exam reveals no gallop and no friction rub.  No murmur heard. Pulmonary/Chest:  Effort normal and breath sounds normal. No respiratory distress. He has no wheezes. He has no rales. He exhibits no tenderness.  Abdominal: Soft. Bowel sounds are normal. He exhibits no distension and no mass. There is no tenderness. There is no rebound and no guarding.  Musculoskeletal: Normal range of motion. He exhibits no edema or tenderness.  Lymphadenopathy:    He has no cervical adenopathy.  Neurological: He is alert and oriented to person, place, and time. He has normal reflexes. No cranial nerve deficit. He exhibits normal muscle tone. He displays a negative Romberg  sign. Coordination and gait normal.  Skin: Skin is warm and dry. No rash noted.  Psychiatric: He has a normal mood and affect. His behavior is normal. Judgment and thought content normal.  obese Healing rash on B ankles  Lab Results  Component Value Date   WBC 6.3 03/29/2017   HGB 15.3 03/29/2017   HCT 44.6 03/29/2017   PLT 136.0 (L) 03/29/2017   GLUCOSE 119 (H) 10/05/2017   CHOL 185 03/29/2017   TRIG 142.0 03/29/2017   HDL 51.10 03/29/2017   LDLDIRECT 146.2 10/07/2010   LDLCALC 106 (H) 03/29/2017   ALT 51 10/05/2017   AST 41 (H) 10/05/2017   NA 139 10/05/2017   K 4.7 10/05/2017   CL 104 10/05/2017   CREATININE 1.12 10/05/2017   BUN 24 (H) 10/05/2017   CO2 27 10/05/2017   TSH 3.65 03/29/2017   PSA 0.58 03/29/2017   INR 1.02 08/31/2014   HGBA1C 6.0 10/05/2017    Dg Cervical Spine Complete  Result Date: 06/20/2016 CLINICAL DATA:  Neck pain and left shoulder pain EXAM: CERVICAL SPINE - COMPLETE 4+ VIEW COMPARISON:  None. FINDINGS: Normal alignment with straightening of the cervical lordosis. Disc degeneration and spurring most prominent C5-6 and C6-7. Mild right foraminal narrowing at C3-4 due to spurring. Mild right foraminal narrowing C5-6 and C6-7. Mild left foraminal narrowing C5-6 and C6-7 due to spurring. Negative for fracture or mass.  Soft tissues normal. IMPRESSION: Cervical spondylosis with mild foraminal narrowing bilaterally due to spurring. Electronically Signed   By: Franchot Gallo M.D.   On: 06/20/2016 09:41    Assessment & Plan:   There are no diagnoses linked to this encounter.   No orders of the defined types were placed in this encounter.    Follow-up: No follow-ups on file.  Walker Kehr, MD

## 2018-03-12 NOTE — Assessment & Plan Note (Signed)
On B12 

## 2018-03-14 ENCOUNTER — Telehealth: Payer: Self-pay | Admitting: *Deleted

## 2018-03-14 NOTE — Telephone Encounter (Signed)
Halobetasol cream is $138 OOP for pt. Pt is requesting something cheaper. Please advise.

## 2018-03-17 MED ORDER — TRIAMCINOLONE ACETONIDE 0.1 % EX CREA: 1.0000 "application " | TOPICAL_CREAM | Freq: Two times a day (BID) | CUTANEOUS | 3 refills | Status: DC

## 2018-03-17 NOTE — Telephone Encounter (Signed)
I will email a prescription for triamcinolone cream.  Thank you

## 2018-03-18 NOTE — Telephone Encounter (Signed)
Left detailed mess informing pt of below.  

## 2018-03-29 ENCOUNTER — Other Ambulatory Visit (INDEPENDENT_AMBULATORY_CARE_PROVIDER_SITE_OTHER): Payer: Medicare HMO

## 2018-03-29 DIAGNOSIS — Z0000 Encounter for general adult medical examination without abnormal findings: Secondary | ICD-10-CM

## 2018-03-29 DIAGNOSIS — N528 Other male erectile dysfunction: Secondary | ICD-10-CM | POA: Diagnosis not present

## 2018-03-29 DIAGNOSIS — I10 Essential (primary) hypertension: Secondary | ICD-10-CM

## 2018-03-29 LAB — CBC WITH DIFFERENTIAL/PLATELET
BASOS PCT: 0.3 % (ref 0.0–3.0)
Basophils Absolute: 0 10*3/uL (ref 0.0–0.1)
EOS ABS: 0.2 10*3/uL (ref 0.0–0.7)
EOS PCT: 3.4 % (ref 0.0–5.0)
HEMATOCRIT: 45.7 % (ref 39.0–52.0)
Hemoglobin: 15.6 g/dL (ref 13.0–17.0)
LYMPHS PCT: 27.1 % (ref 12.0–46.0)
Lymphs Abs: 1.7 10*3/uL (ref 0.7–4.0)
MCHC: 34.2 g/dL (ref 30.0–36.0)
MCV: 106.4 fl — ABNORMAL HIGH (ref 78.0–100.0)
MONO ABS: 0.8 10*3/uL (ref 0.1–1.0)
Monocytes Relative: 12.6 % — ABNORMAL HIGH (ref 3.0–12.0)
Neutro Abs: 3.5 10*3/uL (ref 1.4–7.7)
Neutrophils Relative %: 56.6 % (ref 43.0–77.0)
Platelets: 164 10*3/uL (ref 150.0–400.0)
RBC: 4.29 Mil/uL (ref 4.22–5.81)
RDW: 12.5 % (ref 11.5–15.5)
WBC: 6.2 10*3/uL (ref 4.0–10.5)

## 2018-03-29 LAB — URINALYSIS
Bilirubin Urine: NEGATIVE
HGB URINE DIPSTICK: NEGATIVE
Ketones, ur: NEGATIVE
Leukocytes, UA: NEGATIVE
NITRITE: NEGATIVE
SPECIFIC GRAVITY, URINE: 1.025 (ref 1.000–1.030)
Total Protein, Urine: NEGATIVE
UROBILINOGEN UA: 0.2 (ref 0.0–1.0)
Urine Glucose: NEGATIVE
pH: 5.5 (ref 5.0–8.0)

## 2018-03-29 LAB — LIPID PANEL
CHOLESTEROL: 181 mg/dL (ref 0–200)
HDL: 45.2 mg/dL (ref >39.00)
LDL CALC: 113 mg/dL — AB (ref 0–99)
NONHDL: 135.48
Total CHOL/HDL Ratio: 4
Triglycerides: 111 mg/dL (ref 0.0–149.0)
VLDL: 22.2 mg/dL (ref 0.0–40.0)

## 2018-03-29 LAB — BASIC METABOLIC PANEL
BUN: 19 mg/dL (ref 6–23)
CO2: 26 mEq/L (ref 19–32)
CREATININE: 0.95 mg/dL (ref 0.40–1.50)
Calcium: 9.3 mg/dL (ref 8.4–10.5)
Chloride: 102 mEq/L (ref 96–112)
GFR: 84.53 mL/min (ref >60.00)
Glucose, Bld: 109 mg/dL — ABNORMAL HIGH (ref 70–99)
POTASSIUM: 4.5 meq/L (ref 3.5–5.1)
Sodium: 137 mEq/L (ref 135–145)

## 2018-03-29 LAB — HEPATIC FUNCTION PANEL
ALK PHOS: 43 U/L (ref 39–117)
ALT: 58 U/L — AB (ref 0–53)
AST: 35 U/L (ref 0–37)
Albumin: 4.2 g/dL (ref 3.5–5.2)
BILIRUBIN DIRECT: 0.2 mg/dL (ref 0.0–0.3)
TOTAL PROTEIN: 6.7 g/dL (ref 6.0–8.3)
Total Bilirubin: 0.7 mg/dL (ref 0.2–1.2)

## 2018-03-29 LAB — TESTOSTERONE: TESTOSTERONE: 310.34 ng/dL (ref 300.00–890.00)

## 2018-03-29 LAB — TSH: TSH: 2.98 u[IU]/mL (ref 0.35–4.50)

## 2018-03-29 LAB — PSA: PSA: 0.59 ng/mL (ref 0.10–4.00)

## 2018-04-03 ENCOUNTER — Ambulatory Visit (INDEPENDENT_AMBULATORY_CARE_PROVIDER_SITE_OTHER): Payer: Medicare HMO | Admitting: Internal Medicine

## 2018-04-03 ENCOUNTER — Encounter: Payer: Self-pay | Admitting: Internal Medicine

## 2018-04-03 VITALS — BP 130/78 | HR 94 | Ht 72.0 in | Wt 258.0 lb

## 2018-04-03 DIAGNOSIS — I10 Essential (primary) hypertension: Secondary | ICD-10-CM

## 2018-04-03 DIAGNOSIS — Z0000 Encounter for general adult medical examination without abnormal findings: Secondary | ICD-10-CM

## 2018-04-03 DIAGNOSIS — L309 Dermatitis, unspecified: Secondary | ICD-10-CM

## 2018-04-03 DIAGNOSIS — R635 Abnormal weight gain: Secondary | ICD-10-CM

## 2018-04-03 DIAGNOSIS — Z0001 Encounter for general adult medical examination with abnormal findings: Secondary | ICD-10-CM

## 2018-04-03 MED ORDER — OLMESARTAN MEDOXOMIL 40 MG PO TABS: 40.0000 mg | ORAL_TABLET | Freq: Every day | ORAL | 3 refills | Status: DC

## 2018-04-03 NOTE — Progress Notes (Signed)
Subjective:  Patient ID: Zachary Valdez, male    DOB: Feb 14, 1951  Age: 67 y.o. MRN: [id number]  CC: No chief complaint on file.   HPI Zachary Valdez presents for a well exam. On Edarbi samples... F/u HTN C/o rash C/o R shoulder pain  Outpatient Medications Prior to Visit  Medication Sig Dispense Refill  . aspirin EC 81 MG tablet Take 1 tablet (81 mg total) by mouth daily. 100 tablet 3  . diclofenac (VOLTAREN) 50 MG EC tablet Take 1 tablet (50 mg total) by mouth 2 (two) times daily as needed. 180 tablet 2  . EDARBI 80 MG TABS TAKE ONE TABLET BY MOUTH DAILY 30 tablet 5  . fluticasone (FLONASE) 50 MCG/ACT nasal spray Place 2 sprays into both nostrils daily. 16 g 11  . pantoprazole (PROTONIX) 40 MG tablet Take 1 tablet (40 mg total) by mouth daily. 90 tablet 3  . rosuvastatin (CRESTOR) 40 MG tablet Take 1 tablet (40 mg total) by mouth daily. 90 tablet 3  . tadalafil (CIALIS) 20 MG tablet Take 1 tablet (20 mg total) by mouth daily as needed for erectile dysfunction. 30 tablet 3  . triamcinolone cream (KENALOG) 0.1 % Apply 1 application topically 2 (two) times daily. 45 g 3  . vitamin B-12 (CYANOCOBALAMIN) 1000 MCG tablet Take 1 tablet (1,000 mcg total) by mouth daily. 100 tablet 3  . VOLTAREN 1 % GEL APPLY 4 GRAMS TOPICALLY 4 TIMES A DAY AS DIRECTED 200 g 1  . loratadine (CLARITIN) 10 MG tablet Take 1 tablet (10 mg total) by mouth daily. 100 tablet 3   No facility-administered medications prior to visit.     ROS: Review of Systems  Constitutional: Negative for appetite change, fatigue and unexpected weight change.  HENT: Negative for congestion, nosebleeds, sneezing, sore throat and trouble swallowing.   Eyes: Negative for itching and visual disturbance.  Respiratory: Negative for cough.   Cardiovascular: Negative for chest pain, palpitations and leg swelling.  Gastrointestinal: Negative for abdominal distention, blood in stool, diarrhea and nausea.  Genitourinary: Negative for  frequency and hematuria.  Musculoskeletal: Negative for back pain, gait problem, joint swelling and neck pain.  Skin: Positive for rash.  Neurological: Negative for dizziness, tremors, speech difficulty and weakness.  Psychiatric/Behavioral: Negative for agitation, dysphoric mood and sleep disturbance. The patient is not nervous/anxious.     Objective:  BP 130/78 (BP Location: Left Arm, Patient Position: Sitting, Cuff Size: Large)   Pulse 94   Ht 6' (1.829 m)   Wt 258 lb (117 kg)   SpO2 97%   BMI 34.99 kg/m   BP Readings from Last 3 Encounters:  04/03/18 130/78  03/12/18 130/76  12/07/17 122/74    Wt Readings from Last 3 Encounters:  04/03/18 258 lb (117 kg)  03/12/18 260 lb (117.9 kg)  12/07/17 260 lb (117.9 kg)    Physical Exam  Constitutional: He is oriented to person, place, and time. He appears well-developed and well-nourished. No distress.  HENT:  Head: Normocephalic and atraumatic.  Right Ear: External ear normal.  Left Ear: External ear normal.  Nose: Nose normal.  Mouth/Throat: Oropharynx is clear and moist. No oropharyngeal exudate.  Eyes: Pupils are equal, round, and reactive to light. Conjunctivae and EOM are normal. Right eye exhibits no discharge. Left eye exhibits no discharge. No scleral icterus.  Neck: Normal range of motion. Neck supple. No JVD present. No tracheal deviation present. No thyromegaly present.  Cardiovascular: Normal rate, regular rhythm, normal heart sounds  and intact distal pulses. Exam reveals no gallop and no friction rub.  No murmur heard. Pulmonary/Chest: Effort normal and breath sounds normal. No stridor. No respiratory distress. He has no wheezes. He has no rales. He exhibits no tenderness.  Abdominal: Soft. Bowel sounds are normal. He exhibits no distension and no mass. There is no tenderness. There is no rebound and no guarding.  Genitourinary: Rectum normal, prostate normal and penis normal. Rectal exam shows guaiac negative stool.  No penile tenderness.  Musculoskeletal: Normal range of motion. He exhibits no edema or tenderness.  Lymphadenopathy:    He has no cervical adenopathy.  Neurological: He is alert and oriented to person, place, and time. He has normal reflexes. He displays normal reflexes. No cranial nerve deficit. He exhibits normal muscle tone. Coordination normal.  Skin: Skin is warm and dry. Rash noted. He is not diaphoretic. No erythema. No pallor.  Psychiatric: He has a normal mood and affect. His behavior is normal. Judgment and thought content normal.   Procedure: skin lesion steroid injection Reason: skin lesion, symptomatic- lichen Verbal consent was obtained.  Skin was cleaned with betadine and alcohol. RLElesion was injected with 10 mg of DepoMedrol and 0.5 cc 1% Lidocainex8.  Tolerated well Complications- minor bleeding Band-aidsapplied   Lab Results  Component Value Date   WBC 6.2 03/29/2018   HGB 15.6 03/29/2018   HCT 45.7 03/29/2018   PLT 164.0 03/29/2018   GLUCOSE 109 (H) 03/29/2018   CHOL 181 03/29/2018   TRIG 111.0 03/29/2018   HDL 45.20 03/29/2018   LDLDIRECT 146.2 10/07/2010   LDLCALC 113 (H) 03/29/2018   ALT 58 (H) 03/29/2018   AST 35 03/29/2018   NA 137 03/29/2018   K 4.5 03/29/2018   CL 102 03/29/2018   CREATININE 0.95 03/29/2018   BUN 19 03/29/2018   CO2 26 03/29/2018   TSH 2.98 03/29/2018   PSA 0.59 03/29/2018   INR 1.02 08/31/2014   HGBA1C 6.0 10/05/2017    Dg Cervical Spine Complete  Result Date: 06/20/2016 CLINICAL DATA:  Neck pain and left shoulder pain EXAM: CERVICAL SPINE - COMPLETE 4+ VIEW COMPARISON:  None. FINDINGS: Normal alignment with straightening of the cervical lordosis. Disc degeneration and spurring most prominent C5-6 and C6-7. Mild right foraminal narrowing at C3-4 due to spurring. Mild right foraminal narrowing C5-6 and C6-7. Mild left foraminal narrowing C5-6 and C6-7 due to spurring. Negative for fracture or mass.  Soft tissues  normal. IMPRESSION: Cervical spondylosis with mild foraminal narrowing bilaterally due to spurring. Electronically Signed   By: Franchot Gallo M.D.   On: 06/20/2016 09:41    Assessment & Plan:   There are no diagnoses linked to this encounter.   No orders of the defined types were placed in this encounter.    Follow-up: No follow-ups on file.  Walker Kehr, MD

## 2018-04-03 NOTE — Assessment & Plan Note (Signed)
Better  

## 2018-04-03 NOTE — Patient Instructions (Signed)

## 2018-04-03 NOTE — Assessment & Plan Note (Signed)
Zachary Valdez - too $$$ Try Olmesartan 40 mg/d

## 2018-04-09 NOTE — Assessment & Plan Note (Signed)

## 2018-05-13 ENCOUNTER — Telehealth: Payer: Self-pay

## 2018-05-13 MED ORDER — METHYLPREDNISOLONE ACETATE 80 MG/ML IJ SUSP
80.0000 mg | Freq: Once | INTRAMUSCULAR | Status: AC
  Administered 2018-04-03: 80 mg via INTRALESIONAL

## 2018-05-13 NOTE — Telephone Encounter (Signed)
-----   Message from Maurie Boettcher sent at 05/13/2018  8:17 AM EDT ----- Regarding: Depo Medrol Hi Tamara,  This patient had Depo Medrol injection on 04/03/18.  However, the charge for the medication did not drop.  If not pt supplied, could this please be added?  Thanks for your help, Sharyn Lull

## 2018-05-13 NOTE — Addendum Note (Signed)
Addended by: Cresenciano Lick on: 05/13/2018 03:03 PM   Modules accepted: Orders

## 2018-05-13 NOTE — Addendum Note (Signed)
Addended by: Karren Cobble on: 05/13/2018 08:50 AM   Modules accepted: Orders

## 2018-05-23 DIAGNOSIS — G4730 Sleep apnea, unspecified: Secondary | ICD-10-CM

## 2018-06-06 ENCOUNTER — Other Ambulatory Visit: Payer: Self-pay | Admitting: Internal Medicine

## 2018-06-12 DIAGNOSIS — G4733 Obstructive sleep apnea (adult) (pediatric): Secondary | ICD-10-CM | POA: Diagnosis not present

## 2018-07-03 ENCOUNTER — Ambulatory Visit: Payer: Medicare HMO | Admitting: Internal Medicine

## 2018-07-05 ENCOUNTER — Ambulatory Visit: Payer: Medicare HMO | Admitting: Internal Medicine

## 2018-07-22 ENCOUNTER — Ambulatory Visit: Payer: Medicare HMO | Admitting: Internal Medicine

## 2018-08-28 ENCOUNTER — Encounter: Payer: Self-pay | Admitting: Internal Medicine

## 2018-08-28 ENCOUNTER — Ambulatory Visit (INDEPENDENT_AMBULATORY_CARE_PROVIDER_SITE_OTHER): Payer: Medicare HMO | Admitting: Internal Medicine

## 2018-08-28 ENCOUNTER — Encounter

## 2018-08-28 VITALS — BP 140/90 | HR 77 | Temp 98.4°F | Ht 72.0 in | Wt 263.0 lb

## 2018-08-28 DIAGNOSIS — R21 Rash and other nonspecific skin eruption: Secondary | ICD-10-CM

## 2018-08-28 DIAGNOSIS — E785 Hyperlipidemia, unspecified: Secondary | ICD-10-CM | POA: Diagnosis not present

## 2018-08-28 DIAGNOSIS — E538 Deficiency of other specified B group vitamins: Secondary | ICD-10-CM | POA: Diagnosis not present

## 2018-08-28 DIAGNOSIS — N528 Other male erectile dysfunction: Secondary | ICD-10-CM

## 2018-08-28 DIAGNOSIS — I10 Essential (primary) hypertension: Secondary | ICD-10-CM | POA: Diagnosis not present

## 2018-08-28 MED ORDER — ALPROSTADIL (VASODILATOR) 20 MCG IC KIT: 20.0000 ug | PACK | INTRACAVERNOUS | 6 refills | Status: DC | PRN

## 2018-08-28 MED ORDER — AZILSARTAN MEDOXOMIL 80 MG PO TABS: 1.0000 | ORAL_TABLET | Freq: Every day | ORAL | 3 refills | Status: DC

## 2018-08-28 NOTE — Assessment & Plan Note (Signed)
On B12 

## 2018-08-28 NOTE — Progress Notes (Signed)
Subjective:  Patient ID: Zachary Valdez, male    DOB: [birth year]  Age: 67 y.o. MRN: [id number]  CC: No chief complaint on file.   HPI Zachary Valdez presents for HTN - worse, dyslipidemia, rash - better f/u C/o ED - not better  Outpatient Medications Prior to Visit  Medication Sig Dispense Refill  . aspirin EC 81 MG tablet Take 1 tablet (81 mg total) by mouth daily. 100 tablet 3  . diclofenac (VOLTAREN) 50 MG EC tablet Take 1 tablet (50 mg total) by mouth 2 (two) times daily as needed. 180 tablet 2  . fluticasone (FLONASE) 50 MCG/ACT nasal spray SPRAY TWO SPRAYS IN EACH NOSTRIL ONCE DAILY 16 g 11  . olmesartan (BENICAR) 40 MG tablet Take 1 tablet (40 mg total) by mouth daily. 90 tablet 3  . pantoprazole (PROTONIX) 40 MG tablet Take 1 tablet (40 mg total) by mouth daily. 90 tablet 3  . rosuvastatin (CRESTOR) 40 MG tablet Take 1 tablet (40 mg total) by mouth daily. 90 tablet 3  . triamcinolone cream (KENALOG) 0.1 % Apply 1 application topically 2 (two) times daily. 45 g 3  . vitamin B-12 (CYANOCOBALAMIN) 1000 MCG tablet Take 1 tablet (1,000 mcg total) by mouth daily. 100 tablet 3  . VOLTAREN 1 % GEL APPLY 4 GRAMS TOPICALLY 4 TIMES A DAY AS DIRECTED 200 g 1  . loratadine (CLARITIN) 10 MG tablet Take 1 tablet (10 mg total) by mouth daily. 100 tablet 3  . tadalafil (CIALIS) 20 MG tablet Take 1 tablet (20 mg total) by mouth daily as needed for erectile dysfunction. 30 tablet 3  . EDARBI 80 MG TABS TAKE ONE TABLET BY MOUTH DAILY 30 tablet 5   No facility-administered medications prior to visit.     ROS: Review of Systems  Constitutional: Negative for appetite change, fatigue and unexpected weight change.  HENT: Negative for congestion, nosebleeds, sneezing, sore throat and trouble swallowing.   Eyes: Negative for itching and visual disturbance.  Respiratory: Negative for cough.   Cardiovascular: Negative for chest pain, palpitations and leg swelling.  Gastrointestinal: Negative for  abdominal distention, blood in stool, diarrhea and nausea.  Genitourinary: Negative for frequency and hematuria.  Musculoskeletal: Negative for back pain, gait problem, joint swelling and neck pain.  Skin: Positive for color change and rash.  Neurological: Negative for dizziness, tremors, speech difficulty and weakness.  Psychiatric/Behavioral: Negative for agitation, dysphoric mood, sleep disturbance and suicidal ideas. The patient is not nervous/anxious.     Objective:  BP 140/90 (BP Location: Left Arm, Patient Position: Sitting, Cuff Size: Normal)   Pulse 77   Temp 98.4 F (36.9 C) (Oral)   Ht 6' (1.829 m)   Wt 263 lb (119.3 kg)   SpO2 96%   BMI 35.67 kg/m   BP Readings from Last 3 Encounters:  08/28/18 140/90  04/03/18 130/78  03/12/18 130/76    Wt Readings from Last 3 Encounters:  08/28/18 263 lb (119.3 kg)  04/03/18 258 lb (117 kg)  03/12/18 260 lb (117.9 kg)    Physical Exam  Constitutional: He is oriented to person, place, and time. He appears well-developed. No distress.  NAD  HENT:  Mouth/Throat: Oropharynx is clear and moist.  Eyes: Pupils are equal, round, and reactive to light. Conjunctivae are normal.  Neck: Normal range of motion. No JVD present. No thyromegaly present.  Cardiovascular: Normal rate, regular rhythm, normal heart sounds and intact distal pulses. Exam reveals no gallop and no friction rub.  No murmur heard. Pulmonary/Chest: Effort normal and breath sounds normal. No respiratory distress. He has no wheezes. He has no rales. He exhibits no tenderness.  Abdominal: Soft. Bowel sounds are normal. He exhibits no distension and no mass. There is no tenderness. There is no rebound and no guarding.  Musculoskeletal: Normal range of motion. He exhibits no edema or tenderness.  Lymphadenopathy:    He has no cervical adenopathy.  Neurological: He is alert and oriented to person, place, and time. He has normal reflexes. No cranial nerve deficit. He  exhibits normal muscle tone. He displays a negative Romberg sign. Coordination and gait normal.  Skin: Skin is warm and dry. Rash noted.  Psychiatric: He has a normal mood and affect. His behavior is normal. Judgment and thought content normal.  obese LE rash - better  Lab Results  Component Value Date   WBC 6.2 03/29/2018   HGB 15.6 03/29/2018   HCT 45.7 03/29/2018   PLT 164.0 03/29/2018   GLUCOSE 109 (H) 03/29/2018   CHOL 181 03/29/2018   TRIG 111.0 03/29/2018   HDL 45.20 03/29/2018   LDLDIRECT 146.2 10/07/2010   LDLCALC 113 (H) 03/29/2018   ALT 58 (H) 03/29/2018   AST 35 03/29/2018   NA 137 03/29/2018   K 4.5 03/29/2018   CL 102 03/29/2018   CREATININE 0.95 03/29/2018   BUN 19 03/29/2018   CO2 26 03/29/2018   TSH 2.98 03/29/2018   PSA 0.59 03/29/2018   INR 1.02 08/31/2014   HGBA1C 6.0 10/05/2017    Dg Cervical Spine Complete  Result Date: 06/20/2016 CLINICAL DATA:  Neck pain and left shoulder pain EXAM: CERVICAL SPINE - COMPLETE 4+ VIEW COMPARISON:  None. FINDINGS: Normal alignment with straightening of the cervical lordosis. Disc degeneration and spurring most prominent C5-6 and C6-7. Mild right foraminal narrowing at C3-4 due to spurring. Mild right foraminal narrowing C5-6 and C6-7. Mild left foraminal narrowing C5-6 and C6-7 due to spurring. Negative for fracture or mass.  Soft tissues normal. IMPRESSION: Cervical spondylosis with mild foraminal narrowing bilaterally due to spurring. Electronically Signed   By: Franchot Gallo M.D.   On: 06/20/2016 09:41    Assessment & Plan:   There are no diagnoses linked to this encounter.   No orders of the defined types were placed in this encounter.    Follow-up: No follow-ups on file.  Walker Kehr, MD

## 2018-08-28 NOTE — Assessment & Plan Note (Addendum)
Cialis prn  Other options discussed - Alprostadil inj Rx Urology ref Dr Thomasene Mohair

## 2018-08-28 NOTE — Assessment & Plan Note (Signed)
[name]

## 2018-08-28 NOTE — Patient Instructions (Signed)

## 2018-08-28 NOTE — Assessment & Plan Note (Signed)
Better  Ultravate

## 2018-08-28 NOTE — Assessment & Plan Note (Signed)
CT ca scoring info

## 2018-09-16 ENCOUNTER — Encounter: Payer: Self-pay | Admitting: Internal Medicine

## 2018-09-25 ENCOUNTER — Other Ambulatory Visit: Payer: Self-pay | Admitting: Internal Medicine

## 2018-09-25 DIAGNOSIS — E785 Hyperlipidemia, unspecified: Secondary | ICD-10-CM

## 2018-10-17 ENCOUNTER — Ambulatory Visit (INDEPENDENT_AMBULATORY_CARE_PROVIDER_SITE_OTHER)
Admission: RE | Admit: 2018-10-17 | Discharge: 2018-10-17 | Disposition: A | Discharge status: Home / Self Care | Payer: Medicare HMO | Source: Ambulatory Visit | Attending: Internal Medicine | Admitting: Internal Medicine

## 2018-10-17 DIAGNOSIS — E785 Hyperlipidemia, unspecified: Secondary | ICD-10-CM

## 2018-10-23 ENCOUNTER — Encounter: Payer: Self-pay | Admitting: Internal Medicine

## 2018-10-23 ENCOUNTER — Ambulatory Visit (INDEPENDENT_AMBULATORY_CARE_PROVIDER_SITE_OTHER): Payer: Medicare HMO | Admitting: Internal Medicine

## 2018-10-23 DIAGNOSIS — Z6835 Body mass index (BMI) 35.0-35.9, adult: Secondary | ICD-10-CM | POA: Diagnosis not present

## 2018-10-23 DIAGNOSIS — K760 Fatty (change of) liver, not elsewhere classified: Secondary | ICD-10-CM | POA: Diagnosis not present

## 2018-10-23 DIAGNOSIS — E669 Obesity, unspecified: Secondary | ICD-10-CM | POA: Insufficient documentation

## 2018-10-23 DIAGNOSIS — E538 Deficiency of other specified B group vitamins: Secondary | ICD-10-CM

## 2018-10-23 DIAGNOSIS — I2583 Coronary atherosclerosis due to lipid rich plaque: Secondary | ICD-10-CM

## 2018-10-23 DIAGNOSIS — M79672 Pain in left foot: Secondary | ICD-10-CM | POA: Diagnosis not present

## 2018-10-23 DIAGNOSIS — I25119 Atherosclerotic heart disease of native coronary artery with unspecified angina pectoris: Secondary | ICD-10-CM | POA: Insufficient documentation

## 2018-10-23 DIAGNOSIS — E785 Hyperlipidemia, unspecified: Secondary | ICD-10-CM | POA: Diagnosis not present

## 2018-10-23 DIAGNOSIS — L439 Lichen planus, unspecified: Secondary | ICD-10-CM

## 2018-10-23 DIAGNOSIS — I2510 Atherosclerotic heart disease of native coronary artery without angina pectoris: Secondary | ICD-10-CM | POA: Diagnosis not present

## 2018-10-23 MED ORDER — METHYLPREDNISOLONE ACETATE 80 MG/ML IJ SUSP
80.0000 mg | Freq: Once | INTRAMUSCULAR | Status: AC
  Administered 2018-10-23: 80 mg via INTRALESIONAL

## 2018-10-23 NOTE — Assessment & Plan Note (Signed)
Dr Tamala Julian appt

## 2018-10-23 NOTE — Assessment & Plan Note (Signed)
CT Ca score 551 1/20 Crestor  ASA

## 2018-10-23 NOTE — Assessment & Plan Note (Signed)
Goal LDL 70 On Crestor

## 2018-10-23 NOTE — Assessment & Plan Note (Signed)
BMI 35 Wt Man ref

## 2018-10-23 NOTE — Assessment & Plan Note (Signed)
Injected lesions

## 2018-10-23 NOTE — Assessment & Plan Note (Signed)
On B12 

## 2018-10-23 NOTE — Assessment & Plan Note (Signed)
[facility] loss [facility]

## 2018-10-23 NOTE — Progress Notes (Signed)
Subjective:  Patient ID: Zachary Valdez, male    DOB: [birth year]  Age: 68 y.o. MRN: [id number]  CC: No chief complaint on file.   HPI Zachary Valdez presents for CAD on CT, dyslipidemia, skin lesions on the LEs  Outpatient Medications Prior to Visit  Medication Sig Dispense Refill  . alprostadil (EDEX) 20 MCG injection 20 mcg by Intracavitary route as needed for erectile dysfunction. use no more than 3 times per week 3 each 6  . aspirin EC 81 MG tablet Take 1 tablet (81 mg total) by mouth daily. 100 tablet 3  . Azilsartan Medoxomil (EDARBI) 80 MG TABS Take 1 tablet (80 mg total) by mouth daily. 90 tablet 3  . diclofenac (VOLTAREN) 50 MG EC tablet Take 1 tablet (50 mg total) by mouth 2 (two) times daily as needed. 180 tablet 2  . fluticasone (FLONASE) 50 MCG/ACT nasal spray SPRAY TWO SPRAYS IN EACH NOSTRIL ONCE DAILY 16 g 11  . pantoprazole (PROTONIX) 40 MG tablet Take 1 tablet (40 mg total) by mouth daily. 90 tablet 3  . rosuvastatin (CRESTOR) 40 MG tablet Take 1 tablet (40 mg total) by mouth daily. 90 tablet 3  . triamcinolone cream (KENALOG) 0.1 % Apply 1 application topically 2 (two) times daily. 45 g 3  . vitamin B-12 (CYANOCOBALAMIN) 1000 MCG tablet Take 1 tablet (1,000 mcg total) by mouth daily. 100 tablet 3  . VOLTAREN 1 % GEL APPLY 4 GRAMS TOPICALLY 4 TIMES A DAY AS DIRECTED 200 g 1  . loratadine (CLARITIN) 10 MG tablet Take 1 tablet (10 mg total) by mouth daily. 100 tablet 3  . tadalafil (CIALIS) 20 MG tablet Take 1 tablet (20 mg total) by mouth daily as needed for erectile dysfunction. 30 tablet 3   No facility-administered medications prior to visit.     ROS: Review of Systems  Constitutional: Negative for appetite change, fatigue and unexpected weight change.  HENT: Negative for congestion, nosebleeds, sneezing, sore throat and trouble swallowing.   Eyes: Negative for itching and visual disturbance.  Respiratory: Negative for cough.   Cardiovascular: Negative for chest  pain, palpitations and leg swelling.  Gastrointestinal: Negative for abdominal distention, blood in stool, diarrhea and nausea.  Genitourinary: Negative for frequency and hematuria.  Musculoskeletal: Negative for back pain, gait problem, joint swelling and neck pain.  Skin: Positive for color change and rash.  Neurological: Negative for dizziness, tremors, speech difficulty and weakness.  Psychiatric/Behavioral: Negative for agitation, dysphoric mood, sleep disturbance and suicidal ideas. The patient is not nervous/anxious.     Objective:  BP (!) 142/84 (BP Location: Left Arm, Patient Position: Sitting, Cuff Size: Large)   Pulse 81   Temp 98.3 F (36.8 C) (Oral)   Ht 6' (1.829 m)   Wt 265 lb (120.2 kg)   SpO2 96%   BMI 35.94 kg/m   BP Readings from Last 3 Encounters:  10/23/18 (!) 142/84  08/28/18 140/90  04/03/18 130/78    Wt Readings from Last 3 Encounters:  10/23/18 265 lb (120.2 kg)  08/28/18 263 lb (119.3 kg)  04/03/18 258 lb (117 kg)    Physical Exam Constitutional:      General: He is not in acute distress.    Appearance: He is well-developed.     Comments: NAD  Eyes:     Conjunctiva/sclera: Conjunctivae normal.     Pupils: Pupils are equal, round, and reactive to light.  Neck:     Musculoskeletal: Normal range of motion.  Thyroid: No thyromegaly.     Vascular: No JVD.  Cardiovascular:     Rate and Rhythm: Normal rate and regular rhythm.     Heart sounds: Normal heart sounds. No murmur. No friction rub. No gallop.   Pulmonary:     Effort: Pulmonary effort is normal. No respiratory distress.     Breath sounds: Normal breath sounds. No wheezing or rales.  Chest:     Chest wall: No tenderness.  Abdominal:     General: Bowel sounds are normal. There is no distension.     Palpations: Abdomen is soft. There is no mass.     Tenderness: There is no abdominal tenderness. There is no guarding or rebound.  Musculoskeletal: Normal range of motion.         General: No tenderness.  Lymphadenopathy:     Cervical: No cervical adenopathy.  Skin:    General: Skin is warm and dry.     Findings: Rash present.  Neurological:     Mental Status: He is alert and oriented to person, place, and time.     Cranial Nerves: No cranial nerve deficit.     Motor: No abnormal muscle tone.     Coordination: Coordination normal.     Gait: Gait normal.     Deep Tendon Reflexes: Reflexes are normal and symmetric.  Psychiatric:        Behavior: Behavior normal.        Thought Content: Thought content normal.        Judgment: Judgment normal.   rash lesions and excoriations - B LEs Obes   Procedure: skin lesion steroid injection Reason: skin lesion, symptomatic Verbal consent was obtained.  Skin was cleaned with betadine and alcohol.  B legs lesion was injected with 10 mg of DepoMedrol and 0.5 cc 1% Lidocaine each (x8).  Tolerated well Complications none Band-aid applied   Lab Results  Component Value Date   WBC 6.2 03/29/2018   HGB 15.6 03/29/2018   HCT 45.7 03/29/2018   PLT 164.0 03/29/2018   GLUCOSE 109 (H) 03/29/2018   CHOL 181 03/29/2018   TRIG 111.0 03/29/2018   HDL 45.20 03/29/2018   LDLDIRECT 146.2 10/07/2010   LDLCALC 113 (H) 03/29/2018   ALT 58 (H) 03/29/2018   AST 35 03/29/2018   NA 137 03/29/2018   K 4.5 03/29/2018   CL 102 03/29/2018   CREATININE 0.95 03/29/2018   BUN 19 03/29/2018   CO2 26 03/29/2018   TSH 2.98 03/29/2018   PSA 0.59 03/29/2018   INR 1.02 08/31/2014   HGBA1C 6.0 10/05/2017    Ct Cardiac Scoring  Addendum Date: 10/17/2018   ADDENDUM REPORT: 10/17/2018 14:31 CLINICAL DATA:  Risk stratification EXAM: Coronary Calcium Score TECHNIQUE: The patient was scanned on a [employer] scanner. Axial non-contrast 3 mm slices were carried out through the heart. The data set was analyzed on a dedicated work station and scored using the [place]. FINDINGS: Non-cardiac: See separate report from [facility].  Ascending Aorta: Normal size, mild diffuse calcifications in the aortic root and aortic valve. Pericardium: Normal. Coronary arteries: Normal origin. IMPRESSION: Coronary calcium score of 551. This was 19 percentile for age and sex matched control. Electronically Signed   By: Ena Dawley   On: 10/17/2018 14:31   Result Date: 10/17/2018 EXAM: OVER-READ INTERPRETATION  CT CHEST The following report is an over-read performed by radiologist Dr. Rolm Baptise of [facility], [place] on 10/17/2018. This over-read does not include interpretation of cardiac or  coronary anatomy or pathology. The coronary calcium score interpretation by the cardiologist is attached. COMPARISON:  None. FINDINGS: Vascular: Heart is normal size. Visualized aorta normal caliber. Calcifications at the aortic root. Scattered calcifications in the visualized descending thoracic aorta. Mediastinum/Nodes: No adenopathy in the lower mediastinum or hila. Lungs/Pleura: Visualized lungs clear.  No effusions. Upper Abdomen: Fatty infiltration of the visualized liver. No acute findings Musculoskeletal: Chest wall soft tissues are unremarkable. No acute bony abnormality. IMPRESSION: Aortic atherosclerosis. Fatty infiltration of the liver. Electronically Signed: By: Rolm Baptise M.D. On: 10/17/2018 13:43    Assessment & Plan:   Diagnoses and all orders for this visit:  Coronary artery disease due to lipid rich plaque  B12 deficiency  Dyslipidemia  Fatty liver  Foot pain, left     No orders of the defined types were placed in this encounter.    Follow-up: No follow-ups on file.  Walker Kehr, MD

## 2018-10-31 DIAGNOSIS — H524 Presbyopia: Secondary | ICD-10-CM | POA: Diagnosis not present

## 2018-11-04 NOTE — Progress Notes (Addendum)
[facility] [address] [place], [place] [address] Phone: [phone] Subjective:    I Zachary Valdez am serving as a Education administrator for Dr. Hulan Saas.    CC: Knee pain foot pain  RWE:[id number]      11/05/2018  Zachary Valdez is a 68 y.o. male coming in with complaint of bilateral numbness in feet. States he has had no back pain. Neck and shoulder pain. Using Spenco insoles that feel "tight". Favors the right knee. Left knee has been replaced. Patient has not been seen for greater than 2 years.  States that the pain had been intermittent but seems to be worsening recently.  Onset- Chronic   Location-foot pain bilaterally left greater than right Duration-  Character-warmth numbness and pain but does have intermittent pain Aggravating factors- walking, standing for long periods of time  Reliving factors-  Therapies tried- Diclofenac topical. Severity-5 out of 10   Also right knee pain.  Increasing instability.  Left knee replacement noted.  Patient states that it feels like his knee is worsening such as his left knee before he had the replacement.  Increasing swelling as well.  Past Medical History:  Diagnosis Date  . Allergic rhinitis   . Anxiety   . GERD (gastroesophageal reflux disease)   . Hyperlipidemia   . Hyperplastic colon polyp 2006  . Hypertension   . OA (osteoarthritis)   . Obesity   . Sleep apnea    cpap-   . Ulcerative esophagitis 2006   Dr. Fuller Plan  . Vitamin B 12 deficiency    Past Surgical History:  Procedure Laterality Date  . KNEE ARTHROSCOPY     Right  . TOTAL KNEE ARTHROPLASTY Left 09/07/2014   Procedure: LEFT TOTAL KNEE ARTHROPLASTY;  Surgeon: Gearlean Alf, MD;  Location: [facility];  Service: Orthopedics;  Laterality: Left;   Social History   Socioeconomic History  . Marital status: Divorced    Spouse name: Not on file  . Number of children: Not on file  . Years of education: Not on file  . Highest education level:  Not on file  Occupational History  . Not on file  Social Needs  . Financial resource strain: Not on file  . Food insecurity:    Worry: Not on file    Inability: Not on file  . Transportation needs:    Medical: Not on file    Non-medical: Not on file  Tobacco Use  . Smoking status: Former Smoker    Types: Cigarettes    Last attempt to quit: 10/02/1997    Years since quitting: 21.1  . Smokeless tobacco: Never Used  Substance and Sexual Activity  . Alcohol use: Yes    Alcohol/week: 14.0 standard drinks    Types: 14 Glasses of wine per week  . Drug use: No  . Sexual activity: Not on file  Lifestyle  . Physical activity:    Days per week: Not on file    Minutes per session: Not on file  . Stress: Not on file  Relationships  . Social connections:    Talks on phone: Not on file    Gets together: Not on file    Attends religious service: Not on file    Active member of club or organization: Not on file    Attends meetings of clubs or organizations: Not on file    Relationship status: Not on file  Other Topics Concern  . Not on file  Social History  Narrative   Daily caffeine- 2-3 per day   Allergies  Allergen Reactions  . Atacand [Candesartan Cilexetil]   . Atorvastatin     REACTION: achy  . Enalapril     cough  . Hydrocodone-Acetaminophen     REACTION: SOB, patient states drug made patient feel weird   . Losartan   . Tramadol     headache   Family History  Problem Relation Age of Onset  . Breast cancer Mother   . Hypertension Mother   . Hypertension Other   . Colon cancer Neg Hx      Current Outpatient Medications (Cardiovascular):  .  alprostadil (EDEX) 20 MCG injection, 20 mcg by Intracavitary route as needed for erectile dysfunction. use no more than 3 times per week .  Azilsartan Medoxomil (EDARBI) 80 MG TABS, Take 1 tablet (80 mg total) by mouth daily. .  rosuvastatin (CRESTOR) 40 MG tablet, Take 1 tablet (40 mg total) by mouth daily. .  tadalafil  (CIALIS) 20 MG tablet, Take 1 tablet (20 mg total) by mouth daily as needed for erectile dysfunction.  Current Outpatient Medications (Respiratory):  .  fluticasone (FLONASE) 50 MCG/ACT nasal spray, SPRAY TWO SPRAYS IN EACH NOSTRIL ONCE DAILY .  loratadine (CLARITIN) 10 MG tablet, Take 1 tablet (10 mg total) by mouth daily.  Current Outpatient Medications (Analgesics):  .  aspirin EC 81 MG tablet, Take 1 tablet (81 mg total) by mouth daily. .  diclofenac (VOLTAREN) 50 MG EC tablet, Take 1 tablet (50 mg total) by mouth 2 (two) times daily as needed.  Current Outpatient Medications (Hematological):  .  vitamin B-12 (CYANOCOBALAMIN) 1000 MCG tablet, Take 1 tablet (1,000 mcg total) by mouth daily.  Current Outpatient Medications (Other):  .  pantoprazole (PROTONIX) 40 MG tablet, Take 1 tablet (40 mg total) by mouth daily. Marland Kitchen  triamcinolone cream (KENALOG) 0.1 %, Apply 1 application topically 2 (two) times daily. .  VOLTAREN 1 % GEL, APPLY 4 GRAMS TOPICALLY 4 TIMES A DAY AS DIRECTED .  gabapentin (NEURONTIN) 100 MG capsule, Take 2 capsules (200 mg total) by mouth at bedtime. .  Vitamin D, Ergocalciferol, (DRISDOL) 1.25 MG (50000 UT) CAPS capsule, Take 1 capsule (50,000 Units total) by mouth every 7 (seven) days.    Past medical history, social, surgical and family history all reviewed in electronic medical record.  No pertanent information unless stated regarding to the chief complaint.   Review of Systems:  No headache, visual changes, nausea, vomiting, diarrhea, constipation, dizziness, abdominal pain, skin rash, fevers, chills, night sweats, weight loss, swollen lymph nodes, body aches, joint swelling,  chest pain, shortness of breath, mood changes.  Positive muscle aches  Objective  Blood pressure (!) 144/80, pulse 67, height 6' (1.829 m), weight 265 lb (120.2 kg), SpO2 97 %.    General: No apparent distress alert and oriented x3 mood and affect normal, dressed appropriately.  HEENT:  Pupils equal, extraocular movements intact  Respiratory: Patient's speak in full sentences and does not appear short of breath  Cardiovascular: No lower extremity edema, non tender, no erythema  Skin: Warm dry intact with no signs of infection or rash on extremities or on axial skeleton.  Abdomen: Soft nontender  Neuro: Cranial nerves II through XII are intact, neurovascularly intact in all extremities with 2+ DTRs and 2+ pulses.  Lymph: No lymphadenopathy of posterior or anterior cervical chain or axillae bilaterally.  Gait antalgic gait MSK:  Non tender with full range of motion and good stability  and symmetric strength and tone of shoulders, elbows, wrist, hip, and ankles bilaterally.  Moderate arthritic changes.  Left knee exam does have replacement. Foot exam shows that patient does have a transverse arch breakdown bilaterally right greater than left.  Left foot does though have a positive squeeze test.   Knee: Right valgus deformity noted. Large thigh to calf ratio.  Tender to palpation over medial and PF joint line.  ROM full in flexion and extension and lower leg rotation. instability with valgus force.  painful patellar compression. Patellar glide with moderate crepitus. Patellar and quadriceps tendons unremarkable. Hamstring and quadriceps strength is normal. Contralateral knee shows replacement.   [id number]; 15 additional minutes spent for Therapeutic exercises as stated in above notes.  This included exercises focusing on stretching, strengthening, with significant focus on eccentric aspects.   Long term goals include an improvement in range of motion, strength, endurance as well as avoiding reinjury. Patient's frequency would include in 1-2 times a day, 3-5 times a week for a duration of 6-12 weeks. Exercises for the foot include:  Stretches to help lengthen the lower leg and plantar fascia areas Theraband exercises for the lower leg and ankle to help strengthen the surrounding  area- dorsiflexion, plantarflexion, inversion, eversion Massage rolling on the plantar surface of the foot with a frozen bottle, tennis ball or golf ball Towel or marble pick-ups to strengthen the plantar surface of the foot Weight bearing exercises to increase balance and overall stability   Proper technique shown and discussed handout in great detail with ATC.  All questions were discussed and answered.     Impression and Recommendations:     This case required medical decision making of moderate complexity. The above documentation has been reviewed and is accurate and complete Lyndal Pulley, DO       Note: This dictation was prepared with Dragon dictation along with smaller phrase technology. Any transcriptional errors that result from this process are unintentional.

## 2018-11-05 ENCOUNTER — Encounter: Payer: Self-pay | Admitting: Family Medicine

## 2018-11-05 ENCOUNTER — Ambulatory Visit: Payer: Medicare HMO | Admitting: Family Medicine

## 2018-11-05 DIAGNOSIS — G609 Hereditary and idiopathic neuropathy, unspecified: Secondary | ICD-10-CM

## 2018-11-05 DIAGNOSIS — M216X9 Other acquired deformities of unspecified foot: Secondary | ICD-10-CM | POA: Diagnosis not present

## 2018-11-05 DIAGNOSIS — G629 Polyneuropathy, unspecified: Secondary | ICD-10-CM | POA: Insufficient documentation

## 2018-11-05 DIAGNOSIS — M1711 Unilateral primary osteoarthritis, right knee: Secondary | ICD-10-CM

## 2018-11-05 MED ORDER — VITAMIN D (ERGOCALCIFEROL) 1.25 MG (50000 UNIT) PO CAPS: 50000.0000 [IU] | ORAL_CAPSULE | ORAL | 0 refills | Status: DC

## 2018-11-05 MED ORDER — GABAPENTIN 100 MG PO CAPS: 200.0000 mg | ORAL_CAPSULE | Freq: Every day | ORAL | 3 refills | Status: DC

## 2018-11-05 NOTE — Assessment & Plan Note (Signed)
Transverse arch.  Discussed proper shoes, over-the-counter orthotics, may need custom orthotics.  Follow-up again in 4 to 8 weeks

## 2018-11-05 NOTE — Assessment & Plan Note (Signed)
Possible peripheral neuropathy.  Encourage patient to try gabapentin, we discussed giving the orthotics for the breakdown of the transverse arch.  Does have arthritic changes of multiple joints.  Continue laboratory work-up.  Continues the B12.  Follow-up again for 8 weeks.

## 2018-11-05 NOTE — Patient Instructions (Signed)
Good to see you  Gabapentin 200mg  at night Once weekly vitamin D for 12 weeks.  Stop the daily  Over the counter get Tart cherry extract 1500mg  nightly  Try the orthotics  Get rid of the callus  Do not lace the last eye of the shoe See me again in 4ish weeks

## 2018-11-19 DIAGNOSIS — M1711 Unilateral primary osteoarthritis, right knee: Secondary | ICD-10-CM | POA: Insufficient documentation

## 2018-11-19 NOTE — Assessment & Plan Note (Signed)
Patient does have degenerative arthritis of the right knee.  Patient does have abnormal thigh to calf ratio and instability of the knee.  Will be fitted for custom brace.  Patient declined any type of injection but will consider it at follow-up.  Follow-up again in 4 to 6 weeks

## 2018-12-03 ENCOUNTER — Ambulatory Visit: Payer: Medicare HMO | Admitting: Family Medicine

## 2018-12-03 ENCOUNTER — Ambulatory Visit (INDEPENDENT_AMBULATORY_CARE_PROVIDER_SITE_OTHER)
Admission: RE | Admit: 2018-12-03 | Discharge: 2018-12-03 | Disposition: A | Discharge status: Home / Self Care | Payer: Medicare HMO | Source: Ambulatory Visit | Attending: Family Medicine | Admitting: Family Medicine

## 2018-12-03 ENCOUNTER — Encounter: Payer: Self-pay | Admitting: Family Medicine

## 2018-12-03 VITALS — BP 120/78 | HR 85 | Ht 72.0 in | Wt 265.0 lb

## 2018-12-03 DIAGNOSIS — R635 Abnormal weight gain: Secondary | ICD-10-CM | POA: Diagnosis not present

## 2018-12-03 DIAGNOSIS — M79672 Pain in left foot: Secondary | ICD-10-CM | POA: Diagnosis not present

## 2018-12-03 DIAGNOSIS — G8929 Other chronic pain: Secondary | ICD-10-CM

## 2018-12-03 DIAGNOSIS — G609 Hereditary and idiopathic neuropathy, unspecified: Secondary | ICD-10-CM

## 2018-12-03 DIAGNOSIS — M1711 Unilateral primary osteoarthritis, right knee: Secondary | ICD-10-CM | POA: Diagnosis not present

## 2018-12-03 DIAGNOSIS — M25561 Pain in right knee: Secondary | ICD-10-CM

## 2018-12-03 NOTE — Assessment & Plan Note (Signed)
Discussed the neurological ramifications and other possible triggers such as alcohol consumption.  Patient is going to try to change his diet and see if this would beneficial.  Discussed continuing the gabapentin and possibly increasing.  Follow-up again in 2 to 3 months

## 2018-12-03 NOTE — Progress Notes (Signed)
[facility] [place], [place] [address] Phone: [phone] Subjective:     CC: Foot and knee pain follow-up  TWS:[id number]   11/05/2018: Patient does have degenerative arthritis of the right knee.  Patient does have abnormal thigh to calf ratio and instability of the knee.  Will be fitted for custom brace.  Patient declined any type of injection but will consider it at follow-up.  Follow-up again in 4 to 6 weeks  Transverse arch.  Discussed proper shoes, over-the-counter orthotics, may need custom orthotics.  Follow-up again in 4 to 8 weeks  Update 12/03/2018: Zachary Valdez is a 68 y.o. male coming in with complaint of foot and knee pain. Patient states that he continues to have constant knee pain. Has not noticed any improvement in his feet either. Is unable to walk barefoot as he feels like there is a marble underneath his foot.   Patient does have more of a peripheral neuropathy.  Patient has not made significant improvement.  Patient does state that the pain in his feet at the end of the day though has improved a little bit since he has been using the over-the-counter orthotics.  Patient wants to be losing weight and is going to be following [facility].  Patient feels if he does this he may make some improvement.     Past Medical History:  Diagnosis Date  . Allergic rhinitis   . Anxiety   . GERD (gastroesophageal reflux disease)   . Hyperlipidemia   . Hyperplastic colon polyp 2006  . Hypertension   . OA (osteoarthritis)   . Obesity   . Sleep apnea    cpap-   . Ulcerative esophagitis 2006   Dr. Fuller Plan  . Vitamin B 12 deficiency    Past Surgical History:  Procedure Laterality Date  . KNEE ARTHROSCOPY     Right  . TOTAL KNEE ARTHROPLASTY Left 09/07/2014   Procedure: LEFT TOTAL KNEE ARTHROPLASTY;  Surgeon: Gearlean Alf, MD;  Location: [facility];  Service: Orthopedics;  Laterality: Left;    Social History   Socioeconomic History  . Marital status: Divorced    Spouse name: Not on file  . Number of children: Not on file  . Years of education: Not on file  . Highest education level: Not on file  Occupational History  . Not on file  Social Needs  . Financial resource strain: Not on file  . Food insecurity:    Worry: Not on file    Inability: Not on file  . Transportation needs:    Medical: Not on file    Non-medical: Not on file  Tobacco Use  . Smoking status: Former Smoker    Types: Cigarettes    Last attempt to quit: 10/02/1997    Years since quitting: 21.1  . Smokeless tobacco: Never Used  Substance and Sexual Activity  . Alcohol use: Yes    Alcohol/week: 14.0 standard drinks    Types: 14 Glasses of wine per week  . Drug use: No  . Sexual activity: Not on file  Lifestyle  . Physical activity:    Days per week: Not on file    Minutes per session: Not on file  . Stress: Not on file  Relationships  . Social connections:    Talks on phone: Not on file    Gets together: Not on file    Attends religious service: Not on file  Active member of club or organization: Not on file    Attends meetings of clubs or organizations: Not on file    Relationship status: Not on file  Other Topics Concern  . Not on file  Social History Narrative   Daily caffeine- 2-3 per day   Allergies  Allergen Reactions  . Atacand [Candesartan Cilexetil]   . Atorvastatin     REACTION: achy  . Enalapril     cough  . Hydrocodone-Acetaminophen     REACTION: SOB, patient states drug made patient feel weird   . Losartan   . Tramadol     headache   Family History  Problem Relation Age of Onset  . Breast cancer Mother   . Hypertension Mother   . Hypertension Other   . Colon cancer Neg Hx      Current Outpatient Medications (Cardiovascular):  .  alprostadil (EDEX) 20 MCG injection, 20 mcg by Intracavitary route as needed for erectile dysfunction. use no more than 3 times  per week .  Azilsartan Medoxomil (EDARBI) 80 MG TABS, Take 1 tablet (80 mg total) by mouth daily. .  rosuvastatin (CRESTOR) 40 MG tablet, Take 1 tablet (40 mg total) by mouth daily. .  tadalafil (CIALIS) 20 MG tablet, Take 1 tablet (20 mg total) by mouth daily as needed for erectile dysfunction.  Current Outpatient Medications (Respiratory):  .  fluticasone (FLONASE) 50 MCG/ACT nasal spray, SPRAY TWO SPRAYS IN EACH NOSTRIL ONCE DAILY .  loratadine (CLARITIN) 10 MG tablet, Take 1 tablet (10 mg total) by mouth daily.  Current Outpatient Medications (Analgesics):  .  aspirin EC 81 MG tablet, Take 1 tablet (81 mg total) by mouth daily. .  diclofenac (VOLTAREN) 50 MG EC tablet, Take 1 tablet (50 mg total) by mouth 2 (two) times daily as needed.  Current Outpatient Medications (Hematological):  .  vitamin B-12 (CYANOCOBALAMIN) 1000 MCG tablet, Take 1 tablet (1,000 mcg total) by mouth daily.  Current Outpatient Medications (Other):  .  gabapentin (NEURONTIN) 100 MG capsule, Take 2 capsules (200 mg total) by mouth at bedtime. .  pantoprazole (PROTONIX) 40 MG tablet, Take 1 tablet (40 mg total) by mouth daily. Marland Kitchen  triamcinolone cream (KENALOG) 0.1 %, Apply 1 application topically 2 (two) times daily. .  Vitamin D, Ergocalciferol, (DRISDOL) 1.25 MG (50000 UT) CAPS capsule, Take 1 capsule (50,000 Units total) by mouth every 7 (seven) days. .  VOLTAREN 1 % GEL, APPLY 4 GRAMS TOPICALLY 4 TIMES A DAY AS DIRECTED    Past medical history, social, surgical and family history all reviewed in electronic medical record.  No pertanent information unless stated regarding to the chief complaint.   Review of Systems:  No headache, visual changes, nausea, vomiting, diarrhea, constipation, dizziness, abdominal pain, skin rash, fevers, chills, night sweats, weight loss, swollen lymph nodes, body aches, joint swelling,  chest pain, shortness of breath, mood changes.  Positive muscle aches  Objective  Blood  pressure 120/78, pulse 85, height 6' (1.829 m), weight 265 lb (120.2 kg).    General: No apparent distress alert and oriented x3 mood and affect normal, dressed appropriately.  HEENT: Pupils equal, extraocular movements intact  Respiratory: Patient's speak in full sentences and does not appear short of breath  Cardiovascular: No lower extremity edema, non tender, no erythema  Skin: Warm dry intact with no signs of infection or rash on extremities or on axial skeleton.  Abdomen: Soft nontender obese Neuro: Cranial nerves II through XII are intact, neurovascularly intact in  all extremities with 2+ DTRs and 2+ pulses.  Lymph: No lymphadenopathy of posterior or anterior cervical chain or axillae bilaterally.  Gait mild antalgic MSK:  Non tender with full range of motion and good stability and symmetric strength and tone of shoulders, elbows, wrist, hip, and ankles bilaterally.  Referral neuropathy noted. Knee: Right valgus deformity noted. Large thigh to calf ratio.  Tender to palpation over medial and PF joint line.  ROM full in flexion and extension and lower leg rotation. instability with valgus force.  painful patellar compression. Patellar glide with moderate crepitus. Patellar and quadriceps tendons unremarkable. Hamstring and quadriceps strength is normal. Contralateral knee shows knee replacement   Exam shows breakdown of the transverse arch bilaterally.  Left foot does have a positive squeeze test with pain between the fourth and fifth metatarsal head significantly. Impression and Recommendations:     This case required medical decision making of moderate complexity. The above documentation has been reviewed and is accurate and complete Lyndal Pulley, DO       Note: This dictation was prepared with Dragon dictation along with smaller phrase technology. Any transcriptional errors that result from this process are unintentional.

## 2018-12-03 NOTE — Assessment & Plan Note (Signed)
Encourage weight loss.  Patient will [facility].  Patient will follow-up with me again in 4 weeks

## 2018-12-03 NOTE — Assessment & Plan Note (Signed)
Neuroma noted.

## 2018-12-03 NOTE — Patient Instructions (Addendum)
Good to see you  Ice is your friend Stay active I think weight loss would be great  Cut back on the alcohol as well  See me again in 2-3 months and lets check in

## 2018-12-05 ENCOUNTER — Encounter (INDEPENDENT_AMBULATORY_CARE_PROVIDER_SITE_OTHER): Payer: Self-pay

## 2018-12-16 ENCOUNTER — Ambulatory Visit (INDEPENDENT_AMBULATORY_CARE_PROVIDER_SITE_OTHER): Payer: Medicare HMO | Admitting: Family Medicine

## 2018-12-16 ENCOUNTER — Encounter (INDEPENDENT_AMBULATORY_CARE_PROVIDER_SITE_OTHER): Payer: Self-pay | Admitting: Family Medicine

## 2018-12-16 ENCOUNTER — Other Ambulatory Visit: Payer: Self-pay

## 2018-12-16 VITALS — BP 154/84 | HR 74 | Ht 71.0 in | Wt 259.0 lb

## 2018-12-16 DIAGNOSIS — E559 Vitamin D deficiency, unspecified: Secondary | ICD-10-CM | POA: Diagnosis not present

## 2018-12-16 DIAGNOSIS — Z1331 Encounter for screening for depression: Secondary | ICD-10-CM

## 2018-12-16 DIAGNOSIS — R5383 Other fatigue: Secondary | ICD-10-CM

## 2018-12-16 DIAGNOSIS — I10 Essential (primary) hypertension: Secondary | ICD-10-CM

## 2018-12-16 DIAGNOSIS — Z0289 Encounter for other administrative examinations: Secondary | ICD-10-CM

## 2018-12-16 DIAGNOSIS — R0602 Shortness of breath: Secondary | ICD-10-CM

## 2018-12-16 DIAGNOSIS — E7849 Other hyperlipidemia: Secondary | ICD-10-CM

## 2018-12-16 DIAGNOSIS — Z6836 Body mass index (BMI) 36.0-36.9, adult: Secondary | ICD-10-CM

## 2018-12-16 DIAGNOSIS — R739 Hyperglycemia, unspecified: Secondary | ICD-10-CM | POA: Diagnosis not present

## 2018-12-16 NOTE — Progress Notes (Signed)
Office: [phone]  /  Fax: [phone]   Dear Dr. Evie Lacks. Plotnikov,   Thank you for referring Zachary Valdez to our clinic. The following note includes my evaluation and treatment recommendations.  HPI:   Chief Complaint: OBESITY    [name] [name] has been referred by Evie Lacks. Plotnikov, MD for consultation regarding his obesity and obesity related comorbidities.    Zachary Valdez (MR# [id number]) is a 68 y.o. male who presents on 12/16/2018 for obesity evaluation and treatment. Current BMI is Body mass index is 36.12 kg/m.  [name] has been struggling with his weight for many years and has been unsuccessful in either losing weight, maintaining weight loss, or reaching his healthy weight goal.     [name] attended our information session and states he is currently in the action stage of change and ready to dedicate time achieving and maintaining a healthier weight. [name] is interested in becoming our patient and working on intensive lifestyle modifications including (but not limited to) diet, exercise and weight loss.    [name] states he thinks his family will eat healthier with him his desired weight loss is 49 lbs he has been heavy most of his life he started gaining excessive weight was 6 years ago his heaviest weight ever was 260 lbs he skips meals frequently he is frequently drinking liquids with calories he frequently eats larger portions than normal  he has binge eating behaviors   Fatigue [name] feels his energy is lower than it should be. This has worsened with weight gain and has not worsened recently. [name] denies daytime somnolence and  denies waking up still tired. Patient has a history of obstructive sleep apnea. Patient has a history of symptoms of hypertension. Patient generally gets 8 or 9 hours of sleep per night, and states they generally have generally restful sleep. Snoring is not present with use of a CPAP. Apneic episodes are not present anymore. Epworth  Sleepiness Score is 4.  Dyspnea on exertion Eugine notes increasing shortness of breath with exercising and seems to be worsening over time with weight gain. He notes getting out of breath sooner with activity than he used to. This has not gotten worse recently.   Vitamin D Deficiency Silas has a diagnosis of vitamin D deficiency. He is not currently on vit D and does not have recent labs. [name] admits fatigue.  Hyperlipidemia [name] has hyperlipidemia and is on a statin. He has been trying to improve his cholesterol levels with intensive lifestyle modification including a low saturated fat diet, exercise, and weight loss. He denies any chest pain or myalgias.  Hypertension [name]'s is a 68 y.o. male with hypertension. Mackie's blood pressure is currently elevated today on medications. He is working on weight loss to help control his blood pressure with the goal of decreasing his risk of heart attack and stroke. [name] denies chest pain or headaches.  Hyperglycemia [name] has a history of some elevated blood glucose readings without a diagnosis of diabetes. He admits to polyphagia.  Depression Screen [name]'s Food and Mood (modified PHQ-9) score was 10. Depression screen PHQ 2/9 12/16/2018  Decreased Interest 2  Down, Depressed, Hopeless 1  PHQ - 2 Score 3  Altered sleeping 0  Tired, decreased energy 2  Change in appetite 0  Feeling bad or failure about yourself  1  Trouble concentrating 1  Moving slowly or fidgety/restless 3  Suicidal thoughts 0  PHQ-9 Score 10  Difficult doing work/chores Somewhat difficult  ASSESSMENT AND PLAN:  Other fatigue - Plan: EKG 12-Lead, CBC With Differential, T3, T4, free, TSH, VITAMIN D 25 Hydroxy (Vit-D Deficiency, Fractures)  Shortness of breath on exertion  Vitamin D deficiency  Other hyperlipidemia - Plan: Lipid Panel With LDL/HDL Ratio  Essential hypertension  Hyperglycemia - Plan: Comprehensive metabolic panel, Hemoglobin A1c,  Insulin, random  Depression screening  Class 2 severe obesity with serious comorbidity and body mass index (BMI) of 36.0 to 36.9 in adult, unspecified obesity type (HCC)  PLAN:  Fatigue [name] was informed that his fatigue may be related to obesity, depression or many other causes. Labs will be ordered, and in the meanwhile [name] has agreed to work on diet, exercise and weight loss to help with fatigue. Proper sleep hygiene was discussed including the need for 7-8 hours of quality sleep each night. A sleep study was not ordered based on symptoms and Epworth score. An EKG and an indirect calorimetry was ordered today. [name] agrees to follow up in 2 weeks.  Dyspnea on exertion [name]'s shortness of breath appears to be obesity related and exercise induced. He has agreed to work on weight loss and gradually increase exercise to treat his exercise induced shortness of breath. If [name] follows our instructions and loses weight without improvement of his shortness of breath, we will plan to refer to pulmonology. We will monitor this condition regularly. Renny agrees to this plan.  Vitamin D Deficiency [name] was informed that low vitamin D levels contributes to fatigue and are associated with obesity, breast, and colon cancer. Labs were ordered today and [name] agrees to follow up as directed.  Hyperlipidemia Nadav was informed of the American Heart Association Guidelines emphasizing intensive lifestyle modifications as the first line treatment for hyperlipidemia. We discussed many lifestyle modifications today in depth, and [name] will continue to work on decreasing saturated fats such as fatty red meat, butter and many fried foods. He will also increase vegetables and lean protein in his diet and continue to work on exercise and weight loss efforts. We ordered labs today. [name] agreed to start his diet prescription and to follow up as directed.  Hypertension We discussed sodium restriction, working on  healthy weight loss, and a regular exercise program as the means to achieve improved blood pressure control. We will continue to monitor his blood pressure as well as his progress with the above lifestyle modifications. He will continue his medications as prescribed and to start his diet. He will watch for signs of hypotension as he continues his lifestyle modifications. We will recheck his blood pressure in 2 weeks. Ernie agreed with this plan and agreed to follow up as directed.   Hyperglycemia Fasting labs will be obtained and results with be discussed with Silus in 2 weeks at his follow up visit. In the meanwhile,  Damarious was started on a lower simple carbohydrate diet and will work on weight loss efforts. Laurier will follow up at the agreed upon time.  Depression Screen Haytham had a moderately positive depression screening. Depression is commonly associated with obesity and often results in emotional eating behaviors. We will monitor this closely and work on CBT to help improve the non-hunger eating patterns. Referral to Psychology may be required if no improvement is seen as he continues in our clinic.  Obesity Czar is currently in the action stage of change and his goal is to continue with weight loss efforts. I recommend Ocean begin the structured treatment plan as follows:  He has agreed to  follow the Category 3 plan with Category 4 microwave meals.  Bayan has been instructed to eventually work up to a goal of 150 minutes of combined cardio and strengthening exercise per week for weight loss and overall health benefits. We discussed the following Behavioral Modification Strategies today: increasing lean protein intake, decreasing simple carbohydrates, work on meal planning and easy cooking plans, keeping healthy foods in the home, better snacking choices, and decrease liquid calories.   He was informed of the importance of frequent follow up visits to maximize his success with intensive  lifestyle modifications for his multiple health conditions. He was informed we would discuss his lab results at his next visit unless there is a critical issue that needs to be addressed sooner. Elizabeth agreed to keep his next visit at the agreed upon time to discuss these results.  ALLERGIES: Allergies  Allergen Reactions  . Atacand [Candesartan Cilexetil]   . Atorvastatin     REACTION: achy  . Enalapril     cough  . Hydrocodone-Acetaminophen     REACTION: SOB, patient states drug made patient feel weird   . Losartan   . Tramadol     headache    MEDICATIONS: Current Outpatient Medications on File Prior to Visit  Medication Sig Dispense Refill  . aspirin EC 81 MG tablet Take 1 tablet (81 mg total) by mouth daily. 100 tablet 3  . diclofenac (VOLTAREN) 50 MG EC tablet Take 1 tablet (50 mg total) by mouth 2 (two) times daily as needed. 180 tablet 2  . olmesartan (BENICAR) 40 MG tablet Take 40 mg by mouth daily.    . rosuvastatin (CRESTOR) 40 MG tablet Take 1 tablet (40 mg total) by mouth daily. (Patient taking differently: Take 40 mg by mouth. Take one tablet by mouth every other day) 90 tablet 3  . Vitamin D, Ergocalciferol, (DRISDOL) 1.25 MG (50000 UT) CAPS capsule Take 1 capsule (50,000 Units total) by mouth every 7 (seven) days. 12 capsule 0  . tadalafil (CIALIS) 20 MG tablet Take 1 tablet (20 mg total) by mouth daily as needed for erectile dysfunction. 30 tablet 3  . vitamin B-12 (CYANOCOBALAMIN) 1000 MCG tablet Take 1 tablet (1,000 mcg total) by mouth daily. 100 tablet 3   No current facility-administered medications on file prior to visit.     PAST MEDICAL HISTORY: Past Medical History:  Diagnosis Date  . Allergic rhinitis   . Anxiety   . GERD (gastroesophageal reflux disease)   . Hyperlipidemia   . Hyperplastic colon polyp 2006  . Hypertension   . OA (osteoarthritis)   . Obesity   . Sleep apnea    cpap-   . Ulcerative esophagitis 2006   Dr. Fuller Plan  . Vitamin B 12  deficiency     PAST SURGICAL HISTORY: Past Surgical History:  Procedure Laterality Date  . KNEE ARTHROSCOPY     Right  . TOTAL KNEE ARTHROPLASTY Left 09/07/2014   Procedure: LEFT TOTAL KNEE ARTHROPLASTY;  Surgeon: Gearlean Alf, MD;  Location: [facility];  Service: Orthopedics;  Laterality: Left;    SOCIAL HISTORY: Social History   Tobacco Use  . Smoking status: Former Smoker    Types: Cigarettes    Last attempt to quit: 10/02/1997    Years since quitting: 21.2  . Smokeless tobacco: Never Used  Substance Use Topics  . Alcohol use: Yes    Alcohol/week: 14.0 standard drinks    Types: 14 Glasses of wine per week  . Drug use: No  FAMILY HISTORY: Family History  Problem Relation Age of Onset  . Breast cancer Mother   . Hypertension Mother   . Hypertension Other   . Colon cancer Neg Hx    ROS: Review of Systems  Constitutional: Positive for malaise/fatigue. Negative for weight loss.  Eyes:       Wears glasses or contacts.  Respiratory: Positive for shortness of breath.   Cardiovascular: Negative for chest pain.       Positive for calf and leg pain with walking. Positive for leg cramping.  Musculoskeletal: Positive for joint pain and myalgias.  Skin: Positive for itching and rash.  Neurological: Negative for headaches.  Endo/Heme/Allergies:       Positive for polyphagia. Positive for hyperglycemia.   PHYSICAL EXAM: Blood pressure (!) 154/84, pulse 74, height 5\' 11"  (1.803 m), weight 259 lb (117.5 kg), SpO2 97 %. Body mass index is 36.12 kg/m. Physical Exam Vitals signs reviewed.  Constitutional:      Appearance: Normal appearance. He is obese.  HENT:     Head: Normocephalic and atraumatic.     Nose: Nose normal.  Eyes:     General: No scleral icterus.    Extraocular Movements: Extraocular movements intact.  Neck:     Musculoskeletal: Normal range of motion and neck supple.     Thyroid: No thyromegaly.     Comments: Negative for thyromegaly. Cardiovascular:      Rate and Rhythm: Normal rate and regular rhythm.  Pulmonary:     Effort: Pulmonary effort is normal. No respiratory distress.  Abdominal:     Palpations: Abdomen is soft.     Tenderness: There is no abdominal tenderness.     Comments: Positive for obesity.  Musculoskeletal:     Comments: ROM normal in all extremities.  Skin:    General: Skin is warm and dry.  Neurological:     Mental Status: He is alert and oriented to person, place, and time.     Coordination: Coordination normal.  Psychiatric:        Mood and Affect: Mood normal.        Behavior: Behavior normal.    RECENT LABS AND TESTS: BMET    Component Value Date/Time   NA 137 03/29/2018 0753   K 4.5 03/29/2018 0753   CL 102 03/29/2018 0753   CO2 26 03/29/2018 0753   GLUCOSE 109 (H) 03/29/2018 0753   BUN 19 03/29/2018 0753   CREATININE 0.95 03/29/2018 0753   CALCIUM 9.3 03/29/2018 0753   GFRNONAA >90 09/09/2014 0510   GFRAA >90 09/09/2014 0510   Lab Results  Component Value Date   HGBA1C 6.0 10/05/2017   No results found for: INSULIN CBC    Component Value Date/Time   WBC 6.2 03/29/2018 0753   RBC 4.29 03/29/2018 0753   HGB 15.6 03/29/2018 0753   HCT 45.7 03/29/2018 0753   PLT 164.0 03/29/2018 0753   MCV 106.4 (H) 03/29/2018 0753   MCH 33.9 09/09/2014 0510   MCHC 34.2 03/29/2018 0753   RDW 12.5 03/29/2018 0753   LYMPHSABS 1.7 03/29/2018 0753   MONOABS 0.8 03/29/2018 0753   EOSABS 0.2 03/29/2018 0753   BASOSABS 0.0 03/29/2018 0753   Iron/TIBC/Ferritin/ %Sat No results found for: IRON, TIBC, FERRITIN, IRONPCTSAT Lipid Panel     Component Value Date/Time   CHOL 181 03/29/2018 0753   TRIG 111.0 03/29/2018 0753   HDL 45.20 03/29/2018 0753   CHOLHDL 4 03/29/2018 0753   VLDL 22.2 03/29/2018 0753   LDLCALC  113 (H) 03/29/2018 0753   LDLDIRECT 146.2 10/07/2010 0814   Hepatic Function Panel     Component Value Date/Time   PROT 6.7 03/29/2018 0753   ALBUMIN 4.2 03/29/2018 0753   AST 35  03/29/2018 0753   ALT 58 (H) 03/29/2018 0753   ALKPHOS 43 03/29/2018 0753   BILITOT 0.7 03/29/2018 0753   BILIDIR 0.2 03/29/2018 0753      Component Value Date/Time   TSH 2.98 03/29/2018 0753   TSH 3.65 03/29/2017 0750   TSH 4.03 03/10/2016 0743   ECG  shows NSR with a rate of 73 BPM. INDIRECT CALORIMETER done today shows a VO2 of 351 and a REE of 2444.  His calculated basal metabolic rate is 8185 thus his basal metabolic rate is better than expected.  OBESITY BEHAVIORAL INTERVENTION VISIT  Today's visit was # 1   Starting weight: 259 lbs Starting date: 12/16/2018 Today's weight : Weight: 259 lb (117.5 kg)  Today's date: 12/16/2018 Total lbs lost to date: 0 At least 15 minutes were spent on discussing the following behavioral intervention visit.    12/16/2018  Height 5\' 11"  (1.803 m)  Weight 259 lb (117.5 kg)  BMI (Calculated) 36.14  BLOOD PRESSURE - SYSTOLIC 631  BLOOD PRESSURE - DIASTOLIC 84  Waist Measurement  50 inches   Body Fat % 36.2 %  Total Body Water (lbs) 114.6 lbs  RMR 2444    ASK: We discussed the diagnosis of obesity with [name] today and Gates agreed to give [place] permission to discuss obesity behavioral modification therapy today.  ASSESS: Jaesean has the diagnosis of obesity and his BMI today is 36.14. Aeson is in the action stage of change.   ADVISE: Laderrick was educated on the multiple health risks of obesity as well as the benefit of weight loss to improve his health. He was advised of the need for long term treatment and the importance of lifestyle modifications to improve his current health and to decrease his risk of future health problems.  AGREE: Multiple dietary modification options and treatment options were discussed and Wladyslaw agreed to follow the recommendations documented in the above note.  ARRANGE: Derrell was educated on the importance of frequent visits to treat obesity as outlined per CMS and USPSTF guidelines and agreed to schedule  his next follow up appointment today.  IMarcille Blanco, CMA, am acting as transcriptionist for Starlyn Skeans, MD  I have reviewed the above documentation for accuracy and completeness, and I agree with the above. -[name], MD

## 2018-12-17 LAB — COMPREHENSIVE METABOLIC PANEL
ALT: 45 IU/L — ABNORMAL HIGH (ref 0–44)
AST: 63 IU/L — ABNORMAL HIGH (ref 0–40)
Albumin/Globulin Ratio: 1.6 (ref 1.2–2.2)
Albumin: 4.2 g/dL (ref 3.8–4.8)
Alkaline Phosphatase: 67 IU/L (ref 39–117)
BUN/Creatinine Ratio: 15 (ref 10–24)
BUN: 14 mg/dL (ref 8–27)
Bilirubin Total: 0.6 mg/dL (ref 0.0–1.2)
CO2: 20 mmol/L (ref 20–29)
Calcium: 8.9 mg/dL (ref 8.6–10.2)
Chloride: 101 mmol/L (ref 96–106)
Creatinine, Ser: 0.93 mg/dL (ref 0.76–1.27)
GFR calc Af Amer: 99 mL/min/{1.73_m2} (ref >59)
GFR calc non Af Amer: 86 mL/min/{1.73_m2} (ref >59)
Globulin, Total: 2.7 g/dL (ref 1.5–4.5)
Glucose: 127 mg/dL — ABNORMAL HIGH (ref 65–99)
Potassium: 4.3 mmol/L (ref 3.5–5.2)
SODIUM: 139 mmol/L (ref 134–144)
Total Protein: 6.9 g/dL (ref 6.0–8.5)

## 2018-12-17 LAB — LIPID PANEL WITH LDL/HDL RATIO
Cholesterol, Total: 177 mg/dL (ref 100–199)
HDL: 45 mg/dL (ref >39)
LDL CALC: 110 mg/dL — AB (ref 0–99)
LDL/HDL RATIO: 2.4 ratio (ref 0.0–3.6)
Triglycerides: 111 mg/dL (ref 0–149)
VLDL Cholesterol Cal: 22 mg/dL (ref 5–40)

## 2018-12-17 LAB — T4, FREE: Free T4: 1.31 ng/dL (ref 0.82–1.77)

## 2018-12-17 LAB — CBC WITH DIFFERENTIAL
Basophils Absolute: 0 10*3/uL (ref 0.0–0.2)
Basos: 0 %
EOS (ABSOLUTE): 0.2 10*3/uL (ref 0.0–0.4)
Eos: 3 %
Hematocrit: 43.6 % (ref 37.5–51.0)
Hemoglobin: 15.1 g/dL (ref 13.0–17.7)
Immature Grans (Abs): 0 10*3/uL (ref 0.0–0.1)
Immature Granulocytes: 0 %
Lymphocytes Absolute: 1.2 10*3/uL (ref 0.7–3.1)
Lymphs: 15 %
MCH: 36 pg — ABNORMAL HIGH (ref 26.6–33.0)
MCHC: 34.6 g/dL (ref 31.5–35.7)
MCV: 104 fL — AB (ref 79–97)
Monocytes Absolute: 0.7 10*3/uL (ref 0.1–0.9)
Monocytes: 9 %
Neutrophils Absolute: 5.7 10*3/uL (ref 1.4–7.0)
Neutrophils: 73 %
RBC: 4.2 x10E6/uL (ref 4.14–5.80)
RDW: 11.4 % — ABNORMAL LOW (ref 11.6–15.4)
WBC: 7.8 10*3/uL (ref 3.4–10.8)

## 2018-12-17 LAB — T3: T3, Total: 124 ng/dL (ref 71–180)

## 2018-12-17 LAB — INSULIN, RANDOM: INSULIN: 45.8 u[IU]/mL — ABNORMAL HIGH (ref 2.6–24.9)

## 2018-12-17 LAB — VITAMIN D 25 HYDROXY (VIT D DEFICIENCY, FRACTURES): Vit D, 25-Hydroxy: 66.2 ng/mL (ref 30.0–100.0)

## 2018-12-17 LAB — HEMOGLOBIN A1C
Est. average glucose Bld gHb Est-mCnc: 123 mg/dL
Hgb A1c MFr Bld: 5.9 % — ABNORMAL HIGH (ref 4.8–5.6)

## 2018-12-17 LAB — TSH: TSH: 5.35 u[IU]/mL — ABNORMAL HIGH (ref 0.450–4.500)

## 2018-12-24 ENCOUNTER — Encounter (INDEPENDENT_AMBULATORY_CARE_PROVIDER_SITE_OTHER): Payer: Self-pay

## 2018-12-30 ENCOUNTER — Ambulatory Visit (INDEPENDENT_AMBULATORY_CARE_PROVIDER_SITE_OTHER): Payer: Self-pay | Admitting: Family Medicine

## 2018-12-31 ENCOUNTER — Encounter (INDEPENDENT_AMBULATORY_CARE_PROVIDER_SITE_OTHER): Payer: Self-pay

## 2018-12-31 ENCOUNTER — Encounter (INDEPENDENT_AMBULATORY_CARE_PROVIDER_SITE_OTHER): Payer: Self-pay | Admitting: Family Medicine

## 2018-12-31 ENCOUNTER — Ambulatory Visit (INDEPENDENT_AMBULATORY_CARE_PROVIDER_SITE_OTHER): Payer: Medicare HMO | Admitting: Family Medicine

## 2018-12-31 ENCOUNTER — Other Ambulatory Visit: Payer: Self-pay

## 2018-12-31 DIAGNOSIS — R7303 Prediabetes: Secondary | ICD-10-CM

## 2018-12-31 DIAGNOSIS — R945 Abnormal results of liver function studies: Secondary | ICD-10-CM

## 2018-12-31 DIAGNOSIS — Z6836 Body mass index (BMI) 36.0-36.9, adult: Secondary | ICD-10-CM

## 2018-12-31 DIAGNOSIS — R7989 Other specified abnormal findings of blood chemistry: Secondary | ICD-10-CM

## 2018-12-31 MED ORDER — METFORMIN HCL 500 MG PO TABS: 500.0000 mg | ORAL_TABLET | Freq: Every day | ORAL | 0 refills | Status: DC

## 2019-01-01 NOTE — Progress Notes (Addendum)
Office: [phone]  /  Fax: [phone] TeleHealth Visit:  Zachary Valdez has consented to this TeleHealth visit today via telephone. The patient is located at home, the provider is located at the [facility] office. The participants in this visit include the listed provider and patient.  I spent > than 50% of the 25 minute visit on counseling as documented in the note.    HPI:   Chief Complaint: OBESITY Zachary Valdez is here to discuss his progress with his obesity treatment plan. He is on the Category 3 plan with Category 4 microwave meals and is following his eating plan approximately 100 % of the time. He states he is walking 20 minutes 5 times per week. Zachary Valdez did well on the Category 3 plan. He is bored especially with dinner and he thinks he lost 7 to 8 pounds in the last 2 weeks. He felt his hunger became more of an issue after the 1st week.  We were unable to weigh the patient today for this TeleHealth visit. He feels as if he has lost weight since his last visit. He has lost 0 lbs since starting treatment with [place].  Pre-Diabetes Zachary Valdez has a diagnosis of pre-diabetes based on his elevated Hgb A1c, fasting glucose, and fasting Insulin. He was informed this puts him at greater risk of developing diabetes. He admits polyphagia that is worse in the afternoons. He is doing well overall on his diet prescription. He is not taking metformin currently and continues to work on diet and exercise to decrease risk of diabetes. Zachary Valdez was sent Pre-Diabetes/Insulin Resistance Firefighter through [place].  Elevated Liver Function Tests Zachary Valdez has mildly elevated liver function tests and is at high risk for non alcoholic fatty liver disease. He is on a statin and denies abdominal pain or jaundice.  ASSESSMENT AND PLAN:  Prediabetes - Plan: metFORMIN (GLUCOPHAGE) 500 MG tablet  Elevated LFTs  Class 2 severe obesity with serious comorbidity and body mass index (BMI) of 36.0 to 36.9  in adult, unspecified obesity type ([place])  PLAN:  Pre-Diabetes Zachary Valdez will continue to work on weight loss, exercise, and decreasing simple carbohydrates in his diet to help decrease the risk of diabetes. He was informed that eating too many simple carbohydrates or too many calories at one sitting increases the likelihood of GI side effects. Zachary Valdez agreed to start metformin 500 mg qAM #30 with no refills and a prescription was written today. Zachary Valdez agreed to follow up with [place] as directed to monitor his progress in 2 weeks.   Elevated Liver Function Tests Zachary Valdez agrees to continue his diet and weight loss. We will recheck labs in 3 months and he agrees to follow up in 2 weeks.  Obesity Zachary Valdez is currently in the action stage of change. As such, his goal is to continue with weight loss efforts. He has agreed to follow the Category 4 plan. Zachary Valdez has been instructed to work up to a goal of 150 minutes of combined cardio and strengthening exercise per week for weight loss and overall health benefits. We discussed the following Behavioral Modification Strategies today: increasing lean protein intake, decreasing simple carbohydrates, work on meal planning and easy cooking plans, keeping healthy foods in the home, and ways to avoid boredom eating.  Zachary Valdez has agreed to follow up with our clinic in 2 weeks. He was informed of the importance of frequent follow up visits to maximize his success with intensive lifestyle modifications for his multiple health conditions.  ALLERGIES: Allergies  Allergen Reactions  . Atacand [Candesartan Cilexetil]   . Atorvastatin     REACTION: achy  . Enalapril     cough  . Hydrocodone-Acetaminophen     REACTION: SOB, patient states drug made patient feel weird   . Losartan   . Tramadol     headache    MEDICATIONS: Current Outpatient Medications on File Prior to Visit  Medication Sig Dispense Refill  . aspirin EC 81 MG tablet Take 1 tablet (81 mg total) by mouth  daily. 100 tablet 3  . diclofenac (VOLTAREN) 50 MG EC tablet Take 1 tablet (50 mg total) by mouth 2 (two) times daily as needed. 180 tablet 2  . olmesartan (BENICAR) 40 MG tablet Take 40 mg by mouth daily.    . rosuvastatin (CRESTOR) 40 MG tablet Take 1 tablet (40 mg total) by mouth daily. (Patient taking differently: Take 40 mg by mouth. Take one tablet by mouth every other day) 90 tablet 3  . tadalafil (CIALIS) 20 MG tablet Take 1 tablet (20 mg total) by mouth daily as needed for erectile dysfunction. 30 tablet 3  . vitamin B-12 (CYANOCOBALAMIN) 1000 MCG tablet Take 1 tablet (1,000 mcg total) by mouth daily. 100 tablet 3  . Vitamin D, Ergocalciferol, (DRISDOL) 1.25 MG (50000 UT) CAPS capsule Take 1 capsule (50,000 Units total) by mouth every 7 (seven) days. 12 capsule 0   No current facility-administered medications on file prior to visit.     PAST MEDICAL HISTORY: Past Medical History:  Diagnosis Date  . Allergic rhinitis   . Anxiety   . GERD (gastroesophageal reflux disease)   . Hyperlipidemia   . Hyperplastic colon polyp 2006  . Hypertension   . OA (osteoarthritis)   . Obesity   . Sleep apnea    cpap-   . Ulcerative esophagitis 2006   Dr. Fuller Plan  . Vitamin B 12 deficiency     PAST SURGICAL HISTORY: Past Surgical History:  Procedure Laterality Date  . KNEE ARTHROSCOPY     Right  . TOTAL KNEE ARTHROPLASTY Left 09/07/2014   Procedure: LEFT TOTAL KNEE ARTHROPLASTY;  Surgeon: Gearlean Alf, MD;  Location: [facility];  Service: Orthopedics;  Laterality: Left;    SOCIAL HISTORY: Social History   Tobacco Use  . Smoking status: Former Smoker    Types: Cigarettes    Last attempt to quit: 10/02/1997    Years since quitting: 21.2  . Smokeless tobacco: Never Used  Substance Use Topics  . Alcohol use: Yes    Alcohol/week: 14.0 standard drinks    Types: 14 Glasses of wine per week  . Drug use: No    FAMILY HISTORY: Family History  Problem Relation Age of Onset  . Breast  cancer Mother   . Hypertension Mother   . Hypertension Other   . Colon cancer Neg Hx     ROS: Review of Systems  Gastrointestinal: Negative for abdominal pain.       Negative for jaundice.  Endo/Heme/Allergies:       Positive for polyphagia.    PHYSICAL EXAM: Pt in no acute distress  RECENT LABS AND TESTS: BMET    Component Value Date/Time   NA 139 12/16/2018 1100   K 4.3 12/16/2018 1100   CL 101 12/16/2018 1100   CO2 20 12/16/2018 1100   GLUCOSE 127 (H) 12/16/2018 1100   GLUCOSE 109 (H) 03/29/2018 0753   BUN 14 12/16/2018 1100   CREATININE 0.93 12/16/2018 1100   CALCIUM 8.9  12/16/2018 1100   GFRNONAA 86 12/16/2018 1100   GFRAA 99 12/16/2018 1100   Lab Results  Component Value Date   HGBA1C 5.9 (H) 12/16/2018   HGBA1C 6.0 10/05/2017   HGBA1C 6.0 03/29/2017   HGBA1C 6.0 03/10/2016   HGBA1C 6.4 12/06/2015   Lab Results  Component Value Date   INSULIN 45.8 (H) 12/16/2018   CBC    Component Value Date/Time   WBC 7.8 12/16/2018 1100   WBC 6.2 03/29/2018 0753   RBC 4.20 12/16/2018 1100   RBC 4.29 03/29/2018 0753   HGB 15.1 12/16/2018 1100   HCT 43.6 12/16/2018 1100   PLT 164.0 03/29/2018 0753   MCV 104 (H) 12/16/2018 1100   MCH 36.0 (H) 12/16/2018 1100   MCH 33.9 09/09/2014 0510   MCHC 34.6 12/16/2018 1100   MCHC 34.2 03/29/2018 0753   RDW 11.4 (L) 12/16/2018 1100   LYMPHSABS 1.2 12/16/2018 1100   MONOABS 0.8 03/29/2018 0753   EOSABS 0.2 12/16/2018 1100   BASOSABS 0.0 12/16/2018 1100   Iron/TIBC/Ferritin/ %Sat No results found for: IRON, TIBC, FERRITIN, IRONPCTSAT Lipid Panel     Component Value Date/Time   CHOL 177 12/16/2018 1100   TRIG 111 12/16/2018 1100   HDL 45 12/16/2018 1100   CHOLHDL 4 03/29/2018 0753   VLDL 22.2 03/29/2018 0753   LDLCALC 110 (H) 12/16/2018 1100   LDLDIRECT 146.2 10/07/2010 0814   Hepatic Function Panel     Component Value Date/Time   PROT 6.9 12/16/2018 1100   ALBUMIN 4.2 12/16/2018 1100   AST 63 (H) 12/16/2018  1100   ALT 45 (H) 12/16/2018 1100   ALKPHOS 67 12/16/2018 1100   BILITOT 0.6 12/16/2018 1100   BILIDIR 0.2 03/29/2018 0753      Component Value Date/Time   TSH 5.350 (H) 12/16/2018 1100   TSH 2.98 03/29/2018 0753   TSH 3.65 03/29/2017 0750   Results for ALDER, MURRI (MRN [id number]) as of 01/01/2019 07:05  Ref. Range 12/16/2018 11:00  Vitamin D, 25-Hydroxy Latest Ref Range: 30.0 - 100.0 ng/mL 66.2    I, Marcille Blanco, CMA, am acting as transcriptionist for Starlyn Skeans, MD I have reviewed the above documentation for accuracy and completeness, and I agree with the above. -[name], MD

## 2019-01-13 ENCOUNTER — Ambulatory Visit (INDEPENDENT_AMBULATORY_CARE_PROVIDER_SITE_OTHER): Payer: Medicare HMO | Admitting: Family Medicine

## 2019-01-13 ENCOUNTER — Other Ambulatory Visit: Payer: Self-pay

## 2019-01-13 DIAGNOSIS — I10 Essential (primary) hypertension: Secondary | ICD-10-CM | POA: Diagnosis not present

## 2019-01-13 DIAGNOSIS — Z6836 Body mass index (BMI) 36.0-36.9, adult: Secondary | ICD-10-CM | POA: Diagnosis not present

## 2019-01-13 DIAGNOSIS — E559 Vitamin D deficiency, unspecified: Secondary | ICD-10-CM | POA: Diagnosis not present

## 2019-01-13 MED ORDER — VITAMIN D (ERGOCALCIFEROL) 1.25 MG (50000 UNIT) PO CAPS: 50000.0000 [IU] | ORAL_CAPSULE | ORAL | 0 refills | Status: DC

## 2019-01-13 NOTE — Progress Notes (Addendum)
Office: [phone]  /  Fax: [phone] TeleHealth Visit:  Zachary Valdez has verbally consented to this TeleHealth visit today. The patient is located at home, the provider is located at the [facility] office. The participants in this visit include the listed provider and patient. Aidin was unable to use Webex today and the Telehealth visit was conducted via telephone. I spent > than 50% of the 25 minute visit on counseling as documented in the note.   HPI:   Chief Complaint: OBESITY Zachary Valdez is here to discuss his progress with his obesity treatment plan. He is on the Category 3 plan and is following his eating plan approximately 100 % of the time. He states he is walking 20 minutes 5 to 6 times per week. Zachary Valdez states that he continues to do well with his Category 3 plan and thinks he has lost another 4 to 5 pounds. He is getting bored with his plan and would like some recipe ideas.  We were unable to weigh the patient today for this TeleHealth visit. He feels as if he has lost weight since his last visit. He has lost 0 lbs since starting treatment with [place].  Vitamin D Deficiency Zachary Valdez has a diagnosis of vitamin D deficiency. He is currently stable on vit D. Zachary Valdez denies nausea, vomiting, or muscle weakness.  Hypertension Zachary Valdez is a 68 y.o. male with hypertension. Zachary Valdez's blood pressure has improved since starting his diet and exercise. His last blood pressure reading at home was 124/76 and he is also on Benicar. He is working on weight loss to help control his blood pressure with the goal of decreasing his risk of heart attack and stroke.   ASSESSMENT AND PLAN:  Essential hypertension  Vitamin D deficiency - Plan: Vitamin D, Ergocalciferol, (DRISDOL) 1.25 MG (50000 UT) CAPS capsule  Class 2 severe obesity with serious comorbidity and body mass index (BMI) of 36.0 to 36.9 in adult, unspecified obesity type ([place])  PLAN:  Vitamin D Deficiency Zachary Valdez was informed  that low vitamin D levels contribute to fatigue and are associated with obesity, breast, and colon cancer. Jerek agrees to continue to take prescription Vit D @50 ,000 IU every week #4 with no refills and will follow up for routine testing of vitamin D, at least 2-3 times per year. He was informed of the risk of over-replacement of vitamin D and agrees to not increase his dose unless he discusses this with [place] first. Zachary Valdez agrees to follow up in 2 weeks as directed.  Hypertension We discussed sodium restriction, working on healthy weight loss, and a regular exercise program as the means to achieve improved blood pressure control. We will continue to monitor his blood pressure as well as his progress with the above lifestyle modifications. He will continue his diet, exercise, weight loss, and medications as prescribed. He will watch for signs of hypotension as he continues his lifestyle modifications. Zachary Valdez agreed to keep checking his blood pressure at home and agreed to follow up as directed in 2 weeks.  Obesity Zachary Valdez is currently in the action stage of change. As such, his goal is to continue with weight loss efforts. He has agreed to follow the Category 3 plan. Zachary Valdez has been instructed to work up to a goal of 150 minutes of combined cardio and strengthening exercise per week for weight loss and overall health benefits. We discussed the following Behavioral Modification Strategies today: increasing vegetables, no skipping meals, better snacking choices, and work  on meal planning and easy cooking plans.  Zachary Valdez has agreed to follow up with our clinic in 2 weeks. He was informed of the importance of frequent follow up visits to maximize his success with intensive lifestyle modifications for his multiple health conditions.  ALLERGIES: Allergies  Allergen Reactions  . Atacand [Candesartan Cilexetil]   . Atorvastatin     REACTION: achy  . Enalapril     cough  . Hydrocodone-Acetaminophen      REACTION: SOB, patient states drug made patient feel weird   . Losartan   . Tramadol     headache    MEDICATIONS: Current Outpatient Medications on File Prior to Visit  Medication Sig Dispense Refill  . aspirin EC 81 MG tablet Take 1 tablet (81 mg total) by mouth daily. 100 tablet 3  . diclofenac (VOLTAREN) 50 MG EC tablet Take 1 tablet (50 mg total) by mouth 2 (two) times daily as needed. 180 tablet 2  . metFORMIN (GLUCOPHAGE) 500 MG tablet Take 1 tablet (500 mg total) by mouth daily with breakfast. 30 tablet 0  . olmesartan (BENICAR) 40 MG tablet Take 40 mg by mouth daily.    . rosuvastatin (CRESTOR) 40 MG tablet Take 1 tablet (40 mg total) by mouth daily. (Patient taking differently: Take 40 mg by mouth. Take one tablet by mouth every other day) 90 tablet 3  . tadalafil (CIALIS) 20 MG tablet Take 1 tablet (20 mg total) by mouth daily as needed for erectile dysfunction. 30 tablet 3  . vitamin B-12 (CYANOCOBALAMIN) 1000 MCG tablet Take 1 tablet (1,000 mcg total) by mouth daily. 100 tablet 3  . Vitamin D, Ergocalciferol, (DRISDOL) 1.25 MG (50000 UT) CAPS capsule Take 1 capsule (50,000 Units total) by mouth every 7 (seven) days. 12 capsule 0   No current facility-administered medications on file prior to visit.     PAST MEDICAL HISTORY: Past Medical History:  Diagnosis Date  . Allergic rhinitis   . Anxiety   . GERD (gastroesophageal reflux disease)   . Hyperlipidemia   . Hyperplastic colon polyp 2006  . Hypertension   . OA (osteoarthritis)   . Obesity   . Sleep apnea    cpap-   . Ulcerative esophagitis 2006   Dr. Fuller Plan  . Vitamin B 12 deficiency     PAST SURGICAL HISTORY: Past Surgical History:  Procedure Laterality Date  . KNEE ARTHROSCOPY     Right  . TOTAL KNEE ARTHROPLASTY Left 09/07/2014   Procedure: LEFT TOTAL KNEE ARTHROPLASTY;  Surgeon: Gearlean Alf, MD;  Location: [facility];  Service: Orthopedics;  Laterality: Left;    SOCIAL HISTORY: Social History   Tobacco  Use  . Smoking status: Former Smoker    Types: Cigarettes    Last attempt to quit: 10/02/1997    Years since quitting: 21.2  . Smokeless tobacco: Never Used  Substance Use Topics  . Alcohol use: Yes    Alcohol/week: 14.0 standard drinks    Types: 14 Glasses of wine per week  . Drug use: No    FAMILY HISTORY: Family History  Problem Relation Age of Onset  . Breast cancer Mother   . Hypertension Mother   . Hypertension Other   . Colon cancer Neg Hx     ROS: Review of Systems  Gastrointestinal: Negative for nausea and vomiting.  Musculoskeletal:       Negative for muscle weakness.    PHYSICAL EXAM: Pt in no acute distress  RECENT LABS AND TESTS: BMET  Component Value Date/Time   NA 139 12/16/2018 1100   K 4.3 12/16/2018 1100   CL 101 12/16/2018 1100   CO2 20 12/16/2018 1100   GLUCOSE 127 (H) 12/16/2018 1100   GLUCOSE 109 (H) 03/29/2018 0753   BUN 14 12/16/2018 1100   CREATININE 0.93 12/16/2018 1100   CALCIUM 8.9 12/16/2018 1100   GFRNONAA 86 12/16/2018 1100   GFRAA 99 12/16/2018 1100   Lab Results  Component Value Date   HGBA1C 5.9 (H) 12/16/2018   HGBA1C 6.0 10/05/2017   HGBA1C 6.0 03/29/2017   HGBA1C 6.0 03/10/2016   HGBA1C 6.4 12/06/2015   Lab Results  Component Value Date   INSULIN 45.8 (H) 12/16/2018   CBC    Component Value Date/Time   WBC 7.8 12/16/2018 1100   WBC 6.2 03/29/2018 0753   RBC 4.20 12/16/2018 1100   RBC 4.29 03/29/2018 0753   HGB 15.1 12/16/2018 1100   HCT 43.6 12/16/2018 1100   PLT 164.0 03/29/2018 0753   MCV 104 (H) 12/16/2018 1100   MCH 36.0 (H) 12/16/2018 1100   MCH 33.9 09/09/2014 0510   MCHC 34.6 12/16/2018 1100   MCHC 34.2 03/29/2018 0753   RDW 11.4 (L) 12/16/2018 1100   LYMPHSABS 1.2 12/16/2018 1100   MONOABS 0.8 03/29/2018 0753   EOSABS 0.2 12/16/2018 1100   BASOSABS 0.0 12/16/2018 1100   Iron/TIBC/Ferritin/ %Sat No results found for: IRON, TIBC, FERRITIN, IRONPCTSAT Lipid Panel     Component Value  Date/Time   CHOL 177 12/16/2018 1100   TRIG 111 12/16/2018 1100   HDL 45 12/16/2018 1100   CHOLHDL 4 03/29/2018 0753   VLDL 22.2 03/29/2018 0753   LDLCALC 110 (H) 12/16/2018 1100   LDLDIRECT 146.2 10/07/2010 0814   Hepatic Function Panel     Component Value Date/Time   PROT 6.9 12/16/2018 1100   ALBUMIN 4.2 12/16/2018 1100   AST 63 (H) 12/16/2018 1100   ALT 45 (H) 12/16/2018 1100   ALKPHOS 67 12/16/2018 1100   BILITOT 0.6 12/16/2018 1100   BILIDIR 0.2 03/29/2018 0753      Component Value Date/Time   TSH 5.350 (H) 12/16/2018 1100   TSH 2.98 03/29/2018 0753   TSH 3.65 03/29/2017 0750   Results for KINCADE, GRANBERG (MRN [id number]) as of 01/13/2019 16:21  Ref. Range 12/16/2018 11:00  Vitamin D, 25-Hydroxy Latest Ref Range: 30.0 - 100.0 ng/mL 66.2     I, Marcille Blanco, CMA, am acting as transcriptionist for Starlyn Skeans, MD I have reviewed the above documentation for accuracy and completeness, and I agree with the above. -[name], MD

## 2019-01-17 ENCOUNTER — Encounter (INDEPENDENT_AMBULATORY_CARE_PROVIDER_SITE_OTHER): Payer: Self-pay | Admitting: Family Medicine

## 2019-01-19 NOTE — Telephone Encounter (Signed)
Please review and advise.

## 2019-01-27 ENCOUNTER — Encounter: Payer: Self-pay | Admitting: Internal Medicine

## 2019-01-27 ENCOUNTER — Ambulatory Visit (INDEPENDENT_AMBULATORY_CARE_PROVIDER_SITE_OTHER): Payer: Medicare HMO | Admitting: Internal Medicine

## 2019-01-27 ENCOUNTER — Other Ambulatory Visit (INDEPENDENT_AMBULATORY_CARE_PROVIDER_SITE_OTHER): Payer: Self-pay | Admitting: Family Medicine

## 2019-01-27 DIAGNOSIS — R739 Hyperglycemia, unspecified: Secondary | ICD-10-CM | POA: Diagnosis not present

## 2019-01-27 DIAGNOSIS — Z6835 Body mass index (BMI) 35.0-35.9, adult: Secondary | ICD-10-CM | POA: Diagnosis not present

## 2019-01-27 DIAGNOSIS — I10 Essential (primary) hypertension: Secondary | ICD-10-CM

## 2019-01-27 DIAGNOSIS — E785 Hyperlipidemia, unspecified: Secondary | ICD-10-CM

## 2019-01-27 DIAGNOSIS — R7303 Prediabetes: Secondary | ICD-10-CM

## 2019-01-27 NOTE — Assessment & Plan Note (Signed)
On Crestor 

## 2019-01-27 NOTE — Assessment & Plan Note (Signed)
Continue with weight loss.  Continue Zachary Valdez

## 2019-01-27 NOTE — Assessment & Plan Note (Signed)
Is doing well with weight loss.

## 2019-01-27 NOTE — Assessment & Plan Note (Signed)
Continue with aspirin, Crestor and daily walks

## 2019-01-27 NOTE — Assessment & Plan Note (Signed)
Continue with weight loss.  He will try to restart metformin at one quarter or one half of a tablet per day.

## 2019-01-27 NOTE — Progress Notes (Signed)
Virtual Visit via Telephone Note  I connected with Zachary Valdez on 01/27/19 at  1:20 PM EDT by telephone and verified that I am speaking with the correct person using two identifiers.   I discussed the limitations, risks, security and privacy concerns of performing an evaluation and management service by telephone and the availability of in person appointments. I also discussed with the patient that there may be a patient responsible charge related to this service. The patient expressed understanding and agreed to proceed.   History of Present Illness:   Zachary Valdez is complaining of side effects with metformin.  His father gave him fatigue and headaches.  He stopped metformin and started to feel better.  However, he became more constipated.  He continues to have daily walks.  His blood pressure is good.  He lost 12 pounds in 6 weeks with Dr. Leafy Ro. Follow-up hypertension, glucose intolerance, dyslipidemia Observations/Objective:  Zachary Valdez looks well Assessment and Plan: See plan  Follow Up Instructions:    I discussed the assessment and treatment plan with the patient. The patient was provided an opportunity to ask questions and all were answered. The patient agreed with the plan and demonstrated an understanding of the instructions.   The patient was advised to call back or seek an in-person evaluation if the symptoms worsen or if the condition fails to improve as anticipated.  I provided 20 minutes of non-face-to-face time during this encounter.   Walker Kehr, MD

## 2019-01-28 ENCOUNTER — Encounter (INDEPENDENT_AMBULATORY_CARE_PROVIDER_SITE_OTHER): Payer: Self-pay | Admitting: Family Medicine

## 2019-01-28 NOTE — Telephone Encounter (Signed)
Please advise pt has an appt on 02/03/19

## 2019-02-03 ENCOUNTER — Ambulatory Visit (INDEPENDENT_AMBULATORY_CARE_PROVIDER_SITE_OTHER): Payer: Medicare HMO | Admitting: Family Medicine

## 2019-02-03 ENCOUNTER — Encounter (INDEPENDENT_AMBULATORY_CARE_PROVIDER_SITE_OTHER): Payer: Self-pay | Admitting: Family Medicine

## 2019-02-03 ENCOUNTER — Other Ambulatory Visit: Payer: Self-pay

## 2019-02-03 DIAGNOSIS — R7303 Prediabetes: Secondary | ICD-10-CM | POA: Diagnosis not present

## 2019-02-03 DIAGNOSIS — E559 Vitamin D deficiency, unspecified: Secondary | ICD-10-CM

## 2019-02-03 DIAGNOSIS — Z6836 Body mass index (BMI) 36.0-36.9, adult: Secondary | ICD-10-CM

## 2019-02-03 MED ORDER — METFORMIN HCL 500 MG PO TABS: 500.0000 mg | ORAL_TABLET | Freq: Every day | ORAL | 0 refills | Status: DC

## 2019-02-03 MED ORDER — VITAMIN D (ERGOCALCIFEROL) 1.25 MG (50000 UNIT) PO CAPS: 50000.0000 [IU] | ORAL_CAPSULE | ORAL | 0 refills | Status: DC

## 2019-02-03 NOTE — Progress Notes (Signed)
Office: [phone]  /  Fax: [phone] TeleHealth Visit:  Zachary Valdez has verbally consented to this TeleHealth visit today. The patient is located at home, the provider is located at the [facility] office. The participants in this visit include the listed provider and patient. The visit was conducted today via Face Time.  HPI:   Chief Complaint: OBESITY Zachary Valdez is here to discuss his progress with his obesity treatment plan. He is on the Category 3 plan and is following his eating plan approximately 65 % of the time. He states he is walking 25 minutes for 5 times per week. Zachary Valdez states that he is doing well with his diet prescription overall and is losing weight. His hunger is controlled and he has reduced his food portions to less than we recommend, especially for dinner. He has started walking most days for exercise, but at a slow pace due to his knee pain.   We were unable to weigh the patient today for this TeleHealth visit. He feels as if he has lost weight since his last visit. He has lost 0 lbs since starting treatment with [place].  Vitamin D Deficiency Zachary Valdez has a diagnosis of vitamin D deficiency. He is currently stable on vit D, but is not yet at goal. Zachary Valdez denies nausea, vomiting, or muscle weakness.  Pre-Diabetes Zachary Valdez has a diagnosis of pre-diabetes based on his elevated Hgb A1c and was informed this puts him at greater risk of developing diabetes. He feels that he is doing well on his diet prescription and tolerating metformin well. He feels the metformin is causing him to have more vivid dreams, but they do not bother him. Zachary Valdez continues to work on diet and exercise to decrease risk of diabetes. He denies nausea, vomiting, or hypoglycemia.   ASSESSMENT AND PLAN:  Prediabetes - Plan: metFORMIN (GLUCOPHAGE) 500 MG tablet  Vitamin D deficiency - Plan: Vitamin D, Ergocalciferol, (DRISDOL) 1.25 MG (50000 UT) CAPS capsule  Class 2 severe obesity with serious  comorbidity and body mass index (BMI) of 36.0 to 36.9 in adult, unspecified obesity type (Zachary Valdez)  PLAN:  Vitamin D Deficiency Zachary Valdez was informed that low vitamin D levels contribute to fatigue and are associated with obesity, breast, and colon cancer. Zachary Valdez agrees to continue to take prescription Vit D @50 ,000 IU every week #4 with no refills and will follow up for routine testing of vitamin D, at least 2-3 times per year. He was informed of the risk of over-replacement of vitamin D and agrees to not increase his dose unless he discusses this with [place] first. Zachary Valdez agrees to follow up in 2 weeks as directed.  Pre-Diabetes Zachary Valdez will continue to work on weight loss, exercise, and decreasing simple carbohydrates in his diet to help decrease the risk of diabetes. He was informed that eating too many simple carbohydrates or too many calories at one sitting increases the likelihood of GI side effects. Zachary Valdez agreed to continue metformin 500 mg qAM #30 with no refills and a prescription was written today. Zachary Valdez agreed to follow up with [place] as directed to monitor his progress in 2 weeks.   Obesity Zachary Valdez is currently in the action stage of change. As such, his goal is to continue with weight loss efforts. He has agreed to follow the Category 3 plan. Zachary Valdez has been instructed to continue walking as before 5 times per week. We discussed the following Behavioral Modification Strategies today: work on meal planning and easy cooking plans, keeping healthy  foods in the home, and ways to avoid boredom eating.  Zachary Valdez has agreed to follow up with our clinic in 2 weeks. He was informed of the importance of frequent follow up visits to maximize his success with intensive lifestyle modifications for his multiple health conditions.  ALLERGIES: Allergies  Allergen Reactions  . Atacand [Candesartan Cilexetil]   . Atorvastatin     REACTION: achy  . Enalapril     cough  . Hydrocodone-Acetaminophen     REACTION: SOB,  patient states drug made patient feel weird   . Losartan   . Tramadol     headache    MEDICATIONS: Current Outpatient Medications on File Prior to Visit  Medication Sig Dispense Refill  . aspirin EC 81 MG tablet Take 1 tablet (81 mg total) by mouth daily. 100 tablet 3  . diclofenac (VOLTAREN) 50 MG EC tablet Take 1 tablet (50 mg total) by mouth 2 (two) times daily as needed. 180 tablet 2  . metFORMIN (GLUCOPHAGE) 500 MG tablet Take 1 tablet (500 mg total) by mouth daily with breakfast. 30 tablet 0  . olmesartan (BENICAR) 40 MG tablet Take 40 mg by mouth daily.    . rosuvastatin (CRESTOR) 40 MG tablet Take 1 tablet (40 mg total) by mouth daily. (Patient taking differently: Take 40 mg by mouth. Take one tablet by mouth every other day) 90 tablet 3  . tadalafil (CIALIS) 20 MG tablet Take 1 tablet (20 mg total) by mouth daily as needed for erectile dysfunction. 30 tablet 3  . vitamin B-12 (CYANOCOBALAMIN) 1000 MCG tablet Take 1 tablet (1,000 mcg total) by mouth daily. 100 tablet 3  . Vitamin D, Ergocalciferol, (DRISDOL) 1.25 MG (50000 UT) CAPS capsule Take 1 capsule (50,000 Units total) by mouth every 7 (seven) days. 4 capsule 0   No current facility-administered medications on file prior to visit.     PAST MEDICAL HISTORY: Past Medical History:  Diagnosis Date  . Allergic rhinitis   . Anxiety   . GERD (gastroesophageal reflux disease)   . Hyperlipidemia   . Hyperplastic colon polyp 2006  . Hypertension   . OA (osteoarthritis)   . Obesity   . Sleep apnea    cpap-   . Ulcerative esophagitis 2006   Dr. Fuller Plan  . Vitamin B 12 deficiency     PAST SURGICAL HISTORY: Past Surgical History:  Procedure Laterality Date  . KNEE ARTHROSCOPY     Right  . TOTAL KNEE ARTHROPLASTY Left 09/07/2014   Procedure: LEFT TOTAL KNEE ARTHROPLASTY;  Surgeon: Gearlean Alf, MD;  Location: [facility];  Service: Orthopedics;  Laterality: Left;    SOCIAL HISTORY: Social History   Tobacco Use  . Smoking  status: Former Smoker    Types: Cigarettes    Last attempt to quit: 10/02/1997    Years since quitting: 21.3  . Smokeless tobacco: Never Used  Substance Use Topics  . Alcohol use: Yes    Alcohol/week: 14.0 standard drinks    Types: 14 Glasses of wine per week  . Drug use: No    FAMILY HISTORY: Family History  Problem Relation Age of Onset  . Breast cancer Mother   . Hypertension Mother   . Hypertension Other   . Colon cancer Neg Hx     ROS: Review of Systems  Gastrointestinal: Negative for nausea and vomiting.  Musculoskeletal:       Negative for muscle weakness.  Endo/Heme/Allergies:       Negative for hypoglycemia.  PHYSICAL EXAM: Pt in no acute distress  RECENT LABS AND TESTS: BMET    Component Value Date/Time   NA 139 12/16/2018 1100   K 4.3 12/16/2018 1100   CL 101 12/16/2018 1100   CO2 20 12/16/2018 1100   GLUCOSE 127 (H) 12/16/2018 1100   GLUCOSE 109 (H) 03/29/2018 0753   BUN 14 12/16/2018 1100   CREATININE 0.93 12/16/2018 1100   CALCIUM 8.9 12/16/2018 1100   GFRNONAA 86 12/16/2018 1100   GFRAA 99 12/16/2018 1100   Lab Results  Component Value Date   HGBA1C 5.9 (H) 12/16/2018   HGBA1C 6.0 10/05/2017   HGBA1C 6.0 03/29/2017   HGBA1C 6.0 03/10/2016   HGBA1C 6.4 12/06/2015   Lab Results  Component Value Date   INSULIN 45.8 (H) 12/16/2018   CBC    Component Value Date/Time   WBC 7.8 12/16/2018 1100   WBC 6.2 03/29/2018 0753   RBC 4.20 12/16/2018 1100   RBC 4.29 03/29/2018 0753   HGB 15.1 12/16/2018 1100   HCT 43.6 12/16/2018 1100   PLT 164.0 03/29/2018 0753   MCV 104 (H) 12/16/2018 1100   MCH 36.0 (H) 12/16/2018 1100   MCH 33.9 09/09/2014 0510   MCHC 34.6 12/16/2018 1100   MCHC 34.2 03/29/2018 0753   RDW 11.4 (L) 12/16/2018 1100   LYMPHSABS 1.2 12/16/2018 1100   MONOABS 0.8 03/29/2018 0753   EOSABS 0.2 12/16/2018 1100   BASOSABS 0.0 12/16/2018 1100   Iron/TIBC/Ferritin/ %Sat No results found for: IRON, TIBC, FERRITIN, IRONPCTSAT  Lipid Panel     Component Value Date/Time   CHOL 177 12/16/2018 1100   TRIG 111 12/16/2018 1100   HDL 45 12/16/2018 1100   CHOLHDL 4 03/29/2018 0753   VLDL 22.2 03/29/2018 0753   LDLCALC 110 (H) 12/16/2018 1100   LDLDIRECT 146.2 10/07/2010 0814   Hepatic Function Panel     Component Value Date/Time   PROT 6.9 12/16/2018 1100   ALBUMIN 4.2 12/16/2018 1100   AST 63 (H) 12/16/2018 1100   ALT 45 (H) 12/16/2018 1100   ALKPHOS 67 12/16/2018 1100   BILITOT 0.6 12/16/2018 1100   BILIDIR 0.2 03/29/2018 0753      Component Value Date/Time   TSH 5.350 (H) 12/16/2018 1100   TSH 2.98 03/29/2018 0753   TSH 3.65 03/29/2017 0750   Results for DEARIS, DANIS (MRN [id number]) as of 02/03/2019 12:25  Ref. Range 12/16/2018 11:00  Vitamin D, 25-Hydroxy Latest Ref Range: 30.0 - 100.0 ng/mL 66.2     I, Marcille Blanco, CMA, am acting as transcriptionist for Starlyn Skeans, MD I have reviewed the above documentation for accuracy and completeness, and I agree with the above. -[name], MD

## 2019-02-12 DIAGNOSIS — G4733 Obstructive sleep apnea (adult) (pediatric): Secondary | ICD-10-CM | POA: Diagnosis not present

## 2019-02-17 ENCOUNTER — Ambulatory Visit (INDEPENDENT_AMBULATORY_CARE_PROVIDER_SITE_OTHER): Payer: Medicare HMO | Admitting: Family Medicine

## 2019-02-17 ENCOUNTER — Other Ambulatory Visit: Payer: Self-pay

## 2019-02-17 ENCOUNTER — Encounter (INDEPENDENT_AMBULATORY_CARE_PROVIDER_SITE_OTHER): Payer: Self-pay | Admitting: Family Medicine

## 2019-02-17 DIAGNOSIS — I10 Essential (primary) hypertension: Secondary | ICD-10-CM | POA: Diagnosis not present

## 2019-02-17 DIAGNOSIS — Z711 Person with feared health complaint in whom no diagnosis is made: Secondary | ICD-10-CM

## 2019-02-17 DIAGNOSIS — Z6836 Body mass index (BMI) 36.0-36.9, adult: Secondary | ICD-10-CM | POA: Diagnosis not present

## 2019-02-18 NOTE — Progress Notes (Signed)
Office: [phone]  /  Fax: [phone] TeleHealth Visit:  [name] has verbally consented to this TeleHealth visit today. The patient is located at home, the provider is located at the [facility] office. The participants in this visit include the listed provider and patient. The visit was conducted today via [email or web address].  HPI:   Chief Complaint: OBESITY Izel is here to discuss his progress with his obesity treatment plan. He is on the Category 3 plan and is following his eating plan approximately 85 to 90 % of the time. He states he is exercising 0 minutes 0 times per week. Breck feels that he is doing well maintaining his weight, but is disappointed that he hasn't lost further on his Category 3 plan. He had been cutting back on his protein and vegetables for the last month as he hasn't been that hungry.  We were unable to weigh the patient today for this TeleHealth visit. He feels as if he has maintained weight since his last visit. He has lost 0 lbs since starting treatment with [place].  Hypertension LYNNWOOD BECKFORD is a 68 y.o. male with hypertension. Bruceis checking his blood pressure at home and his readings have been in the 120/70's range. He is on Benicar and is working on weight loss to help control his blood pressure with the goal of decreasing his risk of heart attack and stroke. Waris denies chest pain, headache, or dizziness.  Worried Well Jaxiel has questions about his risk of hospitalization if he contracts COVID19 and if he needs to wear a face mask when he is out and about.  ASSESSMENT AND PLAN:  Essential hypertension  Worried well  Class 2 severe obesity with serious comorbidity and body mass index (BMI) of 36.0 to 36.9 in adult, unspecified obesity type ([place])  PLAN:  Hypertension We discussed sodium restriction, working on healthy weight loss, and a regular exercise program as the means to achieve improved blood pressure control. We will continue to  monitor his blood pressure as well as his progress with the above lifestyle modifications. He will continue his medications and diet as prescribed and will watch for signs of hypotension as he continues his lifestyle modifications. We will continue to monitor his progress. Ayyub agreed with this plan and agreed to follow up as directed in 2 weeks.  Worried Well Siah was advised that he has moderate to high risk of hospitalization and that in addition to hand washing and 6 foot social distancing, he should wear a face mask when shopping or when he is around other people to reduce the risk of COVID19. We discussed symptoms of COVID19 and we discussed the possible need for sequestration and how to deal with this if it happens. Pt offered guidance and reassurance.  Obesity Hildred is currently in the action stage of change. As such, his goal is to continue with weight loss efforts. He has agreed to follow the Category 3 plan and keep a food journal with 500 to 650 calories and 45+ grams of protein for supper. Terius has been instructed to work up to a goal of 150 minutes of combined cardio and strengthening exercise per week for weight loss and overall health benefits. We discussed the following Behavioral Modification Strategies today: increasing lean protein intake, no skipping meals, and  better snacking choices. [name] was educated on the importance of eating enough of the healthy lean protein and vegetables to prevent a decrease in resting metabolic rate.  [place] has  agreed to follow up with our clinic in 2 weeks. He was informed of the importance of frequent follow up visits to maximize his success with intensive lifestyle modifications for his multiple health conditions.  ALLERGIES: Allergies  Allergen Reactions  . Atacand [Candesartan Cilexetil]   . Atorvastatin     REACTION: achy  . Enalapril     cough  . Hydrocodone-Acetaminophen     REACTION: SOB, patient states drug made patient feel  weird   . Losartan   . Tramadol     headache    MEDICATIONS: Current Outpatient Medications on File Prior to Visit  Medication Sig Dispense Refill  . aspirin EC 81 MG tablet Take 1 tablet (81 mg total) by mouth daily. 100 tablet 3  . diclofenac (VOLTAREN) 50 MG EC tablet Take 1 tablet (50 mg total) by mouth 2 (two) times daily as needed. 180 tablet 2  . metFORMIN (GLUCOPHAGE) 500 MG tablet Take 1 tablet (500 mg total) by mouth daily with breakfast. 30 tablet 0  . olmesartan (BENICAR) 40 MG tablet Take 40 mg by mouth daily.    . rosuvastatin (CRESTOR) 40 MG tablet Take 1 tablet (40 mg total) by mouth daily. (Patient taking differently: Take 40 mg by mouth. Take one tablet by mouth every other day) 90 tablet 3  . tadalafil (CIALIS) 20 MG tablet Take 1 tablet (20 mg total) by mouth daily as needed for erectile dysfunction. 30 tablet 3  . vitamin B-12 (CYANOCOBALAMIN) 1000 MCG tablet Take 1 tablet (1,000 mcg total) by mouth daily. 100 tablet 3  . Vitamin D, Ergocalciferol, (DRISDOL) 1.25 MG (50000 UT) CAPS capsule Take 1 capsule (50,000 Units total) by mouth every 7 (seven) days. 4 capsule 0   No current facility-administered medications on file prior to visit.     PAST MEDICAL HISTORY: Past Medical History:  Diagnosis Date  . Allergic rhinitis   . Anxiety   . GERD (gastroesophageal reflux disease)   . Hyperlipidemia   . Hyperplastic colon polyp 2006  . Hypertension   . OA (osteoarthritis)   . Obesity   . Sleep apnea    cpap-   . Ulcerative esophagitis 2006   Dr. Fuller Plan  . Vitamin B 12 deficiency     PAST SURGICAL HISTORY: Past Surgical History:  Procedure Laterality Date  . KNEE ARTHROSCOPY     Right  . TOTAL KNEE ARTHROPLASTY Left 09/07/2014   Procedure: LEFT TOTAL KNEE ARTHROPLASTY;  Surgeon: Gearlean Alf, MD;  Location: [facility];  Service: Orthopedics;  Laterality: Left;    SOCIAL HISTORY: Social History   Tobacco Use  . Smoking status: Former Smoker    Types:  Cigarettes    Last attempt to quit: 10/02/1997    Years since quitting: 21.3  . Smokeless tobacco: Never Used  Substance Use Topics  . Alcohol use: Yes    Alcohol/week: 14.0 standard drinks    Types: 14 Glasses of wine per week  . Drug use: No    FAMILY HISTORY: Family History  Problem Relation Age of Onset  . Breast cancer Mother   . Hypertension Mother   . Hypertension Other   . Colon cancer Neg Hx     ROS: Review of Systems  Cardiovascular: Negative for chest pain.  Neurological: Negative for dizziness and headaches.    PHYSICAL EXAM: Pt in no acute distress  RECENT LABS AND TESTS: BMET    Component Value Date/Time   NA 139 12/16/2018 1100   K 4.3 12/16/2018  1100   CL 101 12/16/2018 1100   CO2 20 12/16/2018 1100   GLUCOSE 127 (H) 12/16/2018 1100   GLUCOSE 109 (H) 03/29/2018 0753   BUN 14 12/16/2018 1100   CREATININE 0.93 12/16/2018 1100   CALCIUM 8.9 12/16/2018 1100   GFRNONAA 86 12/16/2018 1100   GFRAA 99 12/16/2018 1100   Lab Results  Component Value Date   HGBA1C 5.9 (H) 12/16/2018   HGBA1C 6.0 10/05/2017   HGBA1C 6.0 03/29/2017   HGBA1C 6.0 03/10/2016   HGBA1C 6.4 12/06/2015   Lab Results  Component Value Date   INSULIN 45.8 (H) 12/16/2018   CBC    Component Value Date/Time   WBC 7.8 12/16/2018 1100   WBC 6.2 03/29/2018 0753   RBC 4.20 12/16/2018 1100   RBC 4.29 03/29/2018 0753   HGB 15.1 12/16/2018 1100   HCT 43.6 12/16/2018 1100   PLT 164.0 03/29/2018 0753   MCV 104 (H) 12/16/2018 1100   MCH 36.0 (H) 12/16/2018 1100   MCH 33.9 09/09/2014 0510   MCHC 34.6 12/16/2018 1100   MCHC 34.2 03/29/2018 0753   RDW 11.4 (L) 12/16/2018 1100   LYMPHSABS 1.2 12/16/2018 1100   MONOABS 0.8 03/29/2018 0753   EOSABS 0.2 12/16/2018 1100   BASOSABS 0.0 12/16/2018 1100   Iron/TIBC/Ferritin/ %Sat No results found for: IRON, TIBC, FERRITIN, IRONPCTSAT Lipid Panel     Component Value Date/Time   CHOL 177 12/16/2018 1100   TRIG 111 12/16/2018 1100    HDL 45 12/16/2018 1100   CHOLHDL 4 03/29/2018 0753   VLDL 22.2 03/29/2018 0753   LDLCALC 110 (H) 12/16/2018 1100   LDLDIRECT 146.2 10/07/2010 0814   Hepatic Function Panel     Component Value Date/Time   PROT 6.9 12/16/2018 1100   ALBUMIN 4.2 12/16/2018 1100   AST 63 (H) 12/16/2018 1100   ALT 45 (H) 12/16/2018 1100   ALKPHOS 67 12/16/2018 1100   BILITOT 0.6 12/16/2018 1100   BILIDIR 0.2 03/29/2018 0753      Component Value Date/Time   TSH 5.350 (H) 12/16/2018 1100   TSH 2.98 03/29/2018 0753   TSH 3.65 03/29/2017 0750   Results for LUCIANO, CINQUEMANI (MRN [id number]) as of 02/18/2019 16:17  Ref. Range 12/16/2018 11:00  Vitamin D, 25-Hydroxy Latest Ref Range: 30.0 - 100.0 ng/mL 66.2     I, Marcille Blanco, CMA, am acting as transcriptionist for Starlyn Skeans, MD I have reviewed the above documentation for accuracy and completeness, and I agree with the above. -[name], MD

## 2019-02-19 ENCOUNTER — Ambulatory Visit: Payer: Medicare HMO | Admitting: Family Medicine

## 2019-03-03 ENCOUNTER — Other Ambulatory Visit: Payer: Self-pay | Admitting: Internal Medicine

## 2019-03-03 ENCOUNTER — Other Ambulatory Visit (INDEPENDENT_AMBULATORY_CARE_PROVIDER_SITE_OTHER): Payer: Self-pay | Admitting: Family Medicine

## 2019-03-03 DIAGNOSIS — R7303 Prediabetes: Secondary | ICD-10-CM

## 2019-03-05 ENCOUNTER — Encounter (INDEPENDENT_AMBULATORY_CARE_PROVIDER_SITE_OTHER): Payer: Self-pay | Admitting: Family Medicine

## 2019-03-05 ENCOUNTER — Ambulatory Visit (INDEPENDENT_AMBULATORY_CARE_PROVIDER_SITE_OTHER): Payer: Medicare HMO | Admitting: Family Medicine

## 2019-03-05 ENCOUNTER — Other Ambulatory Visit: Payer: Self-pay

## 2019-03-05 DIAGNOSIS — R7303 Prediabetes: Secondary | ICD-10-CM | POA: Diagnosis not present

## 2019-03-05 DIAGNOSIS — Z6836 Body mass index (BMI) 36.0-36.9, adult: Secondary | ICD-10-CM

## 2019-03-05 DIAGNOSIS — E559 Vitamin D deficiency, unspecified: Secondary | ICD-10-CM | POA: Diagnosis not present

## 2019-03-05 MED ORDER — VITAMIN D (ERGOCALCIFEROL) 1.25 MG (50000 UNIT) PO CAPS: 50000.0000 [IU] | ORAL_CAPSULE | ORAL | 0 refills | Status: DC

## 2019-03-05 MED ORDER — METFORMIN HCL 500 MG PO TABS: 500.0000 mg | ORAL_TABLET | Freq: Every day | ORAL | 0 refills | Status: DC

## 2019-03-05 NOTE — Progress Notes (Signed)
Office: [phone]  /  Fax: [phone] TeleHealth Visit:  Zachary Valdez has verbally consented to this TeleHealth visit today. The patient is located at home, the provider is located at the [facility] office. The participants in this visit include the listed provider and patient. The visit was conducted today via doxy.me.  HPI:   Chief Complaint: OBESITY Zachary Valdez is here to discuss his progress with his obesity treatment plan. He is on the keep a food journal with 500-650 calories and 45+ grams of protein at supper daily and follow the Category 3 plan and is following his eating plan approximately 75 % of the time. He states he is using a push lawnmower to mow the yard once a week. [name] feels he has done well with weight loss and has lost another 2-3 lbs. He is exercising outdoors doing yard work, gardening, and building things in his workshop. He states his hunger is controlled, but he is getting bored with breakfast.  We were unable to weigh the patient today for this TeleHealth visit. He feels as if he has lost 2-3 lbs since his last visit. He has lost 0 lbs since starting treatment with [place].  Vitamin D Deficiency Zachary Valdez has a diagnosis of vitamin D deficiency. He is currently taking prescription Vit D. Last Vit D level was at goal. He denies nausea, vomiting or muscle weakness.  Pre-Diabetes Zachary Valdez has a diagnosis of pre-diabetes based on his elevated Hgb A1c 5.9 and was informed this puts him at greater risk of developing diabetes. He is stable on metformin, and denies nausea, vomiting, or hypoglycemia. He is doing well on his diet and with weight loss to decrease risk of diabetes.  ASSESSMENT AND PLAN:  Vitamin D deficiency - Plan: Vitamin D, Ergocalciferol, (DRISDOL) 1.25 MG (50000 UT) CAPS capsule  Prediabetes - Plan: metFORMIN (GLUCOPHAGE) 500 MG tablet  Class 2 severe obesity with serious comorbidity and body mass index (BMI) of 36.0 to 36.9 in adult, unspecified  obesity type ([place])  PLAN:  Vitamin D Deficiency Zachary Valdez was informed that low vitamin D levels contributes to fatigue and are associated with obesity, breast, and colon cancer. Zachary Valdez agrees to continue taking prescription Vit D @50 ,000 IU every week #4 and we will refill for 1 month. He will follow up for routine testing of vitamin D, at least 2-3 times per year. He was informed of the risk of over-replacement of vitamin D and agrees to not increase his dose unless he discusses this with [place] first. We will recheck labs in 1 month. Viral agrees to follow up with our clinic in 2 weeks.  Pre-Diabetes Zachary Valdez will continue to work on weight loss, exercise, and decreasing simple carbohydrates in his diet to help decrease the risk of diabetes. We dicussed metformin including benefits and risks. He was informed that eating too many simple carbohydrates or too many calories at one sitting increases the likelihood of GI side effects. Zachary Valdez agrees to continue taking metformin 500 mg q AM #30 and we will refill for 1 month. We will recheck labs in 1 month. Zachary Valdez agrees to follow up with our clinic in 2 weeks as directed to monitor his progress.  Obesity Zachary Valdez is currently in the action stage of change. As such, his goal is to continue with weight loss efforts He has agreed to follow the Category 3 plan, we will send Category 3 breakfast options via MyChart. Zachary Valdez has been instructed to work up to a goal of 150  minutes of combined cardio and strengthening exercise per week for weight loss and overall health benefits. We discussed the following Behavioral Modification Strategies today: increasing lean protein intake, decreasing simple carbohydrates  and work on meal planning and easy cooking plans   Zachary Valdez has agreed to follow up with our clinic in 2 weeks. He was informed of the importance of frequent follow up visits to maximize his success with intensive lifestyle modifications for his multiple health conditions.   ALLERGIES: Allergies  Allergen Reactions  . Atacand [Candesartan Cilexetil]   . Atorvastatin     REACTION: achy  . Enalapril     cough  . Hydrocodone-Acetaminophen     REACTION: SOB, patient states drug made patient feel weird   . Losartan   . Tramadol     headache    MEDICATIONS: Current Outpatient Medications on File Prior to Visit  Medication Sig Dispense Refill  . aspirin EC 81 MG tablet Take 1 tablet (81 mg total) by mouth daily. 100 tablet 3  . diclofenac (VOLTAREN) 50 MG EC tablet Take 1 tablet (50 mg total) by mouth 2 (two) times daily as needed. 180 tablet 2  . olmesartan (BENICAR) 40 MG tablet TAKE ONE TABLET BY MOUTH DAILY 90 tablet 3  . rosuvastatin (CRESTOR) 40 MG tablet Take 1 tablet (40 mg total) by mouth daily. (Patient taking differently: Take 40 mg by mouth. Take one tablet by mouth every other day) 90 tablet 3  . tadalafil (CIALIS) 20 MG tablet Take 1 tablet (20 mg total) by mouth daily as needed for erectile dysfunction. 30 tablet 3  . vitamin B-12 (CYANOCOBALAMIN) 1000 MCG tablet Take 1 tablet (1,000 mcg total) by mouth daily. 100 tablet 3   No current facility-administered medications on file prior to visit.     PAST MEDICAL HISTORY: Past Medical History:  Diagnosis Date  . Allergic rhinitis   . Anxiety   . GERD (gastroesophageal reflux disease)   . Hyperlipidemia   . Hyperplastic colon polyp 2006  . Hypertension   . OA (osteoarthritis)   . Obesity   . Sleep apnea    cpap-   . Ulcerative esophagitis 2006   Dr. Fuller Plan  . Vitamin B 12 deficiency     PAST SURGICAL HISTORY: Past Surgical History:  Procedure Laterality Date  . KNEE ARTHROSCOPY     Right  . TOTAL KNEE ARTHROPLASTY Left 09/07/2014   Procedure: LEFT TOTAL KNEE ARTHROPLASTY;  Surgeon: Gearlean Alf, MD;  Location: [facility];  Service: Orthopedics;  Laterality: Left;    SOCIAL HISTORY: Social History   Tobacco Use  . Smoking status: Former Smoker    Types: Cigarettes    Last  attempt to quit: 10/02/1997    Years since quitting: 21.4  . Smokeless tobacco: Never Used  Substance Use Topics  . Alcohol use: Yes    Alcohol/week: 14.0 standard drinks    Types: 14 Glasses of wine per week  . Drug use: No    FAMILY HISTORY: Family History  Problem Relation Age of Onset  . Breast cancer Mother   . Hypertension Mother   . Hypertension Other   . Colon cancer Neg Hx     ROS: Review of Systems  Constitutional: Positive for weight loss.  Gastrointestinal: Negative for nausea and vomiting.  Musculoskeletal:       Negative muscle weakness  Endo/Heme/Allergies:       Negative hypoglycemia    PHYSICAL EXAM: Pt in no acute distress  RECENT LABS AND  TESTS: BMET    Component Value Date/Time   NA 139 12/16/2018 1100   K 4.3 12/16/2018 1100   CL 101 12/16/2018 1100   CO2 20 12/16/2018 1100   GLUCOSE 127 (H) 12/16/2018 1100   GLUCOSE 109 (H) 03/29/2018 0753   BUN 14 12/16/2018 1100   CREATININE 0.93 12/16/2018 1100   CALCIUM 8.9 12/16/2018 1100   GFRNONAA 86 12/16/2018 1100   GFRAA 99 12/16/2018 1100   Lab Results  Component Value Date   HGBA1C 5.9 (H) 12/16/2018   HGBA1C 6.0 10/05/2017   HGBA1C 6.0 03/29/2017   HGBA1C 6.0 03/10/2016   HGBA1C 6.4 12/06/2015   Lab Results  Component Value Date   INSULIN 45.8 (H) 12/16/2018   CBC    Component Value Date/Time   WBC 7.8 12/16/2018 1100   WBC 6.2 03/29/2018 0753   RBC 4.20 12/16/2018 1100   RBC 4.29 03/29/2018 0753   HGB 15.1 12/16/2018 1100   HCT 43.6 12/16/2018 1100   PLT 164.0 03/29/2018 0753   MCV 104 (H) 12/16/2018 1100   MCH 36.0 (H) 12/16/2018 1100   MCH 33.9 09/09/2014 0510   MCHC 34.6 12/16/2018 1100   MCHC 34.2 03/29/2018 0753   RDW 11.4 (L) 12/16/2018 1100   LYMPHSABS 1.2 12/16/2018 1100   MONOABS 0.8 03/29/2018 0753   EOSABS 0.2 12/16/2018 1100   BASOSABS 0.0 12/16/2018 1100   Iron/TIBC/Ferritin/ %Sat No results found for: IRON, TIBC, FERRITIN, IRONPCTSAT Lipid Panel      Component Value Date/Time   CHOL 177 12/16/2018 1100   TRIG 111 12/16/2018 1100   HDL 45 12/16/2018 1100   CHOLHDL 4 03/29/2018 0753   VLDL 22.2 03/29/2018 0753   LDLCALC 110 (H) 12/16/2018 1100   LDLDIRECT 146.2 10/07/2010 0814   Hepatic Function Panel     Component Value Date/Time   PROT 6.9 12/16/2018 1100   ALBUMIN 4.2 12/16/2018 1100   AST 63 (H) 12/16/2018 1100   ALT 45 (H) 12/16/2018 1100   ALKPHOS 67 12/16/2018 1100   BILITOT 0.6 12/16/2018 1100   BILIDIR 0.2 03/29/2018 0753      Component Value Date/Time   TSH 5.350 (H) 12/16/2018 1100   TSH 2.98 03/29/2018 0753   TSH 3.65 03/29/2017 0750      I, Trixie Dredge, am acting as transcriptionist for [name], MD I have reviewed the above documentation for accuracy and completeness, and I agree with the above. -[name], MD

## 2019-03-19 ENCOUNTER — Encounter (INDEPENDENT_AMBULATORY_CARE_PROVIDER_SITE_OTHER): Payer: Self-pay | Admitting: Family Medicine

## 2019-03-19 ENCOUNTER — Other Ambulatory Visit: Payer: Self-pay

## 2019-03-19 ENCOUNTER — Ambulatory Visit (INDEPENDENT_AMBULATORY_CARE_PROVIDER_SITE_OTHER): Payer: Medicare HMO | Admitting: Family Medicine

## 2019-03-19 DIAGNOSIS — Z6836 Body mass index (BMI) 36.0-36.9, adult: Secondary | ICD-10-CM | POA: Diagnosis not present

## 2019-03-19 DIAGNOSIS — R7303 Prediabetes: Secondary | ICD-10-CM | POA: Diagnosis not present

## 2019-03-19 MED ORDER — METFORMIN HCL 500 MG PO TABS: 500.0000 mg | ORAL_TABLET | Freq: Every day | ORAL | 0 refills | Status: DC

## 2019-03-25 NOTE — Progress Notes (Signed)
Office: [phone]  /  Fax: [phone] TeleHealth Visit:  Zachary Valdez has verbally consented to this TeleHealth visit today. The patient is located at home, the provider is located at the [facility] office. The participants in this visit include the listed provider and patient. The visit was conducted today via [email or web address].  HPI:   Chief Complaint: OBESITY Zachary Valdez is here to discuss his progress with his obesity treatment plan. He is on the Category 3 plan and is following his eating plan approximately 75 % of the time. He states he is walking 20 minutes 2 times per week. Zachary Valdez continues to do well with his weight loss efforts and thinks that he has lost 2 to 3 pounds on his Category 3 plan since our last visit. He finds that he is skipping lunch approximately 50% of the time due to decreased hunger and being busy, but then has increased afternoon snacking.   We were unable to weigh the patient today for this TeleHealth visit. He feels as if he has lost weight since his last visit. He has lost 0 lbs since starting treatment with [place].  Pre-Diabetes Zachary Valdez has a diagnosis of pre-diabetes based on his elevated Hgb A1c and was informed this puts him at greater risk of developing diabetes. He is stable on metformin and is doing well on his diet and weight loss. He continues to work on diet and exercise to decrease risk of diabetes. He denies nausea, vomiting, or hypoglycemia.   ASSESSMENT AND PLAN:  Prediabetes - Plan: metFORMIN (GLUCOPHAGE) 500 MG tablet  Class 2 severe obesity with serious comorbidity and body mass index (BMI) of 36.0 to 36.9 in adult, unspecified obesity type ([place])  PLAN:  Pre-Diabetes Zachary Valdez will continue to work on weight loss, exercise, and decreasing simple carbohydrates in his diet to help decrease the risk of diabetes. He was informed that eating too many simple carbohydrates or too many calories at one sitting increases the likelihood of GI side effects.  [name] agreed to continue metformin 500 mg qAM #30 with no refills and a prescription was written today. We will check labs when it is safe to come back into the clinic. [name] agreed to follow up with [place] as directed to monitor his progress in 2 weeks.   Obesity Zachary Valdez is currently in the action stage of change. As such, his goal is to continue with weight loss efforts. He has agreed to follow the Category 3 plan. Zachary Valdez has been instructed to work up to a goal of 150 minutes of combined cardio and strengthening exercise per week for weight loss and overall health benefits. We discussed the following Behavioral Modification Strategies today: ways to avoid night time snacking and better snacking choices.  Zachary Valdez has agreed to follow up with our clinic in 2 weeks. He was informed of the importance of frequent follow up visits to maximize his success with intensive lifestyle modifications for his multiple health conditions.  ALLERGIES: Allergies  Allergen Reactions  . Atacand [Candesartan Cilexetil]   . Atorvastatin     REACTION: achy  . Enalapril     cough  . Hydrocodone-Acetaminophen     REACTION: SOB, patient states drug made patient feel weird   . Losartan   . Tramadol     headache    MEDICATIONS: Current Outpatient Medications on File Prior to Visit  Medication Sig Dispense Refill  . aspirin EC 81 MG tablet Take 1 tablet (81 mg total) by mouth daily. 100 tablet  3  . diclofenac (VOLTAREN) 50 MG EC tablet Take 1 tablet (50 mg total) by mouth 2 (two) times daily as needed. 180 tablet 2  . olmesartan (BENICAR) 40 MG tablet TAKE ONE TABLET BY MOUTH DAILY 90 tablet 3  . rosuvastatin (CRESTOR) 40 MG tablet Take 1 tablet (40 mg total) by mouth daily. (Patient taking differently: Take 40 mg by mouth. Take one tablet by mouth every other day) 90 tablet 3  . tadalafil (CIALIS) 20 MG tablet Take 1 tablet (20 mg total) by mouth daily as needed for erectile dysfunction. 30 tablet 3  . vitamin B-12  (CYANOCOBALAMIN) 1000 MCG tablet Take 1 tablet (1,000 mcg total) by mouth daily. 100 tablet 3  . Vitamin D, Ergocalciferol, (DRISDOL) 1.25 MG (50000 UT) CAPS capsule Take 1 capsule (50,000 Units total) by mouth every 7 (seven) days. 4 capsule 0   No current facility-administered medications on file prior to visit.     PAST MEDICAL HISTORY: Past Medical History:  Diagnosis Date  . Allergic rhinitis   . Anxiety   . GERD (gastroesophageal reflux disease)   . Hyperlipidemia   . Hyperplastic colon polyp 2006  . Hypertension   . OA (osteoarthritis)   . Obesity   . Sleep apnea    cpap-   . Ulcerative esophagitis 2006   Dr. Fuller Plan  . Vitamin B 12 deficiency     PAST SURGICAL HISTORY: Past Surgical History:  Procedure Laterality Date  . KNEE ARTHROSCOPY     Right  . TOTAL KNEE ARTHROPLASTY Left 09/07/2014   Procedure: LEFT TOTAL KNEE ARTHROPLASTY;  Surgeon: Gearlean Alf, MD;  Location: [facility];  Service: Orthopedics;  Laterality: Left;    SOCIAL HISTORY: Social History   Tobacco Use  . Smoking status: Former Smoker    Types: Cigarettes    Quit date: 10/02/1997    Years since quitting: 21.4  . Smokeless tobacco: Never Used  Substance Use Topics  . Alcohol use: Yes    Alcohol/week: 14.0 standard drinks    Types: 14 Glasses of wine per week  . Drug use: No    FAMILY HISTORY: Family History  Problem Relation Age of Onset  . Breast cancer Mother   . Hypertension Mother   . Hypertension Other   . Colon cancer Neg Hx     ROS: Review of Systems  Gastrointestinal: Negative for nausea and vomiting.  Endo/Heme/Allergies:       Negative for hypoglycemia.    PHYSICAL EXAM: Pt in no acute distress  RECENT LABS AND TESTS: BMET    Component Value Date/Time   NA 139 12/16/2018 1100   K 4.3 12/16/2018 1100   CL 101 12/16/2018 1100   CO2 20 12/16/2018 1100   GLUCOSE 127 (H) 12/16/2018 1100   GLUCOSE 109 (H) 03/29/2018 0753   BUN 14 12/16/2018 1100   CREATININE 0.93  12/16/2018 1100   CALCIUM 8.9 12/16/2018 1100   GFRNONAA 86 12/16/2018 1100   GFRAA 99 12/16/2018 1100   Lab Results  Component Value Date   HGBA1C 5.9 (H) 12/16/2018   HGBA1C 6.0 10/05/2017   HGBA1C 6.0 03/29/2017   HGBA1C 6.0 03/10/2016   HGBA1C 6.4 12/06/2015   Lab Results  Component Value Date   INSULIN 45.8 (H) 12/16/2018   CBC    Component Value Date/Time   WBC 7.8 12/16/2018 1100   WBC 6.2 03/29/2018 0753   RBC 4.20 12/16/2018 1100   RBC 4.29 03/29/2018 0753   HGB 15.1 12/16/2018 1100  HCT 43.6 12/16/2018 1100   PLT 164.0 03/29/2018 0753   MCV 104 (H) 12/16/2018 1100   MCH 36.0 (H) 12/16/2018 1100   MCH 33.9 09/09/2014 0510   MCHC 34.6 12/16/2018 1100   MCHC 34.2 03/29/2018 0753   RDW 11.4 (L) 12/16/2018 1100   LYMPHSABS 1.2 12/16/2018 1100   MONOABS 0.8 03/29/2018 0753   EOSABS 0.2 12/16/2018 1100   BASOSABS 0.0 12/16/2018 1100   Iron/TIBC/Ferritin/ %Sat No results found for: IRON, TIBC, FERRITIN, IRONPCTSAT Lipid Panel     Component Value Date/Time   CHOL 177 12/16/2018 1100   TRIG 111 12/16/2018 1100   HDL 45 12/16/2018 1100   CHOLHDL 4 03/29/2018 0753   VLDL 22.2 03/29/2018 0753   LDLCALC 110 (H) 12/16/2018 1100   LDLDIRECT 146.2 10/07/2010 0814   Hepatic Function Panel     Component Value Date/Time   PROT 6.9 12/16/2018 1100   ALBUMIN 4.2 12/16/2018 1100   AST 63 (H) 12/16/2018 1100   ALT 45 (H) 12/16/2018 1100   ALKPHOS 67 12/16/2018 1100   BILITOT 0.6 12/16/2018 1100   BILIDIR 0.2 03/29/2018 0753      Component Value Date/Time   TSH 5.350 (H) 12/16/2018 1100   TSH 2.98 03/29/2018 0753   TSH 3.65 03/29/2017 0750   Results for JISHNU, JENNIGES (MRN [id number]) as of 03/25/2019 15:27  Ref. Range 12/16/2018 11:00  Vitamin D, 25-Hydroxy Latest Ref Range: 30.0 - 100.0 ng/mL 66.2    I, Marcille Blanco, CMA, am acting as transcriptionist for Starlyn Skeans, MD I have reviewed the above documentation for accuracy and completeness, and I agree  with the above. -[name], MD

## 2019-04-03 ENCOUNTER — Encounter (INDEPENDENT_AMBULATORY_CARE_PROVIDER_SITE_OTHER): Payer: Self-pay | Admitting: Bariatrics

## 2019-04-03 ENCOUNTER — Other Ambulatory Visit: Payer: Self-pay

## 2019-04-03 ENCOUNTER — Telehealth (INDEPENDENT_AMBULATORY_CARE_PROVIDER_SITE_OTHER): Payer: Medicare HMO | Admitting: Bariatrics

## 2019-04-03 DIAGNOSIS — E559 Vitamin D deficiency, unspecified: Secondary | ICD-10-CM

## 2019-04-03 DIAGNOSIS — Z6835 Body mass index (BMI) 35.0-35.9, adult: Secondary | ICD-10-CM | POA: Diagnosis not present

## 2019-04-03 DIAGNOSIS — R7303 Prediabetes: Secondary | ICD-10-CM

## 2019-04-03 MED ORDER — VITAMIN D (ERGOCALCIFEROL) 1.25 MG (50000 UNIT) PO CAPS: 50000.0000 [IU] | ORAL_CAPSULE | ORAL | 0 refills | Status: DC

## 2019-04-08 NOTE — Progress Notes (Signed)
Office: [phone]  /  Fax: [phone] TeleHealth Visit:  Zachary Valdez has verbally consented to this TeleHealth visit today. The patient is located at home, the provider is located at the [facility] office. The participants in this visit include the listed provider and patient and any and all parties involved. The visit was conducted today via telephone. [facility] was unable to use realtime audiovisual technology today and the telehealth visit was conducted via telephone.  HPI:   Chief Complaint: OBESITY Zachary Valdez is here to discuss his progress with his obesity treatment plan. He is on the Category 3 plan and is following his eating plan approximately 50 % of the time. He states he is walking 20 to 30 minutes 5 times per week. Zachary Valdez has lost 1 to 2 pounds (weight 240 lbs). He is not losing as fast as he would like. His goal weight is 220 pounds. We were unable to weigh the patient today for this TeleHealth visit. He feels as if he has lost weight since his last visit. He has lost 19 lbs since starting treatment with [place].  Vitamin D deficiency Zachary Valdez has a diagnosis of vitamin D deficiency. He is currently taking vit D and denies nausea, vomiting or muscle weakness.  Pre-Diabetes Zachary Valdez has a diagnosis of prediabetes based on his elevated Hgb A1c and was informed this puts him at greater risk of developing diabetes. His last A1c was at 5.9 and last insulin level was at 45.8 He is taking metformin currently and continues to work on diet and exercise to decrease risk of diabetes. He denies nausea or hypoglycemia.  ASSESSMENT AND PLAN:  Prediabetes  Vitamin D deficiency - Plan: Vitamin D, Ergocalciferol, (DRISDOL) 1.25 MG (50000 UT) CAPS capsule, DISCONTINUED: Vitamin D, Ergocalciferol, (DRISDOL) 1.25 MG (50000 UT) CAPS capsule  Class 2 severe obesity with serious comorbidity and body mass index (BMI) of 35.0 to 35.9 in adult, unspecified obesity type ([facility])  PLAN:  Vitamin D  Deficiency Zachary Valdez was informed that low vitamin D levels contributes to fatigue and are associated with obesity, breast, and colon cancer. He agrees to continue to take prescription Vit D @50 ,000 IU every 2 weeks #2 with no refills and will follow up for routine testing of vitamin D, at least 2-3 times per year. He was informed of the risk of over-replacement of vitamin D and agrees to not increase his dose unless he discusses this with [place] first. Zachary Valdez agrees to follow up as directed.  Pre-Diabetes Zachary Valdez will continue to work on weight loss, exercise, and decreasing simple carbohydrates in his diet to help decrease the risk of diabetes. We dicussed metformin including benefits and risks. He was informed that eating too many simple carbohydrates or too many calories at one sitting increases the likelihood of GI side effects. Zachary Valdez will continue metformin for now and a prescription was not written today. Zachary Valdez agreed to follow up with [place] as directed to monitor his progress.  Obesity Zachary Valdez is currently in the action stage of change. As such, his goal is to continue with weight loss efforts He has agreed to follow the Category 3 plan Zachary Valdez will continue walking for weight loss and overall health benefits. We discussed the following Behavioral Modification Strategies today: increase H2O intake, no skipping meals, keeping healthy foods in the home, increasing lean protein intake, decreasing simple carbohydrates, increasing vegetables, decrease eating out and work on meal planning and intentional eating Zachary Valdez will continue weighing his meat. Calories and protein for  each meal, were discussed with and given to patient today.  Zachary Valdez has agreed to follow up with our clinic in 2 weeks. He was informed of the importance of frequent follow up visits to maximize his success with intensive lifestyle modifications for his multiple health conditions.  ALLERGIES: Allergies  Allergen Reactions  . Atacand [Candesartan  Cilexetil]   . Atorvastatin     REACTION: achy  . Enalapril     cough  . Hydrocodone-Acetaminophen     REACTION: SOB, patient states drug made patient feel weird   . Losartan   . Tramadol     headache    MEDICATIONS: Current Outpatient Medications on File Prior to Visit  Medication Sig Dispense Refill  . aspirin EC 81 MG tablet Take 1 tablet (81 mg total) by mouth daily. 100 tablet 3  . diclofenac (VOLTAREN) 50 MG EC tablet Take 1 tablet (50 mg total) by mouth 2 (two) times daily as needed. 180 tablet 2  . metFORMIN (GLUCOPHAGE) 500 MG tablet Take 1 tablet (500 mg total) by mouth daily with breakfast. 30 tablet 0  . olmesartan (BENICAR) 40 MG tablet TAKE ONE TABLET BY MOUTH DAILY 90 tablet 3  . rosuvastatin (CRESTOR) 40 MG tablet Take 1 tablet (40 mg total) by mouth daily. (Patient taking differently: Take 40 mg by mouth. Take one tablet by mouth every other day) 90 tablet 3  . tadalafil (CIALIS) 20 MG tablet Take 1 tablet (20 mg total) by mouth daily as needed for erectile dysfunction. 30 tablet 3  . vitamin B-12 (CYANOCOBALAMIN) 1000 MCG tablet Take 1 tablet (1,000 mcg total) by mouth daily. 100 tablet 3   No current facility-administered medications on file prior to visit.     PAST MEDICAL HISTORY: Past Medical History:  Diagnosis Date  . Allergic rhinitis   . Anxiety   . GERD (gastroesophageal reflux disease)   . Hyperlipidemia   . Hyperplastic colon polyp 2006  . Hypertension   . OA (osteoarthritis)   . Obesity   . Sleep apnea    cpap-   . Ulcerative esophagitis 2006   Dr. Fuller Plan  . Vitamin B 12 deficiency     PAST SURGICAL HISTORY: Past Surgical History:  Procedure Laterality Date  . KNEE ARTHROSCOPY     Right  . TOTAL KNEE ARTHROPLASTY Left 09/07/2014   Procedure: LEFT TOTAL KNEE ARTHROPLASTY;  Surgeon: Gearlean Alf, MD;  Location: [facility];  Service: Orthopedics;  Laterality: Left;    SOCIAL HISTORY: Social History   Tobacco Use  . Smoking status:  Former Smoker    Types: Cigarettes    Quit date: 10/02/1997    Years since quitting: 21.5  . Smokeless tobacco: Never Used  Substance Use Topics  . Alcohol use: Yes    Alcohol/week: 14.0 standard drinks    Types: 14 Glasses of wine per week  . Drug use: No    FAMILY HISTORY: Family History  Problem Relation Age of Onset  . Breast cancer Mother   . Hypertension Mother   . Hypertension Other   . Colon cancer Neg Hx     ROS: Review of Systems  Constitutional: Positive for weight loss.  Gastrointestinal: Negative for nausea and vomiting.  Musculoskeletal:       Negative for muscle weakness  Endo/Heme/Allergies:       Negative for hypoglycemia    PHYSICAL EXAM: Pt in no acute distress  RECENT LABS AND TESTS: BMET    Component Value Date/Time   NA  139 12/16/2018 1100   K 4.3 12/16/2018 1100   CL 101 12/16/2018 1100   CO2 20 12/16/2018 1100   GLUCOSE 127 (H) 12/16/2018 1100   GLUCOSE 109 (H) 03/29/2018 0753   BUN 14 12/16/2018 1100   CREATININE 0.93 12/16/2018 1100   CALCIUM 8.9 12/16/2018 1100   GFRNONAA 86 12/16/2018 1100   GFRAA 99 12/16/2018 1100   Lab Results  Component Value Date   HGBA1C 5.9 (H) 12/16/2018   HGBA1C 6.0 10/05/2017   HGBA1C 6.0 03/29/2017   HGBA1C 6.0 03/10/2016   HGBA1C 6.4 12/06/2015   Lab Results  Component Value Date   INSULIN 45.8 (H) 12/16/2018   CBC    Component Value Date/Time   WBC 7.8 12/16/2018 1100   WBC 6.2 03/29/2018 0753   RBC 4.20 12/16/2018 1100   RBC 4.29 03/29/2018 0753   HGB 15.1 12/16/2018 1100   HCT 43.6 12/16/2018 1100   PLT 164.0 03/29/2018 0753   MCV 104 (H) 12/16/2018 1100   MCH 36.0 (H) 12/16/2018 1100   MCH 33.9 09/09/2014 0510   MCHC 34.6 12/16/2018 1100   MCHC 34.2 03/29/2018 0753   RDW 11.4 (L) 12/16/2018 1100   LYMPHSABS 1.2 12/16/2018 1100   MONOABS 0.8 03/29/2018 0753   EOSABS 0.2 12/16/2018 1100   BASOSABS 0.0 12/16/2018 1100   Iron/TIBC/Ferritin/ %Sat No results found for: IRON, TIBC,  FERRITIN, IRONPCTSAT Lipid Panel     Component Value Date/Time   CHOL 177 12/16/2018 1100   TRIG 111 12/16/2018 1100   HDL 45 12/16/2018 1100   CHOLHDL 4 03/29/2018 0753   VLDL 22.2 03/29/2018 0753   LDLCALC 110 (H) 12/16/2018 1100   LDLDIRECT 146.2 10/07/2010 0814   Hepatic Function Panel     Component Value Date/Time   PROT 6.9 12/16/2018 1100   ALBUMIN 4.2 12/16/2018 1100   AST 63 (H) 12/16/2018 1100   ALT 45 (H) 12/16/2018 1100   ALKPHOS 67 12/16/2018 1100   BILITOT 0.6 12/16/2018 1100   BILIDIR 0.2 03/29/2018 0753      Component Value Date/Time   TSH 5.350 (H) 12/16/2018 1100   TSH 2.98 03/29/2018 0753   TSH 3.65 03/29/2017 0750     Ref. Range 12/16/2018 11:00  Vitamin D, 25-Hydroxy Latest Ref Range: 30.0 - 100.0 ng/mL 66.2    I, Doreene Nest, am acting as Location manager for [employer]. Owens Shark, DO  I have reviewed the above documentation for accuracy and completeness, and I agree with the above. -Jearld Lesch, DO

## 2019-04-15 DIAGNOSIS — D692 Other nonthrombocytopenic purpura: Secondary | ICD-10-CM | POA: Diagnosis not present

## 2019-04-15 DIAGNOSIS — L821 Other seborrheic keratosis: Secondary | ICD-10-CM | POA: Diagnosis not present

## 2019-04-18 ENCOUNTER — Encounter (INDEPENDENT_AMBULATORY_CARE_PROVIDER_SITE_OTHER): Payer: Self-pay | Admitting: Family Medicine

## 2019-04-21 ENCOUNTER — Ambulatory Visit (INDEPENDENT_AMBULATORY_CARE_PROVIDER_SITE_OTHER): Payer: Medicare HMO | Admitting: Family Medicine

## 2019-04-28 ENCOUNTER — Ambulatory Visit (INDEPENDENT_AMBULATORY_CARE_PROVIDER_SITE_OTHER): Payer: Medicare HMO | Admitting: Family Medicine

## 2019-04-28 ENCOUNTER — Encounter (INDEPENDENT_AMBULATORY_CARE_PROVIDER_SITE_OTHER): Payer: Self-pay | Admitting: Family Medicine

## 2019-04-28 ENCOUNTER — Other Ambulatory Visit: Payer: Self-pay

## 2019-04-28 VITALS — BP 138/77 | HR 78 | Temp 97.9°F | Ht 71.0 in | Wt 243.0 lb

## 2019-04-28 DIAGNOSIS — E7849 Other hyperlipidemia: Secondary | ICD-10-CM | POA: Diagnosis not present

## 2019-04-28 DIAGNOSIS — R7303 Prediabetes: Secondary | ICD-10-CM | POA: Diagnosis not present

## 2019-04-28 DIAGNOSIS — R7989 Other specified abnormal findings of blood chemistry: Secondary | ICD-10-CM

## 2019-04-28 DIAGNOSIS — Z6834 Body mass index (BMI) 34.0-34.9, adult: Secondary | ICD-10-CM

## 2019-04-28 DIAGNOSIS — E669 Obesity, unspecified: Secondary | ICD-10-CM | POA: Diagnosis not present

## 2019-04-28 DIAGNOSIS — E559 Vitamin D deficiency, unspecified: Secondary | ICD-10-CM | POA: Diagnosis not present

## 2019-04-28 MED ORDER — METFORMIN HCL 500 MG PO TABS: 500.0000 mg | ORAL_TABLET | Freq: Every day | ORAL | 0 refills | Status: DC

## 2019-04-29 NOTE — Progress Notes (Signed)
Office: [phone]  /  Fax: [phone]   HPI:   Chief Complaint: OBESITY Zachary Valdez is here to discuss his progress with his obesity treatment plan. He is on the Category 3 plan and is following his eating plan approximately 70 % of the time. He states he is walking and mowing the yard with a push mower 20 to 30 minutes 5 times per week. Zachary Valdez has done well with weight loss. His last in-office visit was four months ago, and he has lost 16 pounds during this time. Hunger has been controlled, and he is doing well with meal planning and decreasing eating out. His weight is 243 lb (110.2 kg) today and has had a weight loss of 16 pounds since his last in-office visit. He has lost 16 lbs since starting treatment with [place].  Pre-Diabetes Zachary Valdez has a diagnosis of prediabetes based on his elevated Hgb A1c and was informed this puts him at greater risk of developing diabetes. His last A1c was at 5.9. He is taking metformin currently and continues to work on diet, weight loss and exercise to decrease risk of diabetes. He denies nausea, vomiting or hypoglycemia.  Vitamin D deficiency Zachary Valdez has a diagnosis of vitamin D deficiency. Zachary Valdez is stable on vit D, and he denies nausea, vomiting or muscle weakness. Zachary Valdez is due for labs.  Elevated TSH Zachary Valdez has elevated TSH and he has a history of hypothyroid. He denies hot or cold intolerance, and his T3 and T4 are within normal limits.  Hyperlipidemia Zachary Valdez has hyperlipidemia and he is attempting to improve his cholesterol levels with intensive lifestyle modification including a low saturated fat diet, exercise and weight loss. He denies any chest pain.  ASSESSMENT AND PLAN:  Prediabetes - Plan: Hemoglobin A1c, Insulin, random, Comprehensive metabolic panel, metFORMIN (GLUCOPHAGE) 500 MG tablet  Vitamin D deficiency - Plan: VITAMIN D 25 Hydroxy (Vit-D Deficiency, Fractures)  Elevated TSH - Plan: T3, T4, free, TSH  Other hyperlipidemia - Plan: Lipid Panel  With LDL/HDL Ratio  Class 1 obesity with serious comorbidity and body mass index (BMI) of 34.0 to 34.9 in adult, unspecified obesity type  PLAN:  Pre-Diabetes Zachary Valdez will continue to work on weight loss, exercise, and decreasing simple carbohydrates in his diet to help decrease the risk of diabetes. We dicussed metformin including benefits and risks. He was informed that eating too many simple carbohydrates or too many calories at one sitting increases the likelihood of GI side effects. Zachary Valdez agrees to continue metformin 500 mg daily with breakfast #30 with no refills, and we will check labs. Zachary Valdez agrees to follow up with [place] as directed to monitor his progress.  Vitamin D Deficiency Zachary Valdez was informed that low vitamin D levels contributes to fatigue and are associated with obesity, breast, and colon cancer. He will continue to take prescription Vit D @50 ,000 IU every 14 days and will follow up for routine testing of vitamin D, at least 2-3 times per year. He was informed of the risk of over-replacement of vitamin D and agrees to not increase his dose unless he discusses this with [place] first. We will check labs and follow.  Elevated TSH Zachary Valdez was informed of the importance of good thyroid control to help with weight loss efforts. We will check labs and follow.  Hyperlipidemia Zachary Valdez was informed of the American Heart Association Guidelines emphasizing intensive lifestyle modifications as the first line treatment for hyperlipidemia. We discussed many lifestyle modifications today in depth, and [name] will continue to work on  decreasing saturated fats such as fatty red meat, butter and many fried foods. He will also increase vegetables and lean protein in his diet and continue to work on exercise and weight loss efforts. We will check labs and Zachary Valdez will follow up at the agreed upon time.  Obesity Zachary Valdez is currently in the action stage of change. As such, his goal is to continue with weight loss efforts  He has agreed to follow the Category 3 plan Zachary Valdez has been instructed to work up to a goal of 150 minutes of combined cardio and strengthening exercise per week for weight loss and overall health benefits. We discussed the following Behavioral Modification Strategies today: keeping healthy foods in the home, better snacking choices and decreasing simple carbohydrates   Zachary Valdez has agreed to follow up with our clinic in 2 weeks. He was informed of the importance of frequent follow up visits to maximize his success with intensive lifestyle modifications for his multiple health conditions.  ALLERGIES: Allergies  Allergen Reactions  . Atacand [Candesartan Cilexetil]   . Atorvastatin     REACTION: achy  . Enalapril     cough  . Hydrocodone-Acetaminophen     REACTION: SOB, patient states drug made patient feel weird   . Losartan   . Tramadol     headache    MEDICATIONS: Current Outpatient Medications on File Prior to Visit  Medication Sig Dispense Refill  . aspirin EC 81 MG tablet Take 1 tablet (81 mg total) by mouth daily. 100 tablet 3  . diclofenac (VOLTAREN) 50 MG EC tablet Take 1 tablet (50 mg total) by mouth 2 (two) times daily as needed. 180 tablet 2  . olmesartan (BENICAR) 40 MG tablet TAKE ONE TABLET BY MOUTH DAILY 90 tablet 3  . rosuvastatin (CRESTOR) 40 MG tablet Take 1 tablet (40 mg total) by mouth daily. (Patient taking differently: Take 40 mg by mouth. Take one tablet by mouth every other day) 90 tablet 3  . tadalafil (CIALIS) 20 MG tablet Take 1 tablet (20 mg total) by mouth daily as needed for erectile dysfunction. 30 tablet 3  . vitamin B-12 (CYANOCOBALAMIN) 1000 MCG tablet Take 1 tablet (1,000 mcg total) by mouth daily. 100 tablet 3  . Vitamin D, Ergocalciferol, (DRISDOL) 1.25 MG (50000 UT) CAPS capsule Take 1 capsule (50,000 Units total) by mouth every 14 (fourteen) days. 2 capsule 0   No current facility-administered medications on file prior to visit.     PAST MEDICAL  HISTORY: Past Medical History:  Diagnosis Date  . Allergic rhinitis   . Anxiety   . GERD (gastroesophageal reflux disease)   . Hyperlipidemia   . Hyperplastic colon polyp 2006  . Hypertension   . OA (osteoarthritis)   . Obesity   . Sleep apnea    cpap-   . Ulcerative esophagitis 2006   Dr. Fuller Plan  . Vitamin B 12 deficiency     PAST SURGICAL HISTORY: Past Surgical History:  Procedure Laterality Date  . KNEE ARTHROSCOPY     Right  . TOTAL KNEE ARTHROPLASTY Left 09/07/2014   Procedure: LEFT TOTAL KNEE ARTHROPLASTY;  Surgeon: Gearlean Alf, MD;  Location: [facility];  Service: Orthopedics;  Laterality: Left;    SOCIAL HISTORY: Social History   Tobacco Use  . Smoking status: Former Smoker    Types: Cigarettes    Quit date: 10/02/1997    Years since quitting: 21.5  . Smokeless tobacco: Never Used  Substance Use Topics  . Alcohol use: Yes  Alcohol/week: 14.0 standard drinks    Types: 14 Glasses of wine per week  . Drug use: No    FAMILY HISTORY: Family History  Problem Relation Age of Onset  . Breast cancer Mother   . Hypertension Mother   . Hypertension Other   . Colon cancer Neg Hx     ROS: Review of Systems  Constitutional: Positive for weight loss.  Cardiovascular: Negative for chest pain.  Gastrointestinal: Negative for nausea and vomiting.  Musculoskeletal:       Negative for muscle weakness  Endo/Heme/Allergies:       Negative for hypoglycemia Negative for heat or cold intolerance    PHYSICAL EXAM: Blood pressure 138/77, pulse 78, temperature 97.9 F (36.6 C), temperature source Oral, height 5\' 11"  (1.803 m), weight 243 lb (110.2 kg), SpO2 94 %. Body mass index is 33.89 kg/m. Physical Exam Vitals signs reviewed.  Constitutional:      Appearance: Normal appearance. He is well-developed. He is obese.  Cardiovascular:     Rate and Rhythm: Normal rate.  Pulmonary:     Effort: Pulmonary effort is normal.  Musculoskeletal: Normal range of motion.   Skin:    General: Skin is warm and dry.  Neurological:     Mental Status: He is alert and oriented to person, place, and time.  Psychiatric:        Mood and Affect: Mood normal.        Behavior: Behavior normal.     RECENT LABS AND TESTS: BMET    Component Value Date/Time   NA 139 12/16/2018 1100   K 4.3 12/16/2018 1100   CL 101 12/16/2018 1100   CO2 20 12/16/2018 1100   GLUCOSE 127 (H) 12/16/2018 1100   GLUCOSE 109 (H) 03/29/2018 0753   BUN 14 12/16/2018 1100   CREATININE 0.93 12/16/2018 1100   CALCIUM 8.9 12/16/2018 1100   GFRNONAA 86 12/16/2018 1100   GFRAA 99 12/16/2018 1100   Lab Results  Component Value Date   HGBA1C 5.9 (H) 12/16/2018   HGBA1C 6.0 10/05/2017   HGBA1C 6.0 03/29/2017   HGBA1C 6.0 03/10/2016   HGBA1C 6.4 12/06/2015   Lab Results  Component Value Date   INSULIN 45.8 (H) 12/16/2018   CBC    Component Value Date/Time   WBC 7.8 12/16/2018 1100   WBC 6.2 03/29/2018 0753   RBC 4.20 12/16/2018 1100   RBC 4.29 03/29/2018 0753   HGB 15.1 12/16/2018 1100   HCT 43.6 12/16/2018 1100   PLT 164.0 03/29/2018 0753   MCV 104 (H) 12/16/2018 1100   MCH 36.0 (H) 12/16/2018 1100   MCH 33.9 09/09/2014 0510   MCHC 34.6 12/16/2018 1100   MCHC 34.2 03/29/2018 0753   RDW 11.4 (L) 12/16/2018 1100   LYMPHSABS 1.2 12/16/2018 1100   MONOABS 0.8 03/29/2018 0753   EOSABS 0.2 12/16/2018 1100   BASOSABS 0.0 12/16/2018 1100   Iron/TIBC/Ferritin/ %Sat No results found for: IRON, TIBC, FERRITIN, IRONPCTSAT Lipid Panel     Component Value Date/Time   CHOL 177 12/16/2018 1100   TRIG 111 12/16/2018 1100   HDL 45 12/16/2018 1100   CHOLHDL 4 03/29/2018 0753   VLDL 22.2 03/29/2018 0753   LDLCALC 110 (H) 12/16/2018 1100   LDLDIRECT 146.2 10/07/2010 0814   Hepatic Function Panel     Component Value Date/Time   PROT 6.9 12/16/2018 1100   ALBUMIN 4.2 12/16/2018 1100   AST 63 (H) 12/16/2018 1100   ALT 45 (H) 12/16/2018 1100   ALKPHOS  67 12/16/2018 1100   BILITOT  0.6 12/16/2018 1100   BILIDIR 0.2 03/29/2018 0753      Component Value Date/Time   TSH 5.350 (H) 12/16/2018 1100   TSH 2.98 03/29/2018 0753   TSH 3.65 03/29/2017 0750     Ref. Range 12/16/2018 11:00  Vitamin D, 25-Hydroxy Latest Ref Range: 30.0 - 100.0 ng/mL 66.2    OBESITY BEHAVIORAL INTERVENTION VISIT  Today's visit was # 9   Starting weight: 259 lbs Starting date: 12/16/2018 Today's weight : 243 lbs Today's date: 04/28/2019 Total lbs lost to date: 16    04/28/2019  Height 5\' 11"  (1.803 m)  Weight 243 lb (110.2 kg)  BMI (Calculated) 33.91  BLOOD PRESSURE - SYSTOLIC 161  BLOOD PRESSURE - DIASTOLIC 77   Body Fat % 09.6 %  Total Body Water (lbs) 112 lbs    ASK: We discussed the diagnosis of obesity with Zachary Valdez today and Zachary Valdez agreed to give [place] permission to discuss obesity behavioral modification therapy today.  ASSESS: Zachary Valdez has the diagnosis of obesity and his BMI today is 33.91 Zachary Valdez is in the action stage of change   ADVISE: Zachary Valdez was educated on the multiple health risks of obesity as well as the benefit of weight loss to improve his health. He was advised of the need for long term treatment and the importance of lifestyle modifications to improve his current health and to decrease his risk of future health problems.  AGREE: Multiple dietary modification options and treatment options were discussed and  Zachary Valdez agreed to follow the recommendations documented in the above note.  ARRANGE: Zachary Valdez was educated on the importance of frequent visits to treat obesity as outlined per CMS and USPSTF guidelines and agreed to schedule his next follow up appointment today.  I, Doreene Nest, am acting as transcriptionist for [name], MD I have reviewed the above documentation for accuracy and completeness, and I agree with the above. -[name], MD

## 2019-05-12 ENCOUNTER — Encounter (INDEPENDENT_AMBULATORY_CARE_PROVIDER_SITE_OTHER): Payer: Self-pay | Admitting: Family Medicine

## 2019-05-12 ENCOUNTER — Ambulatory Visit (INDEPENDENT_AMBULATORY_CARE_PROVIDER_SITE_OTHER): Payer: Medicare HMO | Admitting: Family Medicine

## 2019-05-12 ENCOUNTER — Other Ambulatory Visit: Payer: Self-pay

## 2019-05-12 VITALS — BP 121/72 | HR 93 | Temp 98.0°F | Ht 71.0 in | Wt 243.0 lb

## 2019-05-12 DIAGNOSIS — F3289 Other specified depressive episodes: Secondary | ICD-10-CM

## 2019-05-12 DIAGNOSIS — E669 Obesity, unspecified: Secondary | ICD-10-CM

## 2019-05-12 DIAGNOSIS — E559 Vitamin D deficiency, unspecified: Secondary | ICD-10-CM | POA: Diagnosis not present

## 2019-05-12 DIAGNOSIS — Z6833 Body mass index (BMI) 33.0-33.9, adult: Secondary | ICD-10-CM

## 2019-05-13 MED ORDER — BUPROPION HCL ER (SR) 150 MG PO TB12: 150.0000 mg | ORAL_TABLET | Freq: Every day | ORAL | 0 refills | Status: DC

## 2019-05-14 NOTE — Progress Notes (Signed)
Office: [phone]  /  Fax: [phone]   HPI:   Chief Complaint: OBESITY Bilal is here to discuss his progress with his obesity treatment plan. He is on the Category 3 plan and is following his eating plan approximately 20% of the time. He states he is walking 20 minutes 5 times per week. Taseen has been deviating from his plan more and struggling to eat everything. He is making a lot of non-equivalent substitutions. Last week his mother died and, of course, he has not concentrated on weight loss since then. He states he is ready to get back on track. His weight is 243 lb (110.2 kg) today and has not lost weight since his last visit. He has lost 16 lbs since starting treatment with [place].  Depression, Other Tajon's mother died last week and he is grieving. He notes decreased motivation to improve health, but this was present before his mother passed. He has decreased sleep and increased irritability according to his sister. He shows no sign of suicidal or homicidal ideations.  Depression screen [facility] 2/9 12/16/2018 03/12/2018 10/04/2017  Decreased Interest 2 0 0  Down, Depressed, Hopeless 1 0 0  PHQ - 2 Score 3 0 0  Altered sleeping 0 - -  Tired, decreased energy 2 - -  Change in appetite 0 - -  Feeling bad or failure about yourself  1 - -  Trouble concentrating 1 - -  Moving slowly or fidgety/restless 3 - -  Suicidal thoughts 0 - -  PHQ-9 Score 10 - -  Difficult doing work/chores Somewhat difficult - -   Vitamin D deficiency Danil has a diagnosis of Vitamin D deficiency, which is not yet at goal. He is currently stable on Vit D and denies nausea, vomiting or muscle weakness.  ASSESSMENT AND PLAN:  Vitamin D deficiency  Other depression - with emotional eating - Plan: buPROPion (WELLBUTRIN SR) 150 MG 12 hr tablet  Class 1 obesity with serious comorbidity and body mass index (BMI) of 33.0 to 33.9 in adult, unspecified obesity type  PLAN:  Depression, Other Ricard will start  Wellbutrin SR 150 mg QAM #30 with 0 refills. He agrees to follow-up with our clinic in 2 weeks.  Vitamin D Deficiency Bernerd was informed that low Vitamin D levels contributes to fatigue and are associated with obesity, breast, and colon cancer. He agrees to continue taking Vit D and will follow-up for routine testing of Vitamin D, at least 2-3 times per year. He was informed of the risk of over-replacement of Vitamin D and agrees to not increase his dose unless he discusses this with [place] first. Nesanel agrees to follow-up with our clinic in 2 weeks.  Obesity Wilman is currently in the action stage of change. As such, his goal is to continue with weight loss efforts. He has agreed to follow the Category 3 plan. Lean meat equivalents discussed. Brenon has been instructed to work up to a goal of 150 minutes of combined cardio and strengthening exercise per week for weight loss and overall health benefits. We discussed the following Behavioral Modification Strategies today: no skipping meals, keeping healthy foods in the home, better snacking choices, and emotional eating strategies.  Jada has agreed to follow-up with our clinic in 2 weeks. He was informed of the importance of frequent follow-up visits to maximize his success with intensive lifestyle modifications for his multiple health conditions.  ALLERGIES: Allergies  Allergen Reactions  . Atacand [Candesartan Cilexetil]   . Atorvastatin  REACTION: achy  . Enalapril     cough  . Hydrocodone-Acetaminophen     REACTION: SOB, patient states drug made patient feel weird   . Losartan   . Tramadol     headache    MEDICATIONS: Current Outpatient Medications on File Prior to Visit  Medication Sig Dispense Refill  . aspirin EC 81 MG tablet Take 1 tablet (81 mg total) by mouth daily. 100 tablet 3  . diclofenac (VOLTAREN) 50 MG EC tablet Take 1 tablet (50 mg total) by mouth 2 (two) times daily as needed. 180 tablet 2  . metFORMIN (GLUCOPHAGE)  500 MG tablet Take 1 tablet (500 mg total) by mouth daily with breakfast. 30 tablet 0  . olmesartan (BENICAR) 40 MG tablet TAKE ONE TABLET BY MOUTH DAILY 90 tablet 3  . rosuvastatin (CRESTOR) 40 MG tablet Take 1 tablet (40 mg total) by mouth daily. (Patient taking differently: Take 40 mg by mouth. Take one tablet by mouth every other day) 90 tablet 3  . vitamin B-12 (CYANOCOBALAMIN) 1000 MCG tablet Take 1 tablet (1,000 mcg total) by mouth daily. 100 tablet 3  . Vitamin D, Ergocalciferol, (DRISDOL) 1.25 MG (50000 UT) CAPS capsule Take 1 capsule (50,000 Units total) by mouth every 14 (fourteen) days. 2 capsule 0  . tadalafil (CIALIS) 20 MG tablet Take 1 tablet (20 mg total) by mouth daily as needed for erectile dysfunction. 30 tablet 3   No current facility-administered medications on file prior to visit.     PAST MEDICAL HISTORY: Past Medical History:  Diagnosis Date  . Allergic rhinitis   . Anxiety   . GERD (gastroesophageal reflux disease)   . Hyperlipidemia   . Hyperplastic colon polyp 2006  . Hypertension   . OA (osteoarthritis)   . Obesity   . Sleep apnea    cpap-   . Ulcerative esophagitis 2006   Dr. Fuller Plan  . Vitamin B 12 deficiency     PAST SURGICAL HISTORY: Past Surgical History:  Procedure Laterality Date  . KNEE ARTHROSCOPY     Right  . TOTAL KNEE ARTHROPLASTY Left 09/07/2014   Procedure: LEFT TOTAL KNEE ARTHROPLASTY;  Surgeon: Gearlean Alf, MD;  Location: [facility];  Service: Orthopedics;  Laterality: Left;    SOCIAL HISTORY: Social History   Tobacco Use  . Smoking status: Former Smoker    Types: Cigarettes    Quit date: 10/02/1997    Years since quitting: 21.6  . Smokeless tobacco: Never Used  Substance Use Topics  . Alcohol use: Yes    Alcohol/week: 14.0 standard drinks    Types: 14 Glasses of wine per week  . Drug use: No    FAMILY HISTORY: Family History  Problem Relation Age of Onset  . Breast cancer Mother   . Hypertension Mother   . Hypertension  Other   . Colon cancer Neg Hx    ROS: Review of Systems  Gastrointestinal: Negative for nausea and vomiting.  Musculoskeletal:       Negative for muscle weakness.  Psychiatric/Behavioral: Positive for depression. Negative for suicidal ideas.       Negative for homicidal ideas.   PHYSICAL EXAM: Blood pressure 121/72, pulse 93, temperature 98 F (36.7 C), temperature source Oral, height 5\' 11"  (1.803 m), weight 243 lb (110.2 kg), SpO2 96 %. Body mass index is 33.89 kg/m. Physical Exam Vitals signs reviewed.  Constitutional:      Appearance: Normal appearance. He is obese.  Cardiovascular:     Rate and Rhythm:  Normal rate.     Pulses: Normal pulses.  Pulmonary:     Effort: Pulmonary effort is normal.     Breath sounds: Normal breath sounds.  Musculoskeletal: Normal range of motion.  Skin:    General: Skin is warm and dry.  Neurological:     Mental Status: He is alert and oriented to person, place, and time.  Psychiatric:        Behavior: Behavior normal.   RECENT LABS AND TESTS: BMET    Component Value Date/Time   NA 139 12/16/2018 1100   K 4.3 12/16/2018 1100   CL 101 12/16/2018 1100   CO2 20 12/16/2018 1100   GLUCOSE 127 (H) 12/16/2018 1100   GLUCOSE 109 (H) 03/29/2018 0753   BUN 14 12/16/2018 1100   CREATININE 0.93 12/16/2018 1100   CALCIUM 8.9 12/16/2018 1100   GFRNONAA 86 12/16/2018 1100   GFRAA 99 12/16/2018 1100   Lab Results  Component Value Date   HGBA1C 5.9 (H) 12/16/2018   HGBA1C 6.0 10/05/2017   HGBA1C 6.0 03/29/2017   HGBA1C 6.0 03/10/2016   HGBA1C 6.4 12/06/2015   Lab Results  Component Value Date   INSULIN 45.8 (H) 12/16/2018   CBC    Component Value Date/Time   WBC 7.8 12/16/2018 1100   WBC 6.2 03/29/2018 0753   RBC 4.20 12/16/2018 1100   RBC 4.29 03/29/2018 0753   HGB 15.1 12/16/2018 1100   HCT 43.6 12/16/2018 1100   PLT 164.0 03/29/2018 0753   MCV 104 (H) 12/16/2018 1100   MCH 36.0 (H) 12/16/2018 1100   MCH 33.9 09/09/2014 0510    MCHC 34.6 12/16/2018 1100   MCHC 34.2 03/29/2018 0753   RDW 11.4 (L) 12/16/2018 1100   LYMPHSABS 1.2 12/16/2018 1100   MONOABS 0.8 03/29/2018 0753   EOSABS 0.2 12/16/2018 1100   BASOSABS 0.0 12/16/2018 1100   Iron/TIBC/Ferritin/ %Sat No results found for: IRON, TIBC, FERRITIN, IRONPCTSAT Lipid Panel     Component Value Date/Time   CHOL 177 12/16/2018 1100   TRIG 111 12/16/2018 1100   HDL 45 12/16/2018 1100   CHOLHDL 4 03/29/2018 0753   VLDL 22.2 03/29/2018 0753   LDLCALC 110 (H) 12/16/2018 1100   LDLDIRECT 146.2 10/07/2010 0814   Hepatic Function Panel     Component Value Date/Time   PROT 6.9 12/16/2018 1100   ALBUMIN 4.2 12/16/2018 1100   AST 63 (H) 12/16/2018 1100   ALT 45 (H) 12/16/2018 1100   ALKPHOS 67 12/16/2018 1100   BILITOT 0.6 12/16/2018 1100   BILIDIR 0.2 03/29/2018 0753      Component Value Date/Time   TSH 5.350 (H) 12/16/2018 1100   TSH 2.98 03/29/2018 0753   TSH 3.65 03/29/2017 0750   Results for BARACK, NICODEMUS (MRN [id number]) as of 05/14/2019 09:27  Ref. Range 12/16/2018 11:00  Vitamin D, 25-Hydroxy Latest Ref Range: 30.0 - 100.0 ng/mL 66.2   OBESITY BEHAVIORAL INTERVENTION VISIT  Today's visit was #10  Starting weight: 259 lbs Starting date: 12/16/2018 Today's weight: 243 lbs Today's date: 05/12/2019 Total lbs lost to date: 16 At least 15 minutes were spent on discussing the following behavioral intervention visit.    05/12/2019  Height 5\' 11"  (1.803 m)  Weight 243 lb (110.2 kg)  BMI (Calculated) 33.91  BLOOD PRESSURE - SYSTOLIC 761  BLOOD PRESSURE - DIASTOLIC 72   Body Fat % 60.7 %  Total Body Water (lbs) 110.8 lbs   ASK: We discussed the diagnosis of obesity with Sou C  Tamala Julian today and Madoc agreed to give [place] permission to discuss obesity behavioral modification therapy today.  ASSESS: Taeshawn has the diagnosis of obesity and his BMI today is 33.9. Elzia is in the action stage of change.   ADVISE: Apolinar was educated on the multiple  health risks of obesity as well as the benefit of weight loss to improve his health. He was advised of the need for long term treatment and the importance of lifestyle modifications to improve his current health and to decrease his risk of future health problems.  AGREE: Multiple dietary modification options and treatment options were discussed and  Nochum agreed to follow the recommendations documented in the above note.  ARRANGE: Idris was educated on the importance of frequent visits to treat obesity as outlined per CMS and USPSTF guidelines and agreed to schedule his next follow up appointment today.  I, Michaelene [name], am acting as Location manager for [name], MD I have reviewed the above documentation for accuracy and completeness, and I agree with the above. -[name], MD

## 2019-05-21 IMAGING — DX DG KNEE COMPLETE 4+V*R*
4 series · 4 of 4 positions shown · non-contrast
Comparison: None.

CLINICAL DATA: Chronic right knee pain for 5 years. No known
injury.

EXAM:
RIGHT KNEE - COMPLETE 4+ VIEW

[knee ap]
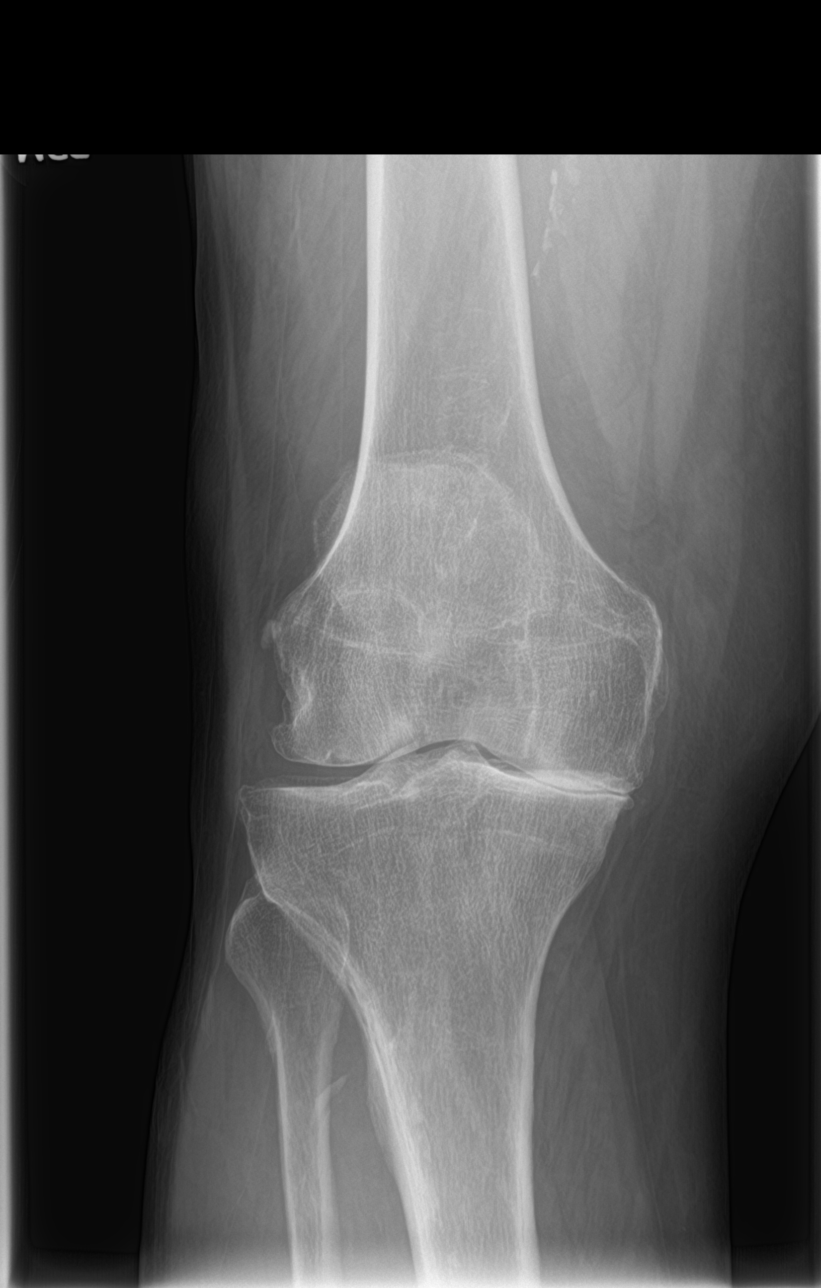

[knee tunnel]
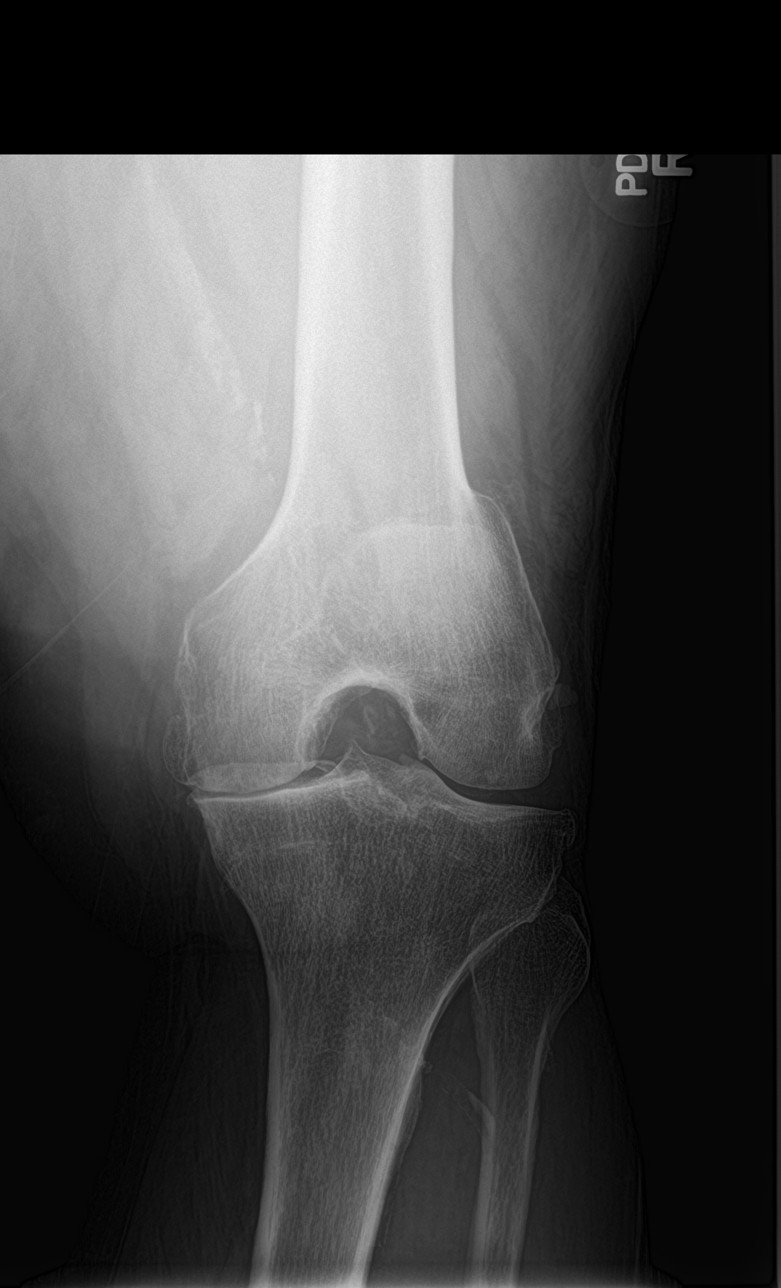

[knee lat]
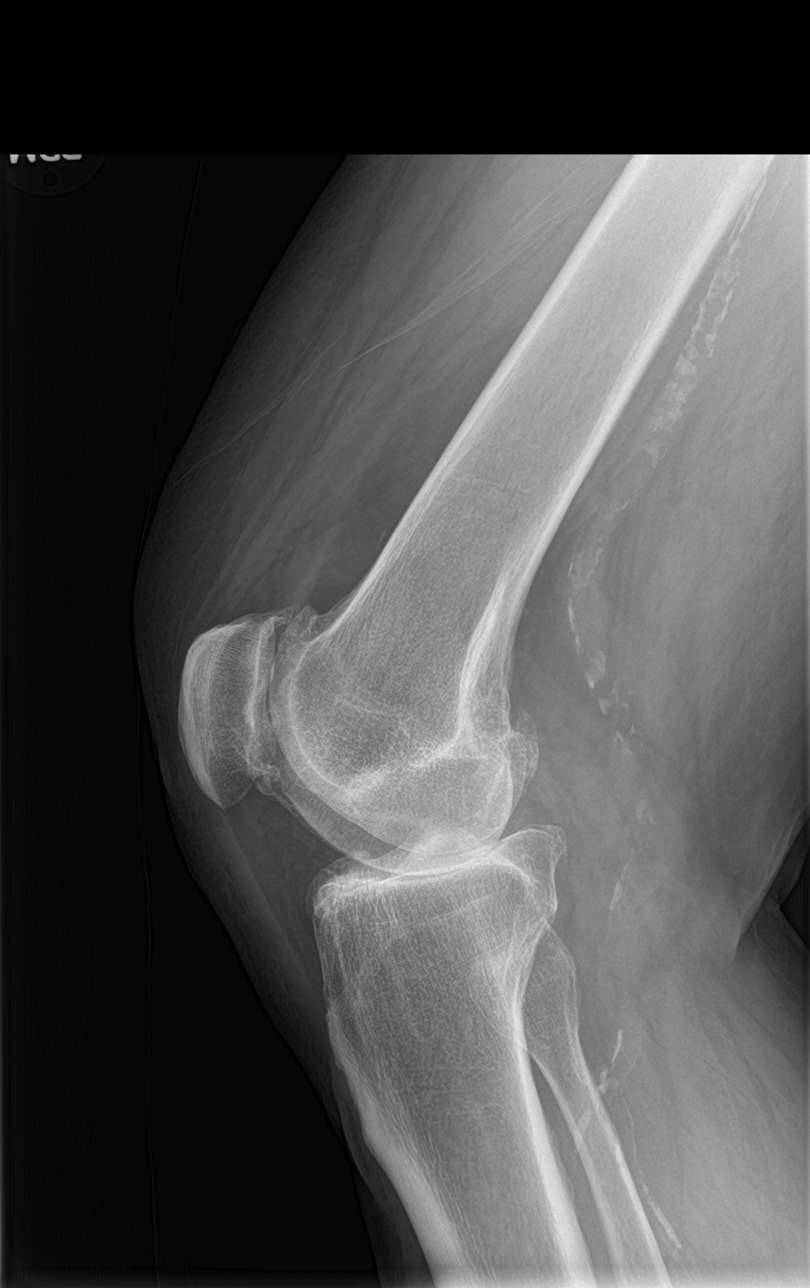

[sunrise]
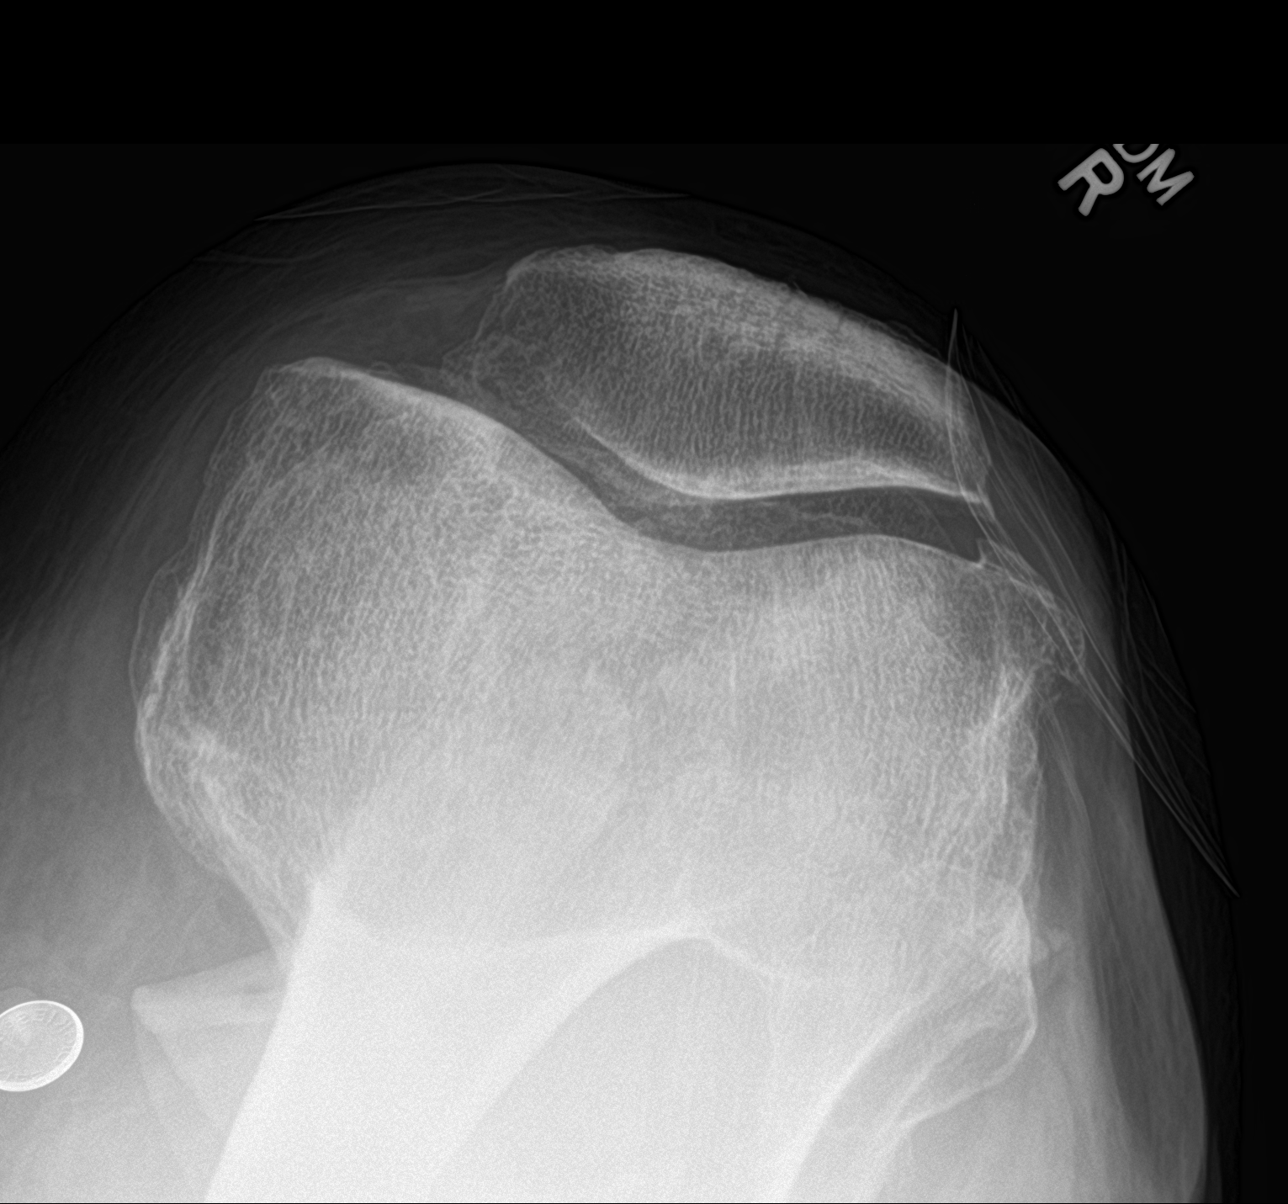

[4 of 4 positions shown; findings below may reference images not displayed]

FINDINGS: Tricompartment degenerative changes with joint space narrowing and
spurring, most pronounced in the medial and patellofemoral
compartments. No significant joint effusion. No acute bony
abnormality. Specifically, no fracture, subluxation, or dislocation.
Vascular calcifications noted.
IMPRESSION: Moderate tricompartment degenerative changes, most pronounced in the
medial and patellofemoral compartments. No acute bony abnormality.

## 2019-05-22 DIAGNOSIS — E559 Vitamin D deficiency, unspecified: Secondary | ICD-10-CM | POA: Diagnosis not present

## 2019-05-22 DIAGNOSIS — R7303 Prediabetes: Secondary | ICD-10-CM | POA: Diagnosis not present

## 2019-05-22 DIAGNOSIS — R7989 Other specified abnormal findings of blood chemistry: Secondary | ICD-10-CM | POA: Diagnosis not present

## 2019-05-22 DIAGNOSIS — E7849 Other hyperlipidemia: Secondary | ICD-10-CM | POA: Diagnosis not present

## 2019-05-23 LAB — LIPID PANEL WITH LDL/HDL RATIO
Cholesterol, Total: 193 mg/dL (ref 100–199)
HDL: 54 mg/dL (ref >39)
LDL Calculated: 109 mg/dL — ABNORMAL HIGH (ref 0–99)
LDl/HDL Ratio: 2 ratio (ref 0.0–3.6)
Triglycerides: 149 mg/dL (ref 0–149)
VLDL Cholesterol Cal: 30 mg/dL (ref 5–40)

## 2019-05-23 LAB — COMPREHENSIVE METABOLIC PANEL
ALT: 61 IU/L — ABNORMAL HIGH (ref 0–44)
AST: 74 IU/L — ABNORMAL HIGH (ref 0–40)
Albumin/Globulin Ratio: 1.5 (ref 1.2–2.2)
Albumin: 4.3 g/dL (ref 3.8–4.8)
Alkaline Phosphatase: 73 IU/L (ref 39–117)
BUN/Creatinine Ratio: 20 (ref 10–24)
BUN: 18 mg/dL (ref 8–27)
Bilirubin Total: 0.5 mg/dL (ref 0.0–1.2)
CO2: 21 mmol/L (ref 20–29)
Calcium: 9.2 mg/dL (ref 8.6–10.2)
Chloride: 103 mmol/L (ref 96–106)
Creatinine, Ser: 0.89 mg/dL (ref 0.76–1.27)
GFR calc Af Amer: 103 mL/min/{1.73_m2} (ref >59)
GFR calc non Af Amer: 89 mL/min/{1.73_m2} (ref >59)
Globulin, Total: 2.8 g/dL (ref 1.5–4.5)
Glucose: 100 mg/dL — ABNORMAL HIGH (ref 65–99)
Potassium: 4.6 mmol/L (ref 3.5–5.2)
Sodium: 140 mmol/L (ref 134–144)
Total Protein: 7.1 g/dL (ref 6.0–8.5)

## 2019-05-23 LAB — HEMOGLOBIN A1C
Est. average glucose Bld gHb Est-mCnc: 120 mg/dL
Hgb A1c MFr Bld: 5.8 % — ABNORMAL HIGH (ref 4.8–5.6)

## 2019-05-23 LAB — VITAMIN D 25 HYDROXY (VIT D DEFICIENCY, FRACTURES): Vit D, 25-Hydroxy: 59.9 ng/mL (ref 30.0–100.0)

## 2019-05-23 LAB — T3: T3, Total: 120 ng/dL (ref 71–180)

## 2019-05-23 LAB — TSH: TSH: 3.32 u[IU]/mL (ref 0.450–4.500)

## 2019-05-23 LAB — INSULIN, RANDOM: INSULIN: 23 u[IU]/mL (ref 2.6–24.9)

## 2019-05-23 LAB — T4, FREE: Free T4: 1.21 ng/dL (ref 0.82–1.77)

## 2019-05-26 ENCOUNTER — Ambulatory Visit (INDEPENDENT_AMBULATORY_CARE_PROVIDER_SITE_OTHER): Payer: Medicare HMO | Admitting: Family Medicine

## 2019-05-26 ENCOUNTER — Encounter (INDEPENDENT_AMBULATORY_CARE_PROVIDER_SITE_OTHER): Payer: Self-pay | Admitting: Family Medicine

## 2019-05-26 ENCOUNTER — Other Ambulatory Visit: Payer: Self-pay

## 2019-05-26 VITALS — BP 132/76 | HR 89 | Temp 97.9°F | Ht 71.0 in | Wt 241.0 lb

## 2019-05-26 DIAGNOSIS — R7303 Prediabetes: Secondary | ICD-10-CM | POA: Diagnosis not present

## 2019-05-26 DIAGNOSIS — E559 Vitamin D deficiency, unspecified: Secondary | ICD-10-CM

## 2019-05-26 DIAGNOSIS — R945 Abnormal results of liver function studies: Secondary | ICD-10-CM

## 2019-05-26 DIAGNOSIS — E669 Obesity, unspecified: Secondary | ICD-10-CM | POA: Diagnosis not present

## 2019-05-26 DIAGNOSIS — R7989 Other specified abnormal findings of blood chemistry: Secondary | ICD-10-CM

## 2019-05-26 DIAGNOSIS — Z6833 Body mass index (BMI) 33.0-33.9, adult: Secondary | ICD-10-CM

## 2019-05-26 DIAGNOSIS — F3289 Other specified depressive episodes: Secondary | ICD-10-CM

## 2019-05-26 MED ORDER — VITAMIN D (ERGOCALCIFEROL) 1.25 MG (50000 UNIT) PO CAPS: 50000.0000 [IU] | ORAL_CAPSULE | ORAL | 0 refills | Status: DC

## 2019-05-26 MED ORDER — METFORMIN HCL 500 MG PO TABS: 500.0000 mg | ORAL_TABLET | Freq: Every day | ORAL | 0 refills | Status: DC

## 2019-05-26 MED ORDER — BUPROPION HCL ER (SR) 150 MG PO TB12: 150.0000 mg | ORAL_TABLET | Freq: Every day | ORAL | 0 refills | Status: DC

## 2019-05-27 NOTE — Progress Notes (Signed)
Office: [phone]  /  Fax: [phone]   HPI:   Chief Complaint: OBESITY Zachary Valdez is here to discuss his progress with his obesity treatment plan. He is on the Category 3 plan and is following his eating plan approximately 50% of the time. He states he is walking/mowing the yard 20-25 minutes 5 times per week. Zachary Valdez has done well with weight loss on his Category 3 plan. Hunger is controlled, but he still does not always eat all of his protein. His weight is 241 lb (109.3 kg) today and has had a weight loss of 2 pounds over a period of 2 weeks since his last visit. He has lost 18 lbs since starting treatment with [place].  Pre-Diabetes Zachary Valdez has a diagnosis of prediabetes based on his elevated Hgb A1c and was informed this puts him at greater risk of developing diabetes. He is stable on metformin currently and continues to work on diet and exercise to decrease risk of diabetes. He denies nausea, vomiting, or hypoglycemia.  Vitamin D deficiency Zachary Valdez has a diagnosis of Vitamin D deficiency. Last Vitamin D level was at goal at 59.9 on 05/22/2019. He is currently taking prescription Vit D and denies nausea, vomiting or muscle weakness.  Depression, Other Zachary Valdez's mood is stable on medications. His mother died recently and he is grieving. He shows no sign of suicidal or homicidal ideations.  Depression screen [facility] 2/9 12/16/2018 03/12/2018 10/04/2017  Decreased Interest 2 0 0  Down, Depressed, Hopeless 1 0 0  PHQ - 2 Score 3 0 0  Altered sleeping 0 - -  Tired, decreased energy 2 - -  Change in appetite 0 - -  Feeling bad or failure about yourself  1 - -  Trouble concentrating 1 - -  Moving slowly or fidgety/restless 3 - -  Suicidal thoughts 0 - -  PHQ-9 Score 10 - -  Difficult doing work/chores Somewhat difficult - -   Elevated Liver Function Tests Zachary Valdez denies abdominal pain or jaundice. LFT's are slightly higher than previously. He notes increased ETOH since his mother died 3 weeks ago and  wonders if it is related.  ASSESSMENT AND PLAN:  Prediabetes - Plan: metFORMIN (GLUCOPHAGE) 500 MG tablet  Vitamin D deficiency - Plan: Vitamin D, Ergocalciferol, (DRISDOL) 1.25 MG (50000 UT) CAPS capsule  Elevated LFTs  Other depression - with emotional eating - Plan: buPROPion (WELLBUTRIN SR) 150 MG 12 hr tablet  Class 1 obesity with serious comorbidity and body mass index (BMI) of 33.0 to 33.9 in adult, unspecified obesity type  PLAN:  Pre-Diabetes Zachary Valdez will continue to work on weight loss, exercise, and decreasing simple carbohydrates in his diet to help decrease the risk of diabetes. We dicussed metformin including benefits and risks. He was informed that eating too many simple carbohydrates or too many calories at one sitting increases the likelihood of GI side effects. Zachary Valdez was given a refill on his metformin 500 mg #30 with 0 refills and he agrees to follow-up with our clinic in 2 weeks.  Vitamin D Deficiency Zachary Valdez was informed that low Vitamin D levels contributes to fatigue and are associated with obesity, breast, and colon cancer. He agrees to continue to take prescription Vit D @ 50,000 IU every 14 days #2 with 0 refills and will follow-up for routine testing of Vitamin D, at least 2-3 times per year. He was informed of the risk of over-replacement of Vitamin D and agrees to not increase his dose unless he discusses this with [place]  first. Zachary Valdez agrees to follow-up with our clinic in 2 weeks.  Depression, Other  We discussed behavior modification techniques today to help Zachary Valdez deal with his emotional eating and depression. Zachary Valdez was given a refill on his Wellbutrin 150 mg #30 with 0 refills and he agrees to follow-up with our clinic in 2 weeks.   Elevated Liver Function Tests Zachary Valdez was advised to decrease ETOH and will continue to monitor. He may need an abdominal ultrasound if symptoms develop or labs continue to worsen. He was also advised to not use Tylenol. Zachary Valdez agreed to  continue with his weight loss efforts with healthier diet and exercise as an essential part of his treatment plan.  Obesity Zachary Valdez is currently in the action stage of change. As such, his goal is to continue with weight loss efforts. He has agreed to follow the Category 3 plan. Zachary Valdez has been instructed to work up to a goal of 150 minutes of combined cardio and strengthening exercise per week for weight loss and overall health benefits. We discussed the following Behavioral Modification Strategies today: increasing lean protein intake and decreasing simple carbohydrates, and decrease ETOH.   Zachary Valdez has agreed to follow-up with our clinic in 2 weeks. He was informed of the importance of frequent follow-up visits to maximize his success with intensive lifestyle modifications for his multiple health conditions.  ALLERGIES: Allergies  Allergen Reactions  . Atacand [Candesartan Cilexetil]   . Atorvastatin     REACTION: achy  . Enalapril     cough  . Hydrocodone-Acetaminophen     REACTION: SOB, patient states drug made patient feel weird   . Losartan   . Tramadol     headache    MEDICATIONS: Current Outpatient Medications on File Prior to Visit  Medication Sig Dispense Refill  . aspirin EC 81 MG tablet Take 1 tablet (81 mg total) by mouth daily. 100 tablet 3  . diclofenac (VOLTAREN) 50 MG EC tablet Take 1 tablet (50 mg total) by mouth 2 (two) times daily as needed. 180 tablet 2  . olmesartan (BENICAR) 40 MG tablet TAKE ONE TABLET BY MOUTH DAILY 90 tablet 3  . rosuvastatin (CRESTOR) 40 MG tablet Take 1 tablet (40 mg total) by mouth daily. (Patient taking differently: Take 40 mg by mouth. Take one tablet by mouth every other day) 90 tablet 3  . vitamin B-12 (CYANOCOBALAMIN) 1000 MCG tablet Take 1 tablet (1,000 mcg total) by mouth daily. 100 tablet 3  . tadalafil (CIALIS) 20 MG tablet Take 1 tablet (20 mg total) by mouth daily as needed for erectile dysfunction. 30 tablet 3   No current  facility-administered medications on file prior to visit.     PAST MEDICAL HISTORY: Past Medical History:  Diagnosis Date  . Allergic rhinitis   . Anxiety   . GERD (gastroesophageal reflux disease)   . Hyperlipidemia   . Hyperplastic colon polyp 2006  . Hypertension   . OA (osteoarthritis)   . Obesity   . Sleep apnea    cpap-   . Ulcerative esophagitis 2006   Dr. Fuller Plan  . Vitamin B 12 deficiency     PAST SURGICAL HISTORY: Past Surgical History:  Procedure Laterality Date  . KNEE ARTHROSCOPY     Right  . TOTAL KNEE ARTHROPLASTY Left 09/07/2014   Procedure: LEFT TOTAL KNEE ARTHROPLASTY;  Surgeon: Gearlean Alf, MD;  Location: [facility];  Service: Orthopedics;  Laterality: Left;    SOCIAL HISTORY: Social History   Tobacco Use  .  Smoking status: Former Smoker    Types: Cigarettes    Quit date: 10/02/1997    Years since quitting: 21.6  . Smokeless tobacco: Never Used  Substance Use Topics  . Alcohol use: Yes    Alcohol/week: 14.0 standard drinks    Types: 14 Glasses of wine per week  . Drug use: No    FAMILY HISTORY: Family History  Problem Relation Age of Onset  . Breast cancer Mother   . Hypertension Mother   . Hypertension Other   . Colon cancer Neg Hx    ROS: Review of Systems  Gastrointestinal: Negative for nausea and vomiting.  Musculoskeletal:       Negative for muscle weakness.  Endo/Heme/Allergies:       Negative for hypoglycemia.  Psychiatric/Behavioral: Positive for depression. Negative for suicidal ideas.       Negative for homicidal ideas.   PHYSICAL EXAM: Blood pressure 132/76, pulse 89, temperature 97.9 F (36.6 C), temperature source Oral, height 5\' 11"  (1.803 m), weight 241 lb (109.3 kg), SpO2 95 %. Body mass index is 33.61 kg/m. Physical Exam Vitals signs reviewed.  Constitutional:      Appearance: Normal appearance. He is obese.  Cardiovascular:     Rate and Rhythm: Normal rate.     Pulses: Normal pulses.  Pulmonary:     Effort:  Pulmonary effort is normal.     Breath sounds: Normal breath sounds.  Musculoskeletal: Normal range of motion.  Skin:    General: Skin is warm and dry.  Neurological:     Mental Status: He is alert and oriented to person, place, and time.  Psychiatric:        Behavior: Behavior normal.   RECENT LABS AND TESTS: BMET    Component Value Date/Time   NA 140 05/22/2019 0941   K 4.6 05/22/2019 0941   CL 103 05/22/2019 0941   CO2 21 05/22/2019 0941   GLUCOSE 100 (H) 05/22/2019 0941   GLUCOSE 109 (H) 03/29/2018 0753   BUN 18 05/22/2019 0941   CREATININE 0.89 05/22/2019 0941   CALCIUM 9.2 05/22/2019 0941   GFRNONAA 89 05/22/2019 0941   GFRAA 103 05/22/2019 0941   Lab Results  Component Value Date   HGBA1C 5.8 (H) 05/22/2019   HGBA1C 5.9 (H) 12/16/2018   HGBA1C 6.0 10/05/2017   HGBA1C 6.0 03/29/2017   HGBA1C 6.0 03/10/2016   Lab Results  Component Value Date   INSULIN 23.0 05/22/2019   INSULIN 45.8 (H) 12/16/2018   CBC    Component Value Date/Time   WBC 7.8 12/16/2018 1100   WBC 6.2 03/29/2018 0753   RBC 4.20 12/16/2018 1100   RBC 4.29 03/29/2018 0753   HGB 15.1 12/16/2018 1100   HCT 43.6 12/16/2018 1100   PLT 164.0 03/29/2018 0753   MCV 104 (H) 12/16/2018 1100   MCH 36.0 (H) 12/16/2018 1100   MCH 33.9 09/09/2014 0510   MCHC 34.6 12/16/2018 1100   MCHC 34.2 03/29/2018 0753   RDW 11.4 (L) 12/16/2018 1100   LYMPHSABS 1.2 12/16/2018 1100   MONOABS 0.8 03/29/2018 0753   EOSABS 0.2 12/16/2018 1100   BASOSABS 0.0 12/16/2018 1100   Iron/TIBC/Ferritin/ %Sat No results found for: IRON, TIBC, FERRITIN, IRONPCTSAT Lipid Panel     Component Value Date/Time   CHOL 193 05/22/2019 0941   TRIG 149 05/22/2019 0941   HDL 54 05/22/2019 0941   CHOLHDL 4 03/29/2018 0753   VLDL 22.2 03/29/2018 0753   LDLCALC 109 (H) 05/22/2019 0941   LDLDIRECT  146.2 10/07/2010 0814   Hepatic Function Panel     Component Value Date/Time   PROT 7.1 05/22/2019 0941   ALBUMIN 4.3 05/22/2019  0941   AST 74 (H) 05/22/2019 0941   ALT 61 (H) 05/22/2019 0941   ALKPHOS 73 05/22/2019 0941   BILITOT 0.5 05/22/2019 0941   BILIDIR 0.2 03/29/2018 0753      Component Value Date/Time   TSH 3.320 05/22/2019 0941   TSH 5.350 (H) 12/16/2018 1100   TSH 2.98 03/29/2018 0753   Results for KHOL, HEINLE (MRN [id number]) as of 05/27/2019 07:59  Ref. Range 05/22/2019 09:41  Vitamin D, 25-Hydroxy Latest Ref Range: 30.0 - 100.0 ng/mL 59.9   OBESITY BEHAVIORAL INTERVENTION VISIT  Today's visit was #11   Starting weight: 259 lbs Starting date: 12/16/2018 Today's weight: 241 lbs  Today's date: 05/26/2019 Total lbs lost to date: 18    05/26/2019  Height 5\' 11"  (1.803 m)  Weight 241 lb (109.3 kg)  BMI (Calculated) 33.63  BLOOD PRESSURE - SYSTOLIC [id number]  BLOOD PRESSURE - DIASTOLIC 76   Body Fat % 33 %  Total Body Water (lbs) 111 lbs   ASK: We discussed the diagnosis of obesity with [name] today and Deshone agreed to give [place] permission to discuss obesity behavioral modification therapy today.  ASSESS: Ajay has the diagnosis of obesity and his BMI today is 33.6. Primus is in the action stage of change.  ADVISE: Joshuadavid was educated on the multiple health risks of obesity as well as the benefit of weight loss to improve his health. He was advised of the need for long term treatment and the importance of lifestyle modifications to improve his current health and to decrease his risk of future health problems.  AGREE: Multiple dietary modification options and treatment options were discussed and  Jartavious agreed to follow the recommendations documented in the above note.  ARRANGE: Joran was educated on the importance of frequent visits to treat obesity as outlined per CMS and USPSTF guidelines and agreed to schedule his next follow up appointment today.  I, Michaelene [name], am acting as Location manager for [name], MD  I have reviewed the above documentation for accuracy and completeness,  and I agree with the above. -[name], MD

## 2019-05-30 ENCOUNTER — Ambulatory Visit: Payer: Medicare HMO

## 2019-06-03 ENCOUNTER — Ambulatory Visit (INDEPENDENT_AMBULATORY_CARE_PROVIDER_SITE_OTHER): Payer: Medicare HMO | Admitting: *Deleted

## 2019-06-03 ENCOUNTER — Other Ambulatory Visit: Payer: Self-pay

## 2019-06-03 ENCOUNTER — Telehealth: Payer: Self-pay | Admitting: *Deleted

## 2019-06-03 VITALS — BP 146/84 | HR 88 | Resp 17 | Ht 71.0 in | Wt 246.0 lb

## 2019-06-03 DIAGNOSIS — Z0000 Encounter for general adult medical examination without abnormal findings: Secondary | ICD-10-CM | POA: Diagnosis not present

## 2019-06-03 MED ORDER — PANTOPRAZOLE SODIUM 40 MG PO TBEC: 40.0000 mg | DELAYED_RELEASE_TABLET | ORAL | 0 refills | Status: DC | PRN

## 2019-06-03 NOTE — Telephone Encounter (Signed)
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 Patient asked about medication he could take as needed to help. Patient also stated he needed injections in his legs to treat dermatitis and he wanted to know how he could get a new C-PAP machine. He no longer sees the physician who prescribed the C-PAP. Nurse made an appointment with PCP for these issues to be addressed on 06/05/19.

## 2019-06-03 NOTE — Patient Instructions (Addendum)
Continue doing brain stimulating activities (puzzles, reading, adult coloring books, staying active) to keep memory sharp.   Continue to eat heart healthy diet (full of fruits, vegetables, whole grains, lean protein, water--limit salt, fat, and sugar intake) and increase physical activity as tolerated.   [name] , Thank you for taking time to come for your Medicare Wellness Visit. I appreciate your ongoing commitment to your health goals. Please review the following plan we discussed and let me know if I can assist you in the future.   These are the goals we discussed: Goals    . Patient Stated     I want to continue to lose weight. I will continue going to Dr. Leafy Ro and I want to get my knee to feeling better.        This is a list of the screening recommended for you and due dates:  Health Maintenance  Topic Date Due  . Flu Shot  01/01/2020*  . Pneumonia vaccines (1 of 2 - PCV13) 06/02/2020*  . Colon Cancer Screening  06/07/2021  . Tetanus Vaccine  07/07/2023  .  Hepatitis C: One time screening is recommended by Center for Disease Control  (CDC) for  adults born from [birth year] through 1963.   Completed  *Topic was postponed. The date shown is not the original due date.    Preventive Care 24 Years and Older, Male Preventive care refers to lifestyle choices and visits with your health care provider that can promote health and wellness. This includes:  A yearly physical exam. This is also called an annual well check.  Regular dental and eye exams.  Immunizations.  Screening for certain conditions.  Healthy lifestyle choices, such as diet and exercise. What can I expect for my preventive care visit? Physical exam Your health care provider will check:  Height and weight. These may be used to calculate body mass index (BMI), which is a measurement that tells if you are at a healthy weight.  Heart rate and blood pressure.  Your skin for abnormal spots. Counseling Your health  care provider may ask you questions about:  Alcohol, tobacco, and drug use.  Emotional well-being.  Home and relationship well-being.  Sexual activity.  Eating habits.  History of falls.  Memory and ability to understand (cognition).  Work and work Statistician. What immunizations do I need?  Influenza (flu) vaccine  This is recommended every year. Tetanus, diphtheria, and pertussis (Tdap) vaccine  You may need a Td booster every 10 years. Varicella (chickenpox) vaccine  You may need this vaccine if you have not already been vaccinated. Zoster (shingles) vaccine  You may need this after age 8. Pneumococcal conjugate (PCV13) vaccine  One dose is recommended after age 22. Pneumococcal polysaccharide (PPSV23) vaccine  One dose is recommended after age 29. Measles, mumps, and rubella (MMR) vaccine  You may need at least one dose of MMR if you were born in 1957 or later. You may also need a second dose. Meningococcal conjugate (MenACWY) vaccine  You may need this if you have certain conditions. Hepatitis A vaccine  You may need this if you have certain conditions or if you travel or work in places where you may be exposed to hepatitis A. Hepatitis B vaccine  You may need this if you have certain conditions or if you travel or work in places where you may be exposed to hepatitis B. Haemophilus influenzae type b (Hib) vaccine  You may need this if you have certain conditions. You  may receive vaccines as individual doses or as more than one vaccine together in one shot (combination vaccines). Talk with your health care provider about the risks and benefits of combination vaccines. What tests do I need? Blood tests  Lipid and cholesterol levels. These may be checked every 5 years, or more frequently depending on your overall health.  Hepatitis C test.  Hepatitis B test. Screening  Lung cancer screening. You may have this screening every year starting at age 54 if  you have a 30-pack-year history of smoking and currently smoke or have quit within the past 15 years.  Colorectal cancer screening. All adults should have this screening starting at age 70 and continuing until age 50. Your health care provider may recommend screening at age 88 if you are at increased risk. You will have tests every 1-10 years, depending on your results and the type of screening test.  Prostate cancer screening. Recommendations will vary depending on your family history and other risks.  Diabetes screening. This is done by checking your blood sugar (glucose) after you have not eaten for a while (fasting). You may have this done every 1-3 years.  Abdominal aortic aneurysm (AAA) screening. You may need this if you are a current or former smoker.  Sexually transmitted disease (STD) testing. Follow these instructions at home: Eating and drinking  Eat a diet that includes fresh fruits and vegetables, whole grains, lean protein, and low-fat dairy products. Limit your intake of foods with high amounts of sugar, saturated fats, and salt.  Take vitamin and mineral supplements as recommended by your health care provider.  Do not drink alcohol if your health care provider tells you not to drink.  If you drink alcohol: ? Limit how much you have to 0-2 drinks a day. ? Be aware of how much alcohol is in your drink. In the [place] one drink equals one 12 oz bottle of beer (355 mL), one 5 oz glass of wine (148 mL), or one 1 oz glass of hard liquor (44 mL). Lifestyle  Take daily care of your teeth and gums.  Stay active. Exercise for at least 30 minutes on 5 or more days each week.  Do not use any products that contain nicotine or tobacco, such as cigarettes, e-cigarettes, and chewing tobacco. If you need help quitting, ask your health care provider.  If you are sexually active, practice safe sex. Use a condom or other form of protection to prevent STIs (sexually transmitted  infections).  Talk with your health care provider about taking a low-dose aspirin or statin. What's next?  Visit your health care provider once a year for a well check visit.  Ask your health care provider how often you should have your eyes and teeth checked.  Stay up to date on all vaccines. This information is not intended to replace advice given to you by your health care provider. Make sure you discuss any questions you have with your health care provider. Document Released: 10/15/2015 Document Revised: 09/12/2018 Document Reviewed: 09/12/2018 Elsevier Patient Education  2020 [employer].

## 2019-06-03 NOTE — Addendum Note (Signed)
Addended by: Emelia Loron A on: 06/03/2019 02:26 PM   Modules accepted: Level of Service, SmartSet

## 2019-06-03 NOTE — Progress Notes (Addendum)
Subjective:   Zachary Valdez is a 68 y.o. male who presents for Medicare Annual/Subsequent preventive examination.  Review of Systems:   Cardiac Risk Factors include: advanced age (>62men, >17 women);dyslipidemia;male gender;hypertension Home Safety/Smoke Alarms: Feels safe in home. Smoke alarms in place.  Living environment; residence and Firearm Safety: 1-story house/ trailer. Lives alone, no needs for DME, good support system. Seat Belt Safety/Bike Helmet: Wears seat belt.      Objective:    Vitals: BP (!) 146/84   Pulse 88   Resp 17   Ht 5\' 11"  (1.803 m)   Wt 246 lb (111.6 kg)   SpO2 99%   BMI 34.31 kg/m   Body mass index is 34.31 kg/m.  Advanced Directives 06/03/2019 06/07/2016 05/24/2016 09/07/2014 08/31/2014  Does Patient Have a Medical Advance Directive? Yes No Yes Yes Yes  Type of Paramedic of [place];Living will - Living will Living will Living will  Does patient want to make changes to medical advance directive? - - - No - Patient declined No - Patient declined  Copy of [place] in Chart? No - copy requested - No - copy requested No - copy requested No - copy requested  Would patient like information on creating a medical advance directive? - No - patient declined information - - -    Tobacco Social History   Tobacco Use  Smoking Status Former Smoker  . Types: Cigarettes  . Quit date: 10/02/1997  . Years since quitting: 21.6  Smokeless Tobacco Never Used     Counseling given: Not Answered  Past Medical History:  Diagnosis Date  . Allergic rhinitis   . Anxiety   . GERD (gastroesophageal reflux disease)   . Hyperlipidemia   . Hyperplastic colon polyp 2006  . Hypertension   . OA (osteoarthritis)   . Obesity   . Sleep apnea    cpap-   . Ulcerative esophagitis 2006   Dr. Fuller Plan  . Vitamin B 12 deficiency    Past Surgical History:  Procedure Laterality Date  . KNEE ARTHROSCOPY     Right  . TOTAL KNEE  ARTHROPLASTY Left 09/07/2014   Procedure: LEFT TOTAL KNEE ARTHROPLASTY;  Surgeon: Gearlean Alf, MD;  Location: [facility];  Service: Orthopedics;  Laterality: Left;   Family History  Problem Relation Age of Onset  . Breast cancer Mother   . Hypertension Mother   . Hypertension Other   . Colon cancer Neg Hx    Social History   Socioeconomic History  . Marital status: Divorced    Spouse name: Not on file  . Number of children: Not on file  . Years of education: Not on file  . Highest education level: Not on file  Occupational History  . Occupation: retired  Scientific laboratory technician  . Financial resource strain: Not hard at all  . Food insecurity    Worry: Never true    Inability: Never true  . Transportation needs    Medical: No    Non-medical: No  Tobacco Use  . Smoking status: Former Smoker    Types: Cigarettes    Quit date: 10/02/1997    Years since quitting: 21.6  . Smokeless tobacco: Never Used  Substance and Sexual Activity  . Alcohol use: Yes    Alcohol/week: 14.0 standard drinks    Types: 14 Glasses of  per week  . Drug use: No  . Sexual activity: Not Currently  Lifestyle  . Physical activity  Days per week: 4 days    Minutes per session: 30 min  . Stress: Only a little  Relationships  . Social connections    Talks on phone: More than three times a week    Gets together: More than three times a week    Attends religious service: Not on file    Active member of club or organization: Yes    Attends meetings of clubs or organizations: 1 to 4 times per year    Relationship status: Divorced  Other Topics Concern  . Not on file  Social History Narrative   Daily caffeine- 2-3 per day    Outpatient Encounter Medications as of 06/03/2019  Medication Sig  . aspirin EC 81 MG tablet Take 1 tablet (81 mg total) by mouth daily.  . B COMPLEX-C-E PO Take 1 tablet by mouth daily.  . diclofenac (VOLTAREN) 50 MG EC tablet Take 1 tablet (50 mg total) by mouth 2 (two) times daily  as needed.  . metFORMIN (GLUCOPHAGE) 500 MG tablet Take 1 tablet (500 mg total) by mouth daily with breakfast.  . olmesartan (BENICAR) 40 MG tablet TAKE ONE TABLET BY MOUTH DAILY  . pantoprazole (PROTONIX) 40 MG tablet Take 1 tablet (40 mg total) by mouth as needed.  . rosuvastatin (CRESTOR) 40 MG tablet Take 1 tablet (40 mg total) by mouth daily. (Patient taking differently: Take 40 mg by mouth. Take 1/2 tablet by mouth every other day)  . vitamin B-12 (CYANOCOBALAMIN) 1000 MCG tablet Take 1 tablet (1,000 mcg total) by mouth daily.  . Vitamin D, Ergocalciferol, (DRISDOL) 1.25 MG (50000 UT) CAPS capsule Take 1 capsule (50,000 Units total) by mouth every 14 (fourteen) days.  . [DISCONTINUED] pantoprazole (PROTONIX) 40 MG tablet Take 40 mg by mouth as needed.  . tadalafil (CIALIS) 20 MG tablet Take 1 tablet (20 mg total) by mouth daily as needed for erectile dysfunction.  . [DISCONTINUED] buPROPion (WELLBUTRIN SR) 150 MG 12 hr tablet Take 1 tablet (150 mg total) by mouth daily. (Patient not taking: Reported on 06/03/2019)   No facility-administered encounter medications on file as of 06/03/2019.     Activities of Daily Living In your present state of health, do you have any difficulty performing the following activities: 06/03/2019  Hearing? N  Vision? N  Difficulty concentrating or making decisions? N  Walking or climbing stairs? N  Dressing or bathing? N  Doing errands, shopping? N  Preparing Food and eating ? N  Using the Toilet? N  In the past six months, have you accidently leaked urine? N  Do you have problems with loss of bowel control? N  Managing your Medications? N  Managing your Finances? N  Housekeeping or managing your Housekeeping? N  Some recent data might be hidden    Patient Care Team: Plotnikov, Evie Lacks, MD as PCP - General   Assessment:   This is a routine wellness examination for Zachary Valdez. Physical assessment deferred to PCP.   Exercise Activities and Dietary  recommendations Current Exercise Habits: Home exercise routine, Type of exercise: walking, Time (Minutes): 30, Frequency (Times/Week): 4, Weekly Exercise (Minutes/Week): 120, Exercise limited by: orthopedic condition(s)  Diet (meal preparation, eat out, water intake, caffeinated beverages, dairy products, fruits and vegetables): in general, a "healthy" diet  , well balanced Followed by Dr. Leafy Ro eats a variety of fruits and vegetables daily, limits salt, fat/cholesterol, sugar,carbohydrates,caffeine, drinks 6-8 glasses of water daily.  Goals    . Patient Stated     I  want to continue to lose weight. I will continue going to Dr. Leafy Ro and I want to get my knee to feeling better.        Fall Risk Fall Risk  06/03/2019 03/12/2018  Falls in the past year? 0 No  Number falls in past yr: 0 -  Injury with Fall? 0 -    Depression Screen PHQ 2/9 Scores 06/03/2019 12/16/2018 03/12/2018 10/04/2017  PHQ - 2 Score 1 3 0 0  PHQ- 9 Score - 10 - -    Cognitive Function       Ad8 score reviewed for issues:  Issues making decisions: no  Less interest in hobbies / activities: no  Repeats questions, stories (family complaining): no  Trouble using ordinary gadgets (microwave, computer, phone):no  Forgets the month or year: no  Mismanaging finances: no  Remembering appts: no  Daily problems with thinking and/or memory: no Ad8 score is= 0  Immunization History  Administered Date(s) Administered  . Influenza Whole 10/03/2005, 07/03/2011  . Influenza-Unspecified 07/10/2016, 07/19/2017, 07/04/2018  . Td 04/30/1998, 10/02/2006  . Tdap 07/06/2013   Screening Tests Health Maintenance  Topic Date Due  . INFLUENZA VACCINE  01/01/2020 (Originally 05/03/2019)  . PNA vac Low Risk Adult (1 of 2 - PCV13) 06/02/2020 (Originally 02/04/2018)  . COLONOSCOPY  06/07/2021  . TETANUS/TDAP  07/07/2023  . Hepatitis C Screening  Completed        Plan:    Reviewed health maintenance screenings with  patient today and relevant education, vaccines, and/or referrals were provided.   I have personally reviewed and noted the following in the patient's chart:   . Medical and social history . Use of alcohol, tobacco or illicit drugs  . Current medications and supplements . Functional ability and status . Nutritional status . Physical activity . Advanced directives . List of other physicians . Vitals . Screenings to include cognitive, depression, and falls . Referrals and appointments  In addition, I have reviewed and discussed with patient certain preventive protocols, quality metrics, and best practice recommendations. A written personalized care plan for preventive services as well as general preventive health recommendations were provided to patient.     Michiel Cowboy, RN  06/03/2019  Medical screening examination/treatment/procedure(s) were performed by non-physician practitioner and as supervising physician I was immediately available for consultation/collaboration. I agree with above. Lew Dawes, MD

## 2019-06-04 NOTE — Telephone Encounter (Signed)
Noted! Thank you

## 2019-06-05 ENCOUNTER — Other Ambulatory Visit: Payer: Self-pay

## 2019-06-05 ENCOUNTER — Encounter: Payer: Self-pay | Admitting: Internal Medicine

## 2019-06-05 ENCOUNTER — Ambulatory Visit (INDEPENDENT_AMBULATORY_CARE_PROVIDER_SITE_OTHER): Payer: Medicare HMO | Admitting: Internal Medicine

## 2019-06-05 DIAGNOSIS — N528 Other male erectile dysfunction: Secondary | ICD-10-CM

## 2019-06-05 DIAGNOSIS — Z6835 Body mass index (BMI) 35.0-35.9, adult: Secondary | ICD-10-CM

## 2019-06-05 DIAGNOSIS — I2583 Coronary atherosclerosis due to lipid rich plaque: Secondary | ICD-10-CM

## 2019-06-05 DIAGNOSIS — E538 Deficiency of other specified B group vitamins: Secondary | ICD-10-CM

## 2019-06-05 DIAGNOSIS — I10 Essential (primary) hypertension: Secondary | ICD-10-CM | POA: Diagnosis not present

## 2019-06-05 DIAGNOSIS — G4733 Obstructive sleep apnea (adult) (pediatric): Secondary | ICD-10-CM | POA: Diagnosis not present

## 2019-06-05 DIAGNOSIS — D485 Neoplasm of uncertain behavior of skin: Secondary | ICD-10-CM

## 2019-06-05 DIAGNOSIS — L439 Lichen planus, unspecified: Secondary | ICD-10-CM | POA: Diagnosis not present

## 2019-06-05 DIAGNOSIS — Z1589 Genetic susceptibility to other disease: Secondary | ICD-10-CM | POA: Diagnosis not present

## 2019-06-05 DIAGNOSIS — I2510 Atherosclerotic heart disease of native coronary artery without angina pectoris: Secondary | ICD-10-CM

## 2019-06-05 DIAGNOSIS — F419 Anxiety disorder, unspecified: Secondary | ICD-10-CM | POA: Insufficient documentation

## 2019-06-05 DIAGNOSIS — Z9989 Dependence on other enabling machines and devices: Secondary | ICD-10-CM

## 2019-06-05 MED ORDER — METHYLPREDNISOLONE ACETATE 40 MG/ML IJ SUSP
40.0000 mg | Freq: Once | INTRAMUSCULAR | Status: AC
  Administered 2019-06-05: 40 mg

## 2019-06-05 MED ORDER — ALPRAZOLAM 0.5 MG PO TABS: 0.5000 mg | ORAL_TABLET | Freq: Three times a day (TID) | ORAL | 2 refills | Status: AC | PRN

## 2019-06-05 NOTE — Assessment & Plan Note (Signed)
Xanax prn - rare  Potential benefits of a long term benzodiazepines  use as well as potential risks  and complications were explained to the patient and were aknowledged. 

## 2019-06-05 NOTE — Assessment & Plan Note (Signed)
Needs to be re-evaluated for CPAP Dr Elsworth Soho

## 2019-06-05 NOTE — Progress Notes (Signed)
Subjective:  Patient ID: Zachary Valdez, male    DOB: [birth year]  Age: 68 y.o. MRN: [id number]  CC: No chief complaint on file.   HPI LAMARION SPEDDING presents for OSA - needs a new machine F/u skin lesions F/u DM 2 C/o anxiety   Outpatient Medications Prior to Visit  Medication Sig Dispense Refill  . aspirin EC 81 MG tablet Take 1 tablet (81 mg total) by mouth daily. 100 tablet 3  . B COMPLEX-C-E PO Take 1 tablet by mouth daily.    . diclofenac (VOLTAREN) 50 MG EC tablet Take 1 tablet (50 mg total) by mouth 2 (two) times daily as needed. 180 tablet 2  . metFORMIN (GLUCOPHAGE) 500 MG tablet Take 1 tablet (500 mg total) by mouth daily with breakfast. 30 tablet 0  . olmesartan (BENICAR) 40 MG tablet TAKE ONE TABLET BY MOUTH DAILY 90 tablet 3  . pantoprazole (PROTONIX) 40 MG tablet Take 1 tablet (40 mg total) by mouth as needed. 90 tablet 0  . rosuvastatin (CRESTOR) 40 MG tablet Take 1 tablet (40 mg total) by mouth daily. (Patient taking differently: Take 40 mg by mouth. Take 1/2 tablet by mouth every other day) 90 tablet 3  . vitamin B-12 (CYANOCOBALAMIN) 1000 MCG tablet Take 1 tablet (1,000 mcg total) by mouth daily. 100 tablet 3  . Vitamin D, Ergocalciferol, (DRISDOL) 1.25 MG (50000 UT) CAPS capsule Take 1 capsule (50,000 Units total) by mouth every 14 (fourteen) days. 2 capsule 0  . tadalafil (CIALIS) 20 MG tablet Take 1 tablet (20 mg total) by mouth daily as needed for erectile dysfunction. 30 tablet 3   No facility-administered medications prior to visit.     ROS: Review of Systems  Constitutional: Negative for appetite change, fatigue and unexpected weight change.  HENT: Negative for congestion, nosebleeds, sneezing, sore throat and trouble swallowing.   Eyes: Negative for itching and visual disturbance.  Respiratory: Negative for cough.   Cardiovascular: Negative for chest pain, palpitations and leg swelling.  Gastrointestinal: Negative for abdominal distention, blood in stool,  diarrhea and nausea.  Genitourinary: Negative for frequency and hematuria.  Musculoskeletal: Positive for arthralgias. Negative for back pain, gait problem, joint swelling and neck pain.  Skin: Positive for color change and rash.  Neurological: Negative for dizziness, tremors, speech difficulty and weakness.  Psychiatric/Behavioral: Negative for agitation, dysphoric mood, sleep disturbance and suicidal ideas. The patient is not nervous/anxious.     Objective:  Ht 5\' 11"  (1.803 m)   Wt 246 lb (111.6 kg)   BMI 34.31 kg/m   BP Readings from Last 3 Encounters:  06/03/19 (!) 146/84  05/26/19 132/76  05/12/19 121/72    Wt Readings from Last 3 Encounters:  06/05/19 246 lb (111.6 kg)  06/03/19 246 lb (111.6 kg)  05/26/19 241 lb (109.3 kg)    Physical Exam Constitutional:      General: He is not in acute distress.    Appearance: He is well-developed.     Comments: NAD  Eyes:     Conjunctiva/sclera: Conjunctivae normal.     Pupils: Pupils are equal, round, and reactive to light.  Neck:     Musculoskeletal: Normal range of motion.     Thyroid: No thyromegaly.     Vascular: No JVD.  Cardiovascular:     Rate and Rhythm: Normal rate and regular rhythm.     Heart sounds: Normal heart sounds. No murmur. No friction rub. No gallop.   Pulmonary:     Effort:  Pulmonary effort is normal. No respiratory distress.     Breath sounds: Normal breath sounds. No wheezing or rales.  Chest:     Chest wall: No tenderness.  Abdominal:     General: Bowel sounds are normal. There is no distension.     Palpations: Abdomen is soft. There is no mass.     Tenderness: There is no abdominal tenderness. There is no guarding or rebound.  Musculoskeletal: Normal range of motion.        General: No tenderness.  Lymphadenopathy:     Cervical: No cervical adenopathy.  Skin:    General: Skin is warm and dry.     Findings: No rash.  Neurological:     Mental Status: He is alert and oriented to person,  place, and time.     Cranial Nerves: No cranial nerve deficit.     Motor: No abnormal muscle tone.     Coordination: Coordination normal.     Gait: Gait normal.     Deep Tendon Reflexes: Reflexes are normal and symmetric.  Psychiatric:        Behavior: Behavior normal.        Thought Content: Thought content normal.        Judgment: Judgment normal.   excoriations L>R LEs Obese   Procedure: skin lesion steroid injection Reason: skin lesion, symptomatic Verbal consent was obtained.  Skin was cleaned with betadine and alcohol.  L leg lesion was injected with 10 mg of DepoMedrol and 0.5 cc 1% Lidocaine each (x4).  Tolerated well Complications none Band-aid applied  Lab Results  Component Value Date   WBC 7.8 12/16/2018   HGB 15.1 12/16/2018   HCT 43.6 12/16/2018   PLT 164.0 03/29/2018   GLUCOSE 100 (H) 05/22/2019   CHOL 193 05/22/2019   TRIG 149 05/22/2019   HDL 54 05/22/2019   LDLDIRECT 146.2 10/07/2010   LDLCALC 109 (H) 05/22/2019   ALT 61 (H) 05/22/2019   AST 74 (H) 05/22/2019   NA 140 05/22/2019   K 4.6 05/22/2019   CL 103 05/22/2019   CREATININE 0.89 05/22/2019   BUN 18 05/22/2019   CO2 21 05/22/2019   TSH 3.320 05/22/2019   PSA 0.59 03/29/2018   INR 1.02 08/31/2014   HGBA1C 5.8 (H) 05/22/2019    Dg Knee Complete 4 Views Right  Result Date: 12/04/2018 CLINICAL DATA:  Chronic right knee pain for 5 years. No known injury. EXAM: RIGHT KNEE - COMPLETE 4+ VIEW COMPARISON:  None. FINDINGS: Tricompartment degenerative changes with joint space narrowing and spurring, most pronounced in the medial and patellofemoral compartments. No significant joint effusion. No acute bony abnormality. Specifically, no fracture, subluxation, or dislocation. Vascular calcifications noted. IMPRESSION: Moderate tricompartment degenerative changes, most pronounced in the medial and patellofemoral compartments. No acute bony abnormality. Electronically Signed   By: Rolm Baptise M.D.   On:  12/04/2018 08:42    Assessment & Plan:   There are no diagnoses linked to this encounter.   No orders of the defined types were placed in this encounter.    Follow-up: No follow-ups on file.  Walker Kehr, MD

## 2019-06-05 NOTE — Assessment & Plan Note (Signed)
Crestor ASA 

## 2019-06-05 NOTE — Patient Instructions (Signed)
Zachary Valdez

## 2019-06-05 NOTE — Addendum Note (Signed)
Addended by: Cresenciano Lick on: 06/05/2019 03:28 PM   Modules accepted: Orders

## 2019-06-05 NOTE — Assessment & Plan Note (Signed)
On B12 

## 2019-06-05 NOTE — Assessment & Plan Note (Addendum)
Diet discussed Wt Readings from Last 3 Encounters:  06/05/19 246 lb (111.6 kg)  06/03/19 246 lb (111.6 kg)  05/26/19 241 lb (109.3 kg)

## 2019-06-05 NOTE — Assessment & Plan Note (Signed)
Urology ref 2020

## 2019-06-05 NOTE — Assessment & Plan Note (Addendum)
Edarbi or Olmesartan - suboptimal BP

## 2019-06-05 NOTE — Assessment & Plan Note (Signed)
See procedure Emelda Brothers

## 2019-06-12 ENCOUNTER — Ambulatory Visit (INDEPENDENT_AMBULATORY_CARE_PROVIDER_SITE_OTHER): Payer: Medicare HMO | Admitting: Family Medicine

## 2019-06-12 ENCOUNTER — Other Ambulatory Visit: Payer: Self-pay

## 2019-06-12 ENCOUNTER — Encounter (INDEPENDENT_AMBULATORY_CARE_PROVIDER_SITE_OTHER): Payer: Self-pay | Admitting: Family Medicine

## 2019-06-12 VITALS — BP 145/75 | HR 89 | Temp 98.0°F | Ht 71.0 in | Wt 241.0 lb

## 2019-06-12 DIAGNOSIS — F418 Other specified anxiety disorders: Secondary | ICD-10-CM | POA: Diagnosis not present

## 2019-06-12 DIAGNOSIS — Z6833 Body mass index (BMI) 33.0-33.9, adult: Secondary | ICD-10-CM | POA: Diagnosis not present

## 2019-06-12 DIAGNOSIS — E669 Obesity, unspecified: Secondary | ICD-10-CM | POA: Diagnosis not present

## 2019-06-12 DIAGNOSIS — E559 Vitamin D deficiency, unspecified: Secondary | ICD-10-CM

## 2019-06-12 DIAGNOSIS — R7303 Prediabetes: Secondary | ICD-10-CM

## 2019-06-12 MED ORDER — VITAMIN D (ERGOCALCIFEROL) 1.25 MG (50000 UNIT) PO CAPS: 50000.0000 [IU] | ORAL_CAPSULE | ORAL | 0 refills | Status: DC

## 2019-06-12 MED ORDER — METFORMIN HCL 500 MG PO TABS: 500.0000 mg | ORAL_TABLET | Freq: Every day | ORAL | 0 refills | Status: DC

## 2019-06-12 NOTE — Progress Notes (Signed)
Office: [phone]  /  Fax: [phone]   HPI:   Chief Complaint: OBESITY Zachary Valdez is here to discuss his progress with his obesity treatment plan. He is on the Category 3 plan and is following his eating plan approximately 70% of the time. He states he is walking/mowing yard 20 minutes 5 times per week. Zachary Valdez is stable on his Category 3 plan but has not been eating all of his food and started to deviate from his plan more. He would like to discuss other options.  His weight is 241 lb (109.3 kg) today and has not lost weight since his last visit. He has lost 18 lbs since starting treatment with [place].  Pre-Diabetes Zachary Valdez has a diagnosis of prediabetes based on his elevated Hgb A1c and was informed this puts him at greater risk of developing diabetes. He is stable on metformin currently and continues to work on diet and exercise to decrease risk of diabetes. He denies nausea, vomiting, or hypoglycemia.  Vitamin D deficiency Zachary Valdez has a diagnosis of Vitamin D deficiency, which is not yet at goal. He is currently stable on prescription Vit D and denies nausea, vomiting or muscle weakness.  Depression with emotional eating behaviors Zachary Valdez is struggling with emotional eating and using food for comfort to the extent that it is negatively impacting his health. He often snacks when he is not hungry. Zachary Valdez sometimes feels he is out of control and then feels guilty that he made poor food choices. He has been working on behavior modification techniques to help reduce his emotional eating and has been somewhat successful. Zachary Valdez started Wellbutrin and felt his anxiety increased so he stopped. He feels he is able to decrease emotional eating on his own. He shows no sign of suicidal or homicidal ideations.  Depression screen Zachary Valdez 2/9 06/03/2019 12/16/2018 03/12/2018 10/04/2017  Decreased Interest 0 2 0 0  Down, Depressed, Hopeless 1 1 0 0  PHQ - 2 Score 1 3 0 0  Altered sleeping - 0 - -  Tired, decreased energy -  2 - -  Change in appetite - 0 - -  Feeling bad or failure about yourself  - 1 - -  Trouble concentrating - 1 - -  Moving slowly or fidgety/restless - 3 - -  Suicidal thoughts - 0 - -  PHQ-9 Score - 10 - -  Difficult doing work/chores - Somewhat difficult - -   ASSESSMENT AND PLAN:  Prediabetes - Plan: metFORMIN (GLUCOPHAGE) 500 MG tablet  Vitamin D deficiency - Plan: Vitamin D, Ergocalciferol, (DRISDOL) 1.25 MG (50000 UT) CAPS capsule  Depression with anxiety  Class 1 obesity with serious comorbidity and body mass index (BMI) of 33.0 to 33.9 in adult, unspecified obesity type  PLAN:  Pre-Diabetes Zachary Valdez will continue to work on weight loss, exercise, and decreasing simple carbohydrates in his diet to help decrease the risk of diabetes. We dicussed metformin including benefits and risks. He was informed that eating too many simple carbohydrates or too many calories at one sitting increases the likelihood of GI side effects. Zachary Valdez was given a refill on his metformin 500 mg #30 with 0 refills. He will continue diet and follow-up with our clinic in 2-3 weeks.  Vitamin D Deficiency Zachary Valdez was informed that low Vitamin D levels contributes to fatigue and are associated with obesity, breast, and colon cancer. He agrees to continue to take prescription Vit D @ 50,000 IU every 14 days #2 with 0 refills and will follow-up for routine  testing of Vitamin D, at least 2-3 times per year. He was informed of the risk of over-replacement of Vitamin D and agrees to not increase his dose unless he discusses this with [place] first. Zachary Valdez agrees to follow-up with our clinic in 2-3 weeks.  Depression with Emotional Eating Behaviors We discussed behavior modification techniques today to help Zachary Valdez deal with his emotional eating and depression. Zachary Valdez will discontinue Wellbutrin and will follow-up with [place] as directed to monitor his progress.  Obesity Zachary Valdez is currently in the action stage of change. As such, his  goal is to continue with weight loss efforts. He has agreed to keep a food journal with 1400-1700 calories and 90+ grams of protein.  Zachary Valdez has been instructed to work up to a goal of 150 minutes of combined cardio and strengthening exercise per week for weight loss and overall health benefits. We discussed the following Behavioral Modification Strategies today: increasing lean protein intake, decreasing simple carbohydrates, emotional eating strategies, and keep a strict food journal.  Zachary Valdez has agreed to follow-up with our clinic in 2-3 weeks. He was informed of the importance of frequent follow-up visits to maximize his success with intensive lifestyle modifications for his multiple health conditions.  ALLERGIES: Allergies  Allergen Reactions  . Atacand [Candesartan Cilexetil]   . Atorvastatin     REACTION: achy  . Enalapril     cough  . Hydrocodone-Acetaminophen     REACTION: SOB, patient states drug made patient feel weird   . Losartan   . Tramadol     headache  . Wellbutrin [Bupropion]     MEDICATIONS: Current Outpatient Medications on File Prior to Visit  Medication Sig Dispense Refill  . ALPRAZolam (XANAX) 0.5 MG tablet Take 1 tablet (0.5 mg total) by mouth 3 (three) times daily as needed for anxiety. 60 tablet 2  . aspirin EC 81 MG tablet Take 1 tablet (81 mg total) by mouth daily. 100 tablet 3  . B COMPLEX-C-E PO Take 1 tablet by mouth daily.    . diclofenac (VOLTAREN) 50 MG EC tablet Take 1 tablet (50 mg total) by mouth 2 (two) times daily as needed. 180 tablet 2  . olmesartan (BENICAR) 40 MG tablet TAKE ONE TABLET BY MOUTH DAILY 90 tablet 3  . pantoprazole (PROTONIX) 40 MG tablet Take 1 tablet (40 mg total) by mouth as needed. 90 tablet 0  . rosuvastatin (CRESTOR) 40 MG tablet Take 1 tablet (40 mg total) by mouth daily. (Patient taking differently: Take 40 mg by mouth. Take 1/2 tablet by mouth every other day) 90 tablet 3  . vitamin B-12 (CYANOCOBALAMIN) 1000 MCG tablet  Take 1 tablet (1,000 mcg total) by mouth daily. 100 tablet 3  . tadalafil (CIALIS) 20 MG tablet Take 1 tablet (20 mg total) by mouth daily as needed for erectile dysfunction. 30 tablet 3   No current facility-administered medications on file prior to visit.     PAST MEDICAL HISTORY: Past Medical History:  Diagnosis Date  . Allergic rhinitis   . Anxiety   . GERD (gastroesophageal reflux disease)   . Hyperlipidemia   . Hyperplastic colon polyp 2006  . Hypertension   . OA (osteoarthritis)   . Obesity   . Sleep apnea    cpap-   . Ulcerative esophagitis 2006   Dr. Fuller Plan  . Vitamin B 12 deficiency     PAST SURGICAL HISTORY: Past Surgical History:  Procedure Laterality Date  . KNEE ARTHROSCOPY     Right  .  TOTAL KNEE ARTHROPLASTY Left 09/07/2014   Procedure: LEFT TOTAL KNEE ARTHROPLASTY;  Surgeon: Gearlean Alf, MD;  Location: [facility];  Service: Orthopedics;  Laterality: Left;    SOCIAL HISTORY: Social History   Tobacco Use  . Smoking status: Former Smoker    Types: Cigarettes    Quit date: 10/02/1997    Years since quitting: 21.7  . Smokeless tobacco: Never Used  Substance Use Topics  . Alcohol use: Yes    Alcohol/week: 14.0 standard drinks    Types: 14 Glasses of wine per week  . Drug use: No    FAMILY HISTORY: Family History  Problem Relation Age of Onset  . Breast cancer Mother   . Hypertension Mother   . Hypertension Other   . Colon cancer Neg Hx    ROS: Review of Systems  Gastrointestinal: Negative for nausea and vomiting.  Musculoskeletal:       Negative for muscle weakness.  Endo/Heme/Allergies:       Negative for hypoglycemia.  Psychiatric/Behavioral: Positive for depression. Negative for suicidal ideas. The patient is nervous/anxious.        Negative for homicidal ideas.   PHYSICAL EXAM: Blood pressure (!) 145/75, pulse 89, temperature 98 F (36.7 C), temperature source Oral, height 5\' 11"  (1.803 m), weight 241 lb (109.3 kg), SpO2 95 %. Body mass  index is 33.61 kg/m. Physical Exam Vitals signs reviewed.  Constitutional:      Appearance: Normal appearance. He is obese.  Cardiovascular:     Rate and Rhythm: Normal rate.     Pulses: Normal pulses.  Pulmonary:     Effort: Pulmonary effort is normal.     Breath sounds: Normal breath sounds.  Musculoskeletal: Normal range of motion.  Skin:    General: Skin is warm and dry.  Neurological:     Mental Status: He is alert and oriented to person, place, and time.  Psychiatric:        Behavior: Behavior normal.   RECENT LABS AND TESTS: BMET    Component Value Date/Time   NA 140 05/22/2019 0941   K 4.6 05/22/2019 0941   CL 103 05/22/2019 0941   CO2 21 05/22/2019 0941   GLUCOSE 100 (H) 05/22/2019 0941   GLUCOSE 109 (H) 03/29/2018 0753   BUN 18 05/22/2019 0941   CREATININE 0.89 05/22/2019 0941   CALCIUM 9.2 05/22/2019 0941   GFRNONAA 89 05/22/2019 0941   GFRAA 103 05/22/2019 0941   Lab Results  Component Value Date   HGBA1C 5.8 (H) 05/22/2019   HGBA1C 5.9 (H) 12/16/2018   HGBA1C 6.0 10/05/2017   HGBA1C 6.0 03/29/2017   HGBA1C 6.0 03/10/2016   Lab Results  Component Value Date   INSULIN 23.0 05/22/2019   INSULIN 45.8 (H) 12/16/2018   CBC    Component Value Date/Time   WBC 7.8 12/16/2018 1100   WBC 6.2 03/29/2018 0753   RBC 4.20 12/16/2018 1100   RBC 4.29 03/29/2018 0753   HGB 15.1 12/16/2018 1100   HCT 43.6 12/16/2018 1100   PLT 164.0 03/29/2018 0753   MCV 104 (H) 12/16/2018 1100   MCH 36.0 (H) 12/16/2018 1100   MCH 33.9 09/09/2014 0510   MCHC 34.6 12/16/2018 1100   MCHC 34.2 03/29/2018 0753   RDW 11.4 (L) 12/16/2018 1100   LYMPHSABS 1.2 12/16/2018 1100   MONOABS 0.8 03/29/2018 0753   EOSABS 0.2 12/16/2018 1100   BASOSABS 0.0 12/16/2018 1100   Iron/TIBC/Ferritin/ %Sat No results found for: IRON, TIBC, FERRITIN, IRONPCTSAT Lipid Panel  Component Value Date/Time   CHOL 193 05/22/2019 0941   TRIG 149 05/22/2019 0941   HDL 54 05/22/2019 0941    CHOLHDL 4 03/29/2018 0753   VLDL 22.2 03/29/2018 0753   LDLCALC 109 (H) 05/22/2019 0941   LDLDIRECT 146.2 10/07/2010 0814   Hepatic Function Panel     Component Value Date/Time   PROT 7.1 05/22/2019 0941   ALBUMIN 4.3 05/22/2019 0941   AST 74 (H) 05/22/2019 0941   ALT 61 (H) 05/22/2019 0941   ALKPHOS 73 05/22/2019 0941   BILITOT 0.5 05/22/2019 0941   BILIDIR 0.2 03/29/2018 0753      Component Value Date/Time   TSH 3.320 05/22/2019 0941   TSH 5.350 (H) 12/16/2018 1100   TSH 2.98 03/29/2018 0753   Results for ZADOK, GROMAN (MRN [id number]) as of 06/12/2019 11:24  Ref. Range 05/22/2019 09:41  Vitamin D, 25-Hydroxy Latest Ref Range: 30.0 - 100.0 ng/mL 59.9   OBESITY BEHAVIORAL INTERVENTION VISIT  Today's visit was #12  Starting weight: 259 lbs Starting date: 12/16/2018 Today's weight: 241 lbs  Today's date: 06/12/2019 Total lbs lost to date: 18 At least 15 minutes were spent on discussing the following behavioral intervention visit.    06/12/2019  Height 5\' 11"  (1.803 m)  Weight 241 lb (109.3 kg)  BMI (Calculated) 33.63  BLOOD PRESSURE - SYSTOLIC [id number]  BLOOD PRESSURE - DIASTOLIC 75   Body Fat % [id number] %  Total Body Water (lbs) 107.2 lbs   ASK: We discussed the diagnosis of obesity with [name] today and Kord agreed to give [place] permission to discuss obesity behavioral modification therapy today.  ASSESS: Deylin has the diagnosis of obesity and his BMI today is 33.7. Jahsiah is in the action stage of change.   ADVISE: Alhassan was educated on the multiple health risks of obesity as well as the benefit of weight loss to improve his health. He was advised of the need for long term treatment and the importance of lifestyle modifications to improve his current health and to decrease his risk of future health problems.  AGREE: Multiple dietary modification options and treatment options were discussed and  Jadriel agreed to follow the recommendations documented in the above note.   ARRANGE: Dash was educated on the importance of frequent visits to treat obesity as outlined per CMS and USPSTF guidelines and agreed to schedule his next follow up appointment today.  I, Michaelene [name], am acting as Location manager for [name], MD I have reviewed the above documentation for accuracy and completeness, and I agree with the above. -[name], MD

## 2019-06-15 ENCOUNTER — Other Ambulatory Visit: Payer: Self-pay | Admitting: Internal Medicine

## 2019-06-18 ENCOUNTER — Ambulatory Visit: Payer: Self-pay | Admitting: Internal Medicine

## 2019-06-26 ENCOUNTER — Ambulatory Visit (INDEPENDENT_AMBULATORY_CARE_PROVIDER_SITE_OTHER): Payer: Medicare HMO | Admitting: Family Medicine

## 2019-06-30 ENCOUNTER — Ambulatory Visit (INDEPENDENT_AMBULATORY_CARE_PROVIDER_SITE_OTHER): Payer: Medicare HMO | Admitting: Family Medicine

## 2019-06-30 ENCOUNTER — Other Ambulatory Visit: Payer: Self-pay

## 2019-06-30 VITALS — BP 125/69 | HR 84 | Temp 98.3°F | Ht 71.0 in | Wt 239.0 lb

## 2019-06-30 DIAGNOSIS — E559 Vitamin D deficiency, unspecified: Secondary | ICD-10-CM | POA: Diagnosis not present

## 2019-06-30 DIAGNOSIS — E669 Obesity, unspecified: Secondary | ICD-10-CM

## 2019-06-30 DIAGNOSIS — Z6833 Body mass index (BMI) 33.0-33.9, adult: Secondary | ICD-10-CM

## 2019-06-30 MED ORDER — VITAMIN D (ERGOCALCIFEROL) 1.25 MG (50000 UNIT) PO CAPS: 50000.0000 [IU] | ORAL_CAPSULE | ORAL | 0 refills | Status: DC

## 2019-07-01 NOTE — Progress Notes (Signed)
Office: [phone]  /  Fax: [phone]   HPI:   Chief Complaint: OBESITY Zachary Valdez is here to discuss his progress with his obesity treatment plan. He is on the keep a food journal with 1400-1700 calories and 90+ grams of protein daily and is following his eating plan approximately 60-70 % of the time. He states he is walking for 30 minutes 5 times per week. Zachary Valdez continues to do well with weight loss. He is not eating all of the food on his meal plan, but his hunger is controlled.  His weight is 239 lb (108.4 kg) today and has had a weight loss of 2 pounds over a period of 2 to 3 weeks since his last visit. He has lost 20 lbs since starting treatment with [place].  Vitamin D Deficiency Zachary Valdez has a diagnosis of vitamin D deficiency. He is stable on prescription Vit D, and level is now at goal. He denies nausea, vomiting or muscle weakness.  ASSESSMENT AND PLAN:  Vitamin D deficiency - Plan: Vitamin D, Ergocalciferol, (DRISDOL) 1.25 MG (50000 UT) CAPS capsule  Class 1 obesity with serious comorbidity and body mass index (BMI) of 33.0 to 33.9 in adult, unspecified obesity type  PLAN:  Vitamin D Deficiency Zachary Valdez was informed that low vitamin D levels contributes to fatigue and are associated with obesity, breast, and colon cancer. Zachary Valdez agrees to change prescription Vit D to 50,000 IU every 14 days #2 and we will refill for 1 month. He will follow up for routine testing of vitamin D, at least 2-3 times per year. He was informed of the risk of over-replacement of vitamin D and agrees to not increase his dose unless he discusses this with [place] first. Zachary Valdez agrees to follow up with our clinic in 3 weeks.  Obesity Zachary Valdez is currently in the action stage of change. As such, his goal is to continue with weight loss efforts He has agreed to follow the Category 2 plan or follow the Category 3 plan Zachary Valdez has been instructed to work up to a goal of 150 minutes of combined cardio and strengthening exercise  per week for weight loss and overall health benefits. We discussed the following Behavioral Modification Strategies today: increasing lean protein intake and decreasing simple carbohydrates    Zachary Valdez has agreed to follow up with our clinic in 3 weeks. He was informed of the importance of frequent follow up visits to maximize his success with intensive lifestyle modifications for his multiple health conditions.  ALLERGIES: Allergies  Allergen Reactions  . Atacand [Candesartan Cilexetil]   . Atorvastatin     REACTION: achy  . Enalapril     cough  . Hydrocodone-Acetaminophen     REACTION: SOB, patient states drug made patient feel weird   . Losartan   . Tramadol     headache  . Wellbutrin [Bupropion]     MEDICATIONS: Current Outpatient Medications on File Prior to Visit  Medication Sig Dispense Refill  . ALPRAZolam (XANAX) 0.5 MG tablet Take 1 tablet (0.5 mg total) by mouth 3 (three) times daily as needed for anxiety. 60 tablet 2  . aspirin EC 81 MG tablet Take 1 tablet (81 mg total) by mouth daily. 100 tablet 3  . B COMPLEX-C-E PO Take 1 tablet by mouth daily.    . diclofenac (VOLTAREN) 50 MG EC tablet Take 1 tablet (50 mg total) by mouth 2 (two) times daily as needed. 180 tablet 2  . metFORMIN (GLUCOPHAGE) 500 MG tablet Take 1 tablet (  500 mg total) by mouth daily with breakfast. 30 tablet 0  . olmesartan (BENICAR) 40 MG tablet TAKE ONE TABLET BY MOUTH DAILY 90 tablet 3  . pantoprazole (PROTONIX) 40 MG tablet Take 1 tablet (40 mg total) by mouth as needed. 90 tablet 0  . rosuvastatin (CRESTOR) 40 MG tablet TAKE ONE TABLET BY MOUTH DAILY 90 tablet 2  . vitamin B-12 (CYANOCOBALAMIN) 1000 MCG tablet Take 1 tablet (1,000 mcg total) by mouth daily. 100 tablet 3  . tadalafil (CIALIS) 20 MG tablet Take 1 tablet (20 mg total) by mouth daily as needed for erectile dysfunction. 30 tablet 3   No current facility-administered medications on file prior to visit.     PAST MEDICAL HISTORY: Past  Medical History:  Diagnosis Date  . Allergic rhinitis   . Anxiety   . GERD (gastroesophageal reflux disease)   . Hyperlipidemia   . Hyperplastic colon polyp 2006  . Hypertension   . OA (osteoarthritis)   . Obesity   . Sleep apnea    cpap-   . Ulcerative esophagitis 2006   Dr. Fuller Plan  . Vitamin B 12 deficiency     PAST SURGICAL HISTORY: Past Surgical History:  Procedure Laterality Date  . KNEE ARTHROSCOPY     Right  . TOTAL KNEE ARTHROPLASTY Left 09/07/2014   Procedure: LEFT TOTAL KNEE ARTHROPLASTY;  Surgeon: Gearlean Alf, MD;  Location: [facility];  Service: Orthopedics;  Laterality: Left;    SOCIAL HISTORY: Social History   Tobacco Use  . Smoking status: Former Smoker    Types: Cigarettes    Quit date: 10/02/1997    Years since quitting: 21.7  . Smokeless tobacco: Never Used  Substance Use Topics  . Alcohol use: Yes    Alcohol/week: 14.0 standard drinks    Types: 14 Glasses of wine per week  . Drug use: No    FAMILY HISTORY: Family History  Problem Relation Age of Onset  . Breast cancer Mother   . Hypertension Mother   . Hypertension Other   . Colon cancer Neg Hx     ROS: Review of Systems  Constitutional: Positive for weight loss.  Gastrointestinal: Negative for nausea and vomiting.  Musculoskeletal:       Negative muscle weakness    PHYSICAL EXAM: Blood pressure 125/69, pulse 84, temperature 98.3 F (36.8 C), temperature source Oral, height 5\' 11"  (1.803 m), weight 239 lb (108.4 kg), SpO2 97 %. Body mass index is 33.33 kg/m. Physical Exam Vitals signs reviewed.  Constitutional:      Appearance: Normal appearance. He is obese.  Cardiovascular:     Rate and Rhythm: Normal rate.     Pulses: Normal pulses.  Pulmonary:     Effort: Pulmonary effort is normal.     Breath sounds: Normal breath sounds.  Musculoskeletal: Normal range of motion.  Skin:    General: Skin is warm and dry.  Neurological:     Mental Status: He is alert and oriented to  person, place, and time.  Psychiatric:        Mood and Affect: Mood normal.        Behavior: Behavior normal.     RECENT LABS AND TESTS: BMET    Component Value Date/Time   NA 140 05/22/2019 0941   K 4.6 05/22/2019 0941   CL 103 05/22/2019 0941   CO2 21 05/22/2019 0941   GLUCOSE 100 (H) 05/22/2019 0941   GLUCOSE 109 (H) 03/29/2018 0753   BUN 18 05/22/2019 0941  CREATININE 0.89 05/22/2019 0941   CALCIUM 9.2 05/22/2019 0941   GFRNONAA 89 05/22/2019 0941   GFRAA 103 05/22/2019 0941   Lab Results  Component Value Date   HGBA1C 5.8 (H) 05/22/2019   HGBA1C 5.9 (H) 12/16/2018   HGBA1C 6.0 10/05/2017   HGBA1C 6.0 03/29/2017   HGBA1C 6.0 03/10/2016   Lab Results  Component Value Date   INSULIN 23.0 05/22/2019   INSULIN 45.8 (H) 12/16/2018   CBC    Component Value Date/Time   WBC 7.8 12/16/2018 1100   WBC 6.2 03/29/2018 0753   RBC 4.20 12/16/2018 1100   RBC 4.29 03/29/2018 0753   HGB 15.1 12/16/2018 1100   HCT 43.6 12/16/2018 1100   PLT 164.0 03/29/2018 0753   MCV 104 (H) 12/16/2018 1100   MCH 36.0 (H) 12/16/2018 1100   MCH 33.9 09/09/2014 0510   MCHC 34.6 12/16/2018 1100   MCHC 34.2 03/29/2018 0753   RDW 11.4 (L) 12/16/2018 1100   LYMPHSABS 1.2 12/16/2018 1100   MONOABS 0.8 03/29/2018 0753   EOSABS 0.2 12/16/2018 1100   BASOSABS 0.0 12/16/2018 1100   Iron/TIBC/Ferritin/ %Sat No results found for: IRON, TIBC, FERRITIN, IRONPCTSAT Lipid Panel     Component Value Date/Time   CHOL 193 05/22/2019 0941   TRIG 149 05/22/2019 0941   HDL 54 05/22/2019 0941   CHOLHDL 4 03/29/2018 0753   VLDL 22.2 03/29/2018 0753   LDLCALC 109 (H) 05/22/2019 0941   LDLDIRECT 146.2 10/07/2010 0814   Hepatic Function Panel     Component Value Date/Time   PROT 7.1 05/22/2019 0941   ALBUMIN 4.3 05/22/2019 0941   AST 74 (H) 05/22/2019 0941   ALT 61 (H) 05/22/2019 0941   ALKPHOS 73 05/22/2019 0941   BILITOT 0.5 05/22/2019 0941   BILIDIR 0.2 03/29/2018 0753      Component Value  Date/Time   TSH 3.320 05/22/2019 0941   TSH 5.350 (H) 12/16/2018 1100   TSH 2.98 03/29/2018 0753      OBESITY BEHAVIORAL INTERVENTION VISIT  Today's visit was # 13   Starting weight: 259 lbs Starting date: 12/16/2018 Today's weight : 239 lbs Today's date: 06/30/2019 Total lbs lost to date: 20 At least 15 minutes were spent on discussing the following behavioral intervention visit.   ASK: We discussed the diagnosis of obesity with [name] today and Sevin agreed to give [place] permission to discuss obesity behavioral modification therapy today.  ASSESS: Greer has the diagnosis of obesity and his BMI today is 33.35 Davian is in the action stage of change   ADVISE: Grantley was educated on the multiple health risks of obesity as well as the benefit of weight loss to improve his health. He was advised of the need for long term treatment and the importance of lifestyle modifications to improve his current health and to decrease his risk of future health problems.  AGREE: Multiple dietary modification options and treatment options were discussed and  Ananth agreed to follow the recommendations documented in the above note.  ARRANGE: Brayen was educated on the importance of frequent visits to treat obesity as outlined per CMS and USPSTF guidelines and agreed to schedule his next follow up appointment today.  I, Trixie Dredge, am acting as transcriptionist for [name], MD  I have reviewed the above documentation for accuracy and completeness, and I agree with the above. -[name], MD

## 2019-07-12 ENCOUNTER — Other Ambulatory Visit: Payer: Self-pay

## 2019-07-12 ENCOUNTER — Ambulatory Visit (INDEPENDENT_AMBULATORY_CARE_PROVIDER_SITE_OTHER): Payer: Medicare HMO

## 2019-07-12 DIAGNOSIS — Z23 Encounter for immunization: Secondary | ICD-10-CM

## 2019-07-17 ENCOUNTER — Other Ambulatory Visit: Payer: Self-pay

## 2019-07-17 ENCOUNTER — Ambulatory Visit (INDEPENDENT_AMBULATORY_CARE_PROVIDER_SITE_OTHER): Payer: Medicare HMO | Admitting: Family Medicine

## 2019-07-17 ENCOUNTER — Encounter (INDEPENDENT_AMBULATORY_CARE_PROVIDER_SITE_OTHER): Payer: Self-pay | Admitting: Family Medicine

## 2019-07-17 VITALS — BP 136/80 | HR 75 | Temp 97.8°F | Ht 71.0 in | Wt 242.0 lb

## 2019-07-17 DIAGNOSIS — R7303 Prediabetes: Secondary | ICD-10-CM | POA: Diagnosis not present

## 2019-07-17 DIAGNOSIS — Z6833 Body mass index (BMI) 33.0-33.9, adult: Secondary | ICD-10-CM

## 2019-07-17 DIAGNOSIS — E669 Obesity, unspecified: Secondary | ICD-10-CM

## 2019-07-21 NOTE — Progress Notes (Signed)
Office: [phone]  /  Fax: [phone]   HPI:   Chief Complaint: OBESITY Zachary Valdez is here to discuss his progress with his obesity treatment plan. He is on the Category 2 plan or follow the Category 3 plan and is following his eating plan approximately 50 % of the time. He states he is walking for 20-30 minutes 2 times per week. Zachary Valdez has been deviating from his plan more and has started to regain weight. He is bored with his plan and would like to look at other options.  His weight is 242 lb (109.8 kg) today and has gained 3 lbs since his last visit. He has lost 17 lbs since starting treatment with [place].  Pre-Diabetes Zachary Valdez has a diagnosis of pre-diabetes based on his elevated Hgb A1c and was informed this puts him at greater risk of developing diabetes. He is taking metformin currently and has gotten a little off track with his eating this month. He is ready to get back on track. He continues to work on diet and exercise to decrease risk of diabetes. He denies nausea, vomiting, or hypoglycemia.  ASSESSMENT AND PLAN:  Prediabetes  Class 1 obesity with serious comorbidity and body mass index (BMI) of 33.0 to 33.9 in adult, unspecified obesity type  PLAN:  Pre-Diabetes Zachary Valdez will continue to work on weight loss, diet, exercise, and decreasing simple carbohydrates in his diet to help decrease the risk of diabetes. We dicussed metformin including benefits and risks. He was informed that eating too many simple carbohydrates or too many calories at one sitting increases the likelihood of GI side effects. Tolbert agrees to continue taking metformin, and we will recheck labs in 1 month. Lori agrees to follow up with our clinic in 2 to 3 weeks as directed to monitor his progress.  I spent > than 50% of the 15 minute visit on counseling as documented in the note.  Obesity [name] is currently in the action stage of change. As such, his goal is to continue with weight loss efforts He has agreed to  follow a lower carbohydrate, vegetable and lean protein rich diet plan or follow the Category 3 plan Zachary Valdez has been instructed to work up to a goal of 150 minutes of combined cardio and strengthening exercise per week for weight loss and overall health benefits. We discussed the following Behavioral Modification Strategies today: increasing lean protein intake, decreasing simple carbohydrates, and increase H20 intake    Zachary Valdez has agreed to follow up with our clinic in 2 to 3 weeks. He was informed of the importance of frequent follow up visits to maximize his success with intensive lifestyle modifications for his multiple health conditions.  ALLERGIES: Allergies  Allergen Reactions   Atacand [Candesartan Cilexetil]    Atorvastatin     REACTION: achy   Enalapril     cough   Hydrocodone-Acetaminophen     REACTION: SOB, patient states drug made patient feel weird    Losartan    Tramadol     headache   Wellbutrin [Bupropion]     MEDICATIONS: Current Outpatient Medications on File Prior to Visit  Medication Sig Dispense Refill   ALPRAZolam (XANAX) 0.5 MG tablet Take 1 tablet (0.5 mg total) by mouth 3 (three) times daily as needed for anxiety. 60 tablet 2   aspirin EC 81 MG tablet Take 1 tablet (81 mg total) by mouth daily. 100 tablet 3   B COMPLEX-C-E PO Take 1 tablet by mouth daily.     diclofenac (  VOLTAREN) 50 MG EC tablet Take 1 tablet (50 mg total) by mouth 2 (two) times daily as needed. 180 tablet 2   metFORMIN (GLUCOPHAGE) 500 MG tablet Take 1 tablet (500 mg total) by mouth daily with breakfast. 30 tablet 0   olmesartan (BENICAR) 40 MG tablet TAKE ONE TABLET BY MOUTH DAILY 90 tablet 3   pantoprazole (PROTONIX) 40 MG tablet Take 1 tablet (40 mg total) by mouth as needed. 90 tablet 0   rosuvastatin (CRESTOR) 40 MG tablet TAKE ONE TABLET BY MOUTH DAILY 90 tablet 2   vitamin B-12 (CYANOCOBALAMIN) 1000 MCG tablet Take 1 tablet (1,000 mcg total) by mouth daily. 100 tablet  3   Vitamin D, Ergocalciferol, (DRISDOL) 1.25 MG (50000 UT) CAPS capsule Take 1 capsule (50,000 Units total) by mouth every 14 (fourteen) days. 2 capsule 0   tadalafil (CIALIS) 20 MG tablet Take 1 tablet (20 mg total) by mouth daily as needed for erectile dysfunction. 30 tablet 3   No current facility-administered medications on file prior to visit.     PAST MEDICAL HISTORY: Past Medical History:  Diagnosis Date   Allergic rhinitis    Anxiety    GERD (gastroesophageal reflux disease)    Hyperlipidemia    Hyperplastic colon polyp 2006   Hypertension    OA (osteoarthritis)    Obesity    Sleep apnea    cpap-    Ulcerative esophagitis 2006   Dr. Fuller Plan   Vitamin B 12 deficiency     PAST SURGICAL HISTORY: Past Surgical History:  Procedure Laterality Date   KNEE ARTHROSCOPY     Right   TOTAL KNEE ARTHROPLASTY Left 09/07/2014   Procedure: LEFT TOTAL KNEE ARTHROPLASTY;  Surgeon: Gearlean Alf, MD;  Location: [facility];  Service: Orthopedics;  Laterality: Left;    SOCIAL HISTORY: Social History   Tobacco Use   Smoking status: Former Smoker    Types: Cigarettes    Quit date: 10/02/1997    Years since quitting: 21.8   Smokeless tobacco: Never Used  Substance Use Topics   Alcohol use: Yes    Alcohol/week: 14.0 standard drinks    Types: 14 Glasses of wine per week   Drug use: No    FAMILY HISTORY: Family History  Problem Relation Age of Onset   Breast cancer Mother    Hypertension Mother    Hypertension Other    Colon cancer Neg Hx     ROS: Review of Systems  Constitutional: Negative for weight loss.  Gastrointestinal: Negative for nausea and vomiting.  Endo/Heme/Allergies:       Negative hypoglycemia    PHYSICAL EXAM: Blood pressure 136/80, pulse 75, temperature 97.8 F (36.6 C), temperature source Oral, height 5\' 11"  (1.803 m), weight 242 lb (109.8 kg), SpO2 96 %. Body mass index is 33.75 kg/m. Physical Exam Vitals signs reviewed.    Constitutional:      Appearance: Normal appearance. He is obese.  Cardiovascular:     Rate and Rhythm: Normal rate.     Pulses: Normal pulses.  Pulmonary:     Effort: Pulmonary effort is normal.     Breath sounds: Normal breath sounds.  Musculoskeletal: Normal range of motion.  Skin:    General: Skin is warm and dry.  Neurological:     Mental Status: He is alert and oriented to person, place, and time.  Psychiatric:        Mood and Affect: Mood normal.        Behavior: Behavior normal.  RECENT LABS AND TESTS: BMET    Component Value Date/Time   NA 140 05/22/2019 0941   K 4.6 05/22/2019 0941   CL 103 05/22/2019 0941   CO2 21 05/22/2019 0941   GLUCOSE 100 (H) 05/22/2019 0941   GLUCOSE 109 (H) 03/29/2018 0753   BUN 18 05/22/2019 0941   CREATININE 0.89 05/22/2019 0941   CALCIUM 9.2 05/22/2019 0941   GFRNONAA 89 05/22/2019 0941   GFRAA 103 05/22/2019 0941   Lab Results  Component Value Date   HGBA1C 5.8 (H) 05/22/2019   HGBA1C 5.9 (H) 12/16/2018   HGBA1C 6.0 10/05/2017   HGBA1C 6.0 03/29/2017   HGBA1C 6.0 03/10/2016   Lab Results  Component Value Date   INSULIN 23.0 05/22/2019   INSULIN 45.8 (H) 12/16/2018   CBC    Component Value Date/Time   WBC 7.8 12/16/2018 1100   WBC 6.2 03/29/2018 0753   RBC 4.20 12/16/2018 1100   RBC 4.29 03/29/2018 0753   HGB 15.1 12/16/2018 1100   HCT 43.6 12/16/2018 1100   PLT 164.0 03/29/2018 0753   MCV 104 (H) 12/16/2018 1100   MCH 36.0 (H) 12/16/2018 1100   MCH 33.9 09/09/2014 0510   MCHC 34.6 12/16/2018 1100   MCHC 34.2 03/29/2018 0753   RDW 11.4 (L) 12/16/2018 1100   LYMPHSABS 1.2 12/16/2018 1100   MONOABS 0.8 03/29/2018 0753   EOSABS 0.2 12/16/2018 1100   BASOSABS 0.0 12/16/2018 1100   Iron/TIBC/Ferritin/ %Sat No results found for: IRON, TIBC, FERRITIN, IRONPCTSAT Lipid Panel     Component Value Date/Time   CHOL 193 05/22/2019 0941   TRIG 149 05/22/2019 0941   HDL 54 05/22/2019 0941   CHOLHDL 4 03/29/2018  0753   VLDL 22.2 03/29/2018 0753   LDLCALC 109 (H) 05/22/2019 0941   LDLDIRECT 146.2 10/07/2010 0814   Hepatic Function Panel     Component Value Date/Time   PROT 7.1 05/22/2019 0941   ALBUMIN 4.3 05/22/2019 0941   AST 74 (H) 05/22/2019 0941   ALT 61 (H) 05/22/2019 0941   ALKPHOS 73 05/22/2019 0941   BILITOT 0.5 05/22/2019 0941   BILIDIR 0.2 03/29/2018 0753      Component Value Date/Time   TSH 3.320 05/22/2019 0941   TSH 5.350 (H) 12/16/2018 1100   TSH 2.98 03/29/2018 0753      OBESITY BEHAVIORAL INTERVENTION VISIT  Today's visit was # 14   Starting weight: 259 lbs Starting date: 12/16/2018 Today's weight : 242 lbs Today's date: 07/17/2019 Total lbs lost to date: 39    ASK: We discussed the diagnosis of obesity with [name] today and [place] agreed to give [place] permission to discuss obesity behavioral modification therapy today.  ASSESS: Kyel has the diagnosis of obesity and his BMI today is 33.77 [place] is in the action stage of change   ADVISE: Ezrael was educated on the multiple health risks of obesity as well as the benefit of weight loss to improve his health. He was advised of the need for long term treatment and the importance of lifestyle modifications to improve his current health and to decrease his risk of future health problems.  AGREE: Multiple dietary modification options and treatment options were discussed and  Regie agreed to follow the recommendations documented in the above note.  ARRANGE: Santos was educated on the importance of frequent visits to treat obesity as outlined per CMS and USPSTF guidelines and agreed to schedule his next follow up appointment today.  Wilhemena Durie, am acting  as transcriptionist for [name], MD I have reviewed the above documentation for accuracy and completeness, and I agree with the above. -[name], MD

## 2019-08-05 ENCOUNTER — Encounter: Payer: Self-pay | Admitting: Pulmonary Disease

## 2019-08-05 ENCOUNTER — Other Ambulatory Visit: Payer: Self-pay

## 2019-08-05 ENCOUNTER — Ambulatory Visit: Payer: Medicare HMO | Admitting: Pulmonary Disease

## 2019-08-05 DIAGNOSIS — Z9989 Dependence on other enabling machines and devices: Secondary | ICD-10-CM | POA: Diagnosis not present

## 2019-08-05 DIAGNOSIS — G4733 Obstructive sleep apnea (adult) (pediatric): Secondary | ICD-10-CM

## 2019-08-05 NOTE — Progress Notes (Signed)
Subjective:    Patient ID: Zachary Valdez, male    DOB: 01/06/1951, 68 y.o.   MRN: [id number]  HPI  Chief Complaint  Patient presents with   Consult    Consult for sleep apnea. He has had a cpap for 22 years. He needs a new machine. He needs a new sleep study.     68 year old man presents to establish care for obstructive sleep apnea. He requests a new CPAP machine.  OSA was diagnosed 22 years ago study done at [facility] which is not available to me at the time of dictation.  He is now on his second machine which is more than 66 years old and at times the button does not work and he requests a new machine.  He will have settled down with nasal pillows and he does not know CPAP settings and no download from his current machine is available. [facility] but this has since changed to [facility].  When the prescription was sent by his PCP, he was told that his sleep study on file was really old and would need to be updated.  Epworth sleepiness score is 4 and he reports compliance with his CPAP every night. He had gained probably 50 pounds since his original sleep study but has lost 25 pounds in the last 2 years after going to the wellness clinic.  He is noted to be prediabetic on Metformin.  Bedtime is around 10 PM, sleep latency is minimal, he sleeps on his side with 1 pillow, reports 2 nocturnal awakenings including bathroom visits and is out of bed by 7 AM feeling rested with occasional dryness of mouth.  He does not use his humidifier often. He does have a soclean machine which he likes.  He drinks 3 to 4 cups of coffee every morning  There is no history suggestive of cataplexy, sleep paralysis or parasomnias      Past Medical History:  Diagnosis Date   Allergic rhinitis    Anxiety    GERD (gastroesophageal reflux disease)    Hyperlipidemia    Hyperplastic colon polyp 2006   Hypertension    OA (osteoarthritis)    Obesity    Sleep apnea    cpap-      Ulcerative esophagitis 2006   Dr. Fuller Plan   Vitamin B 12 deficiency     Past Surgical History:  Procedure Laterality Date   KNEE ARTHROSCOPY     Right   TOTAL KNEE ARTHROPLASTY Left 09/07/2014   Procedure: LEFT TOTAL KNEE ARTHROPLASTY;  Surgeon: Gearlean Alf, MD;  Location: [facility];  Service: Orthopedics;  Laterality: Left;    Allergies  Allergen Reactions   Atacand [Candesartan Cilexetil]    Atorvastatin     REACTION: achy   Enalapril     cough   Hydrocodone-Acetaminophen     REACTION: SOB, patient states drug made patient feel weird    Losartan    Tramadol     headache   Wellbutrin [Bupropion]     Social History   Socioeconomic History   Marital status: Divorced    Spouse name: Not on file   Number of children: Not on file   Years of education: Not on file   Highest education level: Not on file  Occupational History   Occupation: retired  Scientist, product/process development strain: Not hard at all   Food insecurity    Worry: Never true    Inability: Never true  Transportation needs    Medical: No    Non-medical: No  Tobacco Use   Smoking status: Former Smoker    Types: Cigarettes    Quit date: 10/02/1997    Years since quitting: 21.8   Smokeless tobacco: Never Used  Substance and Sexual Activity   Alcohol use: Yes    Alcohol/week: 14.0 standard drinks    Types: 14 Glasses of wine per week   Drug use: No   Sexual activity: Not Currently  Lifestyle   Physical activity    Days per week: 4 days    Minutes per session: 30 min   Stress: Only a little  Relationships   Social connections    Talks on phone: More than three times a week    Gets together: More than three times a week    Attends religious service: Not on file    Active member of club or organization: Yes    Attends meetings of clubs or organizations: 1 to 4 times per year    Relationship status: Divorced   Intimate partner violence    Fear of current or ex  partner: Not on file    Emotionally abused: Not on file    Physically abused: Not on file    Forced sexual activity: Not on file  Other Topics Concern   Not on file  Social History Narrative   Daily caffeine- 2-3 per day       Family History  Problem Relation Age of Onset   Breast cancer Mother    Hypertension Mother    Hypertension Other    Colon cancer Neg Hx       Review of Systems Constitutional: negative for anorexia, fevers and sweats  Eyes: negative for irritation, redness and visual disturbance  Ears, nose, mouth, throat, and face: negative for earaches, epistaxis, nasal congestion and sore throat  Respiratory: negative for cough, dyspnea on exertion, sputum and wheezing  Cardiovascular: negative for chest pain, dyspnea, lower extremity edema, orthopnea, palpitations and syncope  Gastrointestinal: negative for abdominal pain, constipation, diarrhea, melena, nausea and vomiting  Genitourinary:negative for dysuria, frequency and hematuria  Hematologic/lymphatic: negative for bleeding, easy bruising and lymphadenopathy  Musculoskeletal:negative for arthralgias, muscle weakness and stiff joints  Neurological: negative for coordination problems, gait problems, headaches and weakness  Endocrine: negative for diabetic symptoms including polydipsia, polyuria and weight loss     Objective:   Physical Exam  Gen. Pleasant, obese, in no distress, normal affect ENT - no pallor,icterus, no post nasal drip, class 2-3 airway Neck: No JVD, no thyromegaly, no carotid bruits Lungs: no use of accessory muscles, no dullness to percussion, decreased without rales or rhonchi  Cardiovascular: Rhythm regular, heart sounds  normal, no murmurs or gallops, no peripheral edema Abdomen: soft and non-tender, no hepatosplenomegaly, BS normal. Musculoskeletal: No deformities, no cyanosis or clubbing Neuro:  alert, non focal, no tremors       Assessment & Plan:

## 2019-08-05 NOTE — Assessment & Plan Note (Signed)
In spite of his weight fluctuation, he does seem to have OSA and will require CPAP.  We will clarify the severity with home sleep test Based on this we will set him with an auto CPAP and tweak settings as needed based on download. He is settled with nasal pillows and we can continue this, DME will be [facility]   The pathophysiology of obstructive sleep apnea , it's cardiovascular consequences & modes of treatment including CPAP were discused with the patient in detail & they evidenced understanding.  Weight loss encouraged, compliance with goal of at least 4-6 hrs every night is the expectation. Advised against medications with sedative side effects Cautioned against driving when sleepy - understanding that sleepiness will vary on a day to day basis

## 2019-08-05 NOTE — Patient Instructions (Signed)
Schedule home sleep test  Based on this, we will set you up with an auto CPAP machine with [facility]

## 2019-08-07 ENCOUNTER — Ambulatory Visit (INDEPENDENT_AMBULATORY_CARE_PROVIDER_SITE_OTHER): Payer: Medicare HMO | Admitting: Family Medicine

## 2019-08-07 ENCOUNTER — Encounter (INDEPENDENT_AMBULATORY_CARE_PROVIDER_SITE_OTHER): Payer: Self-pay | Admitting: Family Medicine

## 2019-08-07 ENCOUNTER — Other Ambulatory Visit: Payer: Self-pay

## 2019-08-07 VITALS — BP 130/74 | HR 79 | Temp 98.2°F | Ht 71.0 in | Wt 241.0 lb

## 2019-08-07 DIAGNOSIS — Z6833 Body mass index (BMI) 33.0-33.9, adult: Secondary | ICD-10-CM | POA: Diagnosis not present

## 2019-08-07 DIAGNOSIS — E559 Vitamin D deficiency, unspecified: Secondary | ICD-10-CM | POA: Diagnosis not present

## 2019-08-07 DIAGNOSIS — R7303 Prediabetes: Secondary | ICD-10-CM | POA: Diagnosis not present

## 2019-08-07 DIAGNOSIS — E669 Obesity, unspecified: Secondary | ICD-10-CM | POA: Diagnosis not present

## 2019-08-07 DIAGNOSIS — E7849 Other hyperlipidemia: Secondary | ICD-10-CM

## 2019-08-07 DIAGNOSIS — R7989 Other specified abnormal findings of blood chemistry: Secondary | ICD-10-CM

## 2019-08-07 MED ORDER — VITAMIN D (ERGOCALCIFEROL) 1.25 MG (50000 UNIT) PO CAPS: 50000.0000 [IU] | ORAL_CAPSULE | ORAL | 0 refills | Status: DC

## 2019-08-07 MED ORDER — METFORMIN HCL 500 MG PO TABS: 500.0000 mg | ORAL_TABLET | Freq: Every day | ORAL | 0 refills | Status: DC

## 2019-08-11 NOTE — Progress Notes (Signed)
Office: [phone]  /  Fax: [phone]   HPI:   Chief Complaint: OBESITY Zachary Valdez is here to discuss his progress with his obesity treatment plan. He is on the Category 3 plan and is following his eating plan approximately 50% of the time. He states he is exercising 0 minutes 0 times per week. Zachary Valdez has been traveling and eating out more, but has been mindful of his food choices. He states he will be doing more traveling this month. His weight is 241 lb (109.3 kg) today and has had a weight loss of 1 pound over a period of 3 weeks since his last visit. He has lost 18 lbs since starting treatment with [place].  Pre-Diabetes Zachary Valdez has a diagnosis of prediabetes based on his elevated Hgb A1c and was informed this puts him at greater risk of developing diabetes. He is taking metformin currently and doing well with his diet. No nausea or vomiting with metformin. He is due to have labs soon.   Vitamin D deficiency Zachary Valdez has a diagnosis of Vitamin D deficiency, and his levels are at goal. He is currently stable on prescription Vit D and denies nausea, vomiting or muscle weakness. He is due to have labs soon.  Hyperlipidemia Zachary Valdez has hyperlipidemia and has been trying to improve his cholesterol levels with intensive lifestyle modification including a low saturated fat diet, exercise and weight loss. He denies any chest pain, claudication or myalgias.  Elevated LFT's Zachary Valdez has a diagnosis of elevated LFT's and is working on weight loss.  He denies abdominal pain or jaundice.  ASSESSMENT AND PLAN:  Vitamin D deficiency - Plan: Vitamin D (25 hydroxy), Vitamin D, Ergocalciferol, (DRISDOL) 1.25 MG (50000 UT) CAPS capsule  Prediabetes - Plan: Comprehensive Metabolic Panel (CMET), HgB A1c, Insulin, random, metFORMIN (GLUCOPHAGE) 500 MG tablet  Other hyperlipidemia - Plan: Lipid Panel With LDL/HDL Ratio  Elevated LFTs - Plan: Lipid Panel With LDL/HDL Ratio  Class 1 obesity with serious comorbidity  and body mass index (BMI) of 33.0 to 33.9 in adult, unspecified obesity type  PLAN:  Pre-Diabetes Zachary Valdez will continue to work on weight loss, exercise, and decreasing simple carbohydrates in his diet to help decrease the risk of diabetes. We dicussed metformin including benefits and risks. He was informed that eating too many simple carbohydrates or too many calories at one sitting increases the likelihood of GI side effects. Zachary Valdez was given a refill on his metformin 500 mg #30 with 0 refills and agrees to follow-up with our clinic in 4 weeks. He will have labs checked.  Vitamin D Deficiency Zachary Valdez was informed that low Vitamin D levels contributes to fatigue and are associated with obesity, breast, and colon cancer. He agrees to continue to take prescription Vit D @ 50,000 IU every 14 days #2 with 0 refills and will follow-up for routine testing of Vitamin D. He was informed of the risk of over-replacement of Vitamin D and agrees to not increase his dose unless he discusses this with [place] first. Zachary Valdez agrees to follow-up with our clinic in 2 weeks.  Hyperlipidemia Zachary Valdez was informed of the American Heart Association Guidelines emphasizing intensive lifestyle modifications as the first line treatment for hyperlipidemia. We discussed many lifestyle modifications today in depth, and Zachary Valdez will continue to work on decreasing saturated fats such as fatty red meat, butter and many fried foods. Zachary Valdez will continue his diet plan and have labs checked. He will also increase vegetables and lean protein in his diet and continue to  work on exercise and weight loss efforts.  Elevated LFT's We discussed the likely diagnosis of non alcoholic fatty liver disease today and how this condition is obesity related. Zachary Valdez was educated on his risk of developing NASH or even liver failure and th only proven treatment for NAFLD was weight loss. Roe agreed to continue with his weight loss efforts with healthier diet and  exercise as an essential part of his treatment plan and will have labs checked.  Obesity Zachary Valdez is currently in the action stage of change. As such, his goal is to continue with weight loss efforts. He has agreed to follow the Category 3 plan. Zachary Valdez has been instructed to work up to a goal of 150 minutes of combined cardio and strengthening exercise per week for weight loss and overall health benefits. We discussed the following Behavioral Modification Strategies today: travel eating strategies and holiday eating strategies.  Zachary Valdez has agreed to follow-up with our clinic in 4 weeks. He was informed of the importance of frequent follow-up visits to maximize his success with intensive lifestyle modifications for his multiple health conditions.  ALLERGIES: Allergies  Allergen Reactions  . Atacand [Candesartan Cilexetil]   . Atorvastatin     REACTION: achy  . Enalapril     cough  . Hydrocodone-Acetaminophen     REACTION: SOB, patient states drug made patient feel weird   . Losartan   . Tramadol     headache  . Wellbutrin [Bupropion]     MEDICATIONS: Current Outpatient Medications on File Prior to Visit  Medication Sig Dispense Refill  . ALPRAZolam (XANAX) 0.5 MG tablet Take 1 tablet (0.5 mg total) by mouth 3 (three) times daily as needed for anxiety. 60 tablet 2  . aspirin EC 81 MG tablet Take 1 tablet (81 mg total) by mouth daily. 100 tablet 3  . B COMPLEX-C-E PO Take 1 tablet by mouth daily.    . diclofenac (VOLTAREN) 50 MG EC tablet Take 1 tablet (50 mg total) by mouth 2 (two) times daily as needed. 180 tablet 2  . olmesartan (BENICAR) 40 MG tablet TAKE ONE TABLET BY MOUTH DAILY 90 tablet 3  . pantoprazole (PROTONIX) 40 MG tablet Take 1 tablet (40 mg total) by mouth as needed. 90 tablet 0  . rosuvastatin (CRESTOR) 40 MG tablet TAKE ONE TABLET BY MOUTH DAILY 90 tablet 2  . vitamin B-12 (CYANOCOBALAMIN) 1000 MCG tablet Take 1 tablet (1,000 mcg total) by mouth daily. 100 tablet 3  .  tadalafil (CIALIS) 20 MG tablet Take 1 tablet (20 mg total) by mouth daily as needed for erectile dysfunction. 30 tablet 3   No current facility-administered medications on file prior to visit.     PAST MEDICAL HISTORY: Past Medical History:  Diagnosis Date  . Allergic rhinitis   . Anxiety   . GERD (gastroesophageal reflux disease)   . Hyperlipidemia   . Hyperplastic colon polyp 2006  . Hypertension   . OA (osteoarthritis)   . Obesity   . Sleep apnea    cpap-   . Ulcerative esophagitis 2006   Dr. Fuller Plan  . Vitamin B 12 deficiency     PAST SURGICAL HISTORY: Past Surgical History:  Procedure Laterality Date  . KNEE ARTHROSCOPY     Right  . TOTAL KNEE ARTHROPLASTY Left 09/07/2014   Procedure: LEFT TOTAL KNEE ARTHROPLASTY;  Surgeon: Gearlean Alf, MD;  Location: [facility];  Service: Orthopedics;  Laterality: Left;    SOCIAL HISTORY: Social History   Tobacco Use  .  Smoking status: Former Smoker    Types: Cigarettes    Quit date: 10/02/1997    Years since quitting: 21.8  . Smokeless tobacco: Never Used  Substance Use Topics  . Alcohol use: Yes    Alcohol/week: 14.0 standard drinks    Types: 14 Glasses of wine per week  . Drug use: No    FAMILY HISTORY: Family History  Problem Relation Age of Onset  . Breast cancer Mother   . Hypertension Mother   . Hypertension Other   . Colon cancer Neg Hx    ROS: Review of Systems  Cardiovascular: Negative for chest pain and claudication.  Gastrointestinal: Negative for abdominal pain, nausea and vomiting.  Musculoskeletal: Negative for myalgias.       Negative for muscle weakness.  Skin:       Negative for jaundice.   PHYSICAL EXAM: Blood pressure 130/74, pulse 79, temperature 98.2 F (36.8 C), temperature source Oral, height 5\' 11"  (1.803 m), weight 241 lb (109.3 kg), SpO2 96 %. Body mass index is 33.61 kg/m. Physical Exam Vitals signs reviewed.  Constitutional:      Appearance: Normal appearance. He is obese.   Cardiovascular:     Rate and Rhythm: Normal rate.     Pulses: Normal pulses.  Pulmonary:     Effort: Pulmonary effort is normal.     Breath sounds: Normal breath sounds.  Musculoskeletal: Normal range of motion.  Skin:    General: Skin is warm and dry.  Neurological:     Mental Status: He is alert and oriented to person, place, and time.  Psychiatric:        Behavior: Behavior normal.   RECENT LABS AND TESTS: BMET    Component Value Date/Time   NA 140 05/22/2019 0941   K 4.6 05/22/2019 0941   CL 103 05/22/2019 0941   CO2 21 05/22/2019 0941   GLUCOSE 100 (H) 05/22/2019 0941   GLUCOSE 109 (H) 03/29/2018 0753   BUN 18 05/22/2019 0941   CREATININE 0.89 05/22/2019 0941   CALCIUM 9.2 05/22/2019 0941   GFRNONAA 89 05/22/2019 0941   GFRAA 103 05/22/2019 0941   Lab Results  Component Value Date   HGBA1C 5.8 (H) 05/22/2019   HGBA1C 5.9 (H) 12/16/2018   HGBA1C 6.0 10/05/2017   HGBA1C 6.0 03/29/2017   HGBA1C 6.0 03/10/2016   Lab Results  Component Value Date   INSULIN 23.0 05/22/2019   INSULIN 45.8 (H) 12/16/2018   CBC    Component Value Date/Time   WBC 7.8 12/16/2018 1100   WBC 6.2 03/29/2018 0753   RBC 4.20 12/16/2018 1100   RBC 4.29 03/29/2018 0753   HGB 15.1 12/16/2018 1100   HCT 43.6 12/16/2018 1100   PLT 164.0 03/29/2018 0753   MCV 104 (H) 12/16/2018 1100   MCH 36.0 (H) 12/16/2018 1100   MCH 33.9 09/09/2014 0510   MCHC 34.6 12/16/2018 1100   MCHC 34.2 03/29/2018 0753   RDW 11.4 (L) 12/16/2018 1100   LYMPHSABS 1.2 12/16/2018 1100   MONOABS 0.8 03/29/2018 0753   EOSABS 0.2 12/16/2018 1100   BASOSABS 0.0 12/16/2018 1100   Iron/TIBC/Ferritin/ %Sat No results found for: IRON, TIBC, FERRITIN, IRONPCTSAT Lipid Panel     Component Value Date/Time   CHOL 193 05/22/2019 0941   TRIG 149 05/22/2019 0941   HDL 54 05/22/2019 0941   CHOLHDL 4 03/29/2018 0753   VLDL 22.2 03/29/2018 0753   LDLCALC 109 (H) 05/22/2019 0941   LDLDIRECT 146.2 10/07/2010 [id number]  Hepatic Function Panel     Component Value Date/Time   PROT 7.1 05/22/2019 0941   ALBUMIN 4.3 05/22/2019 0941   AST 74 (H) 05/22/2019 0941   ALT 61 (H) 05/22/2019 0941   ALKPHOS 73 05/22/2019 0941   BILITOT 0.5 05/22/2019 0941   BILIDIR 0.2 03/29/2018 0753      Component Value Date/Time   TSH 3.320 05/22/2019 0941   TSH 5.350 (H) 12/16/2018 1100   TSH 2.98 03/29/2018 0753   Results for Zachary, Valdez (MRN [id number]) as of 08/11/2019 09:54  Ref. Range 05/22/2019 09:41  Vitamin D, 25-Hydroxy Latest Ref Range: 30.0 - 100.0 ng/mL 59.9   OBESITY BEHAVIORAL INTERVENTION VISIT  Today's visit was #15  Starting weight: 259 lbs Starting date: 12/16/2018 Today's weight: 241 lbs  Today's date: 08/07/2019 Total lbs lost to date: 18 At least 15 minutes were spent on discussing the following behavioral intervention visit.    08/07/2019  Height 5\' 11"  (1.803 m)  Weight 241 lb (109.3 kg)  BMI (Calculated) 33.63  BLOOD PRESSURE - SYSTOLIC [id number]  BLOOD PRESSURE - DIASTOLIC 74   Body Fat % 123XX123 %  Total Body Water (lbs) 110 lbs   ASK: We discussed the diagnosis of obesity with [name] today and Inaki agreed to give [place] permission to discuss obesity behavioral modification therapy today.  ASSESS: Trina has the diagnosis of obesity and his BMI today is 33.7. Tereso is in the action stage of change.   ADVISE: Lazaro was educated on the multiple health risks of obesity as well as the benefit of weight loss to improve his health. He was advised of the need for long term treatment and the importance of lifestyle modifications to improve his current health and to decrease his risk of future health problems.  AGREE: Multiple dietary modification options and treatment options were discussed and  Emeka agreed to follow the recommendations documented in the above note.  ARRANGE: Seith was educated on the importance of frequent visits to treat obesity as outlined per CMS and USPSTF guidelines  and agreed to schedule his next follow up appointment today.  I, Michaelene [name], am acting as Location manager for [name], MD   I have reviewed the above documentation for accuracy and completeness, and I agree with the above. -[name], MD

## 2019-08-21 ENCOUNTER — Telehealth: Payer: Self-pay | Admitting: Pulmonary Disease

## 2019-08-21 NOTE — Telephone Encounter (Signed)
Pt aware of appt 09/02/19/cb

## 2019-09-02 ENCOUNTER — Other Ambulatory Visit: Payer: Self-pay

## 2019-09-02 ENCOUNTER — Ambulatory Visit: Payer: Medicare HMO

## 2019-09-02 DIAGNOSIS — G4733 Obstructive sleep apnea (adult) (pediatric): Secondary | ICD-10-CM

## 2019-09-02 DIAGNOSIS — Z9989 Dependence on other enabling machines and devices: Secondary | ICD-10-CM

## 2019-09-04 ENCOUNTER — Encounter (INDEPENDENT_AMBULATORY_CARE_PROVIDER_SITE_OTHER): Payer: Self-pay | Admitting: Family Medicine

## 2019-09-04 ENCOUNTER — Other Ambulatory Visit: Payer: Self-pay

## 2019-09-04 ENCOUNTER — Ambulatory Visit (INDEPENDENT_AMBULATORY_CARE_PROVIDER_SITE_OTHER): Payer: Medicare HMO | Admitting: Family Medicine

## 2019-09-04 VITALS — BP 141/80 | HR 66 | Temp 98.0°F | Ht 71.0 in | Wt 238.0 lb

## 2019-09-04 DIAGNOSIS — I10 Essential (primary) hypertension: Secondary | ICD-10-CM

## 2019-09-04 DIAGNOSIS — E559 Vitamin D deficiency, unspecified: Secondary | ICD-10-CM

## 2019-09-04 DIAGNOSIS — Z6833 Body mass index (BMI) 33.0-33.9, adult: Secondary | ICD-10-CM | POA: Diagnosis not present

## 2019-09-04 DIAGNOSIS — E669 Obesity, unspecified: Secondary | ICD-10-CM

## 2019-09-04 MED ORDER — VITAMIN D (ERGOCALCIFEROL) 1.25 MG (50000 UNIT) PO CAPS: 50000.0000 [IU] | ORAL_CAPSULE | ORAL | 0 refills | Status: DC

## 2019-09-07 NOTE — Progress Notes (Signed)
Office: [phone]  /  Fax: [phone]   HPI:   Chief Complaint: OBESITY Zachary Valdez is here to discuss his progress with his obesity treatment plan. He is on the Category 3 plan and is following his eating plan approximately 60-70 % of the time. He states he is exercising 0 minutes 0 times per week. Kelsey continues to do well with his eating plan and has lost another 3 lbs. His hunger is controlled and did well over Thanksgiving with portion control. He would like to lose another 20 lbs.  His weight is 238 lb (108 kg) today and has had a weight loss of 3 pounds over a period of 4 weeks since his last visit. He has lost 21 lbs since starting treatment with [place].  Vitamin D Deficiency Zachary Valdez has a diagnosis of vitamin D deficiency. He is stable on prescription Vit D and denies nausea, vomiting or muscle weakness.  Hypertension Zachary Valdez is a 68 y.o. male with hypertension. Shakim's blood pressure is slightly elevated today, normally well controlled on medications. He denies chest pain. He is working on weight loss to help control his blood pressure with the goal of decreasing his risk of heart attack and stroke.   ASSESSMENT AND PLAN:  Vitamin D deficiency - Plan: Vitamin D, Ergocalciferol, (DRISDOL) 1.25 MG (50000 UT) CAPS capsule  Essential hypertension  Class 1 obesity with serious comorbidity and body mass index (BMI) of 33.0 to 33.9 in adult, unspecified obesity type  PLAN:  Vitamin D Deficiency Low vitamin D level contributes to fatigue and are associated with obesity, breast, and colon cancer. Terre agrees to continue taking prescription Vit D 50,000 IU every 14 #2 and we will refill for 1 month. He will follow up for routine testing of vitamin D, at least 2-3 times per year. He was informed of the risk of over-replacement of vitamin D. We will recheck labs at his next visit. Zachary Valdez agrees to follow up with our clinic in 2 to 3 weeks.  Hypertension Dub is working on healthy  weight loss and exercise to improve blood pressure control. Auden agreed with this plan and agreed to follow up as directed. We will continue to monitor his blood pressure as well as his progress with the above lifestyle modifications. Melvyn agrees to continue his medications and will watch for signs of hypotension as he continues his lifestyle modifications. We will recheck his blood pressure in 2 to 3 weeks.  Obesity Zachary Valdez is currently in the action stage of change. As such, his goal is to continue with weight loss efforts He has agreed to follow the Category 3 plan Zachary Valdez has been instructed to work up to a goal of 150 minutes of combined cardio and strengthening exercise per week for weight loss and overall health benefits. We discussed the following Behavioral Modification Strategies today: increasing lean protein intake, emotional eating strategies and decrease liquid calories   Zachary Valdez has agreed to follow up with our clinic in 2 to 3 weeks. He was informed of the importance of frequent follow up visits to maximize his success with intensive lifestyle modifications for his multiple health conditions.  ALLERGIES: Allergies  Allergen Reactions  . Atacand [Candesartan Cilexetil]   . Atorvastatin     REACTION: achy  . Enalapril     cough  . Hydrocodone-Acetaminophen     REACTION: SOB, patient states drug made patient feel weird   . Losartan   . Tramadol     headache  . Wellbutrin [  Bupropion]     MEDICATIONS: Current Outpatient Medications on File Prior to Visit  Medication Sig Dispense Refill  . ALPRAZolam (XANAX) 0.5 MG tablet Take 1 tablet (0.5 mg total) by mouth 3 (three) times daily as needed for anxiety. 60 tablet 2  . aspirin EC 81 MG tablet Take 1 tablet (81 mg total) by mouth daily. 100 tablet 3  . B COMPLEX-C-E PO Take 1 tablet by mouth daily.    . diclofenac (VOLTAREN) 50 MG EC tablet Take 1 tablet (50 mg total) by mouth 2 (two) times daily as needed. 180 tablet 2  .  metFORMIN (GLUCOPHAGE) 500 MG tablet Take 1 tablet (500 mg total) by mouth daily with breakfast. 30 tablet 0  . olmesartan (BENICAR) 40 MG tablet TAKE ONE TABLET BY MOUTH DAILY 90 tablet 3  . pantoprazole (PROTONIX) 40 MG tablet Take 1 tablet (40 mg total) by mouth as needed. 90 tablet 0  . rosuvastatin (CRESTOR) 40 MG tablet TAKE ONE TABLET BY MOUTH DAILY 90 tablet 2  . vitamin B-12 (CYANOCOBALAMIN) 1000 MCG tablet Take 1 tablet (1,000 mcg total) by mouth daily. 100 tablet 3  . tadalafil (CIALIS) 20 MG tablet Take 1 tablet (20 mg total) by mouth daily as needed for erectile dysfunction. 30 tablet 3   No current facility-administered medications on file prior to visit.     PAST MEDICAL HISTORY: Past Medical History:  Diagnosis Date  . Allergic rhinitis   . Anxiety   . GERD (gastroesophageal reflux disease)   . Hyperlipidemia   . Hyperplastic colon polyp 2006  . Hypertension   . OA (osteoarthritis)   . Obesity   . Sleep apnea    cpap-   . Ulcerative esophagitis 2006   Dr. Fuller Plan  . Vitamin B 12 deficiency     PAST SURGICAL HISTORY: Past Surgical History:  Procedure Laterality Date  . KNEE ARTHROSCOPY     Right  . TOTAL KNEE ARTHROPLASTY Left 09/07/2014   Procedure: LEFT TOTAL KNEE ARTHROPLASTY;  Surgeon: Gearlean Alf, MD;  Location: [facility];  Service: Orthopedics;  Laterality: Left;    SOCIAL HISTORY: Social History   Tobacco Use  . Smoking status: Former Smoker    Types: Cigarettes    Quit date: 10/02/1997    Years since quitting: 21.9  . Smokeless tobacco: Never Used  Substance Use Topics  . Alcohol use: Yes    Alcohol/week: 14.0 standard drinks    Types: 14 Glasses of wine per week  . Drug use: No    FAMILY HISTORY: Family History  Problem Relation Age of Onset  . Breast cancer Mother   . Hypertension Mother   . Hypertension Other   . Colon cancer Neg Hx     ROS: Review of Systems  Constitutional: Positive for weight loss.  Cardiovascular: Negative for  chest pain.  Gastrointestinal: Negative for nausea and vomiting.  Musculoskeletal:       Negative muscle weakness    PHYSICAL EXAM: Blood pressure (!) 141/80, pulse 66, temperature 98 F (36.7 C), temperature source Oral, height 5\' 11"  (1.803 m), weight 238 lb (108 kg), SpO2 95 %. Body mass index is 33.19 kg/m. Physical Exam Vitals signs reviewed.  Constitutional:      Appearance: Normal appearance. He is obese.  Cardiovascular:     Rate and Rhythm: Normal rate.     Pulses: Normal pulses.  Pulmonary:     Effort: Pulmonary effort is normal.     Breath sounds: Normal breath sounds.  Musculoskeletal: Normal range of motion.  Skin:    General: Skin is warm and dry.  Neurological:     Mental Status: He is alert and oriented to person, place, and time.  Psychiatric:        Mood and Affect: Mood normal.        Behavior: Behavior normal.     RECENT LABS AND TESTS: BMET    Component Value Date/Time   NA 140 05/22/2019 0941   K 4.6 05/22/2019 0941   CL 103 05/22/2019 0941   CO2 21 05/22/2019 0941   GLUCOSE 100 (H) 05/22/2019 0941   GLUCOSE 109 (H) 03/29/2018 0753   BUN 18 05/22/2019 0941   CREATININE 0.89 05/22/2019 0941   CALCIUM 9.2 05/22/2019 0941   GFRNONAA 89 05/22/2019 0941   GFRAA 103 05/22/2019 0941   Lab Results  Component Value Date   HGBA1C 5.8 (H) 05/22/2019   HGBA1C 5.9 (H) 12/16/2018   HGBA1C 6.0 10/05/2017   HGBA1C 6.0 03/29/2017   HGBA1C 6.0 03/10/2016   Lab Results  Component Value Date   INSULIN 23.0 05/22/2019   INSULIN 45.8 (H) 12/16/2018   CBC    Component Value Date/Time   WBC 7.8 12/16/2018 1100   WBC 6.2 03/29/2018 0753   RBC 4.20 12/16/2018 1100   RBC 4.29 03/29/2018 0753   HGB 15.1 12/16/2018 1100   HCT 43.6 12/16/2018 1100   PLT 164.0 03/29/2018 0753   MCV 104 (H) 12/16/2018 1100   MCH 36.0 (H) 12/16/2018 1100   MCH 33.9 09/09/2014 0510   MCHC 34.6 12/16/2018 1100   MCHC 34.2 03/29/2018 0753   RDW 11.4 (L) 12/16/2018 1100    LYMPHSABS 1.2 12/16/2018 1100   MONOABS 0.8 03/29/2018 0753   EOSABS 0.2 12/16/2018 1100   BASOSABS 0.0 12/16/2018 1100   Iron/TIBC/Ferritin/ %Sat No results found for: IRON, TIBC, FERRITIN, IRONPCTSAT Lipid Panel     Component Value Date/Time   CHOL 193 05/22/2019 0941   TRIG 149 05/22/2019 0941   HDL 54 05/22/2019 0941   CHOLHDL 4 03/29/2018 0753   VLDL 22.2 03/29/2018 0753   LDLCALC 109 (H) 05/22/2019 0941   LDLDIRECT 146.2 10/07/2010 0814   Hepatic Function Panel     Component Value Date/Time   PROT 7.1 05/22/2019 0941   ALBUMIN 4.3 05/22/2019 0941   AST 74 (H) 05/22/2019 0941   ALT 61 (H) 05/22/2019 0941   ALKPHOS 73 05/22/2019 0941   BILITOT 0.5 05/22/2019 0941   BILIDIR 0.2 03/29/2018 0753      Component Value Date/Time   TSH 3.320 05/22/2019 0941   TSH 5.350 (H) 12/16/2018 1100   TSH 2.98 03/29/2018 0753      OBESITY BEHAVIORAL INTERVENTION VISIT  Today's visit was # 16   Starting weight: 259 lbs Starting date: 12/16/2018 Today's weight : 238 lbs Today's date: 09/04/2019 Total lbs lost to date: 21 At least 15 minutes were spent on discussing the following behavioral intervention visit.   ASK: We discussed the diagnosis of obesity with [name] today and Gardner agreed to give [place] permission to discuss obesity behavioral modification therapy today.  ASSESS: Elwin has the diagnosis of obesity and his BMI today is 33.21 Zahir is in the action stage of change   ADVISE: Truman was educated on the multiple health risks of obesity as well as the benefit of weight loss to improve his health. He was advised of the need for long term treatment and the importance of lifestyle modifications to  improve his current health and to decrease his risk of future health problems.  AGREE: Multiple dietary modification options and treatment options were discussed and  Pacer agreed to follow the recommendations documented in the above note.  ARRANGE: Gianpaolo was educated  on the importance of frequent visits to treat obesity as outlined per CMS and USPSTF guidelines and agreed to schedule his next follow up appointment today.  I, Trixie Dredge, am acting as transcriptionist for [name], MD  I have reviewed the above documentation for accuracy and completeness, and I agree with the above. -[name], MD

## 2019-09-09 DIAGNOSIS — G4733 Obstructive sleep apnea (adult) (pediatric): Secondary | ICD-10-CM

## 2019-09-10 ENCOUNTER — Telehealth: Payer: Self-pay | Admitting: General Surgery

## 2019-09-10 ENCOUNTER — Other Ambulatory Visit: Payer: Self-pay | Admitting: General Surgery

## 2019-09-10 DIAGNOSIS — Z9989 Dependence on other enabling machines and devices: Secondary | ICD-10-CM

## 2019-09-10 DIAGNOSIS — G4733 Obstructive sleep apnea (adult) (pediatric): Secondary | ICD-10-CM

## 2019-09-10 NOTE — Telephone Encounter (Signed)
Called the patient to make him aware of the results per Dr. Elsworth Soho (Severe OSA 60/H. Auto CPAP 10 - 20 cm mask of choice back in 6 weeks)  The patient stated the current machine he has is at least 77 - 66 years old. And he would rather pay out of pocket if the cost from the DME billing the insurance will be higher.  He stated he ordered it last time and paid out of pocket for it. I told him the order was already placed, but when the DME contacts him, to ask what the out of pocket costs are if he paid directly and what would be the cost if billed to insurance. Patient voiced understanding and I asked him to call our office once he decides what works best for him as the follow up would need to be set up based on that.

## 2019-09-13 ENCOUNTER — Other Ambulatory Visit (INDEPENDENT_AMBULATORY_CARE_PROVIDER_SITE_OTHER): Payer: Self-pay | Admitting: Family Medicine

## 2019-09-13 DIAGNOSIS — R7303 Prediabetes: Secondary | ICD-10-CM

## 2019-09-18 ENCOUNTER — Other Ambulatory Visit: Payer: Medicare HMO

## 2019-09-18 DIAGNOSIS — R739 Hyperglycemia, unspecified: Secondary | ICD-10-CM

## 2019-09-23 ENCOUNTER — Ambulatory Visit (INDEPENDENT_AMBULATORY_CARE_PROVIDER_SITE_OTHER): Payer: Medicare HMO | Admitting: Family Medicine

## 2019-09-23 ENCOUNTER — Other Ambulatory Visit: Payer: Self-pay

## 2019-09-23 ENCOUNTER — Encounter (INDEPENDENT_AMBULATORY_CARE_PROVIDER_SITE_OTHER): Payer: Self-pay | Admitting: Family Medicine

## 2019-09-23 VITALS — BP 133/78 | HR 67 | Temp 98.0°F | Ht 71.0 in | Wt 236.0 lb

## 2019-09-23 DIAGNOSIS — R7303 Prediabetes: Secondary | ICD-10-CM | POA: Diagnosis not present

## 2019-09-23 DIAGNOSIS — E559 Vitamin D deficiency, unspecified: Secondary | ICD-10-CM

## 2019-09-23 DIAGNOSIS — E669 Obesity, unspecified: Secondary | ICD-10-CM | POA: Diagnosis not present

## 2019-09-23 DIAGNOSIS — E7849 Other hyperlipidemia: Secondary | ICD-10-CM | POA: Diagnosis not present

## 2019-09-23 DIAGNOSIS — Z6833 Body mass index (BMI) 33.0-33.9, adult: Secondary | ICD-10-CM | POA: Diagnosis not present

## 2019-09-23 DIAGNOSIS — R7989 Other specified abnormal findings of blood chemistry: Secondary | ICD-10-CM | POA: Diagnosis not present

## 2019-09-23 MED ORDER — VITAMIN D (ERGOCALCIFEROL) 1.25 MG (50000 UNIT) PO CAPS: 50000.0000 [IU] | ORAL_CAPSULE | ORAL | 0 refills | Status: DC

## 2019-09-23 MED ORDER — METFORMIN HCL 500 MG PO TABS: 500.0000 mg | ORAL_TABLET | Freq: Every day | ORAL | 0 refills | Status: DC

## 2019-09-24 LAB — LIPID PANEL WITH LDL/HDL RATIO
Cholesterol, Total: 206 mg/dL — ABNORMAL HIGH (ref 100–199)
HDL: 61 mg/dL (ref >39)
LDL Chol Calc (NIH): 119 mg/dL — ABNORMAL HIGH (ref 0–99)
LDL/HDL Ratio: 2 ratio (ref 0.0–3.6)
Triglycerides: 149 mg/dL (ref 0–149)
VLDL Cholesterol Cal: 26 mg/dL (ref 5–40)

## 2019-09-24 LAB — COMPREHENSIVE METABOLIC PANEL
ALT: 73 IU/L — ABNORMAL HIGH (ref 0–44)
AST: 56 IU/L — ABNORMAL HIGH (ref 0–40)
Albumin/Globulin Ratio: 1.6 (ref 1.2–2.2)
Albumin: 4.6 g/dL (ref 3.8–4.8)
Alkaline Phosphatase: 74 IU/L (ref 39–117)
BUN/Creatinine Ratio: 26 — ABNORMAL HIGH (ref 10–24)
BUN: 20 mg/dL (ref 8–27)
Bilirubin Total: 0.8 mg/dL (ref 0.0–1.2)
CO2: 21 mmol/L (ref 20–29)
Calcium: 10.1 mg/dL (ref 8.6–10.2)
Chloride: 100 mmol/L (ref 96–106)
Creatinine, Ser: 0.76 mg/dL (ref 0.76–1.27)
GFR calc Af Amer: 110 mL/min/{1.73_m2} (ref >59)
GFR calc non Af Amer: 95 mL/min/{1.73_m2} (ref >59)
Globulin, Total: 2.9 g/dL (ref 1.5–4.5)
Glucose: 103 mg/dL — ABNORMAL HIGH (ref 65–99)
Potassium: 4.9 mmol/L (ref 3.5–5.2)
Sodium: 138 mmol/L (ref 134–144)
Total Protein: 7.5 g/dL (ref 6.0–8.5)

## 2019-09-24 LAB — HEMOGLOBIN A1C
Est. average glucose Bld gHb Est-mCnc: 105 mg/dL
Hgb A1c MFr Bld: 5.3 % (ref 4.8–5.6)

## 2019-09-24 LAB — INSULIN, RANDOM: INSULIN: 21.8 u[IU]/mL (ref 2.6–24.9)

## 2019-09-24 LAB — VITAMIN D 25 HYDROXY (VIT D DEFICIENCY, FRACTURES): Vit D, 25-Hydroxy: 39.7 ng/mL (ref 30.0–100.0)

## 2019-09-29 DIAGNOSIS — G4733 Obstructive sleep apnea (adult) (pediatric): Secondary | ICD-10-CM | POA: Diagnosis not present

## 2019-09-29 NOTE — Progress Notes (Signed)
Office: [phone]  /  Fax: [phone]   HPI:  Chief Complaint: OBESITY Zachary Valdez is here to discuss his progress with his obesity treatment plan. He is on the Category 3 plan and states he is following his eating plan approximately 60 % of the time. He states he is walking for 20-30 minutes 2-3 times per week.  Zachary Valdez continues to do well with weight loss even in December. He is planning on doing some celebration eating over the holidays, but he has a strategy to portion control. He doesn't always eat all of the protein at dinner, but he often picks higher protein meals.  Today's visit was # 37  Starting weight: 259 lbs Starting date: 12/16/2018 Today's weight : 236 lbs Today's date: 09/23/2019 Total lbs lost to date: 23 Total lbs lost since last in-office visit: 2  Pre-Diabetes Zachary Valdez has a diagnosis of pre-diabetes. He is stable on metformin, and he had labs done earlier today. He denies nausea, vomiting, or hypoglycemia.  Vitamin D Deficiency Zachary Valdez has a diagnosis of vitamin D deficiency. His last labs were at goal on Vit D prescription. He denies nausea, vomiting, or muscle weakness.  ASSESSMENT AND PLAN:  Prediabetes - Plan: metFORMIN (GLUCOPHAGE) 500 MG tablet  Vitamin D deficiency - Plan: Vitamin D, Ergocalciferol, (DRISDOL) 1.25 MG (50000 UT) CAPS capsule  Class 1 obesity with serious comorbidity and body mass index (BMI) of 33.0 to 33.9 in adult, unspecified obesity type  PLAN:  Pre-Diabetes Zachary Valdez will continue to work on weight loss, exercise, and decreasing simple carbohydrates to help decrease the risk of diabetes. Zachary Valdez agrees to continue taking metformin 500 mg PO q AM #30 and we will refill for 1 month. Zachary Valdez agrees to follow up with [place] as directed.  Vitamin D Deficiency Low Vitamin D level contributes to fatigue and are associated with obesity, breast, and colon cancer. Zachary Valdez agrees to continue to decrease prescription Vitamin D 50,000 IU to every 14 days #2  with no refills. He will follow up for routine testing of vitamin D, at least 2-3 times per year to avoid over-replacement. We will discuss labs when his results return. Zachary Valdez agrees to follow up with [place] as directed.   Obesity Zachary Valdez is currently in the action stage of change. As such, his goal is to continue with weight loss efforts. He has agreed to follow the Category 3 plan. Zachary Valdez has been instructed to work up to a goal of 150 minutes of combined cardio and strengthening exercise per week for weight loss and overall health benefits. We discussed the following Behavioral Modification Strategies today: increasing lean protein intake and holiday eating strategies .   Zachary Valdez has agreed to follow-up with our clinic in 4 weeks. He was informed of the importance of frequent follow-up visits to maximize his success with intensive lifestyle modifications for his multiple health conditions.  ALLERGIES: Allergies  Allergen Reactions  . Atacand [Candesartan Cilexetil]   . Atorvastatin     REACTION: achy  . Enalapril     cough  . Hydrocodone-Acetaminophen     REACTION: SOB, patient states drug made patient feel weird   . Losartan   . Tramadol     headache  . Wellbutrin [Bupropion]     MEDICATIONS: Current Outpatient Medications on File Prior to Visit  Medication Sig Dispense Refill  . ALPRAZolam (XANAX) 0.5 MG tablet Take 1 tablet (0.5 mg total) by mouth 3 (three) times daily as needed for anxiety. 60 tablet 2  . aspirin EC  81 MG tablet Take 1 tablet (81 mg total) by mouth daily. 100 tablet 3  . B COMPLEX-C-E PO Take 1 tablet by mouth daily.    . diclofenac (VOLTAREN) 50 MG EC tablet Take 1 tablet (50 mg total) by mouth 2 (two) times daily as needed. 180 tablet 2  . olmesartan (BENICAR) 40 MG tablet TAKE ONE TABLET BY MOUTH DAILY 90 tablet 3  . pantoprazole (PROTONIX) 40 MG tablet Take 1 tablet (40 mg total) by mouth as needed. 90 tablet 0  . rosuvastatin (CRESTOR) 40 MG tablet TAKE ONE  TABLET BY MOUTH DAILY 90 tablet 2  . vitamin B-12 (CYANOCOBALAMIN) 1000 MCG tablet Take 1 tablet (1,000 mcg total) by mouth daily. 100 tablet 3  . tadalafil (CIALIS) 20 MG tablet Take 1 tablet (20 mg total) by mouth daily as needed for erectile dysfunction. 30 tablet 3   No current facility-administered medications on file prior to visit.    PAST MEDICAL HISTORY: Past Medical History:  Diagnosis Date  . Allergic rhinitis   . Anxiety   . GERD (gastroesophageal reflux disease)   . Hyperlipidemia   . Hyperplastic colon polyp 2006  . Hypertension   . OA (osteoarthritis)   . Obesity   . Sleep apnea    cpap-   . Ulcerative esophagitis 2006   Dr. Fuller Plan  . Vitamin B 12 deficiency     PAST SURGICAL HISTORY: Past Surgical History:  Procedure Laterality Date  . KNEE ARTHROSCOPY     Right  . TOTAL KNEE ARTHROPLASTY Left 09/07/2014   Procedure: LEFT TOTAL KNEE ARTHROPLASTY;  Surgeon: Gearlean Alf, MD;  Location: [facility];  Service: Orthopedics;  Laterality: Left;    SOCIAL HISTORY: Social History   Tobacco Use  . Smoking status: Former Smoker    Types: Cigarettes    Quit date: 10/02/1997    Years since quitting: 22.0  . Smokeless tobacco: Never Used  Substance Use Topics  . Alcohol use: Yes    Alcohol/week: 14.0 standard drinks    Types: 14 Glasses of wine per week  . Drug use: No    FAMILY HISTORY: Family History  Problem Relation Age of Onset  . Breast cancer Mother   . Hypertension Mother   . Hypertension Other   . Colon cancer Neg Hx     ROS: Review of Systems  Constitutional: Positive for weight loss.  Gastrointestinal: Negative for nausea and vomiting.  Musculoskeletal:       Negative muscle weakness  Endo/Heme/Allergies:       Negative hypoglycemia    PHYSICAL EXAM: Blood pressure 133/78, pulse 67, temperature 98 F (36.7 C), temperature source Oral, height 5\' 11"  (1.803 m), weight 236 lb (107 kg), SpO2 96 %. Body mass index is 32.92 kg/m. Physical  Exam Vitals reviewed.  Constitutional:      Appearance: Normal appearance. He is obese.  Cardiovascular:     Rate and Rhythm: Normal rate.     Pulses: Normal pulses.  Pulmonary:     Effort: Pulmonary effort is normal.     Breath sounds: Normal breath sounds.  Musculoskeletal:        General: Normal range of motion.  Skin:    General: Skin is warm and dry.  Neurological:     Mental Status: He is alert and oriented to person, place, and time.  Psychiatric:        Mood and Affect: Mood normal.        Behavior: Behavior normal.  RECENT LABS AND TESTS: BMET    Component Value Date/Time   NA 138 09/23/2019 0948   K 4.9 09/23/2019 0948   CL 100 09/23/2019 0948   CO2 21 09/23/2019 0948   GLUCOSE 103 (H) 09/23/2019 0948   GLUCOSE 109 (H) 03/29/2018 0753   BUN 20 09/23/2019 0948   CREATININE 0.76 09/23/2019 0948   CALCIUM 10.1 09/23/2019 0948   GFRNONAA 95 09/23/2019 0948   GFRAA 110 09/23/2019 0948   Lab Results  Component Value Date   HGBA1C 5.3 09/23/2019   HGBA1C 5.8 (H) 05/22/2019   HGBA1C 5.9 (H) 12/16/2018   HGBA1C 6.0 10/05/2017   HGBA1C 6.0 03/29/2017   Lab Results  Component Value Date   INSULIN 21.8 09/23/2019   INSULIN 23.0 05/22/2019   INSULIN 45.8 (H) 12/16/2018   CBC    Component Value Date/Time   WBC 7.8 12/16/2018 1100   WBC 6.2 03/29/2018 0753   RBC 4.20 12/16/2018 1100   RBC 4.29 03/29/2018 0753   HGB 15.1 12/16/2018 1100   HCT 43.6 12/16/2018 1100   PLT 164.0 03/29/2018 0753   MCV 104 (H) 12/16/2018 1100   MCH 36.0 (H) 12/16/2018 1100   MCH 33.9 09/09/2014 0510   MCHC 34.6 12/16/2018 1100   MCHC 34.2 03/29/2018 0753   RDW 11.4 (L) 12/16/2018 1100   LYMPHSABS 1.2 12/16/2018 1100   MONOABS 0.8 03/29/2018 0753   EOSABS 0.2 12/16/2018 1100   BASOSABS 0.0 12/16/2018 1100   Iron/TIBC/Ferritin/ %Sat No results found for: IRON, TIBC, FERRITIN, IRONPCTSAT Lipid Panel     Component Value Date/Time   CHOL 206 (H) 09/23/2019 0948   TRIG  149 09/23/2019 0948   HDL 61 09/23/2019 0948   CHOLHDL 4 03/29/2018 0753   VLDL 22.2 03/29/2018 0753   LDLCALC 119 (H) 09/23/2019 0948   LDLDIRECT 146.2 10/07/2010 0814   Hepatic Function Panel     Component Value Date/Time   PROT 7.5 09/23/2019 0948   ALBUMIN 4.6 09/23/2019 0948   AST 56 (H) 09/23/2019 0948   ALT 73 (H) 09/23/2019 0948   ALKPHOS 74 09/23/2019 0948   BILITOT 0.8 09/23/2019 0948   BILIDIR 0.2 03/29/2018 0753      Component Value Date/Time   TSH 3.320 05/22/2019 0941   TSH 5.350 (H) 12/16/2018 1100   TSH 2.98 03/29/2018 0753     OBESITY BEHAVIORAL INTERVENTION VISIT DOCUMENTATION FOR INSURANCE (~15 minutes)  ASK: We discussed the diagnosis of obesity with Zachary Valdez today and Zachary Valdez agreed to give [place] permission to discuss obesity behavioral modification therapy today.  ASSESS: Zachary Valdez has the diagnosis of obesity and his BMI today is 32.93 Zachary Valdez is in the action stage of change.   ADVISE: Zachary Valdez was educated on the multiple health risks of obesity as well as the benefit of weight loss to improve his health. He was advised of the need for long term treatment and the importance of lifestyle modifications to improve his current health and to decrease his risk of future health problems.  AGREE: Multiple dietary modification options and treatment options were discussed and  Zachary Valdez agreed to follow the recommendations documented in the above note.  ARRANGE: Zachary Valdez was educated on the importance of frequent visits to treat obesity as outlined per CMS and USPSTF guidelines and agreed to schedule his next follow up appointment today.   I, Trixie Dredge, am acting as transcriptionist for [name], MD.   I have reviewed the above documentation for accuracy and completeness, and I  agree with the above. -[name], MD

## 2019-10-09 ENCOUNTER — Telehealth: Payer: Self-pay

## 2019-10-09 NOTE — Telephone Encounter (Signed)
Copied from [place] [phone]. Topic: Referral - Request for Referral >> Oct 09, 2019  9:57 AM Percell Belt A wrote: Has patient seen PCP for this complaint? Yes.   *If NO, is insurance requiring patient see PCP for this issue before PCP can refer them? Referral for which specialty: Ortho  Preferred provider/office: [facility]  Reason for referral: Right Knee pain.  Pt has new ins and needs another referral

## 2019-10-10 NOTE — Telephone Encounter (Signed)
Visit with Dr. Gardenia Phlegm?

## 2019-10-16 ENCOUNTER — Other Ambulatory Visit: Payer: Self-pay

## 2019-10-16 ENCOUNTER — Encounter: Payer: Self-pay | Admitting: Family Medicine

## 2019-10-16 ENCOUNTER — Ambulatory Visit: Payer: Medicare HMO | Admitting: Family Medicine

## 2019-10-16 VITALS — BP 126/82 | HR 77 | Ht 71.0 in | Wt 246.0 lb

## 2019-10-16 DIAGNOSIS — M13861 Other specified arthritis, right knee: Secondary | ICD-10-CM

## 2019-10-16 DIAGNOSIS — M1711 Unilateral primary osteoarthritis, right knee: Secondary | ICD-10-CM | POA: Diagnosis not present

## 2019-10-16 MED ORDER — GABAPENTIN 100 MG PO CAPS: 200.0000 mg | ORAL_CAPSULE | Freq: Every day | ORAL | 0 refills | Status: DC

## 2019-10-16 NOTE — Progress Notes (Signed)
[place] Phone: [phone] Subjective:   [name], am serving as a scribe for Dr. Hulan Saas. This visit occurred during the SARS-CoV-2 public health emergency.  Safety protocols were in place, including screening questions prior to the visit, additional usage of staff PPE, and extensive cleaning of exam room while observing appropriate contact time as indicated for disinfecting solutions.    CC: Knee pain follow-up  [id number]   12/03/2018 Discussed the neurological ramifications and other possible triggers such as alcohol consumption.  Patient is going to try to change his diet and see if this would beneficial.  Discussed continuing the gabapentin and possibly increasing.  Follow-up again in 2 to 3 months  Encourage weight loss.  Patient will [facility].  Patient will follow-up with me again in 4 weeks  Update 10/16/2019 Zachary Valdez is a 69 y.o. male coming in with complaint of right knee pain. Called to see Dr. Wynelle Link but his insurance needed referral. Contacted Dr. Alain Marion for referral and he recommended for patient to see Dr. Tamala Julian. Patient is having a hard time sleeping due to pain and feels like he is not able to walk and be active. Has had cortisone injections from Dr. Wynelle Link which he feels did not work. Is unsure if he has ever tried visco supplementation. Has custom knee brace but does not wear it as it needs a new strap.  Patient is wondering what else can possibly be done at this time.    Past Medical History:  Diagnosis Date  . Allergic rhinitis   . Anxiety   . GERD (gastroesophageal reflux disease)   . Hyperlipidemia   . Hyperplastic colon polyp 2006  . Hypertension   . OA (osteoarthritis)   . Obesity   . Sleep apnea    cpap-   . Ulcerative esophagitis 2006   Dr. Fuller Plan  . Vitamin B 12 deficiency    Past Surgical History:  Procedure Laterality Date  .  KNEE ARTHROSCOPY     Right  . TOTAL KNEE ARTHROPLASTY Left 09/07/2014   Procedure: LEFT TOTAL KNEE ARTHROPLASTY;  Surgeon: Gearlean Alf, MD;  Location: [facility];  Service: Orthopedics;  Laterality: Left;   Social History   Socioeconomic History  . Marital status: Divorced    Spouse name: Not on file  . Number of children: Not on file  . Years of education: Not on file  . Highest education level: Not on file  Occupational History  . Occupation: retired  Tobacco Use  . Smoking status: Former Smoker    Types: Cigarettes    Quit date: 10/02/1997    Years since quitting: 22.0  . Smokeless tobacco: Never Used  Substance and Sexual Activity  . Alcohol use: Yes    Alcohol/week: 14.0 standard drinks    Types: 14 Glasses of wine per week  . Drug use: No  . Sexual activity: Not Currently  Other Topics Concern  . Not on file  Social History Narrative   Daily caffeine- 2-3 per day   Social Determinants of Health   Financial Resource Strain: Low Risk   . Difficulty of Paying Living Expenses: Not hard at all  Food Insecurity: No Food Insecurity  . Worried About Charity fundraiser in the Last Year: Never true  . Ran Out of Food in the Last Year: Never true  Transportation Needs: No Transportation Needs  . Lack of Transportation (  Medical): No  . Lack of Transportation (Non-Medical): No  Physical Activity: Insufficiently Active  . Days of Exercise per Week: 4 days  . Minutes of Exercise per Session: 30 min  Stress: No Stress Concern Present  . Feeling of Stress : Only a little  Social Connections: Unknown  . Frequency of Communication with Friends and Family: More than three times a week  . Frequency of Social Gatherings with Friends and Family: More than three times a week  . Attends Religious Services: Not on file  . Active Member of Clubs or Organizations: Yes  . Attends Archivist Meetings: 1 to 4 times per year  . Marital Status: Divorced   Allergies  Allergen  Reactions  . Atacand [Candesartan Cilexetil]   . Atorvastatin     REACTION: achy  . Enalapril     cough  . Hydrocodone-Acetaminophen     REACTION: SOB, patient states drug made patient feel weird   . Losartan   . Tramadol     headache  . Wellbutrin [Bupropion]    Family History  Problem Relation Age of Onset  . Breast cancer Mother   . Hypertension Mother   . Hypertension Other   . Colon cancer Neg Hx     Current Outpatient Medications (Endocrine & Metabolic):  .  metFORMIN (GLUCOPHAGE) 500 MG tablet, Take 1 tablet (500 mg total) by mouth daily with breakfast.  Current Outpatient Medications (Cardiovascular):  .  olmesartan (BENICAR) 40 MG tablet, TAKE ONE TABLET BY MOUTH DAILY .  rosuvastatin (CRESTOR) 40 MG tablet, TAKE ONE TABLET BY MOUTH DAILY .  tadalafil (CIALIS) 20 MG tablet, Take 1 tablet (20 mg total) by mouth daily as needed for erectile dysfunction.   Current Outpatient Medications (Analgesics):  .  aspirin EC 81 MG tablet, Take 1 tablet (81 mg total) by mouth daily. .  diclofenac (VOLTAREN) 50 MG EC tablet, Take 1 tablet (50 mg total) by mouth 2 (two) times daily as needed.  Current Outpatient Medications (Hematological):  .  vitamin B-12 (CYANOCOBALAMIN) 1000 MCG tablet, Take 1 tablet (1,000 mcg total) by mouth daily.  Current Outpatient Medications (Other):  Marland Kitchen  ALPRAZolam (XANAX) 0.5 MG tablet, Take 1 tablet (0.5 mg total) by mouth 3 (three) times daily as needed for anxiety. .  B COMPLEX-C-E PO, Take 1 tablet by mouth daily. Marland Kitchen  gabapentin (NEURONTIN) 100 MG capsule, Take 2 capsules (200 mg total) by mouth at bedtime. .  pantoprazole (PROTONIX) 40 MG tablet, Take 1 tablet (40 mg total) by mouth as needed. .  Vitamin D, Ergocalciferol, (DRISDOL) 1.25 MG (50000 UT) CAPS capsule, Take 1 capsule (50,000 Units total) by mouth every 14 (fourteen) days.    Past medical history, social, surgical and family history all reviewed in electronic medical record.  No  pertanent information unless stated regarding to the chief complaint.   Review of Systems:  No headache, visual changes, nausea, vomiting, diarrhea, constipation, dizziness, abdominal pain, skin rash, fevers, chills, night sweats, weight loss, swollen lymph nodes, body aches, joint swelling, muscle aches, chest pain, shortness of breath, mood changes.   Objective  Blood pressure 126/82, pulse 77, height 5\' 11"  (1.803 m), weight 246 lb (111.6 kg), SpO2 96 %.    General: No apparent distress alert and oriented x3 mood and affect normal, dressed appropriately.  HEENT: Pupils equal, extraocular movements intact  Respiratory: Patient's speak in full sentences and does not appear short of breath  Cardiovascular: No lower extremity edema, non tender, no  erythema  Skin: Warm dry intact with no signs of infection or rash on extremities or on axial skeleton.  Abdomen: Soft nontender  Neuro: Cranial nerves II through XII are intact, neurovascularly intact in all extremities with 2+ DTRs and 2+ pulses.  Lymph: No lymphadenopathy of posterior or anterior cervical chain or axillae bilaterally.  Gait antalgic  MSK: Knee: Right valgus deformity noted. Large thigh to calf ratio.  Tender to palpation over medial and PF joint line.  ROM full in flexion and extension and lower leg rotation. instability with valgus force.  painful patellar compression. Patellar glide with moderate crepitus. Patellar and quadriceps tendons unremarkable. Hamstring and quadriceps strength is normal. Contralateral knee shows knee replacement noted in good position with no instability    Impression and Recommendations:     This case required medical decision making of moderate complexity. The above documentation has been reviewed and is accurate and complete Lyndal Pulley, DO       Note: This dictation was prepared with Dragon dictation along with smaller phrase technology. Any transcriptional errors that result from  this process are unintentional.

## 2019-10-16 NOTE — Patient Instructions (Addendum)
Zachary Valdez [phone]      Get new orthotics Gabapentin [place]aid Talk to [place] - if you need me call me

## 2019-10-16 NOTE — Assessment & Plan Note (Signed)
Severe degenerative arthritis of the knee.  Discussed the possibility of viscosupplementation.  We will try to get approval for this.  Discussed that we could get the brace changed if he would like to.  Topical anti-inflammatories given, patient is already going to go see another provider in the near future to discuss the possibility of replacement.  Patient will consider all the other treatment options we discussed today in great detail.  Spent  25 minutes with patient face-to-face and had greater than 50% of counseling including as described above in assessment and plan.

## 2019-10-22 ENCOUNTER — Encounter (INDEPENDENT_AMBULATORY_CARE_PROVIDER_SITE_OTHER): Payer: Self-pay | Admitting: Family Medicine

## 2019-10-22 ENCOUNTER — Other Ambulatory Visit: Payer: Self-pay

## 2019-10-22 ENCOUNTER — Ambulatory Visit (INDEPENDENT_AMBULATORY_CARE_PROVIDER_SITE_OTHER): Payer: Medicare HMO | Admitting: Family Medicine

## 2019-10-22 VITALS — BP 146/78 | HR 87 | Temp 98.3°F | Ht 71.0 in | Wt 239.0 lb

## 2019-10-22 DIAGNOSIS — R7303 Prediabetes: Secondary | ICD-10-CM

## 2019-10-22 DIAGNOSIS — E559 Vitamin D deficiency, unspecified: Secondary | ICD-10-CM | POA: Diagnosis not present

## 2019-10-22 DIAGNOSIS — E669 Obesity, unspecified: Secondary | ICD-10-CM

## 2019-10-22 DIAGNOSIS — Z6833 Body mass index (BMI) 33.0-33.9, adult: Secondary | ICD-10-CM | POA: Diagnosis not present

## 2019-10-22 MED ORDER — VITAMIN D (ERGOCALCIFEROL) 1.25 MG (50000 UNIT) PO CAPS: 50000.0000 [IU] | ORAL_CAPSULE | ORAL | 0 refills | Status: DC

## 2019-10-22 MED ORDER — METFORMIN HCL 500 MG PO TABS: 500.0000 mg | ORAL_TABLET | Freq: Every day | ORAL | 0 refills | Status: DC

## 2019-10-23 NOTE — Progress Notes (Signed)
Chief Complaint:   OBESITY Rom is here to discuss his progress with his obesity treatment plan along with follow-up of his obesity related diagnoses. Kraven is on the Category 3 Plan and states he is following his eating plan approximately 60-70% of the time. Demir states he is walking for 20 minutes 4 times per week.  Today's visit was #: 100 Starting weight: 259 lbs Starting date: 12/16/2018 Today's weight: 239 lbs Today's date: 10/22/2019 Total lbs lost to date: 20 Total lbs lost since last in-office visit: 0  Interim History: Takashi did some celebration eating over the holidays, but he is struggling with being bored on his plan. He would like to look at other options.  Subjective:   1. Vitamin D deficiency Maximillian is stable on Vit D, but his level had decreased over the winter.  2. Pre-diabetes Jaques is stable on metformin. He is working on his diet. He requests a refill of metformin today.  Assessment/Plan:   1. Vitamin D deficiency Low Vitamin D level contributes to fatigue and are associated with obesity, breast, and colon cancer. We will refill prescription Vit D for 1 month, but change to 1 tablet PO every week. Zakaria will follow-up for routine testing of Vitamin D, at least 2-3 times per year to avoid over-replacement. We will continue to monitor.  - Vitamin D, Ergocalciferol, (DRISDOL) 1.25 MG (50000 UNIT) CAPS capsule; Take 1 capsule (50,000 Units total) by mouth every 14 (fourteen) days.  Dispense: 2 capsule; Refill: 0  2. Pre-diabetes Dorr will get back to diet, exercise, weight loss, and decreasing simple carbohydrates to help decrease the risk of diabetes. We will recheck labs in 1 month. We will refill metformin for 1 month, and will continue to monitor.   - metFORMIN (GLUCOPHAGE) 500 MG tablet; Take 1 tablet (500 mg total) by mouth daily with breakfast.  Dispense: 30 tablet; Refill: 0  3. Class 1 obesity with serious comorbidity and body mass index (BMI) of  33.0 to 33.9 in adult, unspecified obesity type Aaiden is currently in the action stage of change. As such, his goal is to continue with weight loss efforts. He has agreed to change to keeping a food journal and adhering to recommended goals of 1500-1800 calories and 100+ grams of protein daily.   Exercise goals: Bransyn is to continue his current exercise regimen as is.  Behavioral modification strategies: increasing lean protein intake and keeping a strict food journal.  Toren has agreed to follow-up with our clinic in 3 weeks. He was informed of the importance of frequent follow-up visits to maximize his success with intensive lifestyle modifications for his multiple health conditions.   Objective:   Blood pressure (!) 146/78, pulse 87, temperature 98.3 F (36.8 C), temperature source Oral, height 5\' 11"  (1.803 m), weight 239 lb (108.4 kg), SpO2 92 %. Body mass index is 33.33 kg/m.  General: Cooperative, alert, well developed, in no acute distress. HEENT: Conjunctivae and lids unremarkable. Cardiovascular: Regular rhythm.  Lungs: Normal work of breathing. Neurologic: No focal deficits.   Lab Results  Component Value Date   CREATININE 0.76 09/23/2019   BUN 20 09/23/2019   NA 138 09/23/2019   K 4.9 09/23/2019   CL 100 09/23/2019   CO2 21 09/23/2019   Lab Results  Component Value Date   ALT 73 (H) 09/23/2019   AST 56 (H) 09/23/2019   ALKPHOS 74 09/23/2019   BILITOT 0.8 09/23/2019   Lab Results  Component Value Date  HGBA1C 5.3 09/23/2019   HGBA1C 5.8 (H) 05/22/2019   HGBA1C 5.9 (H) 12/16/2018   HGBA1C 6.0 10/05/2017   HGBA1C 6.0 03/29/2017   Lab Results  Component Value Date   INSULIN 21.8 09/23/2019   INSULIN 23.0 05/22/2019   INSULIN 45.8 (H) 12/16/2018   Lab Results  Component Value Date   TSH 3.320 05/22/2019   Lab Results  Component Value Date   CHOL 206 (H) 09/23/2019   HDL 61 09/23/2019   LDLCALC 119 (H) 09/23/2019   LDLDIRECT 146.2 10/07/2010   TRIG  149 09/23/2019   CHOLHDL 4 03/29/2018   Lab Results  Component Value Date   WBC 7.8 12/16/2018   HGB 15.1 12/16/2018   HCT 43.6 12/16/2018   MCV 104 (H) 12/16/2018   PLT 164.0 03/29/2018   No results found for: IRON, TIBC, FERRITIN  Obesity Behavioral Intervention Documentation for Insurance:   Approximately 15 minutes were spent on the discussion below.  ASK: We discussed the diagnosis of obesity with Shantel today and Brayton agreed to give [place] permission to discuss obesity behavioral modification therapy today.  ASSESS: Antoinne has the diagnosis of obesity and his BMI today is 33.35. Kehinde is in the action stage of change.   ADVISE: Kisean was educated on the multiple health risks of obesity as well as the benefit of weight loss to improve his health. He was advised of the need for long term treatment and the importance of lifestyle modifications to improve his current health and to decrease his risk of future health problems.  AGREE: Multiple dietary modification options and treatment options were discussed and Riyansh agreed to follow the recommendations documented in the above note.  ARRANGE: Kolten was educated on the importance of frequent visits to treat obesity as outlined per CMS and USPSTF guidelines and agreed to schedule his next follow up appointment today.  Attestation Statements:   Reviewed by clinician on day of visit: allergies, medications, problem list, medical history, surgical history, family history, social history, and previous encounter notes.   I, Trixie Dredge, am acting as transcriptionist for [name], MD.  I have reviewed the above documentation for accuracy and completeness, and I agree with the above. -  [name], MD

## 2019-10-30 ENCOUNTER — Ambulatory Visit: Payer: Medicare HMO

## 2019-10-30 DIAGNOSIS — G4733 Obstructive sleep apnea (adult) (pediatric): Secondary | ICD-10-CM | POA: Diagnosis not present

## 2019-11-07 ENCOUNTER — Ambulatory Visit: Payer: Medicare HMO

## 2019-11-10 ENCOUNTER — Ambulatory Visit: Payer: Medicare HMO | Attending: Internal Medicine

## 2019-11-10 DIAGNOSIS — Z23 Encounter for immunization: Secondary | ICD-10-CM | POA: Insufficient documentation

## 2019-11-10 NOTE — Progress Notes (Signed)
   Covid-19 Vaccination Clinic  Name:  Zachary Valdez    MRN: [id number] DOB: September 07, 1951  11/10/2019  [name] was observed post Covid-19 immunization for 15 minutes without incidence. He was provided with Vaccine Information Sheet and instruction to access the V-Safe system.   [name] was instructed to call 911 with any severe reactions post vaccine: Marland Kitchen Difficulty breathing  . Swelling of your face and throat  . A fast heartbeat  . A bad rash all over your body  . Dizziness and weakness    Immunizations Administered    Name Date Dose VIS Date Route   Pfizer COVID-19 Vaccine 11/10/2019 12:58 AM 0.3 mL 09/12/2019 Intramuscular   Manufacturer: [place]   Lot: [id number]   [place]: [id number]

## 2019-11-12 ENCOUNTER — Encounter (INDEPENDENT_AMBULATORY_CARE_PROVIDER_SITE_OTHER): Payer: Self-pay | Admitting: Family Medicine

## 2019-11-12 ENCOUNTER — Ambulatory Visit (INDEPENDENT_AMBULATORY_CARE_PROVIDER_SITE_OTHER): Payer: Medicare HMO | Admitting: Family Medicine

## 2019-11-12 ENCOUNTER — Other Ambulatory Visit: Payer: Self-pay

## 2019-11-12 VITALS — BP 121/74 | HR 76 | Temp 98.3°F | Ht 71.0 in | Wt 234.0 lb

## 2019-11-12 DIAGNOSIS — Z6832 Body mass index (BMI) 32.0-32.9, adult: Secondary | ICD-10-CM

## 2019-11-12 DIAGNOSIS — E559 Vitamin D deficiency, unspecified: Secondary | ICD-10-CM | POA: Diagnosis not present

## 2019-11-12 DIAGNOSIS — E669 Obesity, unspecified: Secondary | ICD-10-CM | POA: Diagnosis not present

## 2019-11-12 DIAGNOSIS — E8881 Metabolic syndrome: Secondary | ICD-10-CM | POA: Diagnosis not present

## 2019-11-12 MED ORDER — METFORMIN HCL 500 MG PO TABS: 500.0000 mg | ORAL_TABLET | Freq: Every day | ORAL | 0 refills | Status: DC

## 2019-11-12 MED ORDER — VITAMIN D (ERGOCALCIFEROL) 1.25 MG (50000 UNIT) PO CAPS: 50000.0000 [IU] | ORAL_CAPSULE | ORAL | 0 refills | Status: DC

## 2019-11-13 NOTE — Progress Notes (Signed)
Chief Complaint:   OBESITY Zachary Valdez is here to discuss his progress with his obesity treatment plan along with follow-up of his obesity related diagnoses. Zachary Valdez is on keeping a food journal and adhering to recommended goals of 1500-1800 calories and 100+ grams of protein and states he is following his eating plan approximately 60% of the time. Zachary Valdez states he is walking for 20 minutes 4 times per week.  Today's visit was #: 60 Starting weight: 259 lbs Starting date: 12/16/2018 Today's weight: 234 lbs Today's date: 11/12/2019 Total lbs lost to date: 25 Total lbs lost since last in-office visit: 5  Interim History: Zachary Valdez continues to do well with weight loss on his Category 3 plan. His hunger is controlled and he has tried a couple of new recipes he likes.  Subjective:   1. Vitamin D deficiency Zachary Valdez is stable on Vit D, and denies nausea, vomiting, or muscle weakness. He requests a refill today.  2. Insulin resistance Zachary Valdez has done well with diet and he is tolerating metformin well. He requests a refill today.  Assessment/Plan:   1. Vitamin D deficiency Low Vitamin D level contributes to fatigue and are associated with obesity, breast, and colon cancer. We will refill prescription Vitamin D for 1 month. Zachary Valdez will follow-up for routine testing of Vitamin D, at least 2-3 times per year to avoid over-replacement.  - Vitamin D, Ergocalciferol, (DRISDOL) 1.25 MG (50000 UNIT) CAPS capsule; Take 1 capsule (50,000 Units total) by mouth every 7 (seven) days.  Dispense: 4 capsule; Refill: 0  2. Insulin resistance Zachary Valdez will continue to work on weight loss, exercise, and decreasing simple carbohydrates to help decrease the risk of diabetes. We will refill metformin for 1 month. Zachary Valdez agreed to follow-up with [place] as directed to closely monitor his progress.  - metFORMIN (GLUCOPHAGE) 500 MG tablet; Take 1 tablet (500 mg total) by mouth daily with breakfast.  Dispense: 30 tablet; Refill: 0  3.  Class 1 obesity with serious comorbidity and body mass index (BMI) of 32.0 to 32.9 in adult, unspecified obesity type Zachary Valdez is currently in the action stage of change. As such, his goal is to continue with weight loss efforts. He has agreed to the Category 3 Plan.   Exercise goals: Zachary Valdez is to continue his current exercise regimen as is.  Behavioral modification strategies: increasing lean protein intake.  Zachary Valdez has agreed to follow-up with our clinic in 3 weeks. He was informed of the importance of frequent follow-up visits to maximize his success with intensive lifestyle modifications for his multiple health conditions.   Objective:   Blood pressure 121/74, pulse 76, temperature 98.3 F (36.8 C), temperature source Oral, height 5\' 11"  (1.803 m), weight 234 lb (106.1 kg), SpO2 97 %. Body mass index is 32.64 kg/m.  General: Cooperative, alert, well developed, in no acute distress. HEENT: Conjunctivae and lids unremarkable. Cardiovascular: Regular rhythm.  Lungs: Normal work of breathing. Neurologic: No focal deficits.   Lab Results  Component Value Date   CREATININE 0.76 09/23/2019   BUN 20 09/23/2019   NA 138 09/23/2019   K 4.9 09/23/2019   CL 100 09/23/2019   CO2 21 09/23/2019   Lab Results  Component Value Date   ALT 73 (H) 09/23/2019   AST 56 (H) 09/23/2019   ALKPHOS 74 09/23/2019   BILITOT 0.8 09/23/2019   Lab Results  Component Value Date   HGBA1C 5.3 09/23/2019   HGBA1C 5.8 (H) 05/22/2019   HGBA1C 5.9 (H) 12/16/2018  HGBA1C 6.0 10/05/2017   HGBA1C 6.0 03/29/2017   Lab Results  Component Value Date   INSULIN 21.8 09/23/2019   INSULIN 23.0 05/22/2019   INSULIN 45.8 (H) 12/16/2018   Lab Results  Component Value Date   TSH 3.320 05/22/2019   Lab Results  Component Value Date   CHOL 206 (H) 09/23/2019   HDL 61 09/23/2019   LDLCALC 119 (H) 09/23/2019   LDLDIRECT 146.2 10/07/2010   TRIG 149 09/23/2019   CHOLHDL 4 03/29/2018   Lab Results  Component  Value Date   WBC 7.8 12/16/2018   HGB 15.1 12/16/2018   HCT 43.6 12/16/2018   MCV 104 (H) 12/16/2018   PLT 164.0 03/29/2018   No results found for: IRON, TIBC, FERRITIN  Obesity Behavioral Intervention Documentation for Insurance:   Approximately 15 minutes were spent on the discussion below.  ASK: We discussed the diagnosis of obesity with Zachary Valdez today and Zachary Valdez agreed to give [place] permission to discuss obesity behavioral modification therapy today.  ASSESS: Zachary Valdez has the diagnosis of obesity and his BMI today is 32.65. Zachary Valdez is in the action stage of change.   ADVISE: Zachary Valdez was educated on the multiple health risks of obesity as well as the benefit of weight loss to improve his health. He was advised of the need for long term treatment and the importance of lifestyle modifications to improve his current health and to decrease his risk of future health problems.  AGREE: Multiple dietary modification options and treatment options were discussed and Zachary Valdez agreed to follow the recommendations documented in the above note.  ARRANGE: Zachary Valdez was educated on the importance of frequent visits to treat obesity as outlined per CMS and USPSTF guidelines and agreed to schedule his next follow up appointment today.  Attestation Statements:   Reviewed by clinician on day of visit: allergies, medications, problem list, medical history, surgical history, family history, social history, and previous encounter notes.   I, Zachary Valdez, am acting as transcriptionist for [name], MD.  I have reviewed the above documentation for accuracy and completeness, and I agree with the above. -  [name], MD

## 2019-11-14 DIAGNOSIS — M25561 Pain in right knee: Secondary | ICD-10-CM | POA: Diagnosis not present

## 2019-11-20 ENCOUNTER — Ambulatory Visit: Payer: Medicare HMO

## 2019-11-30 DIAGNOSIS — G4733 Obstructive sleep apnea (adult) (pediatric): Secondary | ICD-10-CM | POA: Diagnosis not present

## 2019-12-03 ENCOUNTER — Other Ambulatory Visit: Payer: Self-pay

## 2019-12-03 ENCOUNTER — Ambulatory Visit (INDEPENDENT_AMBULATORY_CARE_PROVIDER_SITE_OTHER): Payer: Medicare HMO | Admitting: Family Medicine

## 2019-12-03 ENCOUNTER — Encounter (INDEPENDENT_AMBULATORY_CARE_PROVIDER_SITE_OTHER): Payer: Self-pay | Admitting: Family Medicine

## 2019-12-03 VITALS — BP 131/75 | HR 67 | Temp 98.6°F | Ht 71.0 in | Wt 234.0 lb

## 2019-12-03 DIAGNOSIS — Z6832 Body mass index (BMI) 32.0-32.9, adult: Secondary | ICD-10-CM | POA: Diagnosis not present

## 2019-12-03 DIAGNOSIS — E8881 Metabolic syndrome: Secondary | ICD-10-CM

## 2019-12-03 DIAGNOSIS — E669 Obesity, unspecified: Secondary | ICD-10-CM

## 2019-12-03 DIAGNOSIS — E559 Vitamin D deficiency, unspecified: Secondary | ICD-10-CM | POA: Diagnosis not present

## 2019-12-03 MED ORDER — VITAMIN D (ERGOCALCIFEROL) 1.25 MG (50000 UNIT) PO CAPS: 50000.0000 [IU] | ORAL_CAPSULE | ORAL | 0 refills | Status: DC

## 2019-12-03 MED ORDER — METFORMIN HCL 500 MG PO TABS: 500.0000 mg | ORAL_TABLET | Freq: Every day | ORAL | 0 refills | Status: DC

## 2019-12-04 ENCOUNTER — Ambulatory Visit: Payer: Medicare HMO | Attending: Internal Medicine

## 2019-12-04 DIAGNOSIS — Z23 Encounter for immunization: Secondary | ICD-10-CM | POA: Insufficient documentation

## 2019-12-04 NOTE — Progress Notes (Signed)
Chief Complaint:   OBESITY Courey is here to discuss his progress with his obesity treatment plan along with follow-up of his obesity related diagnoses. Evren is on the Category 3 Plan and states he is following his eating plan approximately 20% of the time. Montrez states he is doing 0 minutes 0 times per week.  Today's visit was #: 20 Starting weight: 259 lbs Starting date: 12/16/2018 Today's weight: 234 lbs Today's date: 12/03/2019 Total lbs lost to date: 25 Total lbs lost since last in-office visit: 0  Interim History: [place] continues to work on weight loss, and he feels he has lost weight and he has but he is retaining some water weight (approximately 2 lbs). He is happy with his plan overall and he is getting ready to start working an active job 2 days per week.  Subjective:   1. Vitamin D deficiency Daquavion is stable on Vit D and he denies nausea or vomiting.  2. Insulin resistance Deyon is working on diet and he is stable on metformin.  Assessment/Plan:   1. Vitamin D deficiency Low Vitamin D level contributes to fatigue and are associated with obesity, breast, and colon cancer. We will refill prescription Vitamin D for 1 month. Ankit will follow-up for routine testing of Vitamin D, at least 2-3 times per year to avoid over-replacement. We will recheck labs in 1 month.  - Vitamin D, Ergocalciferol, (DRISDOL) 1.25 MG (50000 UNIT) CAPS capsule; Take 1 capsule (50,000 Units total) by mouth every 7 (seven) days.  Dispense: 4 capsule; Refill: 0  2. Insulin resistance Loren will continue to work on weight loss, diet, exercise, and decreasing simple carbohydrates to help decrease the risk of diabetes. We will refill metformin for 1 month. We will recheck labs in 1 months. Raliegh agreed to follow-up with [place] as directed to closely monitor his progress.  - metFORMIN (GLUCOPHAGE) 500 MG tablet; Take 1 tablet (500 mg total) by mouth daily with breakfast.  Dispense: 30 tablet; Refill:  0  3. Class 1 obesity with serious comorbidity and body mass index (BMI) of 32.0 to 32.9 in adult, unspecified obesity type Mozell is currently in the action stage of change. As such, his goal is to continue with weight loss efforts. He has agreed to the Category 3 Plan.   Exercise goals: Raekwon is to continue his current exercise regimen as is.  Behavioral modification strategies: decreasing simple carbohydrates.  Donyae has agreed to follow-up with our clinic in 4 weeks. He was informed of the importance of frequent follow-up visits to maximize his success with intensive lifestyle modifications for his multiple health conditions.   Objective:   Blood pressure 131/75, pulse 67, temperature 98.6 F (37 C), temperature source Oral, height 5\' 11"  (1.803 m), weight 234 lb (106.1 kg), SpO2 96 %. Body mass index is 32.64 kg/m.  General: Cooperative, alert, well developed, in no acute distress. HEENT: Conjunctivae and lids unremarkable. Cardiovascular: Regular rhythm.  Lungs: Normal work of breathing. Neurologic: No focal deficits.   Lab Results  Component Value Date   CREATININE 0.76 09/23/2019   BUN 20 09/23/2019   NA 138 09/23/2019   K 4.9 09/23/2019   CL 100 09/23/2019   CO2 21 09/23/2019   Lab Results  Component Value Date   ALT 73 (H) 09/23/2019   AST 56 (H) 09/23/2019   ALKPHOS 74 09/23/2019   BILITOT 0.8 09/23/2019   Lab Results  Component Value Date   HGBA1C 5.3 09/23/2019   HGBA1C  5.8 (H) 05/22/2019   HGBA1C 5.9 (H) 12/16/2018   HGBA1C 6.0 10/05/2017   HGBA1C 6.0 03/29/2017   Lab Results  Component Value Date   INSULIN 21.8 09/23/2019   INSULIN 23.0 05/22/2019   INSULIN 45.8 (H) 12/16/2018   Lab Results  Component Value Date   TSH 3.320 05/22/2019   Lab Results  Component Value Date   CHOL 206 (H) 09/23/2019   HDL 61 09/23/2019   LDLCALC 119 (H) 09/23/2019   LDLDIRECT 146.2 10/07/2010   TRIG 149 09/23/2019   CHOLHDL 4 03/29/2018   Lab Results   Component Value Date   WBC 7.8 12/16/2018   HGB 15.1 12/16/2018   HCT 43.6 12/16/2018   MCV 104 (H) 12/16/2018   PLT 164.0 03/29/2018   No results found for: IRON, TIBC, FERRITIN  Obesity Behavioral Intervention Documentation for Insurance:   Approximately 15 minutes were spent on the discussion below.  ASK: We discussed the diagnosis of obesity with Lajarvis today and Timmey agreed to give [place] permission to discuss obesity behavioral modification therapy today.  ASSESS: Stanlee has the diagnosis of obesity and his BMI today is 32.65. Kendricks is in the action stage of change.   ADVISE: Nickolai was educated on the multiple health risks of obesity as well as the benefit of weight loss to improve his health. He was advised of the need for long term treatment and the importance of lifestyle modifications to improve his current health and to decrease his risk of future health problems.  AGREE: Multiple dietary modification options and treatment options were discussed and Iosefa agreed to follow the recommendations documented in the above note.  ARRANGE: Jamarcus was educated on the importance of frequent visits to treat obesity as outlined per CMS and USPSTF guidelines and agreed to schedule his next follow up appointment today.  Attestation Statements:   Reviewed by clinician on day of visit: allergies, medications, problem list, medical history, surgical history, family history, social history, and previous encounter notes.   I, Trixie Dredge, am acting as transcriptionist for [name], MD.  I have reviewed the above documentation for accuracy and completeness, and I agree with the above. -  [name], MD

## 2019-12-04 NOTE — Progress Notes (Signed)
   Covid-19 Vaccination Clinic  Name:  Zachary Valdez    MRN: [id number] DOB: Nov 20, 1950  12/04/2019  [name] was observed post Covid-19 immunization for 15 minutes without incident. He was provided with Vaccine Information Sheet and instruction to access the V-Safe system.   [name] was instructed to call 911 with any severe reactions post vaccine: Marland Kitchen Difficulty breathing  . Swelling of face and throat  . A fast heartbeat  . A bad rash all over body  . Dizziness and weakness   Immunizations Administered    Name Date Dose VIS Date Route   Pfizer COVID-19 Vaccine 12/04/2019  1:12 PM 0.3 mL 09/12/2019 Intramuscular   Manufacturer: [place]   Lot: [id number]   [place]: [id number]

## 2019-12-06 ENCOUNTER — Other Ambulatory Visit: Payer: Self-pay | Admitting: Internal Medicine

## 2019-12-15 ENCOUNTER — Other Ambulatory Visit (INDEPENDENT_AMBULATORY_CARE_PROVIDER_SITE_OTHER): Payer: Self-pay | Admitting: Family Medicine

## 2019-12-15 DIAGNOSIS — E8881 Metabolic syndrome: Secondary | ICD-10-CM

## 2019-12-28 DIAGNOSIS — G4733 Obstructive sleep apnea (adult) (pediatric): Secondary | ICD-10-CM | POA: Diagnosis not present

## 2019-12-31 ENCOUNTER — Encounter (INDEPENDENT_AMBULATORY_CARE_PROVIDER_SITE_OTHER): Payer: Self-pay | Admitting: Family Medicine

## 2019-12-31 ENCOUNTER — Ambulatory Visit (INDEPENDENT_AMBULATORY_CARE_PROVIDER_SITE_OTHER): Payer: Medicare HMO | Admitting: Family Medicine

## 2019-12-31 ENCOUNTER — Other Ambulatory Visit: Payer: Self-pay

## 2019-12-31 VITALS — BP 143/80 | HR 81 | Temp 98.1°F | Ht 71.0 in | Wt 238.0 lb

## 2019-12-31 DIAGNOSIS — E559 Vitamin D deficiency, unspecified: Secondary | ICD-10-CM

## 2019-12-31 DIAGNOSIS — I10 Essential (primary) hypertension: Secondary | ICD-10-CM | POA: Diagnosis not present

## 2019-12-31 DIAGNOSIS — E8881 Metabolic syndrome: Secondary | ICD-10-CM

## 2019-12-31 DIAGNOSIS — E669 Obesity, unspecified: Secondary | ICD-10-CM | POA: Diagnosis not present

## 2019-12-31 DIAGNOSIS — Z6833 Body mass index (BMI) 33.0-33.9, adult: Secondary | ICD-10-CM

## 2019-12-31 MED ORDER — METFORMIN HCL 500 MG PO TABS: 500.0000 mg | ORAL_TABLET | Freq: Every day | ORAL | 0 refills | Status: DC

## 2019-12-31 MED ORDER — VITAMIN D (ERGOCALCIFEROL) 1.25 MG (50000 UNIT) PO CAPS: 50000.0000 [IU] | ORAL_CAPSULE | ORAL | 0 refills | Status: DC

## 2019-12-31 NOTE — Progress Notes (Signed)
Chief Complaint:   OBESITY [name] is here to discuss his progress with his obesity treatment plan along with follow-up of his obesity related diagnoses. [name] is on the Category 3 Plan and states he is following his eating plan approximately 50% of the time. [name] states he went back to work part time.   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 He has resumed consuming butter and mayo, not accounting as snack calories.  Subjective:   1. Vitamin D deficiency Marque's Vit D level on 09/23/2019 was 39.7. he denies excessive fatigue.  2. Insulin resistance Fredrik is tolerating metformin, and he denies GI upset.  3. Essential hypertension Theo's blood pressure is slightly elevated at today's visit. Ambulatory readings, systolic 0000000, diastolic [id number].  Assessment/Plan:   1. Vitamin D deficiency Low Vitamin D level contributes to fatigue and are associated with obesity, breast, and colon cancer. We will refill prescription Vitamin D for 1 month. [name] will follow-up for routine testing of Vitamin D, at least 2-3 times per year to avoid over-replacement. We will recheck labs at his next office visit.  - Vitamin D, Ergocalciferol, (DRISDOL) 1.25 MG (50000 UNIT) CAPS capsule; Take 1 capsule (50,000 Units total) by mouth every 7 (seven) days.  Dispense: 4 capsule; Refill: 0  2. Insulin resistance [name] will continue his Category 3 meal plan, and will continue to work on weight loss, exercise, and decreasing simple carbohydrates to help decrease the risk of diabetes. We will refill metformin for 1 month. We will recheck labs at his next office visit. [name] agreed to follow-up with [place] as directed to  closely monitor his progress.  - metFORMIN (GLUCOPHAGE) 500 MG tablet; Take 1 tablet (500 mg total) by mouth daily with breakfast.  Dispense: 30 tablet; Refill: 0  3. Essential hypertension [name] will continue his Category 3 meal plan, and will continue to work on healthy weight loss and exercise to improve blood pressure control. [name] will continue his current ARB and monitor his blood pressure at home. We will watch for signs of hypotension as he continues his lifestyle modifications.   4. Class 1 obesity with serious comorbidity and body mass index (BMI) of 33.0 to 33.9 in adult, unspecified obesity type [name] is currently in the action stage of change. As such, his goal is to continue with weight loss efforts. He has agreed to the Category 3 Plan.   [name] is to ensure to account for condiments and light beer intake in snack calories.  Exercise goals: As is.  Behavioral modification strategies: decreasing simple carbohydrates and better snacking choices.  [name] has agreed to follow-up with our clinic in 3 to 4 weeks. He was informed of the importance of frequent follow-up visits to maximize his success with intensive lifestyle modifications for his multiple health conditions.   Objective:   Blood pressure (!) 143/80, pulse 81, temperature 98.1 F (36.7 C), temperature source Oral, height 5\' 11"  (1.803 m), weight 238 lb (108 kg), SpO2 96 %. Body mass index is 33.19 kg/m.  General: Cooperative, alert, well developed, in no acute distress. HEENT: Conjunctivae and lids unremarkable. Cardiovascular: Regular rhythm.  Lungs: Normal work of breathing. Neurologic: No focal deficits.  Lab Results  Component Value Date   CREATININE 0.76 09/23/2019   BUN 20 09/23/2019   NA 138 09/23/2019   K 4.9 09/23/2019   CL 100 09/23/2019   CO2 21 09/23/2019   Lab Results  Component Value Date   ALT 73 (H) 09/23/2019   AST 56 (H) 09/23/2019   ALKPHOS 74 09/23/2019   BILITOT 0.8 09/23/2019    Lab Results  Component Value Date   HGBA1C 5.3 09/23/2019   HGBA1C 5.8 (H) 05/22/2019   HGBA1C 5.9 (H) 12/16/2018   HGBA1C 6.0 10/05/2017   HGBA1C 6.0 03/29/2017   Lab Results  Component Value Date   INSULIN 21.8 09/23/2019   INSULIN 23.0 05/22/2019   INSULIN 45.8 (H) 12/16/2018   Lab Results  Component Value Date   TSH 3.320 05/22/2019   Lab Results  Component Value Date   CHOL 206 (H) 09/23/2019   HDL 61 09/23/2019   LDLCALC 119 (H) 09/23/2019   LDLDIRECT 146.2 10/07/2010   TRIG 149 09/23/2019   CHOLHDL 4 03/29/2018   Lab Results  Component Value Date   WBC 7.8 12/16/2018   HGB 15.1 12/16/2018   HCT 43.6 12/16/2018   MCV 104 (H) 12/16/2018   PLT 164.0 03/29/2018   No results found for: IRON, TIBC, FERRITIN  Obesity Behavioral Intervention Documentation for Insurance:   Approximately 15 minutes were spent on the discussion below.  ASK: We discussed the diagnosis of obesity with [name] today and [name] agreed to give [place] permission to discuss obesity behavioral modification therapy today.  ASSESS: [name] has the diagnosis of obesity and his BMI today is 33.21. [name] is in the action stage of change.   ADVISE: [name] was educated on the multiple health risks of obesity as well as the benefit of weight loss to improve his health. He was advised of the need for long term treatment and the importance of lifestyle modifications to improve his current health and to decrease his risk of future health problems.  AGREE: Multiple dietary modification options and treatment options were discussed and [name] agreed to follow the recommendations documented in the above note.  ARRANGE: [name] was educated on the importance of frequent visits to treat obesity as outlined per CMS and USPSTF guidelines and agreed to schedule his next follow up appointment today.  Attestation Statements:   Reviewed by clinician on day of visit: allergies, medications, problem list, medical  history, surgical history, family history, social history, and previous encounter notes.   I, Trixie Dredge, am acting as transcriptionist for [name], MD.  I have reviewed the above documentation for accuracy and completeness, and I agree with the above. -  [name], MD

## 2020-01-28 ENCOUNTER — Ambulatory Visit (INDEPENDENT_AMBULATORY_CARE_PROVIDER_SITE_OTHER): Payer: Medicare HMO | Admitting: Family Medicine

## 2020-01-28 DIAGNOSIS — G4733 Obstructive sleep apnea (adult) (pediatric): Secondary | ICD-10-CM | POA: Diagnosis not present

## 2020-02-05 DIAGNOSIS — G4733 Obstructive sleep apnea (adult) (pediatric): Secondary | ICD-10-CM | POA: Diagnosis not present

## 2020-02-11 ENCOUNTER — Encounter (INDEPENDENT_AMBULATORY_CARE_PROVIDER_SITE_OTHER): Payer: Self-pay | Admitting: Family Medicine

## 2020-02-11 DIAGNOSIS — H524 Presbyopia: Secondary | ICD-10-CM | POA: Diagnosis not present

## 2020-02-11 DIAGNOSIS — H1045 Other chronic allergic conjunctivitis: Secondary | ICD-10-CM | POA: Diagnosis not present

## 2020-02-11 DIAGNOSIS — H35033 Hypertensive retinopathy, bilateral: Secondary | ICD-10-CM | POA: Diagnosis not present

## 2020-02-11 DIAGNOSIS — H0102B Squamous blepharitis left eye, upper and lower eyelids: Secondary | ICD-10-CM | POA: Diagnosis not present

## 2020-02-11 DIAGNOSIS — H2513 Age-related nuclear cataract, bilateral: Secondary | ICD-10-CM | POA: Diagnosis not present

## 2020-02-11 DIAGNOSIS — H538 Other visual disturbances: Secondary | ICD-10-CM | POA: Diagnosis not present

## 2020-02-11 DIAGNOSIS — H0102A Squamous blepharitis right eye, upper and lower eyelids: Secondary | ICD-10-CM | POA: Diagnosis not present

## 2020-02-11 DIAGNOSIS — E119 Type 2 diabetes mellitus without complications: Secondary | ICD-10-CM | POA: Diagnosis not present

## 2020-02-11 DIAGNOSIS — H5203 Hypermetropia, bilateral: Secondary | ICD-10-CM | POA: Diagnosis not present

## 2020-02-11 NOTE — Telephone Encounter (Signed)
Please advise 

## 2020-02-26 ENCOUNTER — Other Ambulatory Visit: Payer: Self-pay | Admitting: Internal Medicine

## 2020-02-27 DIAGNOSIS — G4733 Obstructive sleep apnea (adult) (pediatric): Secondary | ICD-10-CM | POA: Diagnosis not present

## 2020-03-29 DIAGNOSIS — G4733 Obstructive sleep apnea (adult) (pediatric): Secondary | ICD-10-CM | POA: Diagnosis not present

## 2020-04-28 DIAGNOSIS — G4733 Obstructive sleep apnea (adult) (pediatric): Secondary | ICD-10-CM | POA: Diagnosis not present

## 2020-05-29 DIAGNOSIS — G4733 Obstructive sleep apnea (adult) (pediatric): Secondary | ICD-10-CM | POA: Diagnosis not present

## 2020-06-16 DIAGNOSIS — H5203 Hypermetropia, bilateral: Secondary | ICD-10-CM | POA: Diagnosis not present

## 2020-06-16 DIAGNOSIS — H52209 Unspecified astigmatism, unspecified eye: Secondary | ICD-10-CM | POA: Diagnosis not present

## 2020-06-16 DIAGNOSIS — H524 Presbyopia: Secondary | ICD-10-CM | POA: Diagnosis not present

## 2020-06-29 DIAGNOSIS — G4733 Obstructive sleep apnea (adult) (pediatric): Secondary | ICD-10-CM | POA: Diagnosis not present

## 2020-07-12 DIAGNOSIS — L720 Epidermal cyst: Secondary | ICD-10-CM | POA: Diagnosis not present

## 2020-07-28 ENCOUNTER — Ambulatory Visit (INDEPENDENT_AMBULATORY_CARE_PROVIDER_SITE_OTHER): Payer: Medicare HMO | Admitting: Internal Medicine

## 2020-07-28 ENCOUNTER — Other Ambulatory Visit: Payer: Self-pay

## 2020-07-28 ENCOUNTER — Encounter: Payer: Self-pay | Admitting: Internal Medicine

## 2020-07-28 VITALS — BP 142/78 | HR 99 | Temp 98.5°F | Wt 252.4 lb

## 2020-07-28 DIAGNOSIS — Z23 Encounter for immunization: Secondary | ICD-10-CM | POA: Diagnosis not present

## 2020-07-28 DIAGNOSIS — E785 Hyperlipidemia, unspecified: Secondary | ICD-10-CM

## 2020-07-28 DIAGNOSIS — M79604 Pain in right leg: Secondary | ICD-10-CM

## 2020-07-28 DIAGNOSIS — E8881 Metabolic syndrome: Secondary | ICD-10-CM

## 2020-07-28 DIAGNOSIS — R7303 Prediabetes: Secondary | ICD-10-CM

## 2020-07-28 DIAGNOSIS — M79605 Pain in left leg: Secondary | ICD-10-CM

## 2020-07-28 DIAGNOSIS — Z6835 Body mass index (BMI) 35.0-35.9, adult: Secondary | ICD-10-CM

## 2020-07-28 DIAGNOSIS — E538 Deficiency of other specified B group vitamins: Secondary | ICD-10-CM | POA: Diagnosis not present

## 2020-07-28 MED ORDER — METFORMIN HCL 500 MG PO TABS: 500.0000 mg | ORAL_TABLET | Freq: Every day | ORAL | 3 refills | Status: DC

## 2020-07-28 MED ORDER — PANTOPRAZOLE SODIUM 40 MG PO TBEC
40.0000 mg | DELAYED_RELEASE_TABLET | ORAL | 3 refills | Status: DC | PRN
Start: 2020-07-28 — End: 2021-08-01

## 2020-07-28 MED ORDER — DICLOFENAC SODIUM 50 MG PO TBEC: 50.0000 mg | DELAYED_RELEASE_TABLET | Freq: Two times a day (BID) | ORAL | 1 refills | Status: DC | PRN

## 2020-07-28 MED ORDER — DOXYCYCLINE HYCLATE 100 MG PO TABS: 100.0000 mg | ORAL_TABLET | Freq: Two times a day (BID) | ORAL | 0 refills | Status: DC

## 2020-07-28 NOTE — Progress Notes (Signed)
Subjective:  Patient ID: Zachary Valdez, male    DOB: [birth year]  Age: 69 y.o. MRN: [id number]  CC: Cyst (Pt states h had a cyst on his neck, but he saw a dermatologist and they removed it. still want MD to check it out) and Medication Refill (Discuss Metformin)   HPI [name] presents for cyst on L neck base C/o drainage post I&D by Dermatology  C/o feet numbness, pain x 2 years    Outpatient Medications Prior to Visit  Medication Sig Dispense Refill  . aspirin EC 81 MG tablet Take 1 tablet (81 mg total) by mouth daily. 100 tablet 3  . B COMPLEX-C-E PO Take 1 tablet by mouth daily.    . diclofenac (VOLTAREN) 50 MG EC tablet Take 1 tablet (50 mg total) by mouth 2 (two) times daily as needed. 180 tablet 2  . gabapentin (NEURONTIN) 100 MG capsule Take 2 capsules (200 mg total) by mouth at bedtime. 180 capsule 0  . metFORMIN (GLUCOPHAGE) 500 MG tablet Take 1 tablet (500 mg total) by mouth daily with breakfast. 30 tablet 0  . olmesartan (BENICAR) 40 MG tablet TAKE ONE TABLET BY MOUTH DAILY 90 tablet 3  . pantoprazole (PROTONIX) 40 MG tablet Take 1 tablet (40 mg total) by mouth as needed. 90 tablet 0  . rosuvastatin (CRESTOR) 40 MG tablet TAKE ONE TABLET BY MOUTH DAILY 90 tablet 1  . tadalafil (CIALIS) 20 MG tablet Take 1 tablet (20 mg total) by mouth daily as needed for erectile dysfunction. 30 tablet 3  . vitamin B-12 (CYANOCOBALAMIN) 1000 MCG tablet Take 1 tablet (1,000 mcg total) by mouth daily. 100 tablet 3  . Vitamin D, Ergocalciferol, (DRISDOL) 1.25 MG (50000 UNIT) CAPS capsule Take 1 capsule (50,000 Units total) by mouth every 7 (seven) days. 4 capsule 0   No facility-administered medications prior to visit.    ROS: Review of Systems  Constitutional: Positive for unexpected weight change. Negative for appetite change and fatigue.  HENT: Negative for congestion, nosebleeds, sneezing, sore throat and trouble swallowing.   Eyes: Negative for itching and visual disturbance.   Respiratory: Negative for cough.   Cardiovascular: Negative for chest pain, palpitations and leg swelling.  Gastrointestinal: Negative for abdominal distention, blood in stool, diarrhea and nausea.  Genitourinary: Negative for frequency and hematuria.  Musculoskeletal: Negative for back pain, gait problem, joint swelling and neck pain.  Skin: Positive for rash.  Neurological: Positive for numbness. Negative for dizziness, tremors, speech difficulty and weakness.  Psychiatric/Behavioral: Negative for agitation, dysphoric mood and sleep disturbance. The patient is not nervous/anxious.     Objective:  BP (!) 142/78 (BP Location: Left Arm)   Pulse 99   Temp 98.5 F (36.9 C) (Oral)   Wt 252 lb 6.4 oz (114.5 kg)   SpO2 96%   BMI 35.20 kg/m   BP Readings from Last 3 Encounters:  07/28/20 (!) 142/78  12/31/19 (!) 143/80  12/03/19 131/75    Wt Readings from Last 3 Encounters:  07/28/20 252 lb 6.4 oz (114.5 kg)  12/31/19 238 lb (108 kg)  12/03/19 234 lb (106.1 kg)    Physical Exam Constitutional:      General: He is not in acute distress.    Appearance: He is well-developed.     Comments: NAD  Eyes:     Conjunctiva/sclera: Conjunctivae normal.     Pupils: Pupils are equal, round, and reactive to light.  Neck:     Thyroid: No thyromegaly.  Vascular: No JVD.  Cardiovascular:     Rate and Rhythm: Normal rate and regular rhythm.     Heart sounds: Normal heart sounds. No murmur heard.  No friction rub. No gallop.   Pulmonary:     Effort: Pulmonary effort is normal. No respiratory distress.     Breath sounds: Normal breath sounds. No wheezing or rales.  Chest:     Chest wall: No tenderness.  Abdominal:     General: Bowel sounds are normal. There is no distension.     Palpations: Abdomen is soft. There is no mass.     Tenderness: There is no abdominal tenderness. There is no guarding or rebound.  Musculoskeletal:        General: Tenderness present. Normal range of motion.      Cervical back: Normal range of motion.     Left lower leg: Edema present.  Lymphadenopathy:     Cervical: No cervical adenopathy.  Skin:    General: Skin is warm and dry.     Findings: No rash.  Neurological:     Mental Status: He is alert and oriented to person, place, and time.     Cranial Nerves: No cranial nerve deficit.     Motor: No abnormal muscle tone.     Coordination: Coordination normal.     Gait: Gait abnormal.     Deep Tendon Reflexes: Reflexes are normal and symmetric.  Psychiatric:        Behavior: Behavior normal.        Thought Content: Thought content normal.        Judgment: Judgment normal.     Lab Results  Component Value Date   WBC 7.8 12/16/2018   HGB 15.1 12/16/2018   HCT 43.6 12/16/2018   PLT 164.0 03/29/2018   GLUCOSE 103 (H) 09/23/2019   CHOL 206 (H) 09/23/2019   TRIG 149 09/23/2019   HDL 61 09/23/2019   LDLDIRECT 146.2 10/07/2010   LDLCALC 119 (H) 09/23/2019   ALT 73 (H) 09/23/2019   AST 56 (H) 09/23/2019   NA 138 09/23/2019   K 4.9 09/23/2019   CL 100 09/23/2019   CREATININE 0.76 09/23/2019   BUN 20 09/23/2019   CO2 21 09/23/2019   TSH 3.320 05/22/2019   PSA 0.59 03/29/2018   INR 1.02 08/31/2014   HGBA1C 5.3 09/23/2019    DG Knee Complete 4 Views Right  Result Date: 12/04/2018 CLINICAL DATA:  Chronic right knee pain for 5 years. No known injury. EXAM: RIGHT KNEE - COMPLETE 4+ VIEW COMPARISON:  None. FINDINGS: Tricompartment degenerative changes with joint space narrowing and spurring, most pronounced in the medial and patellofemoral compartments. No significant joint effusion. No acute bony abnormality. Specifically, no fracture, subluxation, or dislocation. Vascular calcifications noted. IMPRESSION: Moderate tricompartment degenerative changes, most pronounced in the medial and patellofemoral compartments. No acute bony abnormality. Electronically Signed   By: Rolm Baptise M.D.   On: 12/04/2018 08:42    Assessment & Plan:   There  are no diagnoses linked to this encounter.   No orders of the defined types were placed in this encounter.    Follow-up: No follow-ups on file.  Walker Kehr, MD

## 2020-07-28 NOTE — Assessment & Plan Note (Signed)
Wt Readings from Last 3 Encounters:  07/28/20 252 lb 6.4 oz (114.5 kg)  12/31/19 238 lb (108 kg)  12/03/19 234 lb (106.1 kg)

## 2020-07-28 NOTE — Assessment & Plan Note (Signed)
Labs

## 2020-07-28 NOTE — Patient Instructions (Addendum)
You can try [place] Mushroom extract or capsules      Gluten free trial for 4-6 weeks. OK to use gluten-free bread and gluten-free pasta.    Gluten-Free Diet for Celiac Disease, Adult The gluten-free diet includes all foods that do not contain gluten. Gluten is a protein that is found in wheat, rye, barley, and some other grains. Following the gluten-free diet is the only treatment for people with celiac disease. It helps to prevent damage to the intestines and improves or eliminates the symptoms of celiac disease. Following the gluten-free diet requires some planning. It can be challenging at first, but it gets easier with time and practice. There are more gluten-free options available today than ever before. If you need help finding gluten-free foods or if you have questions, talk with your diet and nutrition specialist (registered dietitian) or your health care provider. What do I need to know about a gluten-free diet?  All fruits, vegetables, and meats are safe to eat and do not contain gluten.  When grocery shopping, start by shopping in the produce, meat, and dairy sections. These sections are more likely to contain gluten-free foods. Then move to the aisles that contain packaged foods if you need to.  Read all food labels. Gluten is often added to foods. Always check the ingredient list and look for warnings, such as "may contain gluten."  Talk with your dietitian or health care provider before taking a gluten-free multivitamin or mineral supplement.  Be aware of gluten-free foods having contact with foods that contain gluten (cross-contamination). This can happen at home and with any processed foods. ? Talk with your health care provider or dietitian about how to reduce the risk of cross-contamination in your home. ? If you have questions about how a food is processed, ask the manufacturer. What key words help to identify gluten? Foods that list any of these key words on the  label usually contain gluten:  Wheat, flour, enriched flour, bromated flour, white flour, durum flour, graham flour, phosphated flour, self-rising flour, semolina, farina, barley (malt), rye, and oats.  Starch, dextrin, modified food starch, or cereal.  Thickening, fillers, or emulsifiers.  Malt flavoring, malt extract, or malt syrup.  Hydrolyzed vegetable protein.  In the [place] packaged foods that are gluten-free are required to be labeled "GF." These foods should be easy to identify and are safe to eat. In the [place] food companies are also required to list common food allergens, including wheat, on their labels. Recommended foods Grains  Amaranth, bean flours, 100% buckwheat flour, corn, millet, nut flours or nut meals, GF oats, quinoa, rice, sorghum, teff, rice wafers, pure cornmeal tortillas, popcorn, and hot cereals made from cornmeal. Hominy, rice, wild rice. Some Asian rice noodles or bean noodles. Arrowroot starch, corn bran, corn flour, corn germ, cornmeal, corn starch, potato flour, potato starch flour, and rice bran. Plain, brown, and sweet rice flours. Rice polish, soy flour, and tapioca starch. Vegetables  All plain fresh, frozen, and canned vegetables. Fruits  All plain fresh, frozen, canned, and dried fruits, and 100% fruit juices. Meats and other protein foods  All fresh beef, pork, poultry, fish, seafood, and eggs. Fish canned in water, oil, brine, or vegetable broth. Plain nuts and seeds, peanut butter. Some lunch meat and some frankfurters. Dried beans, dried peas, and lentils. Dairy  Fresh plain, dry, evaporated, or condensed milk. Cream, butter, sour cream, whipping cream, and most yogurts. Unprocessed cheese, most processed cheeses, some cottage cheese, some cream cheeses. Beverages  Coffee, tea, most herbal teas. Carbonated beverages and some root beers. Wine, sake, and distilled spirits, such as gin, vodka, and whiskey. Most hard ciders. Fats and  oils  Butter, margarine, vegetable oil, hydrogenated butter, olive oil, shortening, lard, cream, and some mayonnaise. Some commercial salad dressings. Olives. Sweets and desserts  Sugar, honey, some syrups, molasses, jelly, and jam. Plain hard candy, marshmallows, and gumdrops. Pure cocoa powder. Plain chocolate. Custard and some pudding mixes. Gelatin desserts, sorbets, frozen ice pops, and sherbet. Cake, cookies, and other desserts prepared with allowed flours. Some commercial ice creams. Cornstarch, tapioca, and rice puddings. Seasoning and other foods  Some canned or frozen soups. Monosodium glutamate (MSG). Cider, rice, and wine vinegar. Baking soda and baking powder. Cream of tartar. Baking and nutritional yeast. Certain soy sauces made without wheat (ask your dietitian about specific brands that are allowed). Nuts, coconut, and chocolate. Salt, pepper, herbs, spices, flavoring extracts, imitation or artificial flavorings, natural flavorings, and food colorings. Some medicines and supplements. Some lip glosses and other cosmetics. Rice syrups. The items listed may not be a complete list. Talk with your dietitian about what dietary choices are best for you. Foods to avoid Grains  Barley, bran, bulgur, couscous, cracked wheat, [place], farro, graham, malt, matzo, semolina, wheat germ, and all wheat and rye cereals including spelt and kamut. Cereals containing malt as a flavoring, such as rice cereal. Noodles, spaghetti, macaroni, most packaged rice mixes, and all mixes containing wheat, rye, barley, or triticale. Vegetables  Most creamed vegetables and most vegetables canned in sauces. Some commercially prepared vegetables and salads. Fruits  Thickened or prepared fruits and some pie fillings. Some fruit snacks and fruit roll-ups. Meats and other protein foods  Any meat or meat alternative containing wheat, rye, barley, or gluten stabilizers. These are often marinated or packaged meats and  lunch meats. Bread-containing products, such as Swiss steak, croquettes, meatballs, and meatloaf. Most tuna canned in vegetable broth and [place] with hydrolyzed vegetable protein (HVP) injected as part of the basting. [place]. Imitation fish. Eggs in sauces made from ingredients to avoid. Dairy  Commercial chocolate milk drinks and malted milk. Some non-dairy creamers. Any cheese product containing ingredients to avoid. Beverages  Certain cereal beverages. Beer, ale, malted milk, and some root beers. Some hard ciders. Some instant flavored coffees. Some herbal teas made with barley or with barley malt added. Fats and oils  Some commercial salad dressings. Sour cream containing modified food starch. Sweets and desserts  Some toffees. Chocolate-coated nuts (may be rolled in wheat flour) and some commercial candies and candy bars. Most cakes, cookies, donuts, pastries, and other baked goods. Some commercial ice cream. Ice cream cones. Commercially prepared mixes for cakes, cookies, and other desserts. Bread pudding and other puddings thickened with flour. Products containing brown rice syrup made with barley malt enzyme. Desserts and sweets made with malt flavoring. Seasoning and other foods  Some curry powders, some dry seasoning mixes, some gravy extracts, some meat sauces, some ketchups, some prepared mustards, and horseradish. Certain soy sauces. Malt vinegar. Bouillon and bouillon cubes that contain HVP. Some chip dips, and some chewing gum. Yeast extract. Brewer's yeast. Caramel color. Some medicines and supplements. Some lip glosses and other cosmetics. The items listed may not be a complete list. Talk with your dietitian about what dietary choices are best for you. Summary  Gluten is a protein that is found in wheat, rye, barley, and some other grains. The gluten-free diet includes all foods that do not contain  gluten.  If you need help finding gluten-free foods or if you have questions,  talk with your diet and nutrition specialist (registered dietitian) or your health care provider.  Read all food labels. Gluten is often added to foods. Always check the ingredient list and look for warnings, such as "may contain gluten." This information is not intended to replace advice given to you by your health care provider. Make sure you discuss any questions you have with your health care provider. Document Released: 09/18/2005 Document Revised: 07/03/2016 Document Reviewed: 07/03/2016 Elsevier Interactive Patient Education  2018 [employer].

## 2020-07-28 NOTE — Assessment & Plan Note (Signed)
On B12 

## 2020-07-28 NOTE — Assessment & Plan Note (Addendum)
?  neuropathy vs other R/o PVD - check Doppler US Labs Gluen free diet Gabapentin prn Try Nervestra Cut back on alcohol

## 2020-07-28 NOTE — Assessment & Plan Note (Signed)
crestor

## 2020-07-29 DIAGNOSIS — G4733 Obstructive sleep apnea (adult) (pediatric): Secondary | ICD-10-CM | POA: Diagnosis not present

## 2020-08-11 ENCOUNTER — Ambulatory Visit (HOSPITAL_COMMUNITY)
Admission: RE | Admit: 2020-08-11 | Payer: Medicare HMO | Source: Ambulatory Visit | Attending: Internal Medicine | Admitting: Internal Medicine

## 2020-08-11 ENCOUNTER — Encounter (HOSPITAL_COMMUNITY): Payer: Self-pay

## 2020-08-29 DIAGNOSIS — G4733 Obstructive sleep apnea (adult) (pediatric): Secondary | ICD-10-CM | POA: Diagnosis not present

## 2020-09-28 DIAGNOSIS — G4733 Obstructive sleep apnea (adult) (pediatric): Secondary | ICD-10-CM | POA: Diagnosis not present

## 2020-12-02 ENCOUNTER — Ambulatory Visit (INDEPENDENT_AMBULATORY_CARE_PROVIDER_SITE_OTHER): Payer: Medicare HMO

## 2020-12-02 ENCOUNTER — Other Ambulatory Visit: Payer: Self-pay

## 2020-12-02 VITALS — BP 130/88 | HR 101 | Temp 98.0°F | Ht 71.0 in | Wt 268.4 lb

## 2020-12-02 DIAGNOSIS — Z0000 Encounter for general adult medical examination without abnormal findings: Secondary | ICD-10-CM | POA: Diagnosis not present

## 2020-12-02 NOTE — Progress Notes (Addendum)
Subjective:   Zachary Valdez is a 70 y.o. male who presents for Medicare Annual/Subsequent preventive examination.  Review of Systems    No ROS. Medicare Wellness Visit. Additional risk factors are reflected in social history. Cardiac Risk Factors include: advanced age (>63men, >41 women);dyslipidemia;family history of premature cardiovascular disease;hypertension;male gender;obesity (BMI >30kg/m2)     Objective:    Today's Vitals   12/02/20 1405  BP: 130/88  Pulse: (!) 101  Temp: 98 F (36.7 C)  SpO2: 95%  Weight: 268 lb 6.4 oz (121.7 kg)  Height: 5\' 11"  (1.803 m)  PainSc: 0-No pain   Body mass index is 37.43 kg/m.  Advanced Directives 12/02/2020 06/03/2019 06/07/2016 05/24/2016 09/07/2014 08/31/2014  Does Patient Have a Medical Advance Directive? Yes Yes No Yes Yes Yes  Type of Advance Directive - [place];Living will - Living will Living will Living will  Does patient want to make changes to medical advance directive? No - Patient declined - - - No - Patient declined No - Patient declined  Copy of Healthcare Power of Attorney in Chart? - No - copy requested - No - copy requested No - copy requested No - copy requested  Would patient like information on creating a medical advance directive? - - No - patient declined information - - -    Current Medications (verified) Outpatient Encounter Medications as of 12/02/2020  Medication Sig   aspirin EC 81 MG tablet Take 1 tablet (81 mg total) by mouth daily.   diclofenac (VOLTAREN) 50 MG EC tablet Take 1 tablet (50 mg total) by mouth 2 (two) times daily as needed for moderate pain.   doxycycline (VIBRA-TABS) 100 MG tablet Take 1 tablet (100 mg total) by mouth 2 (two) times daily.   gabapentin (NEURONTIN) 100 MG capsule Take 2 capsules (200 mg total) by mouth at bedtime.   metFORMIN (GLUCOPHAGE) 500 MG tablet Take 1 tablet (500 mg total) by mouth daily with breakfast.   olmesartan (BENICAR) 40 MG tablet TAKE ONE  TABLET BY MOUTH DAILY   pantoprazole (PROTONIX) 40 MG tablet Take 1 tablet (40 mg total) by mouth as needed.   rosuvastatin (CRESTOR) 40 MG tablet TAKE ONE TABLET BY MOUTH DAILY   tadalafil (CIALIS) 20 MG tablet Take 1 tablet (20 mg total) by mouth daily as needed for erectile dysfunction.   vitamin B-12 (CYANOCOBALAMIN) 1000 MCG tablet Take 1 tablet (1,000 mcg total) by mouth daily.   Vitamin D, Ergocalciferol, (DRISDOL) 1.25 MG (50000 UNIT) CAPS capsule Take 1 capsule (50,000 Units total) by mouth every 7 (seven) days.   No facility-administered encounter medications on file as of 12/02/2020.    Allergies (verified) Atacand [candesartan cilexetil], Atorvastatin, Enalapril, Hydrocodone-acetaminophen, Losartan, Nexium [esomeprazole], Tramadol, and Wellbutrin [bupropion]   History: Past Medical History:  Diagnosis Date   Allergic rhinitis    Anxiety    GERD (gastroesophageal reflux disease)    Hyperlipidemia    Hyperplastic colon polyp 2006   Hypertension    OA (osteoarthritis)    Obesity    Sleep apnea    cpap-    Ulcerative esophagitis 2006   Dr. Fuller Plan   Vitamin B 12 deficiency    Past Surgical History:  Procedure Laterality Date   KNEE ARTHROSCOPY     Right   TOTAL KNEE ARTHROPLASTY Left 09/07/2014   Procedure: LEFT TOTAL KNEE ARTHROPLASTY;  Surgeon: Gearlean Alf, MD;  Location: [facility];  Service: Orthopedics;  Laterality: Left;   Family History  Problem  Relation Age of Onset   Breast cancer Mother    Hypertension Mother    Hypertension Other    Colon cancer Neg Hx    Social History   Socioeconomic History   Marital status: Divorced    Spouse name: Not on file   Number of children: Not on file   Years of education: Not on file   Highest education level: Not on file  Occupational History   Occupation: retired  Tobacco Use   Smoking status: Former Smoker    Types: Cigarettes    Quit date: 10/02/1997    Years since quitting: 23.1   Smokeless tobacco: Never Used   Vaping Use   Vaping Use: Never used  Substance and Sexual Activity   Alcohol use: Yes    Alcohol/week: 14.0 standard drinks    Types: 14 Glasses of wine per week   Drug use: No   Sexual activity: Not Currently  Other Topics Concern   Not on file  Social History Narrative   Daily caffeine- 2-3 per day   Social Determinants of Health   Financial Resource Strain: Low Risk    Difficulty of Paying Living Expenses: Not hard at all  Food Insecurity: No Food Insecurity   Worried About Charity fundraiser in the Last Year: Never true   Ran Out of Food in the Last Year: Never true  Transportation Needs: No Transportation Needs   Lack of Transportation (Medical): No   Lack of Transportation (Non-Medical): No  Physical Activity: Inactive   Days of Exercise per Week: 0 days   Minutes of Exercise per Session: 0 min  Stress: No Stress Concern Present   Feeling of Stress : Not at all  Social Connections: Moderately Integrated   Frequency of Communication with Friends and Family: More than three times a week   Frequency of Social Gatherings with Friends and Family: More than three times a week   Attends Religious Services: 1 to 4 times per year   Active Member of [employer] or Organizations: No   Attends Music therapist: 1 to 4 times per year   Marital Status: Never married    Tobacco Counseling Counseling given: Not Answered   Clinical Intake:  Pre-visit preparation completed: Yes  Pain : No/denies pain Pain Score: 0-No pain     BMI - recorded: 37.43 Nutritional Status: BMI > 30  Obese Nutritional Risks: None Diabetes: No  How often do you need to have someone help you when you read instructions, pamphlets, or other written materials from your doctor or pharmacy?: 1 - Never What is the last grade level you completed in school?: HSG; some college courses  Diabetic? no  Interpreter Needed?: No  Information entered by :: Lisette Abu, LPN   Activities of  Daily Living In your present state of health, do you have any difficulty performing the following activities: 12/02/2020  Hearing? N  Vision? N  Difficulty concentrating or making decisions? N  Walking or climbing stairs? N  Dressing or bathing? N  Doing errands, shopping? N  Preparing Food and eating ? N  Using the Toilet? N  In the past six months, have you accidently leaked urine? N  Do you have problems with loss of bowel control? N  Managing your Medications? N  Managing your Finances? N  Housekeeping or managing your Housekeeping? N  Some recent data might be hidden    Patient Care Team: Plotnikov, Evie Lacks, MD as PCP - General Clent Jacks,  MD as Consulting Physician (Ophthalmology)  Indicate any recent Medical Services you may have received from other than [facility] providers in the past year (date may be approximate).     Assessment:   This is a routine wellness examination for Zachary Valdez.  Hearing/Vision screen No exam data present  Dietary issues and exercise activities discussed: Current Exercise Habits: The patient does not participate in regular exercise at present, Exercise limited by: orthopedic condition(s);cardiac condition(s)  Goals      Patient Stated     I want to continue to lose weight. I will continue going to Dr. Leafy Ro and I want to get my knee to feeling better.        Depression Screen PHQ 2/9 Scores 12/02/2020 06/03/2019 12/16/2018 03/12/2018 10/04/2017  PHQ - 2 Score 0 1 3 0 0  PHQ- 9 Score - - 10 - -    Fall Risk Fall Risk  12/02/2020 06/03/2019 03/12/2018  Falls in the past year? 0 0 No  Number falls in past yr: 0 0 -  Injury with Fall? 0 0 -  Risk for fall due to : No Fall Risks - -  Follow up Falls evaluation completed - -    FALL RISK PREVENTION PERTAINING TO THE HOME:  Any stairs in or around the home? No  If so, are there any without handrails? No  Home free of loose throw rugs in walkways, pet beds, electrical cords, etc? Yes  Adequate  lighting in your home to reduce risk of falls? Yes   ASSISTIVE DEVICES UTILIZED TO PREVENT FALLS:  Life alert? No  Use of a cane, walker or w/c? No  Grab bars in the bathroom? No  Shower chair or bench in shower? No  Elevated toilet seat or a handicapped toilet? No   TIMED UP AND GO:  Was the test performed? No .  Length of time to ambulate 10 feet: 0 sec.   Gait steady and fast without use of assistive device  Cognitive Function: Normal cognitive status assessed by direct observation by this Nurse Health Advisor. No abnormalities found.          Immunizations Immunization History  Administered Date(s) Administered   Fluad Quad(high Dose 65+) 07/28/2020   Influenza Whole 10/03/2005, 07/03/2011   Influenza,inj,Quad PF,6+ Mos 07/12/2019   Influenza,inj,quad, With Preservative 07/09/2019   Influenza-Unspecified 07/10/2016, 07/19/2017, 07/04/2018   PFIZER(Purple Top)SARS-COV-2 Vaccination 11/10/2019, 12/04/2019   Td 04/30/1998, 10/02/2006   Tdap 07/06/2013    TDAP status: Up to date  Flu Vaccine status: Up to date  Pneumococcal vaccine status: Declined,  Education has been provided regarding the importance of this vaccine but patient still declined. Advised may receive this vaccine at local pharmacy or Health Dept. Aware to provide a copy of the vaccination record if obtained from local pharmacy or Health Dept. Verbalized acceptance and understanding.   Covid-19 vaccine status: Completed vaccines  Qualifies for Shingles Vaccine? Yes   Zostavax completed No   Shingrix Completed?: No.    Education has been provided regarding the importance of this vaccine. Patient has been advised to call insurance company to determine out of pocket expense if they have not yet received this vaccine. Advised may also receive vaccine at local pharmacy or Health Dept. Verbalized acceptance and understanding.  Screening Tests Health Maintenance  Topic Date Due   PNA vac Low Risk Adult (1 of  2 - PCV13) Never done   COVID-19 Vaccine (3 - Pfizer risk 4-dose series) 01/01/2020   COLONOSCOPY (Pts  45-68yrs Insurance coverage will need to be confirmed)  06/07/2021   TETANUS/TDAP  07/07/2023   INFLUENZA VACCINE  Completed   Hepatitis C Screening  Completed   HPV VACCINES  Aged Out    Health Maintenance  Health Maintenance Due  Topic Date Due   PNA vac Low Risk Adult (1 of 2 - PCV13) Never done   COVID-19 Vaccine (3 - Pfizer risk 4-dose series) 01/01/2020    Colorectal cancer screening: Type of screening: Colonoscopy. Completed 06/07/2016. Repeat every 5 years  Lung Cancer Screening: (Low Dose CT Chest recommended if Age 10-80 years, 30 pack-year currently smoking OR have quit w/in 15years.) does qualify.   Lung Cancer Screening Referral: no  Additional Screening:  Hepatitis C Screening: does qualify; Completed yes  Vision Screening: Recommended annual ophthalmology exams for early detection of glaucoma and other disorders of the eye. Is the patient up to date with their annual eye exam?  Yes  Who is the provider or what is the name of the office in which the patient attends annual eye exams? [facility] If pt is not established with a provider, would they like to be referred to a provider to establish care? No .   Dental Screening: Recommended annual dental exams for proper oral hygiene  Community Resource Referral / Chronic Care Management: CRR required this visit?  No   CCM required this visit?  No      Plan:     I have personally reviewed and noted the following in the patient's chart:   Medical and social history Use of alcohol, tobacco or illicit drugs  Current medications and supplements Functional ability and status Nutritional status Physical activity Advanced directives List of other physicians Hospitalizations, surgeries, and ER visits in previous 12 months Vitals Screenings to include cognitive, depression, and falls Referrals and  appointments  In addition, I have reviewed and discussed with patient certain preventive protocols, quality metrics, and best practice recommendations. A written personalized care plan for preventive services as well as general preventive health recommendations were provided to patient.     Sheral Flow, LPN   10/07/1094   Nurse Notes:  Medications reviewed with patient; no opioid use noted.  Medical screening examination/treatment/procedure(s) were performed by non-physician practitioner and as supervising physician I was immediately available for consultation/collaboration.  I agree with above. Lew Dawes, MD

## 2020-12-02 NOTE — Patient Instructions (Signed)
[name] , Thank you for taking time to come for your Medicare Wellness Visit. I appreciate your ongoing commitment to your health goals. Please review the following plan we discussed and let me know if I can assist you in the future.   Screening recommendations/referrals: Colonoscopy: 06/07/2016; due every 5 years  Recommended yearly ophthalmology/optometry visit for glaucoma screening and checkup Recommended yearly dental visit for hygiene and checkup  Vaccinations: Influenza vaccine: 07/28/2020 Pneumococcal vaccine: declined   Tdap vaccine: 07/06/2013; due every 10 years Shingles vaccine: never done   Covid-19: 11/10/2019, 12/04/2019  Advanced directives: Documents on file  Conditions/risks identified: Yes; Reviewed health maintenance screenings with patient today and relevant education, vaccines, and/or referrals were provided. Please continue to do your personal lifestyle choices by: daily care of teeth and gums, regular physical activity (goal should be 5 days a week for 30 minutes), eat a healthy diet, avoid tobacco and drug use, limiting any alcohol intake, taking a low-dose aspirin (if not allergic or have been advised by your provider otherwise) and taking vitamins and minerals as recommended by your provider. Continue doing brain stimulating activities (puzzles, reading, adult coloring books, staying active) to keep memory sharp. Continue to eat heart healthy diet (full of fruits, vegetables, whole grains, lean protein, water--limit salt, fat, and sugar intake) and increase physical activity as tolerated.  Next appointment: Please schedule your next Medicare Wellness Visit with your Nurse Health Advisor in 1 year by calling [phone].  Preventive Care 68 Years and Older, Male Preventive care refers to lifestyle choices and visits with your health care provider that can promote health and wellness. What does preventive care include?  A yearly physical exam. This is also called an  annual well check.  Dental exams once or twice a year.  Routine eye exams. Ask your health care provider how often you should have your eyes checked.  Personal lifestyle choices, including:  Daily care of your teeth and gums.  Regular physical activity.  Eating a healthy diet.  Avoiding tobacco and drug use.  Limiting alcohol use.  Practicing safe sex.  Taking low doses of aspirin every day.  Taking vitamin and mineral supplements as recommended by your health care provider. What happens during an annual well check? The services and screenings done by your health care provider during your annual well check will depend on your age, overall health, lifestyle risk factors, and family history of disease. Counseling  Your health care provider may ask you questions about your:  Alcohol use.  Tobacco use.  Drug use.  Emotional well-being.  Home and relationship well-being.  Sexual activity.  Eating habits.  History of falls.  Memory and ability to understand (cognition).  Work and work Statistician. Screening  You may have the following tests or measurements:  Height, weight, and BMI.  Blood pressure.  Lipid and cholesterol levels. These may be checked every 5 years, or more frequently if you are over 17 years old.  Skin check.  Lung cancer screening. You may have this screening every year starting at age 33 if you have a 30-pack-year history of smoking and currently smoke or have quit within the past 15 years.  Fecal occult blood test (FOBT) of the stool. You may have this test every year starting at age 9.  Flexible sigmoidoscopy or colonoscopy. You may have a sigmoidoscopy every 5 years or a colonoscopy every 10 years starting at age 36.  Prostate cancer screening. Recommendations will vary depending on your family history and  other risks.  Hepatitis C blood test.  Hepatitis B blood test.  Sexually transmitted disease (STD) testing.  Diabetes  screening. This is done by checking your blood sugar (glucose) after you have not eaten for a while (fasting). You may have this done every 1-3 years.  Abdominal aortic aneurysm (AAA) screening. You may need this if you are a current or former smoker.  Osteoporosis. You may be screened starting at age 31 if you are at high risk. Talk with your health care provider about your test results, treatment options, and if necessary, the need for more tests. Vaccines  Your health care provider may recommend certain vaccines, such as:  Influenza vaccine. This is recommended every year.  Tetanus, diphtheria, and acellular pertussis (Tdap, Td) vaccine. You may need a Td booster every 10 years.  Zoster vaccine. You may need this after age 52.  Pneumococcal 13-valent conjugate (PCV13) vaccine. One dose is recommended after age 4.  Pneumococcal polysaccharide (PPSV23) vaccine. One dose is recommended after age 65. Talk to your health care provider about which screenings and vaccines you need and how often you need them. This information is not intended to replace advice given to you by your health care provider. Make sure you discuss any questions you have with your health care provider. Document Released: 10/15/2015 Document Revised: 06/07/2016 Document Reviewed: 07/20/2015 Elsevier Interactive Patient Education  2017 [place] Prevention in the Home Falls can cause injuries. They can happen to people of all ages. There are many things you can do to make your home safe and to help prevent falls. What can I do on the outside of my home?  Regularly fix the edges of walkways and driveways and fix any cracks.  Remove anything that might make you trip as you walk through a door, such as a raised step or threshold.  Trim any bushes or trees on the path to your home.  Use bright outdoor lighting.  Clear any walking paths of anything that might make someone trip, such as rocks or  tools.  Regularly check to see if handrails are loose or broken. Make sure that both sides of any steps have handrails.  Any raised decks and porches should have guardrails on the edges.  Have any leaves, snow, or ice cleared regularly.  Use sand or salt on walking paths during winter.  Clean up any spills in your garage right away. This includes oil or grease spills. What can I do in the bathroom?  Use night lights.  Install grab bars by the toilet and in the tub and shower. Do not use towel bars as grab bars.  Use non-skid mats or decals in the tub or shower.  If you need to sit down in the shower, use a plastic, non-slip stool.  Keep the floor dry. Clean up any water that spills on the floor as soon as it happens.  Remove soap buildup in the tub or shower regularly.  Attach bath mats securely with double-sided non-slip rug tape.  Do not have throw rugs and other things on the floor that can make you trip. What can I do in the bedroom?  Use night lights.  Make sure that you have a light by your bed that is easy to reach.  Do not use any sheets or blankets that are too big for your bed. They should not hang down onto the floor.  Have a firm chair that has side arms. You can use this for  support while you get dressed.  Do not have throw rugs and other things on the floor that can make you trip. What can I do in the kitchen?  Clean up any spills right away.  Avoid walking on wet floors.  Keep items that you use a lot in easy-to-reach places.  If you need to reach something above you, use a strong step stool that has a grab bar.  Keep electrical cords out of the way.  Do not use floor polish or wax that makes floors slippery. If you must use wax, use non-skid floor wax.  Do not have throw rugs and other things on the floor that can make you trip. What can I do with my stairs?  Do not leave any items on the stairs.  Make sure that there are handrails on both  sides of the stairs and use them. Fix handrails that are broken or loose. Make sure that handrails are as long as the stairways.  Check any carpeting to make sure that it is firmly attached to the stairs. Fix any carpet that is loose or worn.  Avoid having throw rugs at the top or bottom of the stairs. If you do have throw rugs, attach them to the floor with carpet tape.  Make sure that you have a light switch at the top of the stairs and the bottom of the stairs. If you do not have them, ask someone to add them for you. What else can I do to help prevent falls?  Wear shoes that:  Do not have high heels.  Have rubber bottoms.  Are comfortable and fit you well.  Are closed at the toe. Do not wear sandals.  If you use a stepladder:  Make sure that it is fully opened. Do not climb a closed stepladder.  Make sure that both sides of the stepladder are locked into place.  Ask someone to hold it for you, if possible.  Clearly mark and make sure that you can see:  Any grab bars or handrails.  First and last steps.  Where the edge of each step is.  Use tools that help you move around (mobility aids) if they are needed. These include:  Canes.  Walkers.  Scooters.  Crutches.  Turn on the lights when you go into a dark area. Replace any light bulbs as soon as they burn out.  Set up your furniture so you have a clear path. Avoid moving your furniture around.  If any of your floors are uneven, fix them.  If there are any pets around you, be aware of where they are.  Review your medicines with your doctor. Some medicines can make you feel dizzy. This can increase your chance of falling. Ask your doctor what other things that you can do to help prevent falls. This information is not intended to replace advice given to you by your health care provider. Make sure you discuss any questions you have with your health care provider. Document Released: 07/15/2009 Document Revised:  02/24/2016 Document Reviewed: 10/23/2014 Elsevier Interactive Patient Education  2017 [employer].

## 2020-12-12 ENCOUNTER — Other Ambulatory Visit: Payer: Self-pay | Admitting: Family Medicine

## 2020-12-13 NOTE — Telephone Encounter (Signed)
Sent patient message about scheduling as last seen in 10/2019.

## 2020-12-14 NOTE — Telephone Encounter (Signed)
Patient scheduled later this month.

## 2020-12-15 NOTE — Progress Notes (Unsigned)
[facility] [place] Phone: [phone] Subjective:   Zachary Valdez, am serving as a scribe for Zachary Valdez. This visit occurred during the SARS-CoV-2 public health emergency.  Safety protocols were in place, including screening questions prior to the visit, additional usage of staff PPE, and extensive cleaning of exam room while observing appropriate contact time as indicated for disinfecting solutions.   I'm seeing this patient by the request  of:  Plotnikov, Evie Lacks, MD  CC: right knee pain   PPI:[id number]  Zachary Valdez is a 70 y.o. male coming in with complaint of right knee arthritis. Last seen in 2021. Is trying to get into see Zachary Valdez for R knee replacement.  Patient states that the tingling in his feet is significantly better when he is taking the gabapentin  States that he has numbness in feet in the mornings. Does feel gabapentin helps.  Patient states otherwise doing relatively well.     Past Medical History:  Diagnosis Date  . Allergic rhinitis   . Anxiety   . GERD (gastroesophageal reflux disease)   . Hyperlipidemia   . Hyperplastic colon polyp 2006  . Hypertension   . OA (osteoarthritis)   . Obesity   . Sleep apnea    cpap-   . Ulcerative esophagitis 2006   Dr. Fuller Plan  . Vitamin B 12 deficiency    Past Surgical History:  Procedure Laterality Date  . KNEE ARTHROSCOPY     Right  . TOTAL KNEE ARTHROPLASTY Left 09/07/2014   Procedure: LEFT TOTAL KNEE ARTHROPLASTY;  Surgeon: Zachary Alf, MD;  Location: [facility];  Service: Orthopedics;  Laterality: Left;   Social History   Socioeconomic History  . Marital status: Divorced    Spouse name: Not on file  . Number of children: Not on file  . Years of education: Not on file  . Highest education level: Not on file  Occupational History  . Occupation: retired  Tobacco Use  . Smoking status: Former Smoker    Types: Cigarettes    Quit  date: 10/02/1997    Years since quitting: 23.2  . Smokeless tobacco: Never Used  Vaping Use  . Vaping Use: Never used  Substance and Sexual Activity  . Alcohol use: Yes    Alcohol/week: 14.0 standard drinks    Types: 14 Glasses of wine per week  . Drug use: Valdez  . Sexual activity: Not Currently  Other Topics Concern  . Not on file  Social History Narrative   Daily caffeine- 2-3 per day   Social Determinants of Health   Financial Resource Strain: Low Risk   . Difficulty of Paying Living Expenses: Not hard at all  Food Insecurity: Valdez Food Insecurity  . Worried About Charity fundraiser in the Last Year: Never true  . Ran Out of Food in the Last Year: Never true  Transportation Needs: Valdez Transportation Needs  . Lack of Transportation (Medical): Valdez  . Lack of Transportation (Non-Medical): Valdez  Physical Activity: Inactive  . Days of Exercise per Week: 0 days  . Minutes of Exercise per Session: 0 min  Stress: Valdez Stress Concern Present  . Feeling of Stress : Not at all  Social Connections: Moderately Integrated  . Frequency of Communication with Friends and Family: More than three times a week  . Frequency of Social Gatherings with Friends and Family: More than three times a week  . Attends Religious Services:  1 to 4 times per year  . Active Member of Clubs or Organizations: Valdez  . Attends Archivist Meetings: 1 to 4 times per year  . Marital Status: Never married   Allergies  Allergen Reactions  . Atacand [Candesartan Cilexetil]   . Atorvastatin     REACTION: achy  . Enalapril     cough  . Hydrocodone-Acetaminophen     REACTION: SOB, patient states drug made patient feel weird   . Losartan   . Nexium [Esomeprazole]     diarrhea  . Tramadol     headache  . Wellbutrin [Bupropion]     Severe anxiety   Family History  Problem Relation Age of Onset  . Breast cancer Mother   . Hypertension Mother   . Hypertension Other   . Colon cancer Neg Hx     Current  Outpatient Medications (Endocrine & Metabolic):  .  metFORMIN (GLUCOPHAGE) 500 MG tablet, Take 1 tablet (500 mg total) by mouth daily with breakfast.  Current Outpatient Medications (Cardiovascular):  .  olmesartan (BENICAR) 40 MG tablet, TAKE ONE TABLET BY MOUTH DAILY .  rosuvastatin (CRESTOR) 40 MG tablet, TAKE ONE TABLET BY MOUTH DAILY .  tadalafil (CIALIS) 20 MG tablet, Take 1 tablet (20 mg total) by mouth daily as needed for erectile dysfunction.   Current Outpatient Medications (Analgesics):  .  aspirin EC 81 MG tablet, Take 1 tablet (81 mg total) by mouth daily. .  diclofenac (VOLTAREN) 50 MG EC tablet, Take 1 tablet (50 mg total) by mouth 2 (two) times daily as needed for moderate pain.  Current Outpatient Medications (Hematological):  .  vitamin B-12 (CYANOCOBALAMIN) 1000 MCG tablet, Take 1 tablet (1,000 mcg total) by mouth daily.  Current Outpatient Medications (Other):  .  doxycycline (VIBRA-TABS) 100 MG tablet, Take 1 tablet (100 mg total) by mouth 2 (two) times daily. Marland Kitchen  gabapentin (NEURONTIN) 100 MG capsule, Take 2 capsules (200 mg total) by mouth at bedtime. .  gabapentin (NEURONTIN) 100 MG capsule, Take 2 capsules (200 mg total) by mouth at bedtime. .  pantoprazole (PROTONIX) 40 MG tablet, Take 1 tablet (40 mg total) by mouth as needed. .  Vitamin D, Ergocalciferol, (DRISDOL) 1.25 MG (50000 UNIT) CAPS capsule, Take 1 capsule (50,000 Units total) by mouth every 7 (seven) days.   Reviewed prior external information including notes and imaging from  primary care provider As well as notes that were available from care everywhere and other healthcare systems.  Past medical history, social, surgical and family history all reviewed in electronic medical record.  Valdez pertanent information unless stated regarding to the chief complaint.   Review of Systems:  Valdez headache, visual changes, nausea, vomiting, diarrhea, constipation, dizziness, abdominal pain, skin rash, fevers,  chills, night sweats, weight loss, swollen lymph nodes, body aches, joint swelling, chest pain, shortness of breath, mood changes. POSITIVE muscle aches, numbness in feet  Objective  Blood pressure (!) 146/86, pulse 90, height 5\' 11"  (1.803 m), weight 276 lb (125.2 kg), SpO2 96 %.   General: Valdez apparent distress alert and oriented x3 mood and affect normal, dressed appropriately.  HEENT: Pupils equal, extraocular movements intact  Respiratory: Patient's speak in full sentences and does not appear short of breath  Cardiovascular: Valdez lower extremity edema, non tender, Valdez erythema  Gait antalgic gait MSK: Foot exam shows the patient does have breakdown of the transverse arch noted.  Patient does though seem to have some good sensation today.  Capillary refill noted.  Patient does feel pressure in the calf as well. Significant arthritic changes of the right knee noted.   Impression and Recommendations:     The above documentation has been reviewed and is accurate and complete Lyndal Pulley, DO

## 2020-12-16 ENCOUNTER — Other Ambulatory Visit: Payer: Self-pay

## 2020-12-16 ENCOUNTER — Encounter: Payer: Self-pay | Admitting: Family Medicine

## 2020-12-16 ENCOUNTER — Ambulatory Visit (INDEPENDENT_AMBULATORY_CARE_PROVIDER_SITE_OTHER): Payer: Medicare HMO | Admitting: Family Medicine

## 2020-12-16 VITALS — BP 146/86 | HR 90 | Ht 71.0 in | Wt 276.0 lb

## 2020-12-16 DIAGNOSIS — M2550 Pain in unspecified joint: Secondary | ICD-10-CM | POA: Diagnosis not present

## 2020-12-16 DIAGNOSIS — G609 Hereditary and idiopathic neuropathy, unspecified: Secondary | ICD-10-CM

## 2020-12-16 LAB — CBC WITH DIFFERENTIAL/PLATELET
Basophils Absolute: 0.1 10*3/uL (ref 0.0–0.1)
Basophils Relative: 1.1 % (ref 0.0–3.0)
Eosinophils Absolute: 0.2 10*3/uL (ref 0.0–0.7)
Eosinophils Relative: 3.2 % (ref 0.0–5.0)
HCT: 42.9 % (ref 39.0–52.0)
Hemoglobin: 14.4 g/dL (ref 13.0–17.0)
Lymphocytes Relative: 19.3 % (ref 12.0–46.0)
Lymphs Abs: 1.3 10*3/uL (ref 0.7–4.0)
MCHC: 33.6 g/dL (ref 30.0–36.0)
MCV: 106.1 fl — ABNORMAL HIGH (ref 78.0–100.0)
Monocytes Absolute: 0.8 10*3/uL (ref 0.1–1.0)
Monocytes Relative: 12.6 % — ABNORMAL HIGH (ref 3.0–12.0)
Neutro Abs: 4.2 10*3/uL (ref 1.4–7.7)
Neutrophils Relative %: 63.8 % (ref 43.0–77.0)
Platelets: 111 10*3/uL — ABNORMAL LOW (ref 150.0–400.0)
RBC: 4.04 Mil/uL — ABNORMAL LOW (ref 4.22–5.81)
RDW: 13.6 % (ref 11.5–15.5)
WBC: 6.5 10*3/uL (ref 4.0–10.5)

## 2020-12-16 LAB — COMPREHENSIVE METABOLIC PANEL
ALT: 50 U/L (ref 0–53)
AST: 61 U/L — ABNORMAL HIGH (ref 0–37)
Albumin: 3.8 g/dL (ref 3.5–5.2)
Alkaline Phosphatase: 70 U/L (ref 39–117)
BUN: 19 mg/dL (ref 6–23)
CO2: 27 mEq/L (ref 19–32)
Calcium: 9.1 mg/dL (ref 8.4–10.5)
Chloride: 103 mEq/L (ref 96–112)
Creatinine, Ser: 1.11 mg/dL (ref 0.40–1.50)
GFR: 68.54 mL/min (ref >60.00)
Glucose, Bld: 136 mg/dL — ABNORMAL HIGH (ref 70–99)
Potassium: 4.4 mEq/L (ref 3.5–5.1)
Sodium: 138 mEq/L (ref 135–145)
Total Bilirubin: 0.9 mg/dL (ref 0.2–1.2)
Total Protein: 7.5 g/dL (ref 6.0–8.3)

## 2020-12-16 LAB — URIC ACID: Uric Acid, Serum: 6.6 mg/dL (ref 4.0–7.8)

## 2020-12-16 LAB — HEMOGLOBIN A1C: Hgb A1c MFr Bld: 6.6 % — ABNORMAL HIGH (ref 4.6–6.5)

## 2020-12-16 LAB — VITAMIN D 25 HYDROXY (VIT D DEFICIENCY, FRACTURES): VITD: 69.61 ng/mL (ref 30.00–100.00)

## 2020-12-16 MED ORDER — GABAPENTIN 100 MG PO CAPS: 200.0000 mg | ORAL_CAPSULE | Freq: Every day | ORAL | 3 refills | Status: DC

## 2020-12-16 NOTE — Patient Instructions (Addendum)
Good to see you Gabapentin 200 mg at night Labs today Follow up with Alucio for the knee Here if you have questions See me again when you need me

## 2020-12-16 NOTE — Assessment & Plan Note (Signed)
Patient does have peripheral neuropathy and has responded very well to gabapentin over the course of the years.  Patient had not taken it regularly but started to now and has felt better.  Patient has not had any laboratory work-up and will get that for him but encouraged him to follow-up with primary care provider.  Will refill gabapentin at this time and will adjust if any labs are necessary.  Patient though as long as he does well can follow-up with me as needed with him likely undergoing a knee replacement in the near future.

## 2020-12-29 ENCOUNTER — Ambulatory Visit: Payer: Medicare HMO | Admitting: Family Medicine

## 2021-01-07 ENCOUNTER — Telehealth: Payer: Self-pay | Admitting: *Deleted

## 2021-01-07 NOTE — Telephone Encounter (Signed)
MD received medical clearance form from Dr. Maureen Ralphs. Pt will be having (R) Knee replacement. Called pt to make pre-op appt but there was no answer LMOM RT^C to schedule appt.Marland KitchenJohny Chess

## 2021-01-13 DIAGNOSIS — M1711 Unilateral primary osteoarthritis, right knee: Secondary | ICD-10-CM | POA: Diagnosis not present

## 2021-01-13 DIAGNOSIS — L430 Hypertrophic lichen planus: Secondary | ICD-10-CM | POA: Diagnosis not present

## 2021-02-09 DIAGNOSIS — G4733 Obstructive sleep apnea (adult) (pediatric): Secondary | ICD-10-CM | POA: Diagnosis not present

## 2021-02-11 DIAGNOSIS — L433 Subacute (active) lichen planus: Secondary | ICD-10-CM | POA: Diagnosis not present

## 2021-02-16 ENCOUNTER — Other Ambulatory Visit: Payer: Self-pay | Admitting: Internal Medicine

## 2021-02-16 ENCOUNTER — Other Ambulatory Visit: Payer: Self-pay

## 2021-02-17 ENCOUNTER — Encounter: Payer: Self-pay | Admitting: Internal Medicine

## 2021-02-17 ENCOUNTER — Other Ambulatory Visit: Payer: Self-pay

## 2021-02-17 ENCOUNTER — Ambulatory Visit (INDEPENDENT_AMBULATORY_CARE_PROVIDER_SITE_OTHER): Payer: Medicare HMO | Admitting: Internal Medicine

## 2021-02-17 ENCOUNTER — Ambulatory Visit (INDEPENDENT_AMBULATORY_CARE_PROVIDER_SITE_OTHER): Payer: Medicare HMO

## 2021-02-17 VITALS — BP 142/70 | HR 87 | Temp 98.7°F | Ht 71.0 in | Wt 274.8 lb

## 2021-02-17 DIAGNOSIS — I10 Essential (primary) hypertension: Secondary | ICD-10-CM | POA: Diagnosis not present

## 2021-02-17 DIAGNOSIS — I2583 Coronary atherosclerosis due to lipid rich plaque: Secondary | ICD-10-CM | POA: Diagnosis not present

## 2021-02-17 DIAGNOSIS — K760 Fatty (change of) liver, not elsewhere classified: Secondary | ICD-10-CM

## 2021-02-17 DIAGNOSIS — E785 Hyperlipidemia, unspecified: Secondary | ICD-10-CM | POA: Diagnosis not present

## 2021-02-17 DIAGNOSIS — L309 Dermatitis, unspecified: Secondary | ICD-10-CM | POA: Diagnosis not present

## 2021-02-17 DIAGNOSIS — E538 Deficiency of other specified B group vitamins: Secondary | ICD-10-CM

## 2021-02-17 DIAGNOSIS — Z01818 Encounter for other preprocedural examination: Secondary | ICD-10-CM | POA: Diagnosis not present

## 2021-02-17 DIAGNOSIS — I2510 Atherosclerotic heart disease of native coronary artery without angina pectoris: Secondary | ICD-10-CM

## 2021-02-17 DIAGNOSIS — M1711 Unilateral primary osteoarthritis, right knee: Secondary | ICD-10-CM

## 2021-02-17 NOTE — Assessment & Plan Note (Signed)
On B12 

## 2021-02-17 NOTE — Assessment & Plan Note (Signed)
The pain is worse.  Total knee arthroplasty scheduled by Dr. Wynelle Link

## 2021-02-17 NOTE — Assessment & Plan Note (Signed)
Cont w/crestor

## 2021-02-17 NOTE — Assessment & Plan Note (Addendum)
CT Ca score 551 1/20 Continue with Crestor.  No angina Hold ASA prior to surgery

## 2021-02-17 NOTE — Assessment & Plan Note (Addendum)
Better on Bethametasone cream Use Aquauphore

## 2021-02-17 NOTE — Assessment & Plan Note (Addendum)
Zachary Valdez is clear for surgery assuming his preop labs are acceptable.  EKG is normal.  Chest x-ray obtained.  Zachary Valdez will try to lose more weight.  His body mass index is 38 now. Thank you,

## 2021-02-17 NOTE — Progress Notes (Signed)
Subjective:  Patient ID: Zachary Valdez, male    DOB: 01/27/1951  Age: 70 y.o. MRN: [id number]  CC: Pre-op Exam ((R) Knee Surgery)   HPI [name] presents for pre-op eval  Reason R TKR med clearance for 03/14/21  Req by Dr Wynelle Link  Hx: We need to address HTN, CAD, DM - stable  Past Medical History:  Diagnosis Date  . Allergic rhinitis   . Anxiety   . GERD (gastroesophageal reflux disease)   . Hyperlipidemia   . Hyperplastic colon polyp 2006  . Hypertension   . OA (osteoarthritis)   . Obesity   . Sleep apnea    cpap-   . Ulcerative esophagitis 2006   Dr. Fuller Plan  . Vitamin B 12 deficiency    Past Surgical History:  Procedure Laterality Date  . KNEE ARTHROSCOPY     Right  . TOTAL KNEE ARTHROPLASTY Left 09/07/2014   Procedure: LEFT TOTAL KNEE ARTHROPLASTY;  Surgeon: Gearlean Alf, MD;  Location: [facility];  Service: Orthopedics;  Laterality: Left;    reports that he quit smoking about 23 years ago. His smoking use included cigarettes. He has never used smokeless tobacco. He reports current alcohol use of about 14.0 standard drinks of alcohol per week. He reports that he does not use drugs. family history includes Breast cancer in his mother; Hypertension in his mother and another family member. Allergies  Allergen Reactions  . Atacand [Candesartan Cilexetil]   . Atorvastatin     REACTION: achy  . Enalapril     cough  . Hydrocodone-Acetaminophen     REACTION: SOB, patient states drug made patient feel weird   . Losartan   . Nexium [Esomeprazole]     diarrhea  . Tramadol     headache  . Wellbutrin [Bupropion]     Severe anxiety     Outpatient Medications Prior to Visit  Medication Sig Dispense Refill  . aspirin EC 81 MG tablet Take 1 tablet (81 mg total) by mouth daily. 100 tablet 3  . diclofenac (VOLTAREN) 50 MG EC tablet Take 1 tablet (50 mg total) by mouth 2 (two) times daily as needed for moderate pain. 180 tablet 1  . gabapentin (NEURONTIN) 100 MG  capsule Take 2 capsules (200 mg total) by mouth at bedtime. 180 capsule 3  . metFORMIN (GLUCOPHAGE) 500 MG tablet Take 1 tablet (500 mg total) by mouth daily with breakfast. 90 tablet 3  . olmesartan (BENICAR) 40 MG tablet Take 1 tablet (40 mg total) by mouth daily. Annual appt due in Oct must see provider for future refills 90 tablet 1  . pantoprazole (PROTONIX) 40 MG tablet Take 1 tablet (40 mg total) by mouth as needed. 90 tablet 3  . rosuvastatin (CRESTOR) 40 MG tablet TAKE ONE TABLET BY MOUTH DAILY 90 tablet 1  . vitamin B-12 (CYANOCOBALAMIN) 1000 MCG tablet Take 1 tablet (1,000 mcg total) by mouth daily. 100 tablet 3  . tadalafil (CIALIS) 20 MG tablet Take 1 tablet (20 mg total) by mouth daily as needed for erectile dysfunction. 30 tablet 3  . doxycycline (VIBRA-TABS) 100 MG tablet Take 1 tablet (100 mg total) by mouth 2 (two) times daily. (Patient not taking: Reported on 02/17/2021) 20 tablet 0  . gabapentin (NEURONTIN) 100 MG capsule Take 2 capsules (200 mg total) by mouth at bedtime. 180 capsule 0  . Vitamin D, Ergocalciferol, (DRISDOL) 1.25 MG (50000 UNIT) CAPS capsule Take 1 capsule (50,000 Units total) by mouth every 7 (seven)  days. (Patient not taking: Reported on 02/17/2021) 4 capsule 0   No facility-administered medications prior to visit.    ROS: Review of Systems  Constitutional: Negative for appetite change, fatigue and unexpected weight change.  HENT: Negative for congestion, nosebleeds, sneezing, sore throat and trouble swallowing.   Eyes: Negative for itching and visual disturbance.  Respiratory: Negative for cough.   Cardiovascular: Negative for chest pain, palpitations and leg swelling.  Gastrointestinal: Negative for abdominal distention, blood in stool, diarrhea and nausea.  Genitourinary: Negative for frequency and hematuria.  Musculoskeletal: Positive for arthralgias and gait problem. Negative for back pain, joint swelling and neck pain.  Skin: Positive for rash.   Neurological: Negative for dizziness, tremors, speech difficulty and weakness.  Psychiatric/Behavioral: Negative for agitation, dysphoric mood and sleep disturbance. The patient is not nervous/anxious.     Objective:  BP (!) 142/70 (BP Location: Left Arm)   Pulse 87   Temp 98.7 F (37.1 C) (Oral)   Ht 5\' 11"  (1.803 m)   Wt 274 lb 12.8 oz (124.6 kg)   SpO2 97%   BMI 38.33 kg/m   BP Readings from Last 3 Encounters:  02/17/21 (!) 142/70  12/16/20 (!) 146/86  12/02/20 130/88    Wt Readings from Last 3 Encounters:  02/17/21 274 lb 12.8 oz (124.6 kg)  12/16/20 276 lb (125.2 kg)  12/02/20 268 lb 6.4 oz (121.7 kg)    Physical Exam Constitutional:      General: He is not in acute distress.    Appearance: He is well-developed. He is obese.     Comments: NAD  Eyes:     Conjunctiva/sclera: Conjunctivae normal.     Pupils: Pupils are equal, round, and reactive to light.  Neck:     Thyroid: No thyromegaly.     Vascular: No JVD.  Cardiovascular:     Rate and Rhythm: Normal rate and regular rhythm.     Heart sounds: Normal heart sounds. No murmur heard. No friction rub. No gallop.   Pulmonary:     Effort: Pulmonary effort is normal. No respiratory distress.     Breath sounds: Normal breath sounds. No wheezing or rales.  Chest:     Chest wall: No tenderness.  Abdominal:     General: Bowel sounds are normal. There is no distension.     Palpations: Abdomen is soft. There is no mass.     Tenderness: There is no abdominal tenderness. There is no guarding or rebound.  Musculoskeletal:        General: No tenderness. Normal range of motion.     Cervical back: Normal range of motion.  Lymphadenopathy:     Cervical: No cervical adenopathy.  Skin:    General: Skin is warm and dry.     Findings: Rash present.  Neurological:     Mental Status: He is alert and oriented to person, place, and time.     Cranial Nerves: No cranial nerve deficit.     Motor: No abnormal muscle tone.      Coordination: Coordination normal.     Gait: Gait abnormal.     Deep Tendon Reflexes: Reflexes are normal and symmetric.  Psychiatric:        Behavior: Behavior normal.        Thought Content: Thought content normal.        Judgment: Judgment normal.    He is limping.  Right knee is painful  Procedure: EKG Indication: chest pain Impression: NSR. No acute changes.  Lab  Results  Component Value Date   WBC 6.5 12/16/2020   HGB 14.4 12/16/2020   HCT 42.9 12/16/2020   PLT 111.0 (L) 12/16/2020   GLUCOSE 136 (H) 12/16/2020   CHOL 206 (H) 09/23/2019   TRIG 149 09/23/2019   HDL 61 09/23/2019   LDLDIRECT 146.2 10/07/2010   LDLCALC 119 (H) 09/23/2019   ALT 50 12/16/2020   AST 61 (H) 12/16/2020   NA 138 12/16/2020   K 4.4 12/16/2020   CL 103 12/16/2020   CREATININE 1.11 12/16/2020   BUN 19 12/16/2020   CO2 27 12/16/2020   TSH 3.320 05/22/2019   PSA 0.59 03/29/2018   INR 1.02 08/31/2014   HGBA1C 6.6 (H) 12/16/2020    DG Knee Complete 4 Views Right  Result Date: 12/04/2018 CLINICAL DATA:  Chronic right knee pain for 5 years. No known injury. EXAM: RIGHT KNEE - COMPLETE 4+ VIEW COMPARISON:  None. FINDINGS: Tricompartment degenerative changes with joint space narrowing and spurring, most pronounced in the medial and patellofemoral compartments. No significant joint effusion. No acute bony abnormality. Specifically, no fracture, subluxation, or dislocation. Vascular calcifications noted. IMPRESSION: Moderate tricompartment degenerative changes, most pronounced in the medial and patellofemoral compartments. No acute bony abnormality. Electronically Signed   By: Rolm Baptise M.D.   On: 12/04/2018 08:42    Assessment & Plan:     No orders of the defined types were placed in this encounter.    Follow-up: No follow-ups on file.  Walker Kehr, MD

## 2021-02-17 NOTE — Assessment & Plan Note (Signed)
On Edarbi 

## 2021-02-17 NOTE — Assessment & Plan Note (Signed)
Liver function test with chronic mild elevation of liver enzymes.  Risk and continues to persuade weight loss.  Low-carb diet he will reduce his alcohol intake

## 2021-02-19 ENCOUNTER — Encounter: Payer: Self-pay | Admitting: Internal Medicine

## 2021-02-19 DIAGNOSIS — I700 Atherosclerosis of aorta: Secondary | ICD-10-CM | POA: Insufficient documentation

## 2021-03-02 DIAGNOSIS — M1711 Unilateral primary osteoarthritis, right knee: Secondary | ICD-10-CM | POA: Diagnosis not present

## 2021-03-02 DIAGNOSIS — M25661 Stiffness of right knee, not elsewhere classified: Secondary | ICD-10-CM | POA: Diagnosis not present

## 2021-03-02 DIAGNOSIS — M25561 Pain in right knee: Secondary | ICD-10-CM | POA: Diagnosis not present

## 2021-03-03 NOTE — Patient Instructions (Addendum)
DUE TO COVID-19 ONLY ONE VISITOR IS ALLOWED TO COME WITH YOU AND STAY IN THE WAITING ROOM ONLY DURING PRE OP AND PROCEDURE DAY OF SURGERY. THE 2 VISITORS   MAY VISIT WITH YOU AFTER SURGERY IN YOUR PRIVATE ROOM DURING VISITING HOURS ONLY!  YOU NEED TO HAVE A COVID 19 TEST ON_6/9/22______ @__1 :00 pm_____, THIS TEST MUST BE DONE BEFORE SURGERY,  COVID TESTING SITE [place] [address], IT IS ON THE RIGHT GOING OUT [address] APPROXIMATELY  2 MINUTES PAST [employer] ON THE RIGHT. ONCE YOUR COVID TEST IS COMPLETED,  PLEASE BEGIN THE QUARANTINE INSTRUCTIONS AS OUTLINED IN YOUR HANDOUT.                [name]   Your procedure is scheduled on: 03/14/21   Report to [facility] Main  Entrance   Report to admitting at 11:15 AM     Call this number if you have problems the morning of surgery [phone]   . BRUSH YOUR TEETH MORNING OF SURGERY AND RINSE YOUR MOUTH OUT, NO CHEWING GUM CANDY OR MINTS.   No food after midnight  .  You may have clear liquid until 10:30 AM.    CLEAR LIQUID DIET   Foods Allowed                                                                     Foods Excluded  Coffee and tea, regular and decaf                             liquids that you cannot  Plain Jell-O any favor except red or purple                                           see through such as: Fruit ices (not with fruit pulp)                                     milk, soups, orange juice  Iced Popsicles                                    All solid food Carbonated beverages, regular and diet                                    Cranberry, grape and apple juices Sports drinks like Gatorade Lightly seasoned clear broth or consume(fat free) Sugar, honey syrup    At 10:00 AM drink pre surgery drink.   Nothing by mouth after 10:30 AM.   Take these medicines the morning of surgery with A SIP OF WATER: ,  Bring mask and tubing  DO NOT TAKE ANY DIABETIC  MEDICATIONS DAY OF YOUR SURGERY  You may not have any metal on your body including              piercings  Do not wear jewelry, lotions, powders or deodorant              Men may shave face and neck.   Do not bring valuables to the hospital. [place].  Contacts, dentures or bridgework may not be worn into surgery.    :  Special Instructions: N/A              Please read over the following fact sheets you were given: _____________________________________________________________________             [facility] - Preparing for Surgery Before surgery, you can play an important role.  Because skin is not sterile, your skin needs to be as free of germs as possible.  You can reduce the number of germs on your skin by washing with CHG (chlorahexidine gluconate) soap before surgery.  CHG is an antiseptic cleaner which kills germs and bonds with the skin to continue killing germs even after washing. Please DO NOT use if you have an allergy to CHG or antibacterial soaps.  If your skin becomes reddened/irritated stop using the CHG and inform your nurse when you arrive at Short Stay. .  You may shave your face/neck.  Please follow these instructions carefully:  1.  Shower with CHG Soap the night before surgery and the  morning of Surgery.  2.  If you choose to wash your hair, wash your hair first as usual with your  normal  shampoo.  3.  After you shampoo, rinse your hair and body thoroughly to remove the  shampoo.                                        4.  Use CHG as you would any other liquid soap.  You can apply chg directly  to the skin and wash                       Gently with a scrungie or clean washcloth.  5.  Apply the CHG Soap to your body ONLY FROM THE NECK DOWN.   Do not use on face/ open                           Wound or open sores. Avoid contact with eyes, ears mouth and genitals (private parts).                        Wash face,  Genitals (private parts) with your normal soap.             6.  Wash thoroughly, paying special attention to the area where your surgery  will be performed.  7.  Thoroughly rinse your body with warm water from the neck down.  8.  DO NOT shower/wash with your normal soap after using and rinsing off  the CHG Soap.             9.  Pat yourself dry with a clean towel.            10.  Wear clean pajamas.  11.  Place clean sheets on your bed the night of your first shower and do not  sleep with pets. Day of Surgery : Do not apply any lotions/deodorants the morning of surgery.  Please wear clean clothes to the hospital/surgery center.  FAILURE TO FOLLOW THESE INSTRUCTIONS MAY RESULT IN THE CANCELLATION OF YOUR SURGERY PATIENT SIGNATURE_________________________________  NURSE SIGNATURE__________________________________  ________________________________________________________________________   Adam Phenix  An incentive spirometer is a tool that can help keep your lungs clear and active. This tool measures how well you are filling your lungs with each breath. Taking long deep breaths may help reverse or decrease the chance of developing breathing (pulmonary) problems (especially infection) following:  A long period of time when you are unable to move or be active. BEFORE THE PROCEDURE   If the spirometer includes an indicator to show your best effort, your nurse or respiratory therapist will set it to a desired goal.  If possible, sit up straight or lean slightly forward. Try not to slouch.  Hold the incentive spirometer in an upright position. INSTRUCTIONS FOR USE  1. Sit on the edge of your bed if possible, or sit up as far as you can in bed or on a chair. 2. Hold the incentive spirometer in an upright position. 3. Breathe out normally. 4. Place the mouthpiece in your mouth and seal your lips tightly around it. 5. Breathe in slowly and as  deeply as possible, raising the piston or the ball toward the top of the column. 6. Hold your breath for 3-5 seconds or for as long as possible. Allow the piston or ball to fall to the bottom of the column. 7. Remove the mouthpiece from your mouth and breathe out normally. 8. Rest for a few seconds and repeat Steps 1 through 7 at least 10 times every 1-2 hours when you are awake. Take your time and take a few normal breaths between deep breaths. 9. The spirometer may include an indicator to show your best effort. Use the indicator as a goal to work toward during each repetition. 10. After each set of 10 deep breaths, practice coughing to be sure your lungs are clear. If you have an incision (the cut made at the time of surgery), support your incision when coughing by placing a pillow or rolled up towels firmly against it. Once you are able to get out of bed, walk around indoors and cough well. You may stop using the incentive spirometer when instructed by your caregiver.  RISKS AND COMPLICATIONS  Take your time so you do not get dizzy or light-headed.  If you are in pain, you may need to take or ask for pain medication before doing incentive spirometry. It is harder to take a deep breath if you are having pain. AFTER USE  Rest and breathe slowly and easily.  It can be helpful to keep track of a log of your progress. Your caregiver can provide you with a simple table to help with this. If you are using the spirometer at home, follow these instructions: [place] IF:   You are having difficultly using the spirometer.  You have trouble using the spirometer as often as instructed.  Your pain medication is not giving enough relief while using the spirometer.  You develop fever of 100.5 F (38.1 C) or higher. SEEK IMMEDIATE MEDICAL CARE IF:   You cough up bloody sputum that had not been present before.  You develop fever of 102 F (38.9 C) or  greater.  You develop worsening pain  at or near the incision site. MAKE SURE YOU:   Understand these instructions.  Will watch your condition.  Will get help right away if you are not doing well or get worse. Document Released: 01/29/2007 Document Revised: 12/11/2011 Document Reviewed: 04/01/2007 [facility] [employer] Patient Information 2014 [place], [place].   ________________________________________________________________________

## 2021-03-04 ENCOUNTER — Other Ambulatory Visit: Payer: Self-pay

## 2021-03-04 ENCOUNTER — Encounter (HOSPITAL_COMMUNITY): Payer: Self-pay

## 2021-03-04 ENCOUNTER — Encounter (HOSPITAL_COMMUNITY)
Admission: RE | Admit: 2021-03-04 | Discharge: 2021-03-04 | Disposition: A | Discharge status: Home / Self Care | Payer: Medicare HMO | Source: Ambulatory Visit | Attending: Orthopedic Surgery | Admitting: Orthopedic Surgery

## 2021-03-04 DIAGNOSIS — Z01812 Encounter for preprocedural laboratory examination: Secondary | ICD-10-CM | POA: Diagnosis not present

## 2021-03-04 HISTORY — DX: Type 2 diabetes mellitus without complications: E11.9

## 2021-03-04 LAB — COMPREHENSIVE METABOLIC PANEL
ALT: 40 U/L (ref 0–44)
AST: 53 U/L — ABNORMAL HIGH (ref 15–41)
Albumin: 3.7 g/dL (ref 3.5–5.0)
Alkaline Phosphatase: 64 U/L (ref 38–126)
Anion gap: 8 (ref 5–15)
BUN: 17 mg/dL (ref 8–23)
CO2: 24 mmol/L (ref 22–32)
Calcium: 9.4 mg/dL (ref 8.9–10.3)
Chloride: 107 mmol/L (ref 98–111)
Creatinine, Ser: 0.88 mg/dL (ref 0.61–1.24)
GFR, Estimated: >60 mL/min (ref >60)
Glucose, Bld: 132 mg/dL — ABNORMAL HIGH (ref 70–99)
Potassium: 4.3 mmol/L (ref 3.5–5.1)
Sodium: 139 mmol/L (ref 135–145)
Total Bilirubin: 1.2 mg/dL (ref 0.3–1.2)
Total Protein: 7.8 g/dL (ref 6.5–8.1)

## 2021-03-04 LAB — CBC
HCT: 48.6 % (ref 39.0–52.0)
Hemoglobin: 15.6 g/dL (ref 13.0–17.0)
MCH: 34.5 pg — ABNORMAL HIGH (ref 26.0–34.0)
MCHC: 32.1 g/dL (ref 30.0–36.0)
MCV: 107.5 fL — ABNORMAL HIGH (ref 80.0–100.0)
Platelets: 117 10*3/uL — ABNORMAL LOW (ref 150–400)
RBC: 4.52 MIL/uL (ref 4.22–5.81)
RDW: 12.5 % (ref 11.5–15.5)
WBC: 7.2 10*3/uL (ref 4.0–10.5)
nRBC: 0 % (ref 0.0–0.2)

## 2021-03-04 LAB — GLUCOSE, CAPILLARY: Glucose-Capillary: 156 mg/dL — ABNORMAL HIGH (ref 70–99)

## 2021-03-04 LAB — PROTIME-INR
INR: 1.1 (ref 0.8–1.2)
Prothrombin Time: 14.4 seconds (ref 11.4–15.2)

## 2021-03-04 LAB — SURGICAL PCR SCREEN
MRSA, PCR: NEGATIVE
Staphylococcus aureus: NEGATIVE

## 2021-03-04 NOTE — Progress Notes (Signed)
COVID Vaccine Completed:Yes Date COVID Vaccine completed:2.2021 COVID vaccine manufacturer: Pfizer     PCP - Dr. Alain Marion Cardiologist - no  Chest x-ray - 02/18/21-epic EKG - 02/17/21-epic, chart Stress Test - no ECHO - no Cardiac Cath - no Pacemaker/ICD device last checked:NA  Sleep Study - yes CPAP - yes  Fasting Blood Sugar - Pt doesn't test. He hasn't been Dx with diabetes but has gained Wt and is on Metformin for Wt loss. CBG was 156 at PAT and A1c was done Checks Blood Sugar _____ times a day  Blood Thinner Instructions:NA Aspirin Instructions: Last Dose:  Anesthesia review:   Patient denies shortness of breath, fever, cough and chest pain at PAT appointment Yes. Pt get winded on 1 flight of stairs. But not with ADLs.  He has a dermatitis on the front of his legs that Dr. Wynelle Link has seen. It isn't open at this time . Pt says that it comes and goes.  Patient verbalized understanding of instructions that were given to them at the PAT appointment. Patient was also instructed that they will need to review over the PAT instructions again at home before surgery.yes

## 2021-03-05 LAB — HEMOGLOBIN A1C
Hgb A1c MFr Bld: 7.2 % — ABNORMAL HIGH (ref 4.8–5.6)
Mean Plasma Glucose: 160 mg/dL

## 2021-03-10 ENCOUNTER — Other Ambulatory Visit (HOSPITAL_COMMUNITY)
Admission: RE | Admit: 2021-03-10 | Discharge: 2021-03-10 | Disposition: A | Discharge status: Home / Self Care | Payer: Medicare HMO | Source: Ambulatory Visit | Attending: Orthopedic Surgery | Admitting: Orthopedic Surgery

## 2021-03-10 DIAGNOSIS — Z01812 Encounter for preprocedural laboratory examination: Secondary | ICD-10-CM | POA: Diagnosis not present

## 2021-03-10 DIAGNOSIS — Z20822 Contact with and (suspected) exposure to covid-19: Secondary | ICD-10-CM | POA: Diagnosis not present

## 2021-03-10 LAB — SARS CORONAVIRUS 2 (TAT 6-24 HRS): SARS Coronavirus 2: NEGATIVE

## 2021-03-13 MED ORDER — CEFAZOLIN IN SODIUM CHLORIDE 3-0.9 GM/100ML-% IV SOLN
3.0000 g | INTRAVENOUS | Status: AC
  Administered 2021-03-14: 3 g via INTRAVENOUS
  Filled 2021-03-13: qty 100

## 2021-03-13 MED ORDER — BUPIVACAINE LIPOSOME 1.3 % IJ SUSP
20.0000 mL | Freq: Once | INTRAMUSCULAR | Status: DC
  Filled 2021-03-13: qty 20

## 2021-03-14 ENCOUNTER — Observation Stay (HOSPITAL_COMMUNITY)
Admission: RE | Admit: 2021-03-14 | Discharge: 2021-03-15 | Disposition: A | Discharge status: Home / Self Care | Payer: Medicare HMO | Attending: Orthopedic Surgery | Admitting: Orthopedic Surgery

## 2021-03-14 ENCOUNTER — Encounter (HOSPITAL_COMMUNITY): Payer: Self-pay | Admitting: Orthopedic Surgery

## 2021-03-14 ENCOUNTER — Ambulatory Visit (HOSPITAL_COMMUNITY): Payer: Medicare HMO | Admitting: Certified Registered Nurse Anesthetist

## 2021-03-14 ENCOUNTER — Other Ambulatory Visit: Payer: Self-pay

## 2021-03-14 ENCOUNTER — Encounter (HOSPITAL_COMMUNITY)
Admission: RE | Disposition: A | Discharge status: Home / Self Care | Payer: Self-pay | Source: Home / Self Care | Attending: Orthopedic Surgery

## 2021-03-14 DIAGNOSIS — Z96652 Presence of left artificial knee joint: Secondary | ICD-10-CM | POA: Insufficient documentation

## 2021-03-14 DIAGNOSIS — Z7984 Long term (current) use of oral hypoglycemic drugs: Secondary | ICD-10-CM | POA: Diagnosis not present

## 2021-03-14 DIAGNOSIS — Z79899 Other long term (current) drug therapy: Secondary | ICD-10-CM | POA: Insufficient documentation

## 2021-03-14 DIAGNOSIS — I10 Essential (primary) hypertension: Secondary | ICD-10-CM | POA: Insufficient documentation

## 2021-03-14 DIAGNOSIS — G8918 Other acute postprocedural pain: Secondary | ICD-10-CM | POA: Diagnosis not present

## 2021-03-14 DIAGNOSIS — M1711 Unilateral primary osteoarthritis, right knee: Principal | ICD-10-CM | POA: Diagnosis present

## 2021-03-14 DIAGNOSIS — E785 Hyperlipidemia, unspecified: Secondary | ICD-10-CM | POA: Diagnosis not present

## 2021-03-14 DIAGNOSIS — Z87891 Personal history of nicotine dependence: Secondary | ICD-10-CM | POA: Insufficient documentation

## 2021-03-14 DIAGNOSIS — E119 Type 2 diabetes mellitus without complications: Secondary | ICD-10-CM | POA: Diagnosis not present

## 2021-03-14 DIAGNOSIS — I2510 Atherosclerotic heart disease of native coronary artery without angina pectoris: Secondary | ICD-10-CM | POA: Diagnosis not present

## 2021-03-14 HISTORY — PX: TOTAL KNEE ARTHROPLASTY: SHX125

## 2021-03-14 LAB — GLUCOSE, CAPILLARY
Glucose-Capillary: 121 mg/dL — ABNORMAL HIGH (ref 70–99)
Glucose-Capillary: 317 mg/dL — ABNORMAL HIGH (ref 70–99)

## 2021-03-14 SURGERY — ARTHROPLASTY, KNEE, TOTAL
Anesthesia: Spinal | Site: Knee | Laterality: Right

## 2021-03-14 MED ORDER — FENTANYL CITRATE (PF) 100 MCG/2ML IJ SOLN: 50.0000 ug | Freq: Once | INTRAMUSCULAR | Status: AC

## 2021-03-14 MED ORDER — OLMESARTAN MEDOXOMIL 40 MG PO TABS
40.0000 mg | ORAL_TABLET | Freq: Every day | ORAL | Status: DC
  Administered 2021-03-15: 40 mg via ORAL
  Filled 2021-03-14: qty 1

## 2021-03-14 MED ORDER — GABAPENTIN 300 MG PO CAPS
300.0000 mg | ORAL_CAPSULE | Freq: Three times a day (TID) | ORAL | Status: DC
  Administered 2021-03-14 – 2021-03-15 (×3): 300 mg via ORAL
  Filled 2021-03-14 (×3): qty 1

## 2021-03-14 MED ORDER — ACETAMINOPHEN 10 MG/ML IV SOLN
1000.0000 mg | Freq: Four times a day (QID) | INTRAVENOUS | Status: DC
  Administered 2021-03-14: 1000 mg via INTRAVENOUS
  Filled 2021-03-14: qty 100

## 2021-03-14 MED ORDER — ASPIRIN EC 325 MG PO TBEC
325.0000 mg | DELAYED_RELEASE_TABLET | Freq: Two times a day (BID) | ORAL | Status: DC
  Administered 2021-03-15: 325 mg via ORAL
  Filled 2021-03-14: qty 1

## 2021-03-14 MED ORDER — CHLORHEXIDINE GLUCONATE 0.12 % MT SOLN
15.0000 mL | Freq: Once | OROMUCOSAL | Status: AC
  Administered 2021-03-14: 15 mL via OROMUCOSAL

## 2021-03-14 MED ORDER — MENTHOL 3 MG MT LOZG: 1.0000 | LOZENGE | OROMUCOSAL | Status: DC | PRN

## 2021-03-14 MED ORDER — INSULIN ASPART 100 UNIT/ML IJ SOLN
0.0000 [IU] | Freq: Three times a day (TID) | INTRAMUSCULAR | Status: DC
  Administered 2021-03-15: 5 [IU] via SUBCUTANEOUS
  Administered 2021-03-15: 3 [IU] via SUBCUTANEOUS

## 2021-03-14 MED ORDER — LACTATED RINGERS IV SOLN: INTRAVENOUS | Status: DC

## 2021-03-14 MED ORDER — METHOCARBAMOL 500 MG IVPB - SIMPLE MED
500.0000 mg | Freq: Four times a day (QID) | INTRAVENOUS | Status: DC | PRN
  Filled 2021-03-14: qty 50

## 2021-03-14 MED ORDER — ONDANSETRON HCL 4 MG/2ML IJ SOLN: 4.0000 mg | Freq: Four times a day (QID) | INTRAMUSCULAR | Status: DC | PRN

## 2021-03-14 MED ORDER — ONDANSETRON HCL 4 MG/2ML IJ SOLN
INTRAMUSCULAR | Status: AC
  Filled 2021-03-14: qty 2

## 2021-03-14 MED ORDER — FENTANYL CITRATE (PF) 100 MCG/2ML IJ SOLN: 25.0000 ug | INTRAMUSCULAR | Status: DC | PRN

## 2021-03-14 MED ORDER — FENTANYL CITRATE (PF) 100 MCG/2ML IJ SOLN
INTRAMUSCULAR | Status: AC
  Administered 2021-03-14: 50 ug via INTRAVENOUS
  Filled 2021-03-14: qty 2

## 2021-03-14 MED ORDER — FLEET ENEMA 7-19 GM/118ML RE ENEM: 1.0000 | ENEMA | Freq: Once | RECTAL | Status: DC | PRN

## 2021-03-14 MED ORDER — POLYETHYLENE GLYCOL 3350 17 G PO PACK: 17.0000 g | PACK | Freq: Every day | ORAL | Status: DC | PRN

## 2021-03-14 MED ORDER — MIDAZOLAM HCL 2 MG/2ML IJ SOLN: 1.0000 mg | Freq: Once | INTRAMUSCULAR | Status: AC

## 2021-03-14 MED ORDER — DOCUSATE SODIUM 100 MG PO CAPS
100.0000 mg | ORAL_CAPSULE | Freq: Two times a day (BID) | ORAL | Status: DC
  Administered 2021-03-14 – 2021-03-15 (×2): 100 mg via ORAL
  Filled 2021-03-14 (×2): qty 1

## 2021-03-14 MED ORDER — OXYCODONE HCL 5 MG/5ML PO SOLN: 5.0000 mg | Freq: Once | ORAL | Status: DC | PRN

## 2021-03-14 MED ORDER — METOCLOPRAMIDE HCL 5 MG PO TABS: 5.0000 mg | ORAL_TABLET | Freq: Three times a day (TID) | ORAL | Status: DC | PRN

## 2021-03-14 MED ORDER — PANTOPRAZOLE SODIUM 40 MG PO TBEC: 40.0000 mg | DELAYED_RELEASE_TABLET | ORAL | Status: DC | PRN

## 2021-03-14 MED ORDER — ROPIVACAINE HCL 5 MG/ML IJ SOLN
INTRAMUSCULAR | Status: DC | PRN
  Administered 2021-03-14: 30 mL via PERINEURAL

## 2021-03-14 MED ORDER — DIPHENHYDRAMINE HCL 12.5 MG/5ML PO ELIX: 12.5000 mg | ORAL_SOLUTION | ORAL | Status: DC | PRN

## 2021-03-14 MED ORDER — BUPIVACAINE IN DEXTROSE 0.75-8.25 % IT SOLN
INTRATHECAL | Status: DC | PRN
  Administered 2021-03-14: 2 mL via INTRATHECAL

## 2021-03-14 MED ORDER — ONDANSETRON HCL 4 MG/2ML IJ SOLN: 4.0000 mg | Freq: Once | INTRAMUSCULAR | Status: DC | PRN

## 2021-03-14 MED ORDER — TRANEXAMIC ACID-NACL 1000-0.7 MG/100ML-% IV SOLN
1000.0000 mg | INTRAVENOUS | Status: AC
  Administered 2021-03-14: 1000 mg via INTRAVENOUS
  Filled 2021-03-14: qty 100

## 2021-03-14 MED ORDER — MIDAZOLAM HCL 2 MG/2ML IJ SOLN
INTRAMUSCULAR | Status: AC
  Administered 2021-03-14: 2 mg via INTRAVENOUS
  Filled 2021-03-14: qty 2

## 2021-03-14 MED ORDER — HYDROMORPHONE HCL 2 MG PO TABS
4.0000 mg | ORAL_TABLET | ORAL | Status: DC | PRN
Start: 2021-03-14 — End: 2021-03-15
  Administered 2021-03-15 (×2): 4 mg via ORAL
  Filled 2021-03-14 (×2): qty 2

## 2021-03-14 MED ORDER — HYDROMORPHONE HCL 1 MG/ML IJ SOLN: 0.5000 mg | INTRAMUSCULAR | Status: DC | PRN

## 2021-03-14 MED ORDER — ORAL CARE MOUTH RINSE: 15.0000 mL | Freq: Once | OROMUCOSAL | Status: AC

## 2021-03-14 MED ORDER — SODIUM CHLORIDE 0.9 % IV SOLN: INTRAVENOUS | Status: DC

## 2021-03-14 MED ORDER — HYDROMORPHONE HCL 2 MG PO TABS
2.0000 mg | ORAL_TABLET | ORAL | Status: DC | PRN
  Administered 2021-03-14 – 2021-03-15 (×2): 2 mg via ORAL
  Filled 2021-03-14 (×2): qty 1

## 2021-03-14 MED ORDER — ONDANSETRON HCL 4 MG/2ML IJ SOLN
INTRAMUSCULAR | Status: DC | PRN
  Administered 2021-03-14: 4 mg via INTRAVENOUS

## 2021-03-14 MED ORDER — INSULIN ASPART 100 UNIT/ML IJ SOLN
0.0000 [IU] | Freq: Every day | INTRAMUSCULAR | Status: DC
  Administered 2021-03-14: 4 [IU] via SUBCUTANEOUS

## 2021-03-14 MED ORDER — ONDANSETRON HCL 4 MG PO TABS: 4.0000 mg | ORAL_TABLET | Freq: Four times a day (QID) | ORAL | Status: DC | PRN

## 2021-03-14 MED ORDER — METHOCARBAMOL 500 MG PO TABS
500.0000 mg | ORAL_TABLET | Freq: Four times a day (QID) | ORAL | Status: DC | PRN
  Administered 2021-03-14 – 2021-03-15 (×2): 500 mg via ORAL
  Filled 2021-03-14 (×2): qty 1

## 2021-03-14 MED ORDER — BUPIVACAINE LIPOSOME 1.3 % IJ SUSP
INTRAMUSCULAR | Status: DC | PRN
  Administered 2021-03-14: 20 mL

## 2021-03-14 MED ORDER — PHENOL 1.4 % MT LIQD: 1.0000 | OROMUCOSAL | Status: DC | PRN

## 2021-03-14 MED ORDER — AMISULPRIDE (ANTIEMETIC) 5 MG/2ML IV SOLN
10.0000 mg | Freq: Once | INTRAVENOUS | Status: DC | PRN
Start: 2021-03-14 — End: 2021-03-14

## 2021-03-14 MED ORDER — OXYCODONE HCL 5 MG PO TABS: 5.0000 mg | ORAL_TABLET | Freq: Once | ORAL | Status: DC | PRN

## 2021-03-14 MED ORDER — DEXAMETHASONE SODIUM PHOSPHATE 10 MG/ML IJ SOLN
INTRAMUSCULAR | Status: AC
  Filled 2021-03-14: qty 1

## 2021-03-14 MED ORDER — POVIDONE-IODINE 10 % EX SWAB
2.0000 "application " | Freq: Once | CUTANEOUS | Status: AC
  Administered 2021-03-14: 2 via TOPICAL

## 2021-03-14 MED ORDER — PROPOFOL 1000 MG/100ML IV EMUL
INTRAVENOUS | Status: AC
  Filled 2021-03-14: qty 100

## 2021-03-14 MED ORDER — PROPOFOL 10 MG/ML IV BOLUS
INTRAVENOUS | Status: DC | PRN
  Administered 2021-03-14 (×2): 20 mg via INTRAVENOUS

## 2021-03-14 MED ORDER — DEXAMETHASONE SODIUM PHOSPHATE 10 MG/ML IJ SOLN
8.0000 mg | Freq: Once | INTRAMUSCULAR | Status: AC
  Administered 2021-03-14: 8 mg via INTRAVENOUS

## 2021-03-14 MED ORDER — MEPIVACAINE HCL (PF) 2 % IJ SOLN
INTRAMUSCULAR | Status: AC
  Filled 2021-03-14: qty 20

## 2021-03-14 MED ORDER — PROPOFOL 500 MG/50ML IV EMUL
INTRAVENOUS | Status: DC | PRN
  Administered 2021-03-14: 75 ug/kg/min via INTRAVENOUS

## 2021-03-14 MED ORDER — BISACODYL 10 MG RE SUPP: 10.0000 mg | Freq: Every day | RECTAL | Status: DC | PRN

## 2021-03-14 MED ORDER — SODIUM CHLORIDE (PF) 0.9 % IJ SOLN
INTRAMUSCULAR | Status: DC | PRN
  Administered 2021-03-14: 60 mL

## 2021-03-14 MED ORDER — SODIUM CHLORIDE 0.9 % IR SOLN
Status: DC | PRN
  Administered 2021-03-14 (×2): 1000 mL

## 2021-03-14 MED ORDER — CEFAZOLIN SODIUM-DEXTROSE 2-4 GM/100ML-% IV SOLN
2.0000 g | Freq: Four times a day (QID) | INTRAVENOUS | Status: AC
  Administered 2021-03-14 – 2021-03-15 (×2): 2 g via INTRAVENOUS
  Filled 2021-03-14 (×2): qty 100

## 2021-03-14 MED ORDER — METOCLOPRAMIDE HCL 5 MG/ML IJ SOLN: 5.0000 mg | Freq: Three times a day (TID) | INTRAMUSCULAR | Status: DC | PRN

## 2021-03-14 MED ORDER — ACETAMINOPHEN 500 MG PO TABS
1000.0000 mg | ORAL_TABLET | Freq: Four times a day (QID) | ORAL | Status: AC
  Administered 2021-03-14 – 2021-03-15 (×4): 1000 mg via ORAL
  Filled 2021-03-14 (×4): qty 2

## 2021-03-14 SURGICAL SUPPLY — 53 items
ATTUNE MED DOME PAT 41 KNEE (Knees) ×2 IMPLANT
ATTUNE PS FEM RT SZ 8 CEM KNEE (Femur) ×2 IMPLANT
ATTUNE PSRP INSR SZ8 10 KNEE (Insert) ×2 IMPLANT
BAG DECANTER FOR FLEXI CONT (MISCELLANEOUS) ×2 IMPLANT
BAG ZIPLOCK 12X15 (MISCELLANEOUS) ×2 IMPLANT
BASE TIBIAL ROT PLAT SZ 8 KNEE (Knees) ×1 IMPLANT
BLADE SAG 18X100X1.27 (BLADE) ×2 IMPLANT
BLADE SAW SGTL 11.0X1.19X90.0M (BLADE) ×2 IMPLANT
BNDG ELASTIC 6X5.8 VLCR STR LF (GAUZE/BANDAGES/DRESSINGS) ×2 IMPLANT
BOWL SMART MIX CTS (DISPOSABLE) ×2 IMPLANT
CEMENT HV SMART SET (Cement) ×4 IMPLANT
COVER SURGICAL LIGHT HANDLE (MISCELLANEOUS) ×2 IMPLANT
COVER WAND RF STERILE (DRAPES) IMPLANT
CUFF TOURN SGL QUICK 34 (TOURNIQUET CUFF) ×2
CUFF TRNQT CYL 34X4.125X (TOURNIQUET CUFF) ×1 IMPLANT
DECANTER SPIKE VIAL GLASS SM (MISCELLANEOUS) ×2 IMPLANT
DRAPE U-SHAPE 47X51 STRL (DRAPES) ×2 IMPLANT
DRSG AQUACEL AG ADV 3.5X10 (GAUZE/BANDAGES/DRESSINGS) ×2 IMPLANT
DURAPREP 26ML APPLICATOR (WOUND CARE) ×2 IMPLANT
ELECT REM PT RETURN 15FT ADLT (MISCELLANEOUS) ×2 IMPLANT
GLOVE SRG 8 PF TXTR STRL LF DI (GLOVE) ×1 IMPLANT
GLOVE SURG ENC MOIS LTX SZ6.5 (GLOVE) ×2 IMPLANT
GLOVE SURG ENC MOIS LTX SZ8 (GLOVE) ×4 IMPLANT
GLOVE SURG UNDER POLY LF SZ7 (GLOVE) ×2 IMPLANT
GLOVE SURG UNDER POLY LF SZ8 (GLOVE) ×2
GLOVE SURG UNDER POLY LF SZ8.5 (GLOVE) ×2 IMPLANT
GOWN STRL REUS W/TWL LRG LVL3 (GOWN DISPOSABLE) ×4 IMPLANT
GOWN STRL REUS W/TWL XL LVL3 (GOWN DISPOSABLE) ×2 IMPLANT
HANDPIECE INTERPULSE COAX TIP (DISPOSABLE) ×2
HOLDER FOLEY CATH W/STRAP (MISCELLANEOUS) ×2 IMPLANT
IMMOBILIZER KNEE 20 (SOFTGOODS) ×2
IMMOBILIZER KNEE 20 THIGH 36 (SOFTGOODS) ×1 IMPLANT
KIT TURNOVER KIT A (KITS) ×2 IMPLANT
MANIFOLD NEPTUNE II (INSTRUMENTS) ×2 IMPLANT
NS IRRIG 1000ML POUR BTL (IV SOLUTION) ×2 IMPLANT
PACK TOTAL KNEE CUSTOM (KITS) ×2 IMPLANT
PADDING CAST COTTON 6X4 STRL (CAST SUPPLIES) ×2 IMPLANT
PENCIL SMOKE EVACUATOR (MISCELLANEOUS) ×2 IMPLANT
PIN DRILL FIX HALF THREAD (BIT) ×2 IMPLANT
PIN STEINMAN FIXATION KNEE (PIN) ×2 IMPLANT
PROTECTOR NERVE ULNAR (MISCELLANEOUS) ×2 IMPLANT
SET HNDPC FAN SPRY TIP SCT (DISPOSABLE) ×1 IMPLANT
STRIP CLOSURE SKIN 1/2X4 (GAUZE/BANDAGES/DRESSINGS) ×4 IMPLANT
SUT MNCRL AB 4-0 PS2 18 (SUTURE) ×2 IMPLANT
SUT STRATAFIX 0 PDS 27 VIOLET (SUTURE) ×2
SUT VIC AB 2-0 CT1 27 (SUTURE) ×6
SUT VIC AB 2-0 CT1 TAPERPNT 27 (SUTURE) ×3 IMPLANT
SUTURE STRATFX 0 PDS 27 VIOLET (SUTURE) ×1 IMPLANT
TIBIAL BASE ROT PLAT SZ 8 KNEE (Knees) ×2 IMPLANT
TRAY FOLEY MTR SLVR 16FR STAT (SET/KITS/TRAYS/PACK) ×2 IMPLANT
TUBE SUCTION HIGH CAP CLEAR NV (SUCTIONS) ×4 IMPLANT
WATER STERILE IRR 1000ML POUR (IV SOLUTION) ×4 IMPLANT
WRAP KNEE MAXI GEL POST OP (GAUZE/BANDAGES/DRESSINGS) ×2 IMPLANT

## 2021-03-14 NOTE — Anesthesia Preprocedure Evaluation (Signed)
Anesthesia Evaluation  Patient identified by MRN, date of birth, ID band Patient awake    Reviewed: Allergy & Precautions, NPO status , Patient's Chart, lab work & pertinent test results  History of Anesthesia Complications Negative for: history of anesthetic complications  Airway Mallampati: II  TM Distance: >3 FB Neck ROM: Full    Dental   Pulmonary sleep apnea and Continuous Positive Airway Pressure Ventilation , former smoker,    Pulmonary exam normal        Cardiovascular hypertension, Pt. on medications + CAD (calcium score 551)  Normal cardiovascular exam     Neuro/Psych negative neurological ROS     GI/Hepatic Neg liver ROS, GERD  Medicated,  Endo/Other  diabetes, Oral Hypoglycemic Agents  Renal/GU negative Renal ROS  negative genitourinary   Musculoskeletal  (+) Arthritis , Osteoarthritis,    Abdominal   Peds  Hematology negative hematology ROS (+)   Anesthesia Other Findings  Hgb 15.6, Plts 117, INR 1.1  Reproductive/Obstetrics                            Anesthesia Physical Anesthesia Plan  ASA: 3  Anesthesia Plan: Spinal   Post-op Pain Management:  Regional for Post-op pain   Induction:   PONV Risk Score and Plan: 1 and Propofol infusion, Treatment may vary due to age or medical condition, Ondansetron and TIVA  Airway Management Planned: Nasal Cannula and Simple Face Mask  Additional Equipment: None  Intra-op Plan:   Post-operative Plan:   Informed Consent: I have reviewed the patients History and Physical, chart, labs and discussed the procedure including the risks, benefits and alternatives for the proposed anesthesia with the patient or authorized representative who has indicated his/her understanding and acceptance.       Plan Discussed with:   Anesthesia Plan Comments:         Anesthesia Quick Evaluation

## 2021-03-14 NOTE — Anesthesia Postprocedure Evaluation (Signed)
Anesthesia Post Note  Patient: Zachary Valdez  Procedure(s) Performed: TOTAL KNEE ARTHROPLASTY (Right: Knee)     Patient location during evaluation: PACU Anesthesia Type: Spinal Level of consciousness: oriented and awake and alert Pain management: pain level controlled Vital Signs Assessment: post-procedure vital signs reviewed and stable Respiratory status: spontaneous breathing, respiratory function stable and nonlabored ventilation Cardiovascular status: blood pressure returned to baseline and stable Postop Assessment: no headache, no backache, no apparent nausea or vomiting and spinal receding Anesthetic complications: no   No notable events documented.  Last Vitals:  Vitals:   03/14/21 1715 03/14/21 1730  BP: (!) 147/84 (!) 152/76  Pulse: 68 72  Resp: 18 20  Temp:    SpO2: 100% 100%    Last Pain:  Vitals:   03/14/21 1730  TempSrc:   PainSc: 0-No pain                 Lidia Collum

## 2021-03-14 NOTE — Progress Notes (Signed)
Orthopedic Tech Progress Note Patient Details:  Zachary Valdez [birth year] [id number]  CPM Right Knee CPM Right Knee: Off Right Knee Flexion (Degrees): 40 Right Knee Extension (Degrees): 10  Post Interventions Patient Tolerated: Well Instructions Provided: Adjustment of device, Care of device, Poper ambulation with device  Rosana Hoes 03/14/2021, 7:45 PM

## 2021-03-14 NOTE — Op Note (Signed)
OPERATIVE REPORT-TOTAL KNEE ARTHROPLASTY   Pre-operative diagnosis- Osteoarthritis  Right knee(s)  Post-operative diagnosis- Osteoarthritis Right knee(s)  Procedure-  Right  Total Knee Arthroplasty  Surgeon- Dione Plover. Agripina Guyette, MD  Assistant- Theresa Duty, PA-C   Anesthesia-   Adductor canal block and spinal  EBL- 25 ml   Drains None  Tourniquet time-  Total Tourniquet Time Documented: Thigh (Right) - 40 minutes Total: Thigh (Right) - 40 minutes     Complications- None  Condition-PACU - hemodynamically stable.   Brief Clinical Note  Zachary Valdez is a 70 y.o. year old male with end stage OA of his right knee with progressively worsening pain and dysfunction. He has constant pain, with activity and at rest and significant functional deficits with difficulties even with ADLs. He has had extensive non-op management including analgesics, injections of cortisone and viscosupplements, and home exercise program, but remains in significant pain with significant dysfunction. Radiographs show bone on bone arthritis medial and patellofemoral. He presents now for right Total Knee Arthroplasty.     Procedure in detail---   The patient is brought into the operating room and positioned supine on the operating table. After successful administration of  Adductor canal block and spinal,   a tourniquet is placed high on the  Right thigh(s) and the lower extremity is prepped and draped in the usual sterile fashion. Time out is performed by the operating team and then the  Right lower extremity is wrapped in Esmarch, knee flexed and the tourniquet inflated to 300 mmHg.       A midline incision is made with a ten blade through the subcutaneous tissue to the level of the extensor mechanism. A fresh blade is used to make a medial parapatellar arthrotomy. Soft tissue over the proximal medial tibia is subperiosteally elevated to the joint line with a knife and into the semimembranosus bursa with a Cobb  elevator. Soft tissue over the proximal lateral tibia is elevated with attention being paid to avoiding the patellar tendon on the tibial tubercle. The patella is everted, knee flexed 90 degrees and the ACL and PCL are removed. Findings are bone on bone medial and patellofemoral with large global osteophytes        The drill is used to create a starting hole in the distal femur and the canal is thoroughly irrigated with sterile saline to remove the fatty contents. The 5 degree Right  valgus alignment guide is placed into the femoral canal and the distal femoral cutting block is pinned to remove 9 mm off the distal femur. Resection is made with an oscillating saw.      The tibia is subluxed forward and the menisci are removed. The extramedullary alignment guide is placed referencing proximally at the medial aspect of the tibial tubercle and distally along the second metatarsal axis and tibial crest. The block is pinned to remove 59mm off the more deficient medial  side. Resection is made with an oscillating saw. Size 8is the most appropriate size for the tibia and the proximal tibia is prepared with the modular drill and keel punch for that size.      The femoral sizing guide is placed and size 8 is most appropriate. Rotation is marked off the epicondylar axis and confirmed by creating a rectangular flexion gap at 90 degrees. The size 8 cutting block is pinned in this rotation and the anterior, posterior and chamfer cuts are made with the oscillating saw. The intercondylar block is then placed and that cut  is made.      Trial size 8 tibial component, trial size 8 posterior stabilized femur and a 10  mm posterior stabilized rotating platform insert trial is placed. Full extension is achieved with excellent varus/valgus and anterior/posterior balance throughout full range of motion. The patella is everted and thickness measured to be 27  mm. Free hand resection is taken to 15 mm, a 41 template is placed, lug holes  are drilled, trial patella is placed, and it tracks normally. Osteophytes are removed off the posterior femur with the trial in place. All trials are removed and the cut bone surfaces prepared with pulsatile lavage. Cement is mixed and once ready for implantation, the size 8 tibial implant, size  8 posterior stabilized femoral component, and the size 41 patella are cemented in place and the patella is held with the clamp. The trial insert is placed and the knee held in full extension. The Exparel (20 ml mixed with 60 ml saline) is injected into the extensor mechanism, posterior capsule, medial and lateral gutters and subcutaneous tissues.  All extruded cement is removed and once the cement is hard the permanent 10 mm posterior stabilized rotating platform insert is placed into the tibial tray.      The wound is copiously irrigated with saline solution and the extensor mechanism closed with # 0 Stratofix suture. The tourniquet is released for a total tourniquet time of 40  minutes. Flexion against gravity is 140 degrees and the patella tracks normally. Subcutaneous tissue is closed with 2.0 vicryl and subcuticular with running 4.0 Monocryl. The incision is cleaned and dried and steri-strips and a bulky sterile dressing are applied. The limb is placed into a knee immobilizer and the patient is awakened and transported to recovery in stable condition.      Please note that a surgical assistant was a medical necessity for this procedure in order to perform it in a safe and expeditious manner. Surgical assistant was necessary to retract the ligaments and vital neurovascular structures to prevent injury to them and also necessary for proper positioning of the limb to allow for anatomic placement of the prosthesis.   Dione Plover Shakaya Bhullar, MD    03/14/2021, 3:11 PM

## 2021-03-14 NOTE — Anesthesia Procedure Notes (Signed)
Anesthesia Regional Block: Adductor canal block   Pre-Anesthetic Checklist: , timeout performed,  Correct Patient, Correct Site, Correct Laterality,  Correct Procedure, Correct Position, site marked,  Risks and benefits discussed,  Surgical consent,  Pre-op evaluation,  At surgeon's request and post-op pain management  Laterality: Right  Prep: chloraprep       Needles:  Injection technique: Single-shot  Needle Type: Echogenic Stimulator Needle     Needle Length: 10cm  Needle Gauge: 20     Additional Needles:   Procedures:,,,, ultrasound used (permanent image in chart),,    Narrative:  Start time: 03/14/2021 1:50 PM End time: 03/14/2021 1:53 PM Injection made incrementally with aspirations every 5 mL.  Performed by: Personally  Anesthesiologist: Lidia Collum, MD  Additional Notes: Standard monitors applied. Skin prepped. Good needle visualization with ultrasound. Injection made in 5cc increments with no resistance to injection. Patient tolerated the procedure well.

## 2021-03-14 NOTE — Care Plan (Signed)
Ortho Bundle Case Management Note  Patient Details  Name: DORAL VENTRELLA MRN: [id number] Date of Birth: 10/31/1950                  R TKA on 03/14/21. DCP: Home with daughter. 1 story home with 1 step. DME: RW & 3in1 ordered through [place]. PT: EO 6/16   DME Arranged:  Gilford Rile rolling, 3-N-1 DME Agency:  [employer]  HH Arranged:    [place] Agency:     Additional Comments: Please contact me with any questions of if this plan should need to change.  Marianne Sofia, RN,CCM [facility]  [phone] 03/14/2021, 12:48 PM

## 2021-03-14 NOTE — H&P (Signed)
TOTAL KNEE ADMISSION H&P  Patient is being admitted for right total knee arthroplasty.  Subjective:  Chief Complaint: Right knee pain.  HPI: Zachary Valdez, 70 y.o. male has a history of pain and functional disability in the right knee due to arthritis and has failed non-surgical conservative treatments for greater than 12 weeks to include NSAID's and/or analgesics and activity modification. Onset of symptoms was gradual, starting  several  years ago with gradually worsening course since that time. The patient noted no past surgery on the right knee.  Patient currently rates pain in the right knee at 6 out of 10 with activity. Patient has worsening of pain with activity and weight bearing and pain that interferes with activities of daily living. Patient has evidence of periarticular osteophytes and joint space narrowing by imaging studies. There is no active infection.  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   Anxiety disorder 06/05/2019   Prediabetes 01/27/2019   Degenerative arthritis of right knee 11/19/2018   Peripheral neuropathy 11/05/2018   Loss of transverse plantar arch 11/05/2018   CAD (coronary artery disease) 10/23/2018   Fatty liver 10/23/2018   Obesity 73/53/2992   Lichen planus 42/68/3419   Neoplasm of uncertain behavior of skin 04/02/2017   Dermatitis 09/11/2016   Rash and nonspecific skin eruption 01/20/2016   Numbness and tingling of left leg 12/06/2015   OA (osteoarthritis) of knee 09/07/2014   Acute sinusitis 08/15/2014   Erectile dysfunction 05/24/2013   Unspecified constipation 03/14/2013   Well adult exam 11/16/2012   Anosmia 05/10/2012   Sinusitis acute 03/07/2012   Foot pain, left 05/09/2011   KNEE PAIN 10/12/2010   DIVERTICULOSIS-COLON 01/10/2010   PERSONAL HX COLONIC POLYPS  01/10/2010   WEIGHT GAIN, ABNORMAL 12/28/2009   CHEST PAIN 12/28/2009   DYSPHAGIA UNSPECIFIED 12/28/2009   SEBACEOUS CYST, INFECTED 12/08/2009   Elevated LFTs 08/13/2009   TOBACCO USE, QUIT 08/13/2009   PHARYNGITIS 06/09/2009   CONTACT DERMATITIS&OTHER ECZEMA DUE TO PLANTS 03/20/2009   NECK PAIN 08/12/2008   FURUNCLE 02/21/2008   OSTEOARTHRITIS 02/21/2008   Essential hypertension 06/27/2007   B12 deficiency 04/27/2007   Dyslipidemia 04/27/2007   ALLERGIC RHINITIS 04/27/2007   GERD 04/27/2007   OSA on CPAP 04/27/2007   HYPERGLYCEMIA 04/27/2007    Past Medical History:  Diagnosis Date   Allergic rhinitis    Anxiety    Diabetes mellitus without complication ([place])    GERD (gastroesophageal reflux disease)    Hyperlipidemia    Hyperplastic colon polyp 2006   Hypertension    OA (osteoarthritis)    knees,    Obesity    Sleep apnea    cpap-    Ulcerative esophagitis 2006   Dr. Fuller Plan   Vitamin B 12 deficiency     Past Surgical History:  Procedure Laterality Date   KNEE ARTHROSCOPY     Right   TOTAL KNEE ARTHROPLASTY Left 09/07/2014   Procedure: LEFT TOTAL KNEE ARTHROPLASTY;  Surgeon: Gearlean Alf, MD;  Location: [facility];  Service: Orthopedics;  Laterality: Left;    Prior to Admission medications   Medication Sig Start Date End Date Taking? Authorizing Provider  Cholecalciferol (VITAMIN D) 125 MCG (5000 UT) CAPS Take 5,000 Units by mouth daily.   Yes [provider]  diclofenac (VOLTAREN) 50 MG EC tablet Take 1 tablet (  50 mg total) by mouth 2 (two) times daily as needed for moderate pain. 07/28/20  Yes Plotnikov, Evie Lacks, MD  gabapentin (NEURONTIN) 100 MG capsule Take 2 capsules (200 mg total) by mouth at bedtime. Patient taking differently: Take 200 mg by mouth at bedtime as needed (pain). 12/16/20  Yes Lyndal Pulley, DO  metFORMIN (GLUCOPHAGE) 500 MG tablet Take 1 tablet (500 mg total) by mouth daily with breakfast. 07/28/20  Yes Plotnikov, Evie Lacks, MD   olmesartan (BENICAR) 40 MG tablet Take 1 tablet (40 mg total) by mouth daily. Annual appt due in Oct must see provider for future refills Patient taking differently: Take 40 mg by mouth daily. 02/16/21  Yes Plotnikov, Evie Lacks, MD  pantoprazole (PROTONIX) 40 MG tablet Take 1 tablet (40 mg total) by mouth as needed. Patient taking differently: Take 40 mg by mouth every other day. 07/28/20  Yes Plotnikov, Evie Lacks, MD  rosuvastatin (CRESTOR) 20 MG tablet Take 20 mg by mouth every other day.   Yes [provider]  vitamin B-12 (CYANOCOBALAMIN) 1000 MCG tablet Take 1 tablet (1,000 mcg total) by mouth daily. 03/12/15  Yes Plotnikov, Evie Lacks, MD  vitamin C (ASCORBIC ACID) 500 MG tablet Take 500 mg by mouth daily.   Yes [provider]  aspirin EC 81 MG tablet Take 1 tablet (81 mg total) by mouth daily. Patient not taking: Reported on 02/22/2021 03/11/16   Plotnikov, Evie Lacks, MD  rosuvastatin (CRESTOR) 40 MG tablet TAKE ONE TABLET BY MOUTH DAILY Patient not taking: Reported on 02/22/2021 12/08/19   Plotnikov, Evie Lacks, MD  tadalafil (CIALIS) 20 MG tablet Take 1 tablet (20 mg total) by mouth daily as needed for erectile dysfunction. 03/12/18 04/11/18  Plotnikov, Evie Lacks, MD    Allergies  Allergen Reactions   Hydrocodone-Acetaminophen Shortness Of Breath    REACTION: SOB, patient states drug made patient feel weird    Atacand [Candesartan Cilexetil]    Atorvastatin     REACTION: achy   Enalapril Cough   Losartan    Nexium [Esomeprazole] Diarrhea   Tramadol     headache   Wellbutrin [Bupropion] Anxiety    Severe     Social History   Socioeconomic History   Marital status: Divorced    Spouse name: Not on file   Number of children: Not on file   Years of education: Not on file   Highest education level: Not on file  Occupational History   Occupation: retired  Tobacco Use   Smoking status: Former    Packs/day: 1.50    Years: 15.00    Pack years: 22.50    Types:  Cigarettes    Quit date: 10/02/1997    Years since quitting: 23.4   Smokeless tobacco: Never  Vaping Use   Vaping Use: Never used  Substance and Sexual Activity   Alcohol use: Yes    Alcohol/week: 14.0 standard drinks    Types: 14 Glasses of wine per week   Drug use: No   Sexual activity: Not Currently  Other Topics Concern   Not on file  Social History Narrative   Daily caffeine- 2-3 per day   Social Determinants of Health   Financial Resource Strain: Low Risk    Difficulty of Paying Living Expenses: Not hard at all  Food Insecurity: No Food Insecurity   Worried About Charity fundraiser in the Last Year: Never true   Ran Out of Food in the Last Year: Never true  Transportation  Needs: No Transportation Needs   Lack of Transportation (Medical): No   Lack of Transportation (Non-Medical): No  Physical Activity: Inactive   Days of Exercise per Week: 0 days   Minutes of Exercise per Session: 0 min  Stress: No Stress Concern Present   Feeling of Stress : Not at all  Social Connections: Moderately Integrated   Frequency of Communication with Friends and Family: More than three times a week   Frequency of Social Gatherings with Friends and Family: More than three times a week   Attends Religious Services: 1 to 4 times per year   Active Member of [employer] or Organizations: No   Attends Music therapist: 1 to 4 times per year   Marital Status: Never married  Human resources officer Violence: Not on file    Tobacco Use: Medium Risk   Smoking Tobacco Use: Former   Smokeless Tobacco Use: Never   Social History   Substance and Sexual Activity  Alcohol Use Yes   Alcohol/week: 14.0 standard drinks   Types: 14 Glasses of wine per week    Family History  Problem Relation Age of Onset   Breast cancer Mother    Hypertension Mother    Hypertension Other    Colon cancer Neg Hx     ROS: Constitutional: no fever, no chills, no night sweats, no significant weight  loss Cardiovascular: no chest pain, no palpitations Respiratory: no cough, no shortness of breath, No COPD Gastrointestinal: no vomiting, no nausea Musculoskeletal: no swelling in Joints, Joint Pain Neurologic: no numbness, no tingling, no difficulty with balance   Objective:  Physical Exam: Well nourished and well developed.  General: Alert and oriented x3, cooperative and pleasant, no acute distress.  Head: normocephalic, atraumatic, neck supple.  Eyes: EOMI.  Respiratory: breath sounds clear in all fields, no wheezing, rales, or rhonchi. Cardiovascular: Regular rate and rhythm, no murmurs, gallops or rubs.  Abdomen: non-tender to palpation and soft, normoactive bowel sounds. Musculoskeletal:  Antalgic gait pattern favoring the right side without using assistive devices.    Right Knee Exam:  Trace effusion.  Range of motion is 0 to 125 degrees.  Moderate crepitus on range of motion of the knee.  Medial joint line tenderness.  No lateral joint line tenderness.  Stable knee.    Sensation and motor function intact in LE. Distal pulses 2+. Calves soft and nontender.    Vital signs in last 24 hours:    Imaging Review AP and lateral of the right knee dated 2021 demonstrate bone-on-bone arthritis in the medial and patellofemoral compartments.   Assessment/Plan:  End stage arthritis, right knee   The patient history, physical examination, clinical judgment of the provider and imaging studies are consistent with end stage degenerative joint disease of the right knee and total knee arthroplasty is deemed medically necessary. The treatment options including medical management, injection therapy arthroscopy and arthroplasty were discussed at length. The risks and benefits of total knee arthroplasty were presented and reviewed. The risks due to aseptic loosening, infection, stiffness, patella tracking problems, thromboembolic complications and other imponderables were discussed. The  patient acknowledged the explanation, agreed to proceed with the plan and consent was signed. Patient is being admitted for inpatient treatment for surgery, pain control, PT, OT, prophylactic antibiotics, VTE prophylaxis, progressive ambulation and ADLs and discharge planning. The patient is planning to be discharged  home .   Patient's anticipated LOS is less than 2 midnights, meeting these requirements: - Lives within 1 hour of care -  Has a competent adult at home to recover with post-op recovery - NO history of  - Chronic pain requiring opiods  - Diabetes  - Coronary Artery Disease  - Heart failure  - Heart attack  - Stroke  - DVT/VTE  - Cardiac arrhythmia  - Respiratory Failure/COPD  - Renal failure  - Anemia  - Advanced Liver disease   Therapy Plans: [facility] Disposition: Home with Daughter Planned DVT Prophylaxis: Aspirin 325mg   DME Needed: [facility] Rile, 3-in-1 PCP: Dayna Ramus, MD -- Clearance received TXA: IV Allergies: Codeine Anesthesia Concerns: Sleeps with CPAP BMI: 38.2 Last HgbA1c: 6.6 (12/16/20)  [facility] on [id number]  Other: Patient doesn't tolerate Codeine. Was prescribed Dilaudid for previous L TKA.    - Patient was instructed on what medications to stop prior to surgery. - Follow-up visit in 2 weeks with Dr. Wynelle Link - Begin physical therapy following surgery - Pre-operative lab work as pre-surgical testing - Prescriptions will be provided in hospital at time of discharge  Fenton Foy, [facility], PA-C Orthopedic Surgery [facility] [place]

## 2021-03-14 NOTE — Progress Notes (Signed)
AssistedDr. Carolyn Witman with right, ultrasound guided, adductor canal block. Side rails up, monitors on throughout procedure. See vital signs in flow sheet. Tolerated Procedure well.  

## 2021-03-14 NOTE — Interval H&P Note (Signed)
History and Physical Interval Note:  03/14/2021 12:53 PM  Zachary Valdez  has presented today for surgery, with the diagnosis of right knee osteoarthritis.  The various methods of treatment have been discussed with the patient and family. After consideration of risks, benefits and other options for treatment, the patient has consented to  Procedure(s) with comments: TOTAL KNEE ARTHROPLASTY (Right) - 29min as a surgical intervention.  The patient's history has been reviewed, patient examined, no change in status, stable for surgery.  I have reviewed the patient's chart and labs.  Questions were answered to the patient's satisfaction.     [name] [name]

## 2021-03-14 NOTE — Progress Notes (Signed)
Orthopedic Tech Progress Note Patient Details:  Zachary Valdez July 12, 1951 [id number]  CPM Right Knee CPM Right Knee: On Right Knee Flexion (Degrees): 40 Right Knee Extension (Degrees): 10  Post Interventions Patient Tolerated: Well Instructions Provided: Adjustment of device, Care of device  Rosana Hoes 03/14/2021, 5:08 PM

## 2021-03-14 NOTE — Discharge Instructions (Signed)
 Frank Aluisio, MD Total Joint Specialist [facility] [address] [place], [place] [address] [phone]  TOTAL KNEE REPLACEMENT POSTOPERATIVE DIRECTIONS    Knee Rehabilitation, Guidelines Following Surgery  Results after knee surgery are often greatly improved when you follow the exercise, range of motion and muscle strengthening exercises prescribed by your doctor. Safety measures are also important to protect the knee from further injury. If any of these exercises cause you to have increased pain or swelling in your knee joint, decrease the amount until you are comfortable again and slowly increase them. If you have problems or questions, call your caregiver or physical therapist for advice.   BLOOD CLOT PREVENTION Take a 325 mg Aspirin two times a day for three weeks following surgery. Then take an 81 mg Aspirin once a day for three weeks. Then discontinue Aspirin. You may resume your vitamins/supplements upon discharge from the hospital. Do not take any NSAIDs (Advil, Aleve, Ibuprofen, Meloxicam, etc.) until you have discontinued the 325 mg Aspirin.  HOME CARE INSTRUCTIONS  Remove items at home which could result in a fall. This includes throw rugs or furniture in walking pathways.  ICE to the affected knee as much as tolerated. Icing helps control swelling. If the swelling is well controlled you will be more comfortable and rehab easier. Continue to use ice on the knee for pain and swelling from surgery. You may notice swelling that will progress down to the foot and ankle. This is normal after surgery. Elevate the leg when you are not up walking on it.    Continue to use the breathing machine which will help keep your temperature down. It is common for your temperature to cycle up and down following surgery, especially at night when you are not up moving around and exerting yourself. The breathing machine keeps your lungs expanded and your temperature  down. Do not place pillow under the operative knee, focus on keeping the knee straight while resting  DIET You may resume your previous home diet once you are discharged from the hospital.  DRESSING / WOUND CARE / SHOWERING Keep your bulky bandage on for 2 days. On the third post-operative day you may remove the Ace bandage and gauze. There is a waterproof adhesive bandage on your skin which will stay in place until your first follow-up appointment. Once you remove this you will not need to place another bandage You may begin showering 3 days following surgery, but do not submerge the incision under water.  ACTIVITY For the first 5 days, the key is rest and control of pain and swelling Do your home exercises twice a day starting on post-operative day 3. On the days you go to physical therapy, just do the home exercises once that day. You should rest, ice and elevate the leg for 50 minutes out of every hour. Get up and walk/stretch for 10 minutes per hour. After 5 days you can increase your activity slowly as tolerated. Walk with your walker as instructed. Use the walker until you are comfortable transitioning to a cane. Walk with the cane in the opposite hand of the operative leg. You may discontinue the cane once you are comfortable and walking steadily. Avoid periods of inactivity such as sitting longer than an hour when not asleep. This helps prevent blood clots.  You may discontinue the knee immobilizer once you are able to perform a straight leg raise while lying down. You may resume a sexual relationship in one month   or when given the OK by your doctor.  You may return to work once you are cleared by your doctor.  Do not drive a car for 6 weeks or until released by your surgeon.  Do not drive while taking narcotics.  TED HOSE STOCKINGS Wear the elastic stockings on both legs for three weeks following surgery during the day. You may remove them at night for sleeping.  WEIGHT  BEARING Weight bearing as tolerated with assist device (walker, cane, etc) as directed, use it as long as suggested by your surgeon or therapist, typically at least 4-6 weeks.  POSTOPERATIVE CONSTIPATION PROTOCOL Constipation - defined medically as fewer than three stools per week and severe constipation as less than one stool per week.  One of the most common issues patients have following surgery is constipation.  Even if you have a regular bowel pattern at home, your normal regimen is likely to be disrupted due to multiple reasons following surgery.  Combination of anesthesia, postoperative narcotics, change in appetite and fluid intake all can affect your bowels.  In order to avoid complications following surgery, here are some recommendations in order to help you during your recovery period.  Colace (docusate) - Pick up an over-the-counter form of Colace or another stool softener and take twice a day as long as you are requiring postoperative pain medications.  Take with a full glass of water daily.  If you experience loose stools or diarrhea, hold the colace until you stool forms back up. If your symptoms do not get better within 1 week or if they get worse, check with your doctor. Dulcolax (bisacodyl) - Pick up over-the-counter and take as directed by the product packaging as needed to assist with the movement of your bowels.  Take with a full glass of water.  Use this product as needed if not relieved by Colace only.  MiraLax (polyethylene glycol) - Pick up over-the-counter to have on hand. MiraLax is a solution that will increase the amount of water in your bowels to assist with bowel movements.  Take as directed and can mix with a glass of water, juice, soda, coffee, or tea. Take if you go more than two days without a movement. Do not use MiraLax more than once per day. Call your doctor if you are still constipated or irregular after using this medication for 7 days in a row.  If you continue  to have problems with postoperative constipation, please contact the office for further assistance and recommendations.  If you experience "the worst abdominal pain ever" or develop nausea or vomiting, please contact the office immediatly for further recommendations for treatment.  ITCHING If you experience itching with your medications, try taking only a single pain pill, or even half a pain pill at a time.  You can also use Benadryl over the counter for itching or also to help with sleep.   MEDICATIONS See your medication summary on the "After Visit Summary" that the nursing staff will review with you prior to discharge.  You may have some home medications which will be placed on hold until you complete the course of blood thinner medication.  It is important for you to complete the blood thinner medication as prescribed by your surgeon.  Continue your approved medications as instructed at time of discharge.  PRECAUTIONS If you experience chest pain or shortness of breath - call 911 immediately for transfer to the hospital emergency department.  If you develop a fever greater that 101 F,   purulent drainage from wound, increased redness or drainage from wound, foul odor from the wound/dressing, or calf pain - CONTACT YOUR SURGEON.                                                   FOLLOW-UP APPOINTMENTS Make sure you keep all of your appointments after your operation with your surgeon and caregivers. You should call the office at the above phone number and make an appointment for approximately two weeks after the date of your surgery or on the date instructed by your surgeon outlined in the "After Visit Summary".  RANGE OF MOTION AND STRENGTHENING EXERCISES  Rehabilitation of the knee is important following a knee injury or an operation. After just a few days of immobilization, the muscles of the thigh which control the knee become weakened and shrink (atrophy). Knee exercises are designed to build up  the tone and strength of the thigh muscles and to improve knee motion. Often times heat used for twenty to thirty minutes before working out will loosen up your tissues and help with improving the range of motion but do not use heat for the first two weeks following surgery. These exercises can be done on a training (exercise) mat, on the floor, on a table or on a bed. Use what ever works the best and is most comfortable for you Knee exercises include:  Leg Lifts - While your knee is still immobilized in a splint or cast, you can do straight leg raises. Lift the leg to 60 degrees, hold for 3 sec, and slowly lower the leg. Repeat 10-20 times 2-3 times daily. Perform this exercise against resistance later as your knee gets better.  Quad and Hamstring Sets - Tighten up the muscle on the front of the thigh (Quad) and hold for 5-10 sec. Repeat this 10-20 times hourly. Hamstring sets are done by pushing the foot backward against an object and holding for 5-10 sec. Repeat as with quad sets.  Leg Slides: Lying on your back, slowly slide your foot toward your buttocks, bending your knee up off the floor (only go as far as is comfortable). Then slowly slide your foot back down until your leg is flat on the floor again. Angel Wings: Lying on your back spread your legs to the side as far apart as you can without causing discomfort.  A rehabilitation program following serious knee injuries can speed recovery and prevent re-injury in the future due to weakened muscles. Contact your doctor or a physical therapist for more information on knee rehabilitation.   POST-OPERATIVE OPIOID TAPER INSTRUCTIONS: It is important to wean off of your opioid medication as soon as possible. If you do not need pain medication after your surgery it is ok to stop day one. Opioids include: Codeine, Hydrocodone(Norco, Vicodin), Oxycodone(Percocet, oxycontin) and hydromorphone amongst others.  Long term and even short term use of opiods can  cause: Increased pain response Dependence Constipation Depression Respiratory depression And more.  Withdrawal symptoms can include Flu like symptoms Nausea, vomiting And more Techniques to manage these symptoms Hydrate well Eat regular healthy meals Stay active Use relaxation techniques(deep breathing, meditating, yoga) Do Not substitute Alcohol to help with tapering If you have been on opioids for less than two weeks and do not have pain than it is ok to stop all together.  Plan   to wean off of opioids This plan should start within one week post op of your joint replacement. Maintain the same interval or time between taking each dose and first decrease the dose.  Cut the total daily intake of opioids by one tablet each day Next start to increase the time between doses. The last dose that should be eliminated is the evening dose.   IF YOU ARE TRANSFERRED TO A SKILLED REHAB FACILITY If the patient is transferred to a skilled rehab facility following release from the hospital, a list of the current medications will be sent to the facility for the patient to continue.  When discharged from the skilled rehab facility, please have the facility set up the patient's Home Health Physical Therapy prior to being released. Also, the skilled facility will be responsible for providing the patient with their medications at time of release from the facility to include their pain medication, the muscle relaxants, and their blood thinner medication. If the patient is still at the rehab facility at time of the two week follow up appointment, the skilled rehab facility will also need to assist the patient in arranging follow up appointment in our office and any transportation needs.  MAKE SURE YOU:  Understand these instructions.  Get help right away if you are not doing well or get worse.   DENTAL ANTIBIOTICS:  In most cases prophylactic antibiotics for Dental procdeures after total joint surgery are  not necessary.  Exceptions are as follows:  1. History of prior total joint infection  2. Severely immunocompromised (Organ Transplant, cancer chemotherapy, Rheumatoid biologic meds such as Humera)  3. Poorly controlled diabetes (A1C &gt; 8.0, blood glucose over 200)  If you have one of these conditions, contact your surgeon for an antibiotic prescription, prior to your dental procedure.    Pick up stool softner and laxative for home use following surgery while on pain medications. Do not submerge incision under water. Please use good hand washing techniques while changing dressing each day. May shower starting three days after surgery. Please use a clean towel to pat the incision dry following showers. Continue to use ice for pain and swelling after surgery. Do not use any lotions or creams on the incision until instructed by your surgeon.  

## 2021-03-14 NOTE — Anesthesia Procedure Notes (Signed)
Spinal  Patient location during procedure: OR End time: 03/14/2021 2:08 PM Reason for block: surgical anesthesia Staffing Performed: resident/CRNA  Resident/CRNA: Arin Vanosdol D, CRNA Preanesthetic Checklist Completed: patient identified, IV checked, site marked, risks and benefits discussed, surgical consent, monitors and equipment checked, pre-op evaluation and timeout performed Spinal Block Patient position: sitting Prep: DuraPrep Patient monitoring: heart rate, continuous pulse ox and blood pressure Approach: right paramedian Location: L3-4 Injection technique: single-shot Needle Needle type: Sprotte  Needle gauge: 24 G Needle length: 9 cm Assessment Sensory level: T6 Events: CSF return Additional Notes Expiration date of kit checked and confirmed. Patient tolerated procedure well, without complications.

## 2021-03-14 NOTE — Transfer of Care (Signed)
Immediate Anesthesia Transfer of Care Note  Patient: Zachary Valdez  Procedure(s) Performed: TOTAL KNEE ARTHROPLASTY (Right: Knee)  Patient Location: PACU  Anesthesia Type:Regional and Spinal  Level of Consciousness: awake, alert  and oriented  Airway & Oxygen Therapy: Patient Spontanous Breathing and Patient connected to face mask oxygen  Post-op Assessment: Report given to RN and Post -op Vital signs reviewed and stable  Post vital signs: Reviewed and stable  Last Vitals:  Vitals Value Taken Time  BP 104/57 03/14/21 1536  Temp    Pulse 82 03/14/21 1538  Resp 15 03/14/21 1538  SpO2 99 % 03/14/21 1538  Vitals shown include unvalidated device data.  Last Pain:  Vitals:   03/14/21 1153  TempSrc:   PainSc: 5       Patients Stated Pain Goal: 4 (28/36/62 9476)  Complications: No notable events documented.

## 2021-03-15 DIAGNOSIS — I10 Essential (primary) hypertension: Secondary | ICD-10-CM | POA: Diagnosis not present

## 2021-03-15 DIAGNOSIS — M25561 Pain in right knee: Secondary | ICD-10-CM | POA: Diagnosis not present

## 2021-03-15 DIAGNOSIS — Z7984 Long term (current) use of oral hypoglycemic drugs: Secondary | ICD-10-CM | POA: Diagnosis not present

## 2021-03-15 DIAGNOSIS — I2510 Atherosclerotic heart disease of native coronary artery without angina pectoris: Secondary | ICD-10-CM | POA: Diagnosis not present

## 2021-03-15 DIAGNOSIS — Z79899 Other long term (current) drug therapy: Secondary | ICD-10-CM | POA: Diagnosis not present

## 2021-03-15 DIAGNOSIS — Z87891 Personal history of nicotine dependence: Secondary | ICD-10-CM | POA: Diagnosis not present

## 2021-03-15 DIAGNOSIS — M1711 Unilateral primary osteoarthritis, right knee: Secondary | ICD-10-CM | POA: Diagnosis not present

## 2021-03-15 DIAGNOSIS — Z96652 Presence of left artificial knee joint: Secondary | ICD-10-CM | POA: Diagnosis not present

## 2021-03-15 DIAGNOSIS — E119 Type 2 diabetes mellitus without complications: Secondary | ICD-10-CM | POA: Diagnosis not present

## 2021-03-15 LAB — BASIC METABOLIC PANEL
Anion gap: 8 (ref 5–15)
BUN: 18 mg/dL (ref 8–23)
CO2: 24 mmol/L (ref 22–32)
Calcium: 8.9 mg/dL (ref 8.9–10.3)
Chloride: 104 mmol/L (ref 98–111)
Creatinine, Ser: 0.93 mg/dL (ref 0.61–1.24)
GFR, Estimated: >60 mL/min (ref >60)
Glucose, Bld: 190 mg/dL — ABNORMAL HIGH (ref 70–99)
Potassium: 4.5 mmol/L (ref 3.5–5.1)
Sodium: 136 mmol/L (ref 135–145)

## 2021-03-15 LAB — CBC
HCT: 44.6 % (ref 39.0–52.0)
Hemoglobin: 14.3 g/dL (ref 13.0–17.0)
MCH: 35 pg — ABNORMAL HIGH (ref 26.0–34.0)
MCHC: 32.1 g/dL (ref 30.0–36.0)
MCV: 109 fL — ABNORMAL HIGH (ref 80.0–100.0)
Platelets: 113 10*3/uL — ABNORMAL LOW (ref 150–400)
RBC: 4.09 MIL/uL — ABNORMAL LOW (ref 4.22–5.81)
RDW: 12.3 % (ref 11.5–15.5)
WBC: 11.1 10*3/uL — ABNORMAL HIGH (ref 4.0–10.5)
nRBC: 0 % (ref 0.0–0.2)

## 2021-03-15 LAB — GLUCOSE, CAPILLARY
Glucose-Capillary: 160 mg/dL — ABNORMAL HIGH (ref 70–99)
Glucose-Capillary: 217 mg/dL — ABNORMAL HIGH (ref 70–99)

## 2021-03-15 MED ORDER — GABAPENTIN 300 MG PO CAPS: ORAL_CAPSULE | ORAL | 0 refills | Status: DC

## 2021-03-15 MED ORDER — ASPIRIN 325 MG PO TBEC: 325.0000 mg | DELAYED_RELEASE_TABLET | Freq: Two times a day (BID) | ORAL | 0 refills | Status: AC

## 2021-03-15 MED ORDER — METHOCARBAMOL 500 MG PO TABS: 500.0000 mg | ORAL_TABLET | Freq: Four times a day (QID) | ORAL | 0 refills | Status: DC | PRN

## 2021-03-15 MED ORDER — HYDROMORPHONE HCL 2 MG PO TABS: 2.0000 mg | ORAL_TABLET | Freq: Four times a day (QID) | ORAL | 0 refills | Status: DC | PRN

## 2021-03-15 NOTE — TOC Transition Note (Signed)
Transition of Care [facility]) - CM/SW Discharge Note   Patient Details  Name: Zachary Valdez MRN: [id number] Date of Birth: July 31, 1951  [facility] CM/SW Contact:  Lennart Pall, LCSW Phone Number: 03/15/2021, 10:36 AM   Clinical Narrative:     Met briefly with pt and confirming he is receiving DME via [facility].  Plan for OPPT at [facility].  No further TOC needs.  Final next level of care: OP Rehab Barriers to Discharge: No Barriers Identified   Patient Goals and CMS Choice Patient states their goals for this hospitalization and ongoing recovery are:: return home      Discharge Placement                       Discharge Plan and Services                DME Arranged: Walker rolling, 3-N-1 DME Agency: [place] Determinants of Health (SDOH) Interventions     Readmission Risk Interventions No flowsheet data found.

## 2021-03-15 NOTE — Progress Notes (Signed)
   Subjective: 1 Day Post-Op Procedure(s) (LRB): TOTAL KNEE ARTHROPLASTY (Right) Patient reports pain as mild.   Patient seen in rounds by Dr. Wynelle Link. Patient is well, and has had no acute complaints or problems. No issues overnight. Denies chest pain, SOB, or calf pain. Foley catheter to be removed this AM. We will begin therapy today.   Objective: Vital signs in last 24 hours: Temp:  [97.5 F (36.4 C)-99.3 F (37.4 C)] 97.5 F (36.4 C) (06/14 0528) Pulse Rate:  [60-90] 75 (06/14 0528) Resp:  [12-26] 18 (06/14 0528) BP: (104-160)/(55-97) 155/90 (06/14 0528) SpO2:  [93 %-100 %] 97 % (06/14 0528) Weight:  [126.1 kg] 126.1 kg (06/13 1153)  Intake/Output from previous day:  Intake/Output Summary (Last 24 hours) at 03/15/2021 0731 Last data filed at 03/15/2021 0600 Gross per 24 hour  Intake 3265 ml  Output 1870 ml  Net 1395 ml     Intake/Output this shift: No intake/output data recorded.  Labs: Recent Labs    03/15/21 0305  HGB 14.3   Recent Labs    03/15/21 0305  WBC 11.1*  RBC 4.09*  HCT 44.6  PLT 113*   Recent Labs    03/15/21 0305  NA 136  K 4.5  CL 104  CO2 24  BUN 18  CREATININE 0.93  GLUCOSE 190*  CALCIUM 8.9   No results for input(s): LABPT, INR in the last 72 hours.  Exam: General - Patient is Alert and Oriented Extremity - Neurologically intact Neurovascular intact Sensation intact distally Dorsiflexion/Plantar flexion intact Dressing - dressing C/D/I Motor Function - intact, moving foot and toes well on exam.   Past Medical History:  Diagnosis Date   Allergic rhinitis    Anxiety    Diabetes mellitus without complication (HCC)    GERD (gastroesophageal reflux disease)    Hyperlipidemia    Hyperplastic colon polyp 2006   Hypertension    OA (osteoarthritis)    knees,    Obesity    Sleep apnea    cpap-    Ulcerative esophagitis 2006   Dr. Fuller Plan   Vitamin B 12 deficiency     Assessment/Plan: 1 Day Post-Op Procedure(s)  (LRB): TOTAL KNEE ARTHROPLASTY (Right) Active Problems:   Primary osteoarthritis of right knee  Estimated body mass index is 38.77 kg/m as calculated from the following:   Height as of this encounter: 5\' 11"  (1.803 m).   Weight as of this encounter: 126.1 kg. Advance diet Up with therapy D/C IV fluids   Patient's anticipated LOS is less than 2 midnights, meeting these requirements: - Younger than 51 - Lives within 1 hour of care - Has a competent adult at home to recover with post-op recover - NO history of  - Chronic pain requiring opiods  - Diabetes  - Coronary Artery Disease  - Heart failure  - Heart attack  - Stroke  - DVT/VTE  - Cardiac arrhythmia  - Respiratory Failure/COPD  - Renal failure  - Anemia  - Advanced Liver disease  DVT Prophylaxis - Aspirin Weight bearing as tolerated. Begin therapy.  Plan is to go Home after hospital stay. Plan for discharge later today if progresses with therapy and meeting his goals. Scheduled for OPPT at [facility]. Follow-up in the office in 2 weeks  The PDMP database was reviewed today prior to any opioid medications being prescribed to this patient.  Theresa Duty, PA-C Orthopedic Surgery [phone] 03/15/2021, 7:31 AM

## 2021-03-15 NOTE — Plan of Care (Signed)
Patient discharged home in stable condition 

## 2021-03-15 NOTE — Progress Notes (Signed)
Physical Therapy Treatment Patient Details Name: Zachary Valdez MRN: [id number] DOB: 1951/09/25 Today's Date: 03/15/2021    History of Present Illness Pt is a 70 year old male admitted 03/14/21 for Rt TKA.  Past medical history significant for sleep apnea and Lt TKA 2015    PT Comments    Pt ambulated in hallway again and practiced one step. Pt provided with HEP handout and plans to f/u with OPPT on Thursday.  Pt had no further questions and feels ready for d/c home today.    Follow Up Recommendations  Follow surgeon's recommendation for DC plan and follow-up therapies;Outpatient PT     Equipment Recommendations  Rolling walker with 5" wheels    Recommendations for Other Services       Precautions / Restrictions Precautions Precautions: Fall;Knee Precaution Comments: able to perform SLR Restrictions Weight Bearing Restrictions: No Other Position/Activity Restrictions: WBAT    Mobility  Bed Mobility Overal bed mobility: Needs Assistance Bed Mobility: Supine to Sit     Supine to sit: Supervision;HOB elevated     General bed mobility comments: pt in recliner    Transfers Overall transfer level: Needs assistance Equipment used: Rolling walker (2 wheeled) Transfers: Sit to/from Stand Sit to Stand: From elevated surface;Min guard         General transfer comment: cues for UE and LE positioning  Ambulation/Gait Ambulation/Gait assistance: Min guard Gait Distance (Feet): 200 Feet Assistive device: Rolling walker (2 wheeled) Gait Pattern/deviations: Step-to pattern;Decreased stance time - right;Antalgic     General Gait Details: verbal cues for sequence, allowing right knee flexion, RW positioning   Stairs Stairs: Yes Stairs assistance: Min guard Stair Management: Step to pattern;Forwards;With walker Number of Stairs: 1 General stair comments: verbal cues for sequence and safety   Wheelchair Mobility    Modified Rankin (Stroke Patients Only)        Balance                                            Cognition Arousal/Alertness: Awake/alert Behavior During Therapy: WFL for tasks assessed/performed Overall Cognitive Status: Within Functional Limits for tasks assessed                                        Exercises   General Comments        Pertinent Vitals/Pain Pain Assessment: 0-10 Pain Score: 4  Pain Location: right knee Pain Descriptors / Indicators: Sore;Aching;Tender Pain Intervention(s): Repositioned;Monitored during session    Home Living Family/patient expects to be discharged to:: Private residence Living Arrangements: Spouse/significant other;Children Available Help at Discharge: Family;Available PRN/intermittently Type of Home: House Home Access: Stairs to enter   Home Layout: One level Home Equipment: None Additional Comments: reports his ex-wife and daughter will be staying with him to assist as needed    Prior Function Level of Independence: Independent          PT Goals (current goals can now be found in the care plan section) Acute Rehab PT Goals PT Goal Formulation: With patient Time For Goal Achievement: 03/22/21 Potential to Achieve Goals: Good Progress towards PT goals: Progressing toward goals    Frequency    7X/week      PT Plan Current plan remains appropriate    Co-evaluation  AM-PAC PT "6 Clicks" Mobility   Outcome Measure  Help needed turning from your back to your side while in a flat bed without using bedrails?: None Help needed moving from lying on your back to sitting on the side of a flat bed without using bedrails?: A Little Help needed moving to and from a bed to a chair (including a wheelchair)?: A Little Help needed standing up from a chair using your arms (Valdez.g., wheelchair or bedside chair)?: A Little Help needed to walk in hospital room?: A Little Help needed climbing 3-5 steps with a railing? : A Little 6  Click Score: 19    End of Session Equipment Utilized During Treatment: Gait belt Activity Tolerance: Patient tolerated treatment well Patient left: with call bell/phone within reach;with chair alarm set;in chair Nurse Communication: Mobility status PT Visit Diagnosis: Difficulty in walking, not elsewhere classified (R26.2)     Time: 4098-1191 PT Time Calculation (min) (ACUTE ONLY): 19 min  Charges:  $Gait Training: 8-22 mins          Zachary Valdez, DPT Acute Rehabilitation Services Pager: [phone] Office: [phone]    Zachary Valdez,Zachary Valdez 03/15/2021, 4:08 PM

## 2021-03-15 NOTE — Progress Notes (Signed)
I have reviewed and concur with this student's documentation.   

## 2021-03-15 NOTE — Evaluation (Signed)
Physical Therapy Evaluation Patient Details Name: Zachary Valdez MRN: [id number] DOB: 08/04/1951 Today's Date: 03/15/2021   History of Present Illness  Pt is a 70 year old male admitted 03/14/21 for Rt TKA.  Past medical history significant for sleep apnea and Lt TKA 2015  Clinical Impression  Pt is s/p TKA resulting in the deficits listed below (see PT Problem List). Pt will benefit from skilled PT to increase their independence and safety with mobility to allow discharge to the venue listed below.  Pt assisted with ambulating in hallway and performed a few exercises.  Pt plans to hopefully d/c home later today.  Will return to ambulate again and practice step prior to d/c.     Follow Up Recommendations Follow surgeon's recommendation for DC plan and follow-up therapies;Outpatient PT    Equipment Recommendations  Rolling walker with 5" wheels    Recommendations for Other Services       Precautions / Restrictions Precautions Precautions: Fall;Knee Precaution Comments: able to perform SLR Restrictions Weight Bearing Restrictions: No Other Position/Activity Restrictions: WBAT      Mobility  Bed Mobility Overal bed mobility: Needs Assistance Bed Mobility: Supine to Sit     Supine to sit: Supervision;HOB elevated          Transfers Overall transfer level: Needs assistance Equipment used: Rolling walker (2 wheeled) Transfers: Sit to/from Stand Sit to Stand: Min assist;From elevated surface         General transfer comment: assist to rise and steady, cues for UE and LE positioning  Ambulation/Gait Ambulation/Gait assistance: Min guard Gait Distance (Feet): 160 Feet Assistive device: Rolling walker (2 wheeled) Gait Pattern/deviations: Step-to pattern;Decreased stance time - right;Antalgic     General Gait Details: verbal cues for sequence, allowing right knee flexion, RW positioning  Stairs            Wheelchair Mobility    Modified Rankin (Stroke Patients  Only)       Balance                                             Pertinent Vitals/Pain Pain Assessment: 0-10 Pain Score: 5  Pain Location: right knee Pain Descriptors / Indicators: Sore;Aching;Tender Pain Intervention(s): Monitored during session;Repositioned;Premedicated before session    Home Living Family/patient expects to be discharged to:: Private residence Living Arrangements: Spouse/significant other;Children Available Help at Discharge: Family;Available PRN/intermittently Type of Home: House Home Access: Stairs to enter   [employer] of Steps: 1 Home Layout: One level Home Equipment: None Additional Comments: reports his ex-wife and daughter will be staying with him to assist as needed    Prior Function Level of Independence: Independent               Hand Dominance        Extremity/Trunk Assessment        Lower Extremity Assessment Lower Extremity Assessment: RLE deficits/detail RLE Deficits / Details: able to perform SLR and ankle pumps, Rt knee AAROM approx -8-80*       Communication   Communication: No difficulties  Cognition Arousal/Alertness: Awake/alert Behavior During Therapy: WFL for tasks assessed/performed Overall Cognitive Status: Within Functional Limits for tasks assessed  General Comments      Exercises Total Joint Exercises Ankle Circles/Pumps: AROM;10 reps;Both Quad Sets: AROM;Right;10 reps Heel Slides: AAROM;Seated;Right;10 reps Straight Leg Raises: AROM;Right;10 reps   Assessment/Plan    PT Assessment Patient needs continued PT services  PT Problem List Decreased strength;Decreased activity tolerance;Decreased balance;Decreased mobility;Decreased knowledge of precautions;Pain;Decreased range of motion;Decreased knowledge of use of DME       PT Treatment Interventions Stair training;Gait training;DME instruction;Therapeutic  exercise;Balance training;Functional mobility training;Therapeutic activities;Patient/family education    PT Goals (Current goals can be found in the Care Plan section)  Acute Rehab PT Goals PT Goal Formulation: With patient Time For Goal Achievement: 03/22/21 Potential to Achieve Goals: Good    Frequency 7X/week   Barriers to discharge        Co-evaluation               AM-PAC PT "6 Clicks" Mobility  Outcome Measure Help needed turning from your back to your side while in a flat bed without using bedrails?: None Help needed moving from lying on your back to sitting on the side of a flat bed without using bedrails?: A Little Help needed moving to and from a bed to a chair (including a wheelchair)?: A Little Help needed standing up from a chair using your arms (e.g., wheelchair or bedside chair)?: A Little Help needed to walk in hospital room?: A Little Help needed climbing 3-5 steps with a railing? : A Little 6 Click Score: 19    End of Session Equipment Utilized During Treatment: Gait belt Activity Tolerance: Patient tolerated treatment well Patient left: in chair;with call bell/phone within reach;with chair alarm set Nurse Communication: Mobility status PT Visit Diagnosis: Difficulty in walking, not elsewhere classified (R26.2)    Time: 6720-9470 PT Time Calculation (min) (ACUTE ONLY): 30 min   Charges:   PT Evaluation $PT Eval Low Complexity: 1 Low PT Treatments $Therapeutic Exercise: 8-22 mins       Jannette Spanner PT, DPT Acute Rehabilitation Services Pager: [phone] Office: [phone]  York Ram E 03/15/2021, 12:52 PM

## 2021-03-16 ENCOUNTER — Encounter (HOSPITAL_COMMUNITY): Payer: Self-pay | Admitting: Orthopedic Surgery

## 2021-03-17 DIAGNOSIS — M25561 Pain in right knee: Secondary | ICD-10-CM | POA: Diagnosis not present

## 2021-03-21 DIAGNOSIS — M25561 Pain in right knee: Secondary | ICD-10-CM | POA: Diagnosis not present

## 2021-03-21 NOTE — Discharge Summary (Signed)
Physician Discharge Summary   Patient ID: Zachary Valdez MRN: [id number] DOB/AGE: 1950/12/18 70 y.o.  Admit date: 03/14/2021 Discharge date: 03/15/2021  Primary Diagnosis: Osteoarthritis, right knee   Admission Diagnoses:  Past Medical History:  Diagnosis Date   Allergic rhinitis    Anxiety    Diabetes mellitus without complication ([place])    GERD (gastroesophageal reflux disease)    Hyperlipidemia    Hyperplastic colon polyp 2006   Hypertension    OA (osteoarthritis)    knees,    Obesity    Sleep apnea    cpap-    Ulcerative esophagitis 2006   Dr. Fuller Plan   Vitamin B 12 deficiency    Discharge Diagnoses:   Active Problems:   Primary osteoarthritis of right knee  Estimated body mass index is 38.77 kg/m as calculated from the following:   Height as of this encounter: 5\' 11"  (1.803 m).   Weight as of this encounter: 126.1 kg.  Procedure:  Procedure(s) (LRB): TOTAL KNEE ARTHROPLASTY (Right)   Consults: None  HPI: Zachary Valdez is a 70 y.o. year old male with end stage OA of his right knee with progressively worsening pain and dysfunction. He has constant pain, with activity and at rest and significant functional deficits with difficulties even with ADLs. He has had extensive non-op management including analgesics, injections of cortisone and viscosupplements, and home exercise program, but remains in significant pain with significant dysfunction. Radiographs show bone on bone arthritis medial and patellofemoral. He presents now for right Total Knee Arthroplasty.     Laboratory Data: Admission on 03/14/2021, Discharged on 03/15/2021  Component Date Value Ref Range Status   Glucose-Capillary 03/14/2021 121 (A) 70 - 99 mg/dL Final   Glucose reference range applies only to samples taken after fasting for at least 8 hours.   Comment 1 03/14/2021 Document in Chart   Final   WBC 03/15/2021 11.1 (A) 4.0 - 10.5 K/uL Final   RBC 03/15/2021 4.09 (A) 4.22 - 5.81 MIL/uL Final    Hemoglobin 03/15/2021 14.3  13.0 - 17.0 g/dL Final   HCT 03/15/2021 44.6  39.0 - 52.0 % Final   MCV 03/15/2021 109.0 (A) 80.0 - 100.0 fL Final   MCH 03/15/2021 35.0 (A) 26.0 - 34.0 pg Final   MCHC 03/15/2021 32.1  30.0 - 36.0 g/dL Final   RDW 03/15/2021 12.3  11.5 - 15.5 % Final   Platelets 03/15/2021 113 (A) 150 - 400 K/uL Final   Comment: Immature Platelet Fraction may be clinically indicated, consider ordering this additional test [id number]    nRBC 03/15/2021 0.0  0.0 - 0.2 % Final   Performed at [facility], [place] [address] [place], [place] [address]   Sodium 03/15/2021 136  135 - 145 mmol/L Final   Potassium 03/15/2021 4.5  3.5 - 5.1 mmol/L Final   Chloride 03/15/2021 104  98 - 111 mmol/L Final   CO2 03/15/2021 24  22 - 32 mmol/L Final   Glucose, Bld 03/15/2021 190 (A) 70 - 99 mg/dL Final   Glucose reference range applies only to samples taken after fasting for at least 8 hours.   BUN 03/15/2021 18  8 - 23 mg/dL Final   Creatinine, Ser 03/15/2021 0.93  0.61 - 1.24 mg/dL Final   Calcium 03/15/2021 8.9  8.9 - 10.3 mg/dL Final   GFR, Estimated 03/15/2021 >60  >60 mL/min Final   Comment: (NOTE) Calculated using the CKD-EPI Creatinine Equation (2021)    Anion gap 03/15/2021 8  5 - 15 Final   Performed at [facility], [place] [address] [place], [place] [address]   Glucose-Capillary 03/14/2021 317 (A) 70 - 99 mg/dL Final   Glucose reference range applies only to samples taken after fasting for at least 8 hours.   Glucose-Capillary 03/15/2021 160 (A) 70 - 99 mg/dL Final   Glucose reference range applies only to samples taken after fasting for at least 8 hours.   Glucose-Capillary 03/15/2021 217 (A) 70 - 99 mg/dL Final   Glucose reference range applies only to samples taken after fasting for at least 8 hours.  Hospital Outpatient Visit on 03/10/2021  Component Date Value Ref Range Status   SARS Coronavirus 2 03/10/2021 NEGATIVE  NEGATIVE Final    Comment: (NOTE) SARS-CoV-2 target nucleic acids are NOT DETECTED.  The SARS-CoV-2 RNA is generally detectable in upper and lower respiratory specimens during the acute phase of infection. Negative results do not preclude SARS-CoV-2 infection, do not rule out co-infections with other pathogens, and should not be used as the sole basis for treatment or other patient management decisions. Negative results must be combined with clinical observations, patient history, and epidemiological information. The expected result is Negative.  Fact Sheet for Patients: SugarRoll.be  Fact Sheet for Healthcare Providers: https://www.woods-mathews.com/  This test is not yet approved or cleared by the [place] FDA and  has been authorized for detection and/or diagnosis of SARS-CoV-2 by FDA under an Emergency Use Authorization (EUA). This EUA will remain  in effect (meaning this test can be used) for the duration of the COVID-19 declaration under Se                          ction 564(b)(1) of the Act, 21 U.S.C. section 360bbb-3(b)(1), unless the authorization is terminated or revoked sooner.  Performed at [facility], [place] [address], [place], [place] [address]   Hospital Outpatient Visit on 03/04/2021  Component Date Value Ref Range Status   Hgb A1c MFr Bld 03/04/2021 7.2 (A) 4.8 - 5.6 % Final   Comment: (NOTE)         Prediabetes: 5.7 - 6.4         Diabetes: >6.4         Glycemic control for adults with diabetes: <7.0    Mean Plasma Glucose 03/04/2021 160  mg/dL Final   Comment: (NOTE) Performed At: [facility] [place], [place] [id number] Rush Farmer MD MV:[id number]    WBC 03/04/2021 7.2  4.0 - 10.5 K/uL Final   RBC 03/04/2021 4.52  4.22 - 5.81 MIL/uL Final   Hemoglobin 03/04/2021 15.6  13.0 - 17.0 g/dL Final   HCT 03/04/2021 48.6  39.0 - 52.0 % Final   MCV 03/04/2021 107.5 (A) 80.0 - 100.0 fL Final   MCH  03/04/2021 34.5 (A) 26.0 - 34.0 pg Final   MCHC 03/04/2021 32.1  30.0 - 36.0 g/dL Final   RDW 03/04/2021 12.5  11.5 - 15.5 % Final   Platelets 03/04/2021 117 (A) 150 - 400 K/uL Final   Comment: Immature Platelet Fraction may be clinically indicated, consider ordering this additional test [id number]    nRBC 03/04/2021 0.0  0.0 - 0.2 % Final   Performed at [facility], [place] [address], [place], [place] [address]   Sodium 03/04/2021 139  135 - 145 mmol/L Final   Potassium 03/04/2021 4.3  3.5 - 5.1 mmol/L Final   Chloride 03/04/2021 107  98 - 111 mmol/L Final   CO2 03/04/2021 24  22 - 32 mmol/L Final   Glucose, Bld 03/04/2021 132 (A) 70 - 99 mg/dL Final   Glucose reference range applies only to samples taken after fasting for at least 8 hours.   BUN 03/04/2021 17  8 - 23 mg/dL Final   Creatinine, Ser 03/04/2021 0.88  0.61 - 1.24 mg/dL Final   Calcium 03/04/2021 9.4  8.9 - 10.3 mg/dL Final   Total Protein 03/04/2021 7.8  6.5 - 8.1 g/dL Final   Albumin 03/04/2021 3.7  3.5 - 5.0 g/dL Final   AST 03/04/2021 53 (A) 15 - 41 U/L Final   ALT 03/04/2021 40  0 - 44 U/L Final   Alkaline Phosphatase 03/04/2021 64  38 - 126 U/L Final   Total Bilirubin 03/04/2021 1.2  0.3 - 1.2 mg/dL Final   GFR, Estimated 03/04/2021 >60  >60 mL/min Final   Comment: (NOTE) Calculated using the CKD-EPI Creatinine Equation (2021)    Anion gap 03/04/2021 8  5 - 15 Final   Performed at [facility], [place] [address], [place], [place] [address]   Prothrombin Time 03/04/2021 14.4  11.4 - 15.2 seconds Final   INR 03/04/2021 1.1  0.8 - 1.2 Final   Comment: (NOTE) INR goal varies based on device and disease states. Performed at [facility], [place] [address] [place], [place] [address]    MRSA, PCR 03/04/2021 NEGATIVE  NEGATIVE Final   Staphylococcus aureus 03/04/2021 NEGATIVE  NEGATIVE Final   Comment: (NOTE) The Xpert SA Assay (FDA approved for NASAL specimens in  patients 16 years of age and older), is one component of a comprehensive surveillance program. It is not intended to diagnose infection nor to guide or monitor treatment. Performed at [facility], [place] [address] [place], [place] [address]    Glucose-Capillary 03/04/2021 156 (A) 70 - 99 mg/dL Final   Glucose reference range applies only to samples taken after fasting for at least 8 hours.     X-Rays:No results found.  EKG: Orders placed or performed in visit on 02/17/21   EKG 12-Lead     Hospital Course: Zachary Valdez is a 70 y.o. who was admitted to [facility]. They were brought to the operating room on 03/14/2021 and underwent Procedure(s): TOTAL KNEE ARTHROPLASTY.  Patient tolerated the procedure well and was later transferred to the recovery room and then to the orthopaedic floor for postoperative care. They were given PO and IV analgesics for pain control following their surgery. They were given 24 hours of postoperative antibiotics of  Anti-infectives (From admission, onward)    Start     Dose/Rate Route Frequency Ordered Stop   03/14/21 2000  ceFAZolin (ANCEF) IVPB 2g/100 mL premix        2 g 200 mL/hr over 30 Minutes Intravenous Every 6 hours 03/14/21 1822 03/15/21 0158   03/14/21 0600  ceFAZolin (ANCEF) IVPB 3g/100 mL premix        3 g 200 mL/hr over 30 Minutes Intravenous On call to O.R. 03/13/21 4166 03/14/21 1410      and started on DVT prophylaxis in the form of Aspirin.   PT and OT were ordered for total joint protocol. Discharge planning consulted to help with postop disposition and equipment needs.  Patient had a good night on the evening of surgery. They started to get up OOB with therapy on POD #1. Pt was seen during rounds and was  ready to go home pending progress with therapy. He worked with therapy on POD #1 and was meeting his goals. Pt was discharged to home later that day in stable condition.  Diet: Regular diet Activity:  WBAT Follow-up: in 2 weeks Disposition: Home with OPPT Discharged Condition: stable   Discharge Instructions     Call MD / Call 911   Complete by: As directed    If you experience chest pain or shortness of breath, CALL 911 and be transported to the hospital emergency room.  If you develope a fever above 101 F, pus (white drainage) or increased drainage or redness at the wound, or calf pain, call your surgeon's office.   Change dressing   Complete by: As directed    You may remove the bulky bandage (ACE wrap and gauze) two days after surgery. You will have an adhesive waterproof bandage underneath. Leave this in place until your first follow-up appointment.   Constipation Prevention   Complete by: As directed    Drink plenty of fluids.  Prune juice may be helpful.  You may use a stool softener, such as Colace (over the counter) 100 mg twice a day.  Use MiraLax (over the counter) for constipation as needed.   Diet - low sodium heart healthy   Complete by: As directed    Do not put a pillow under the knee. Place it under the heel.   Complete by: As directed    Driving restrictions   Complete by: As directed    No driving for two weeks   Post-operative opioid taper instructions:   Complete by: As directed    POST-OPERATIVE OPIOID TAPER INSTRUCTIONS: It is important to wean off of your opioid medication as soon as possible. If you do not need pain medication after your surgery it is ok to stop day one. Opioids include: Codeine, Hydrocodone(Norco, Vicodin), Oxycodone(Percocet, oxycontin) and hydromorphone amongst others.  Long term and even short term use of opiods can cause: Increased pain response Dependence Constipation Depression Respiratory depression And more.  Withdrawal symptoms can include Flu like symptoms Nausea, vomiting And more Techniques to manage these symptoms Hydrate well Eat regular healthy meals Stay active Use relaxation techniques(deep breathing,  meditating, yoga) Do Not substitute Alcohol to help with tapering If you have been on opioids for less than two weeks and do not have pain than it is ok to stop all together.  Plan to wean off of opioids This plan should start within one week post op of your joint replacement. Maintain the same interval or time between taking each dose and first decrease the dose.  Cut the total daily intake of opioids by one tablet each day Next start to increase the time between doses. The last dose that should be eliminated is the evening dose.      TED hose   Complete by: As directed    Use stockings (TED hose) for three weeks on both leg(s).  You may remove them at night for sleeping.   Weight bearing as tolerated   Complete by: As directed       Allergies as of 03/15/2021       Reactions   Hydrocodone-acetaminophen Shortness Of Breath   REACTION: SOB, patient states drug made patient feel weird    Atorvastatin    REACTION: achy   Candesartan Cough   Enalapril Cough   Losartan Cough   Nexium [esomeprazole] Diarrhea   Tramadol    headache   Wellbutrin [bupropion] Anxiety  Severe         Medication List     STOP taking these medications    diclofenac 50 MG EC tablet Commonly known as: VOLTAREN       TAKE these medications    aspirin 325 MG EC tablet Take 1 tablet (325 mg total) by mouth 2 (two) times daily for 20 days. Then take one 81 mg aspirin once a day for three weeks. Then discontinue aspirin. What changed:  medication strength how much to take when to take this additional instructions   gabapentin 300 MG capsule Commonly known as: NEURONTIN Take a 300 mg capsule three times a day for two weeks following surgery.Then take a 300 mg capsule two times a day for two weeks. Then take a 300 mg capsule once a day for two weeks. Then resume as taking prior to surgery. What changed:  medication strength how much to take how to take this when to take this additional  instructions   HYDROmorphone 2 MG tablet Commonly known as: DILAUDID Take 1-2 tablets (2-4 mg total) by mouth every 6 (six) hours as needed for severe pain or moderate pain.   metFORMIN 500 MG tablet Commonly known as: GLUCOPHAGE Take 1 tablet (500 mg total) by mouth daily with breakfast.   methocarbamol 500 MG tablet Commonly known as: ROBAXIN Take 1 tablet (500 mg total) by mouth every 6 (six) hours as needed for muscle spasms.   rosuvastatin 20 MG tablet Commonly known as: CRESTOR Take 20 mg by mouth every other day. What changed: Another medication with the same name was removed. Continue taking this medication, and follow the directions you see here.   tadalafil 20 MG tablet Commonly known as: Cialis Take 1 tablet (20 mg total) by mouth daily as needed for erectile dysfunction.   vitamin B-12 1000 MCG tablet Commonly known as: CYANOCOBALAMIN Take 1 tablet (1,000 mcg total) by mouth daily.   vitamin C 500 MG tablet Commonly known as: ASCORBIC ACID Take 500 mg by mouth daily.   Vitamin D 125 MCG (5000 UT) Caps Take 5,000 Units by mouth daily.       ASK your doctor about these medications    olmesartan 40 MG tablet Commonly known as: BENICAR Take 1 tablet (40 mg total) by mouth daily. Annual appt due in Oct must see provider for future refills   pantoprazole 40 MG tablet Commonly known as: PROTONIX Take 1 tablet (40 mg total) by mouth as needed.               Discharge Care Instructions  (From admission, onward)           Start     Ordered   03/15/21 0000  Weight bearing as tolerated        03/15/21 0737   03/15/21 0000  Change dressing       Comments: You may remove the bulky bandage (ACE wrap and gauze) two days after surgery. You will have an adhesive waterproof bandage underneath. Leave this in place until your first follow-up appointment.   03/15/21 0737            Follow-up Information     Gaynelle Arabian, MD Follow up on 03/29/2021.    Specialty: Orthopedic Surgery Why: You are scheduled for first post op appointment on Tuesday June 28th at 1:00pm. Contact information: [address] [place] [address] [phone]         Rosilyn Mings.. Go on 03/17/2021.  Why: You are scheduled for physical therapy eval on Thursday June 16th at 2:30pm. Contact information: [place] [address] [phone]                 Signed: Theresa Duty, PA-C Orthopedic Surgery 03/21/2021, 1:19 PM

## 2021-03-23 DIAGNOSIS — M25561 Pain in right knee: Secondary | ICD-10-CM | POA: Diagnosis not present

## 2021-03-25 DIAGNOSIS — Z96651 Presence of right artificial knee joint: Secondary | ICD-10-CM | POA: Diagnosis not present

## 2021-03-25 DIAGNOSIS — Z471 Aftercare following joint replacement surgery: Secondary | ICD-10-CM | POA: Diagnosis not present

## 2021-03-25 DIAGNOSIS — M25561 Pain in right knee: Secondary | ICD-10-CM | POA: Diagnosis not present

## 2021-03-28 DIAGNOSIS — M25561 Pain in right knee: Secondary | ICD-10-CM | POA: Diagnosis not present

## 2021-03-30 DIAGNOSIS — M25561 Pain in right knee: Secondary | ICD-10-CM | POA: Diagnosis not present

## 2021-04-01 DIAGNOSIS — M25561 Pain in right knee: Secondary | ICD-10-CM | POA: Diagnosis not present

## 2021-04-05 DIAGNOSIS — M25561 Pain in right knee: Secondary | ICD-10-CM | POA: Diagnosis not present

## 2021-04-07 DIAGNOSIS — M25561 Pain in right knee: Secondary | ICD-10-CM | POA: Diagnosis not present

## 2021-04-12 DIAGNOSIS — M25561 Pain in right knee: Secondary | ICD-10-CM | POA: Diagnosis not present

## 2021-04-14 DIAGNOSIS — M25561 Pain in right knee: Secondary | ICD-10-CM | POA: Diagnosis not present

## 2021-04-18 DIAGNOSIS — M25561 Pain in right knee: Secondary | ICD-10-CM | POA: Diagnosis not present

## 2021-04-19 DIAGNOSIS — Z471 Aftercare following joint replacement surgery: Secondary | ICD-10-CM | POA: Diagnosis not present

## 2021-04-19 DIAGNOSIS — Z96651 Presence of right artificial knee joint: Secondary | ICD-10-CM | POA: Diagnosis not present

## 2021-04-21 DIAGNOSIS — M25561 Pain in right knee: Secondary | ICD-10-CM | POA: Diagnosis not present

## 2021-04-27 DIAGNOSIS — M25561 Pain in right knee: Secondary | ICD-10-CM | POA: Diagnosis not present

## 2021-04-29 DIAGNOSIS — M25561 Pain in right knee: Secondary | ICD-10-CM | POA: Diagnosis not present

## 2021-05-03 DIAGNOSIS — M25561 Pain in right knee: Secondary | ICD-10-CM | POA: Diagnosis not present

## 2021-05-05 DIAGNOSIS — M25561 Pain in right knee: Secondary | ICD-10-CM | POA: Diagnosis not present

## 2021-05-11 ENCOUNTER — Ambulatory Visit (INDEPENDENT_AMBULATORY_CARE_PROVIDER_SITE_OTHER): Payer: Medicare HMO | Admitting: Internal Medicine

## 2021-05-11 ENCOUNTER — Encounter: Payer: Self-pay | Admitting: Internal Medicine

## 2021-05-11 ENCOUNTER — Ambulatory Visit (INDEPENDENT_AMBULATORY_CARE_PROVIDER_SITE_OTHER): Payer: Medicare HMO

## 2021-05-11 ENCOUNTER — Other Ambulatory Visit: Payer: Self-pay

## 2021-05-11 VITALS — BP 148/80 | HR 89 | Temp 98.2°F | Ht 71.0 in | Wt 266.8 lb

## 2021-05-11 DIAGNOSIS — R9389 Abnormal findings on diagnostic imaging of other specified body structures: Secondary | ICD-10-CM

## 2021-05-11 DIAGNOSIS — I700 Atherosclerosis of aorta: Secondary | ICD-10-CM

## 2021-05-11 DIAGNOSIS — Z6835 Body mass index (BMI) 35.0-35.9, adult: Secondary | ICD-10-CM | POA: Diagnosis not present

## 2021-05-11 DIAGNOSIS — M25561 Pain in right knee: Secondary | ICD-10-CM

## 2021-05-11 DIAGNOSIS — R7309 Other abnormal glucose: Secondary | ICD-10-CM | POA: Diagnosis not present

## 2021-05-11 MED ORDER — MELOXICAM 15 MG PO TABS: 15.0000 mg | ORAL_TABLET | Freq: Every day | ORAL | 3 refills | Status: DC | PRN

## 2021-05-11 NOTE — Assessment & Plan Note (Signed)
Recent - repeat CXR w/markers

## 2021-05-11 NOTE — Progress Notes (Signed)
Subjective:  Patient ID: Zachary Valdez, male    DOB: 01-Oct-1951  Age: 70 y.o. MRN: [id number]  CC: Follow-up   HPI DECODA DELMASTRO presents for elevated glucose C/o pain in the R knee - post TKR C/o insomnia Pt lost wt  Outpatient Medications Prior to Visit  Medication Sig Dispense Refill   Cholecalciferol (VITAMIN D) 125 MCG (5000 UT) CAPS Take 5,000 Units by mouth daily.     metFORMIN (GLUCOPHAGE) 500 MG tablet Take 1 tablet (500 mg total) by mouth daily with breakfast. 90 tablet 3   olmesartan (BENICAR) 40 MG tablet Take 1 tablet (40 mg total) by mouth daily. Annual appt due in Oct must see provider for future refills (Patient taking differently: Take 40 mg by mouth daily.) 90 tablet 1   pantoprazole (PROTONIX) 40 MG tablet Take 1 tablet (40 mg total) by mouth as needed. (Patient taking differently: Take 40 mg by mouth every other day.) 90 tablet 3   rosuvastatin (CRESTOR) 20 MG tablet Take 20 mg by mouth every other day.     vitamin B-12 (CYANOCOBALAMIN) 1000 MCG tablet Take 1 tablet (1,000 mcg total) by mouth daily. 100 tablet 3   vitamin C (ASCORBIC ACID) 500 MG tablet Take 500 mg by mouth daily.     tadalafil (CIALIS) 20 MG tablet Take 1 tablet (20 mg total) by mouth daily as needed for erectile dysfunction. 30 tablet 3   gabapentin (NEURONTIN) 300 MG capsule Take a 300 mg capsule three times a day for two weeks following surgery.Then take a 300 mg capsule two times a day for two weeks. Then take a 300 mg capsule once a day for two weeks. Then resume as taking prior to surgery. (Patient not taking: Reported on 05/11/2021) 84 capsule 0   HYDROmorphone (DILAUDID) 2 MG tablet Take 1-2 tablets (2-4 mg total) by mouth every 6 (six) hours as needed for severe pain or moderate pain. (Patient not taking: Reported on 05/11/2021) 42 tablet 0   methocarbamol (ROBAXIN) 500 MG tablet Take 1 tablet (500 mg total) by mouth every 6 (six) hours as needed for muscle spasms. (Patient not taking: Reported on  05/11/2021) 40 tablet 0   No facility-administered medications prior to visit.    ROS: Review of Systems  Constitutional:  Negative for appetite change, fatigue and unexpected weight change.  HENT:  Negative for congestion, nosebleeds, sneezing, sore throat and trouble swallowing.   Eyes:  Negative for itching and visual disturbance.  Respiratory:  Negative for cough.   Cardiovascular:  Negative for chest pain, palpitations and leg swelling.  Gastrointestinal:  Negative for abdominal distention, blood in stool, diarrhea and nausea.  Genitourinary:  Negative for frequency and hematuria.  Musculoskeletal:  Positive for arthralgias and gait problem. Negative for back pain, joint swelling and neck pain.  Skin:  Negative for rash.  Neurological:  Negative for dizziness, tremors, speech difficulty and weakness.  Psychiatric/Behavioral:  Negative for agitation, dysphoric mood and sleep disturbance. The patient is not nervous/anxious.    Objective:  BP (!) 148/80 (BP Location: Left Arm)   Pulse 89   Temp 98.2 F (36.8 C) (Oral)   Ht '5\' 11"'$  (1.803 m)   Wt 266 lb 12.8 oz (121 kg)   SpO2 97%   BMI 37.21 kg/m   BP Readings from Last 3 Encounters:  05/11/21 (!) 148/80  03/15/21 121/74  03/04/21 (!) 155/79    Wt Readings from Last 3 Encounters:  05/11/21 266 lb 12.8 oz (121  kg)  03/14/21 278 lb (126.1 kg)  03/04/21 278 lb (126.1 kg)    Physical Exam Constitutional:      General: He is not in acute distress.    Appearance: He is well-developed. He is obese.     Comments: NAD  Eyes:     Conjunctiva/sclera: Conjunctivae normal.     Pupils: Pupils are equal, round, and reactive to light.  Neck:     Thyroid: No thyromegaly.     Vascular: No JVD.  Cardiovascular:     Rate and Rhythm: Normal rate and regular rhythm.     Heart sounds: Normal heart sounds. No murmur heard.   No friction rub. No gallop.  Pulmonary:     Effort: Pulmonary effort is normal. No respiratory distress.      Breath sounds: Normal breath sounds. No wheezing or rales.  Chest:     Chest wall: No tenderness.  Abdominal:     General: Bowel sounds are normal. There is no distension.     Palpations: Abdomen is soft. There is no mass.     Tenderness: There is no abdominal tenderness. There is no guarding or rebound.  Musculoskeletal:        General: Tenderness present. Normal range of motion.     Cervical back: Normal range of motion.  Lymphadenopathy:     Cervical: No cervical adenopathy.  Skin:    General: Skin is warm and dry.     Findings: Rash present.  Neurological:     Mental Status: He is alert and oriented to person, place, and time.     Cranial Nerves: No cranial nerve deficit.     Motor: No abnormal muscle tone.     Coordination: Coordination normal.     Gait: Gait normal.     Deep Tendon Reflexes: Reflexes are normal and symmetric.  Psychiatric:        Behavior: Behavior normal.        Thought Content: Thought content normal.        Judgment: Judgment normal.  R knee w/a new scar post-op  Lab Results  Component Value Date   WBC 11.1 (H) 03/15/2021   HGB 14.3 03/15/2021   HCT 44.6 03/15/2021   PLT 113 (L) 03/15/2021   GLUCOSE 190 (H) 03/15/2021   CHOL 206 (H) 09/23/2019   TRIG 149 09/23/2019   HDL 61 09/23/2019   LDLDIRECT 146.2 10/07/2010   LDLCALC 119 (H) 09/23/2019   ALT 40 03/04/2021   AST 53 (H) 03/04/2021   NA 136 03/15/2021   K 4.5 03/15/2021   CL 104 03/15/2021   CREATININE 0.93 03/15/2021   BUN 18 03/15/2021   CO2 24 03/15/2021   TSH 3.320 05/22/2019   PSA 0.59 03/29/2018   INR 1.1 03/04/2021   HGBA1C 7.2 (H) 03/04/2021    No results found.  Assessment & Plan:     Walker Kehr, MD

## 2021-05-11 NOTE — Patient Instructions (Signed)
Try Valerian root for insomnia 

## 2021-05-11 NOTE — Assessment & Plan Note (Signed)
L TKR 12/17  R TKR 6/22 - pain: use Meloxicam 15 mg po qd prn

## 2021-05-11 NOTE — Assessment & Plan Note (Signed)
Cont w/wt loss 

## 2021-05-11 NOTE — Assessment & Plan Note (Signed)
Worse - DM2 Cont w/Metformin 1 a day RTC A1c in 2 mo Cont w/wt loss

## 2021-05-13 NOTE — Assessment & Plan Note (Signed)
On Crestor 

## 2021-06-02 DIAGNOSIS — H524 Presbyopia: Secondary | ICD-10-CM | POA: Diagnosis not present

## 2021-06-02 DIAGNOSIS — H52209 Unspecified astigmatism, unspecified eye: Secondary | ICD-10-CM | POA: Diagnosis not present

## 2021-06-02 DIAGNOSIS — H5203 Hypermetropia, bilateral: Secondary | ICD-10-CM | POA: Diagnosis not present

## 2021-06-07 ENCOUNTER — Encounter: Payer: Self-pay | Admitting: Gastroenterology

## 2021-07-12 ENCOUNTER — Ambulatory Visit: Payer: Medicare HMO | Admitting: Internal Medicine

## 2021-07-19 ENCOUNTER — Other Ambulatory Visit (INDEPENDENT_AMBULATORY_CARE_PROVIDER_SITE_OTHER): Payer: Medicare HMO

## 2021-07-19 ENCOUNTER — Other Ambulatory Visit: Payer: Self-pay

## 2021-07-19 DIAGNOSIS — R7309 Other abnormal glucose: Secondary | ICD-10-CM

## 2021-07-19 LAB — COMPREHENSIVE METABOLIC PANEL
ALT: 32 U/L (ref 0–53)
AST: 43 U/L — ABNORMAL HIGH (ref 0–37)
Albumin: 4.1 g/dL (ref 3.5–5.2)
Alkaline Phosphatase: 84 U/L (ref 39–117)
BUN: 15 mg/dL (ref 6–23)
CO2: 28 mEq/L (ref 19–32)
Calcium: 9.7 mg/dL (ref 8.4–10.5)
Chloride: 100 mEq/L (ref 96–112)
Creatinine, Ser: 0.93 mg/dL (ref 0.40–1.50)
GFR: 84.41 mL/min (ref >60.00)
Glucose, Bld: 163 mg/dL — ABNORMAL HIGH (ref 70–99)
Potassium: 4 mEq/L (ref 3.5–5.1)
Sodium: 136 mEq/L (ref 135–145)
Total Bilirubin: 1 mg/dL (ref 0.2–1.2)
Total Protein: 7.8 g/dL (ref 6.0–8.3)

## 2021-07-19 LAB — HEMOGLOBIN A1C: Hgb A1c MFr Bld: 7 % — ABNORMAL HIGH (ref 4.6–6.5)

## 2021-07-20 ENCOUNTER — Encounter: Payer: Self-pay | Admitting: Internal Medicine

## 2021-07-20 ENCOUNTER — Ambulatory Visit (INDEPENDENT_AMBULATORY_CARE_PROVIDER_SITE_OTHER): Payer: Medicare HMO | Admitting: Internal Medicine

## 2021-07-20 VITALS — BP 141/72 | HR 99 | Temp 98.7°F | Ht 71.0 in | Wt 278.2 lb

## 2021-07-20 DIAGNOSIS — E8881 Metabolic syndrome: Secondary | ICD-10-CM

## 2021-07-20 DIAGNOSIS — R7303 Prediabetes: Secondary | ICD-10-CM | POA: Diagnosis not present

## 2021-07-20 DIAGNOSIS — E538 Deficiency of other specified B group vitamins: Secondary | ICD-10-CM

## 2021-07-20 DIAGNOSIS — G5793 Unspecified mononeuropathy of bilateral lower limbs: Secondary | ICD-10-CM | POA: Diagnosis not present

## 2021-07-20 DIAGNOSIS — Z23 Encounter for immunization: Secondary | ICD-10-CM

## 2021-07-20 MED ORDER — MELOXICAM 15 MG PO TABS: 15.0000 mg | ORAL_TABLET | Freq: Every day | ORAL | 1 refills | Status: DC | PRN

## 2021-07-20 MED ORDER — METFORMIN HCL ER 750 MG PO TB24: 750.0000 mg | ORAL_TABLET | Freq: Every day | ORAL | 3 refills | Status: DC

## 2021-07-20 NOTE — Assessment & Plan Note (Signed)
Worse  Discussed  

## 2021-07-20 NOTE — Progress Notes (Signed)
Subjective:  Patient ID: Zachary Valdez, male    DOB: May 29, 1951  Age: 70 y.o. MRN: [id number]  CC: Follow-up (2 month f/u- Flu shot)   HPI Zachary Valdez presents for feet numbness and pain Drinking less C/o ankle pain - pt has Gabapentin F/u elevted glucose   Outpatient Medications Prior to Visit  Medication Sig Dispense Refill   Cholecalciferol (VITAMIN D) 125 MCG (5000 UT) CAPS Take 5,000 Units by mouth daily.     olmesartan (BENICAR) 40 MG tablet Take 1 tablet (40 mg total) by mouth daily. Annual appt due in Oct must see provider for future refills (Patient taking differently: Take 40 mg by mouth daily.) 90 tablet 1   pantoprazole (PROTONIX) 40 MG tablet Take 1 tablet (40 mg total) by mouth as needed. (Patient taking differently: Take 40 mg by mouth every other day.) 90 tablet 3   rosuvastatin (CRESTOR) 20 MG tablet Take 20 mg by mouth every other day.     vitamin B-12 (CYANOCOBALAMIN) 1000 MCG tablet Take 1 tablet (1,000 mcg total) by mouth daily. 100 tablet 3   vitamin C (ASCORBIC ACID) 500 MG tablet Take 500 mg by mouth daily.     meloxicam (MOBIC) 15 MG tablet Take 1 tablet (15 mg total) by mouth daily as needed for pain. 30 tablet 3   metFORMIN (GLUCOPHAGE) 500 MG tablet Take 1 tablet (500 mg total) by mouth daily with breakfast. 90 tablet 3   tadalafil (CIALIS) 20 MG tablet Take 1 tablet (20 mg total) by mouth daily as needed for erectile dysfunction. 30 tablet 3   No facility-administered medications prior to visit.    ROS: Review of Systems  Constitutional:  Negative for appetite change, fatigue and unexpected weight change.  HENT:  Negative for congestion, nosebleeds, sneezing, sore throat and trouble swallowing.   Eyes:  Negative for itching and visual disturbance.  Respiratory:  Negative for cough.   Cardiovascular:  Negative for chest pain, palpitations and leg swelling.  Gastrointestinal:  Negative for abdominal distention, blood in stool, diarrhea and nausea.   Genitourinary:  Negative for frequency and hematuria.  Musculoskeletal:  Positive for arthralgias and gait problem. Negative for back pain, joint swelling and neck pain.  Skin:  Negative for rash.  Neurological:  Positive for numbness. Negative for dizziness, tremors, speech difficulty and weakness.  Psychiatric/Behavioral:  Negative for agitation, dysphoric mood, sleep disturbance and suicidal ideas. The patient is not nervous/anxious.    Objective:  BP (!) 141/72 (BP Location: Left Arm)   Pulse 99   Temp 98.7 F (37.1 C) (Oral)   Ht 5\' 11"  (1.803 m)   Wt 278 lb 3.2 oz (126.2 kg)   SpO2 96%   BMI 38.80 kg/m   BP Readings from Last 3 Encounters:  07/20/21 (!) 141/72  05/11/21 (!) 148/80  03/15/21 121/74    Wt Readings from Last 3 Encounters:  07/20/21 278 lb 3.2 oz (126.2 kg)  05/11/21 266 lb 12.8 oz (121 kg)  03/14/21 278 lb (126.1 kg)    Physical Exam Constitutional:      General: He is not in acute distress.    Appearance: He is well-developed. He is obese.     Comments: NAD  Eyes:     Conjunctiva/sclera: Conjunctivae normal.     Pupils: Pupils are equal, round, and reactive to light.  Neck:     Thyroid: No thyromegaly.     Vascular: No JVD.  Cardiovascular:     Rate and Rhythm:  Normal rate and regular rhythm.     Heart sounds: Normal heart sounds. No murmur heard.   No friction rub. No gallop.  Pulmonary:     Effort: Pulmonary effort is normal. No respiratory distress.     Breath sounds: Normal breath sounds. No wheezing or rales.  Chest:     Chest wall: No tenderness.  Abdominal:     General: Bowel sounds are normal. There is no distension.     Palpations: Abdomen is soft. There is no mass.     Tenderness: There is no abdominal tenderness. There is no guarding or rebound.  Musculoskeletal:        General: No tenderness. Normal range of motion.     Cervical back: Normal range of motion.  Lymphadenopathy:     Cervical: No cervical adenopathy.  Skin:     General: Skin is warm and dry.     Findings: Lesion and rash present.  Neurological:     Mental Status: He is alert and oriented to person, place, and time.     Cranial Nerves: No cranial nerve deficit.     Motor: No abnormal muscle tone.     Coordination: Coordination normal.     Gait: Gait normal.     Deep Tendon Reflexes: Reflexes are normal and symmetric.  Psychiatric:        Behavior: Behavior normal.        Thought Content: Thought content normal.        Judgment: Judgment normal.  Excoriations and hyperpigmentation on distal shins Normal peripheral pulse  Lab Results  Component Value Date   WBC 11.1 (H) 03/15/2021   HGB 14.3 03/15/2021   HCT 44.6 03/15/2021   PLT 113 (L) 03/15/2021   GLUCOSE 163 (H) 07/19/2021   CHOL 206 (H) 09/23/2019   TRIG 149 09/23/2019   HDL 61 09/23/2019   LDLDIRECT 146.2 10/07/2010   LDLCALC 119 (H) 09/23/2019   ALT 32 07/19/2021   AST 43 (H) 07/19/2021   NA 136 07/19/2021   K 4.0 07/19/2021   CL 100 07/19/2021   CREATININE 0.93 07/19/2021   BUN 15 07/19/2021   CO2 28 07/19/2021   TSH 3.320 05/22/2019   PSA 0.59 03/29/2018   INR 1.1 03/04/2021   HGBA1C 7.0 (H) 07/19/2021    No results found.  Assessment & Plan:   Problem List Items Addressed This Visit     B12 deficiency    Cont on B12      Neuropathic pain of both feet    Worse.  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  Alcohol may be contributing. Reduce alcohol or stop drinking HOKA bondi 2EE [employer] recovery shoes      Prediabetes    Worse Discussed       Other Visit Diagnoses     Needs flu shot    -  Primary   Relevant Orders   Flu Vaccine QUAD High Dose(Fluad) (Completed)   Insulin resistance             Meds ordered this encounter  Medications   metFORMIN (GLUCOPHAGE XR) 750 MG 24 hr tablet    Sig: Take 1 tablet (750 mg total) by mouth daily with breakfast.    Dispense:  90 tablet    Refill:  3    meloxicam (MOBIC) 15 MG tablet    Sig: Take 1 tablet (15 mg total) by mouth daily as needed for pain.  Dispense:  90 tablet    Refill:  1      Follow-up: Return in about 2 months (around 09/19/2021) for a follow-up visit.  Walker Kehr, MD

## 2021-07-20 NOTE — Assessment & Plan Note (Signed)
Cont on B12 ?

## 2021-07-20 NOTE — Patient Instructions (Addendum)
Reduce alcohol or stop drinking HOKA bondi 2EE [employer] recovery shoes Diabetic socks TENs unit for pain

## 2021-07-20 NOTE — Assessment & Plan Note (Addendum)
Worse.  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  Alcohol may be contributing. Reduce alcohol or stop drinking HOKA bondi 2EE [employer] recovery shoes

## 2021-07-31 ENCOUNTER — Other Ambulatory Visit: Payer: Self-pay | Admitting: Internal Medicine

## 2021-08-14 ENCOUNTER — Other Ambulatory Visit: Payer: Self-pay | Admitting: Internal Medicine

## 2021-09-05 ENCOUNTER — Encounter: Payer: Self-pay | Admitting: Podiatry

## 2021-09-05 ENCOUNTER — Other Ambulatory Visit: Payer: Self-pay

## 2021-09-05 ENCOUNTER — Ambulatory Visit: Payer: Medicare HMO | Admitting: Podiatry

## 2021-09-05 DIAGNOSIS — M7741 Metatarsalgia, right foot: Secondary | ICD-10-CM | POA: Diagnosis not present

## 2021-09-05 DIAGNOSIS — M7742 Metatarsalgia, left foot: Secondary | ICD-10-CM | POA: Diagnosis not present

## 2021-09-05 NOTE — Progress Notes (Signed)
  Subjective:  Patient ID: Zachary Valdez, male    DOB: [birth year],   MRN: [id number]  Chief Complaint  Patient presents with   Numbness    Numbness in toes     70 y.o. male presents for conern of pain in the ball of both feet as well as occasional numbness. Patient relates when walking he has pain. Has tried inserts that have been helpful in the past and hoping to talk about custom inserts.  . Denies any other pedal complaints. Denies n/v/f/c.   Past Medical History:  Diagnosis Date   Allergic rhinitis    Anxiety    Diabetes mellitus without complication ([place])    GERD (gastroesophageal reflux disease)    Hyperlipidemia    Hyperplastic colon polyp 2006   Hypertension    OA (osteoarthritis)    knees,    Obesity    Sleep apnea    cpap-    Ulcerative esophagitis 2006   Dr. Fuller Plan   Vitamin B 12 deficiency     Objective:  Physical Exam: Vascular: DP/PT pulses 2/4 bilateral. CFT <3 seconds. Normal hair growth on digits. No edema.  Skin. No lacerations or abrasions bilateral feet.  Musculoskeletal: MMT 5/5 bilateral lower extremities in DF, PF, Inversion and Eversion. Deceased ROM in DF of ankle joint. No pain to palpation.  Neurological: Sensation intact to light touch.   Assessment:   1. Metatarsalgia of both feet      Plan:  Patient was evaluated and treated and all questions answered. Discussed metatarsalgia and treatment options with patient.   Discussed padding and offloading today.  Discussed shoes and inserts.  Discussed if pain does not improve may consider  MRI for further surgical planning.  Patient to return as needed.     Lorenda Peck, DPM

## 2021-09-19 ENCOUNTER — Ambulatory Visit: Payer: Medicare HMO | Admitting: Internal Medicine

## 2021-10-11 ENCOUNTER — Ambulatory Visit: Payer: Medicare HMO | Admitting: Internal Medicine

## 2021-10-27 IMAGING — DX DG CHEST 2V
3 series · 3 of 3 positions shown · non-contrast
Comparison: 02/17/2021

CLINICAL DATA: Follow-up abnormal chest x-ray, history
hypertension, former smoker, diabetes mellitus, GERD

EXAM:
CHEST - 2 VIEW

[chest pa]
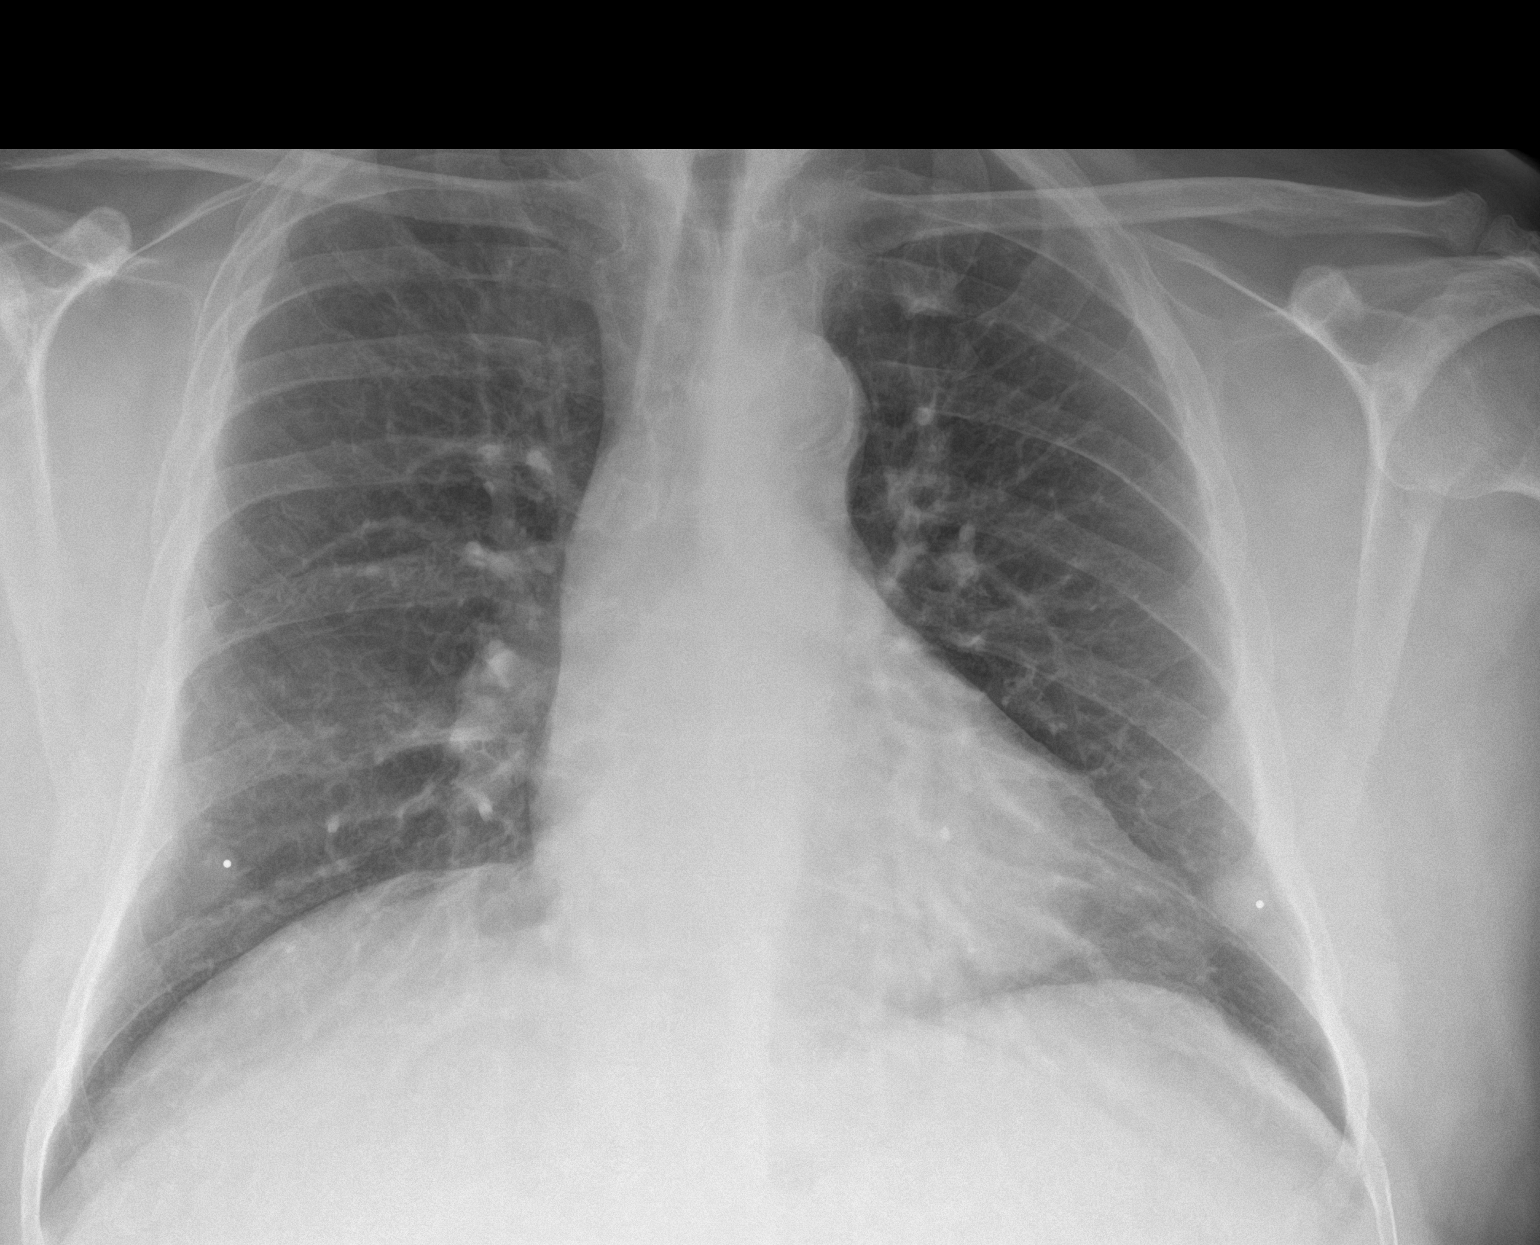

[chest lat (1 of 2)]
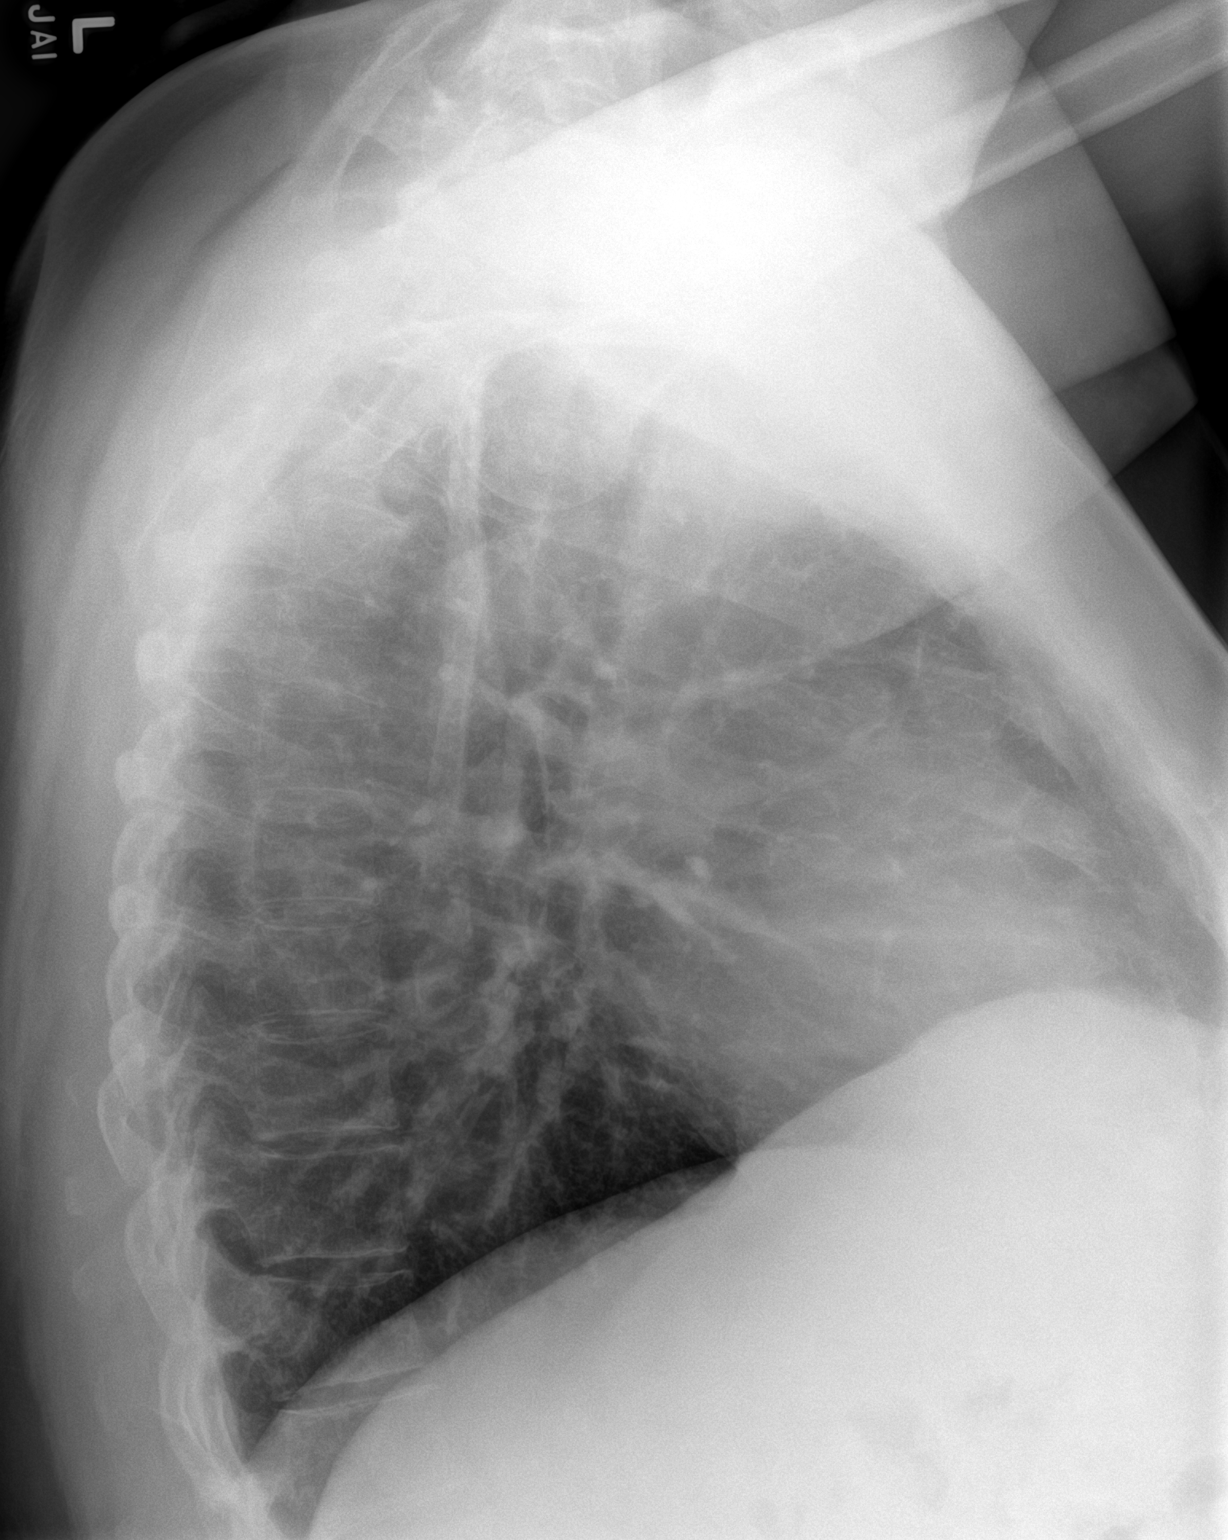

[chest lat (2 of 2)]
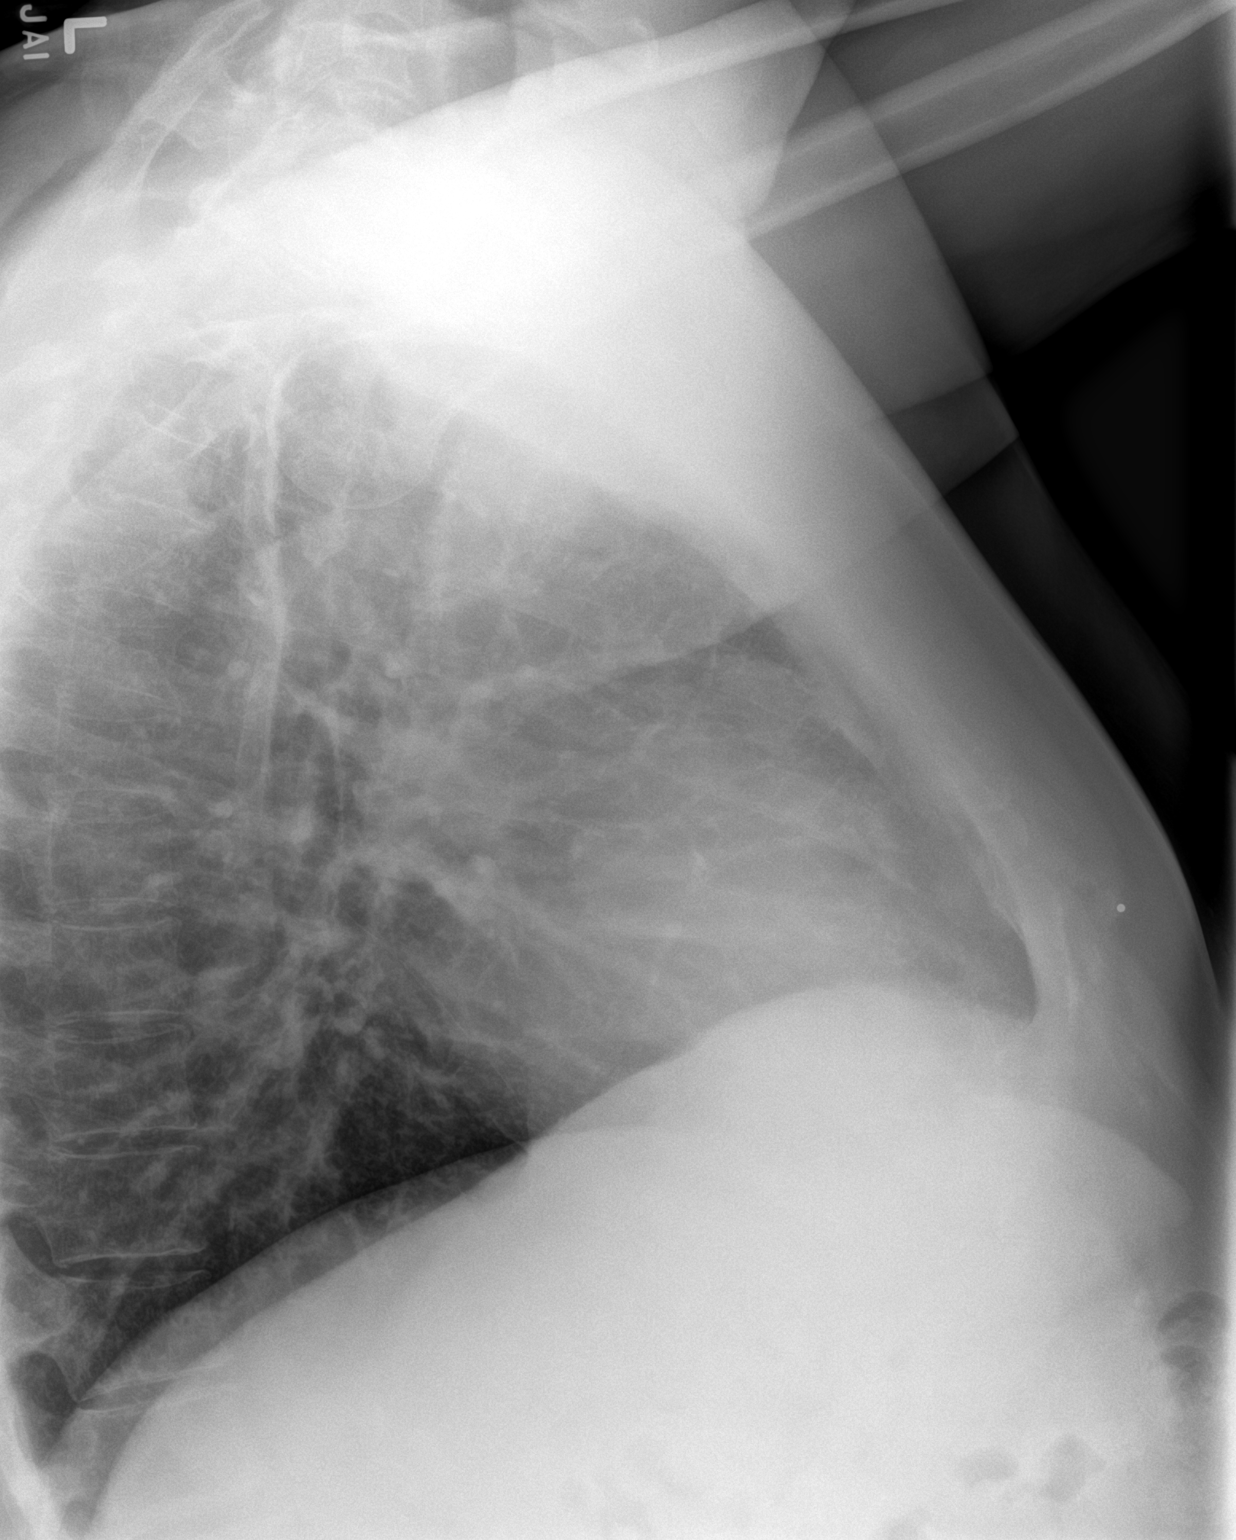

[3 of 3 positions shown; findings below may reference images not displayed]

FINDINGS: Upper normal heart size.

Mediastinal contours and pulmonary vascularity normal.

Atherosclerotic calcification aorta.

Lungs clear.

No pulmonary infiltrate, pleural effusion, or pneumothorax.

BILATERAL nipple shadows are present, confirmed with nipple markers;
the LEFT nipple marker corresponds to the nodular density seen on
the previous exam.

No acute osseous findings.
IMPRESSION: No acute abnormalities.

Nipple shadow on previous exam; no pulmonary nodule seen.

Aortic Atherosclerosis ([id number]).

## 2021-11-03 ENCOUNTER — Other Ambulatory Visit: Payer: Self-pay

## 2021-11-03 ENCOUNTER — Emergency Department (HOSPITAL_BASED_OUTPATIENT_CLINIC_OR_DEPARTMENT_OTHER): Payer: Medicare HMO | Admitting: Radiology

## 2021-11-03 ENCOUNTER — Emergency Department (HOSPITAL_BASED_OUTPATIENT_CLINIC_OR_DEPARTMENT_OTHER)
Admission: EM | Admit: 2021-11-03 | Discharge: 2021-11-03 | Disposition: A | Discharge status: Home / Self Care | Payer: Medicare HMO | Attending: Emergency Medicine | Admitting: Emergency Medicine

## 2021-11-03 ENCOUNTER — Encounter (HOSPITAL_BASED_OUTPATIENT_CLINIC_OR_DEPARTMENT_OTHER): Payer: Self-pay | Admitting: Emergency Medicine

## 2021-11-03 DIAGNOSIS — E119 Type 2 diabetes mellitus without complications: Secondary | ICD-10-CM | POA: Diagnosis not present

## 2021-11-03 DIAGNOSIS — I200 Unstable angina: Secondary | ICD-10-CM | POA: Diagnosis not present

## 2021-11-03 DIAGNOSIS — I10 Essential (primary) hypertension: Secondary | ICD-10-CM | POA: Insufficient documentation

## 2021-11-03 DIAGNOSIS — R0789 Other chest pain: Secondary | ICD-10-CM | POA: Diagnosis not present

## 2021-11-03 DIAGNOSIS — R1013 Epigastric pain: Secondary | ICD-10-CM | POA: Insufficient documentation

## 2021-11-03 DIAGNOSIS — R079 Chest pain, unspecified: Secondary | ICD-10-CM

## 2021-11-03 DIAGNOSIS — Z87891 Personal history of nicotine dependence: Secondary | ICD-10-CM | POA: Insufficient documentation

## 2021-11-03 DIAGNOSIS — K2970 Gastritis, unspecified, without bleeding: Secondary | ICD-10-CM | POA: Diagnosis not present

## 2021-11-03 DIAGNOSIS — Z79899 Other long term (current) drug therapy: Secondary | ICD-10-CM | POA: Diagnosis not present

## 2021-11-03 LAB — CBC
HCT: 45.9 % (ref 39.0–52.0)
Hemoglobin: 15 g/dL (ref 13.0–17.0)
MCH: 34.5 pg — ABNORMAL HIGH (ref 26.0–34.0)
MCHC: 32.7 g/dL (ref 30.0–36.0)
MCV: 105.5 fL — ABNORMAL HIGH (ref 80.0–100.0)
Platelets: 108 10*3/uL — ABNORMAL LOW (ref 150–400)
RBC: 4.35 MIL/uL (ref 4.22–5.81)
RDW: 12.6 % (ref 11.5–15.5)
WBC: 8.4 10*3/uL (ref 4.0–10.5)
nRBC: 0 % (ref 0.0–0.2)

## 2021-11-03 LAB — BASIC METABOLIC PANEL
Anion gap: 8 (ref 5–15)
BUN: 14 mg/dL (ref 8–23)
CO2: 24 mmol/L (ref 22–32)
Calcium: 9.5 mg/dL (ref 8.9–10.3)
Chloride: 103 mmol/L (ref 98–111)
Creatinine, Ser: 0.82 mg/dL (ref 0.61–1.24)
GFR, Estimated: >60 mL/min (ref >60)
Glucose, Bld: 248 mg/dL — ABNORMAL HIGH (ref 70–99)
Potassium: 4.2 mmol/L (ref 3.5–5.1)
Sodium: 135 mmol/L (ref 135–145)

## 2021-11-03 LAB — TROPONIN I (HIGH SENSITIVITY)
Troponin I (High Sensitivity): 4 ng/L (ref <18)
Troponin I (High Sensitivity): 4 ng/L (ref <18)

## 2021-11-03 MED ORDER — ALUM & MAG HYDROXIDE-SIMETH 200-200-20 MG/5ML PO SUSP
30.0000 mL | Freq: Once | ORAL | Status: AC
  Administered 2021-11-03: 30 mL via ORAL
  Filled 2021-11-03: qty 30

## 2021-11-03 MED ORDER — FAMOTIDINE 20 MG PO TABS: 20.0000 mg | ORAL_TABLET | Freq: Every day | ORAL | 0 refills | Status: DC

## 2021-11-03 MED ORDER — FAMOTIDINE IN NACL 20-0.9 MG/50ML-% IV SOLN
20.0000 mg | Freq: Once | INTRAVENOUS | Status: AC
  Administered 2021-11-03: 20 mg via INTRAVENOUS
  Filled 2021-11-03: qty 50

## 2021-11-03 MED ORDER — SUCRALFATE 1 G PO TABS: 1.0000 g | ORAL_TABLET | Freq: Three times a day (TID) | ORAL | 0 refills | Status: DC

## 2021-11-03 NOTE — ED Notes (Signed)
Back from xray

## 2021-11-03 NOTE — ED Notes (Signed)
Pt describes epigastric burning pain, has hx of GERD, takes meds for that every other day.  Pain was worse yesterday.  Denies any n/v or sweats. No radiation of pain anywhere.  Denies any pain at present time.

## 2021-11-03 NOTE — ED Triage Notes (Signed)
Pt reports substernal chest pain while walking yesterday, worsening through the evening but resolving after sleep last night.  Pt reports CP returned around 9am this morning while at rest.  Pt denies n/v, SOB, dizziness or other symptoms

## 2021-11-03 NOTE — Discharge Instructions (Addendum)
It was a pleasure caring for you today in the emergency department.  Please return to the emergency department for any worsening or worrisome symptoms.  Please follow up with cardiology.

## 2021-11-03 NOTE — ED Provider Notes (Signed)
°  Provider Note MRN:  [id number]  Arrival date & time: 11/03/21    ED Course and Medical Decision Making  Assumed care from Dr Pearline Cables at shift change.  See not from prior team for complete details, in brief: 71 year old male to the ED with chest pain, epigastrium with intermittent radiation to his chest.  Ongoing for approximately 1 day.  Reports that it feels like "indigestion."  Similar symptoms in the past, resolved with Maalox.  Does not follow cardiology.  EKG reviewed, no acute ischemia, troponin negative x2.  Chest x-ray stable.  Discussed observation versus outpatient follow-up with patient, admission was recommended, he prefers to follow-up with cardiology as an outpatient.  Patient elevated heart score.  Pain greatly improved following GI cocktail.  Is reasonable to have pt f/u outpatient with cardiology. Echo ordered.    The patient's chest pain is not suggestive of pulmonary embolus, cardiac ischemia, aortic dissection, pericarditis, myocarditis, pulmonary embolism, pneumothorax, pneumonia, Zoster, or esophageal perforation, or other serious etiology.  Historically not abrupt in onset, tearing or ripping, pulses symmetric. EKG nonspecific for ischemia/infarction. No dysrhythmias, brugada, WPW, prolonged QT noted.  Troponin negative x2. CXR reviewed and stable. Labs without demonstration of acute pathology unless otherwise noted above.  Detailed discussions were had with the patient regarding current findings, and is agreeable to follow up with their PCP or cardiologist within the next few days. The patient has been instructed to return immediately if the symptoms worsen in any way for re-evaluation. Patient verbalized understanding and is in agreement with current care plan. All questions answered prior to discharge.    Procedures  Final Clinical Impressions(s) / ED Diagnoses     ICD-10-CM   1. Nonspecific chest pain  R07.9     2. Gastritis, presence of bleeding unspecified,  unspecified chronicity, unspecified gastritis type  K29.70       ED Discharge Orders          Ordered    ECHOCARDIOGRAM COMPLETE        11/03/21 1627    sucralfate (CARAFATE) 1 g tablet  3 times daily with meals & bedtime        11/03/21 1627    famotidine (PEPCID) 20 MG tablet  Daily        11/03/21 1627    Ambulatory referral to Cardiology        11/03/21 1628              Discharge Instructions      It was a pleasure caring for you today in the emergency department.  Please return to the emergency department for any worsening or worrisome symptoms.  Please follow up with cardiology.           Wynona Dove A, DO 11/03/21 1629

## 2021-11-03 NOTE — ED Provider Notes (Signed)
[facility] EMERGENCY DEPT Provider Note   CSN: [id number] Arrival date & time: 11/03/21  1318     History  Chief Complaint  Patient presents with   Chest Pain    Zachary Valdez is a 71 y.o. male.  Pt is a 71 yo male with pmh of hyperlipidemia, hypertension, obesity, diabetes, previous smoking hx presenting for chest pain. Pt admits to epigastric abdominal pain radiating to the chest that started while walking yesterday. Chest pain has remained constant and non radiating. Feels like indigestion.   The history is provided by the patient. No language interpreter was used.  Chest Pain Associated symptoms: no abdominal pain, no back pain, no cough, no fever, no palpitations, no shortness of breath and no vomiting       Home Medications Prior to Admission medications   Medication Sig Start Date End Date Taking? Authorizing Provider  metFORMIN (GLUCOPHAGE XR) 750 MG 24 hr tablet Take 1 tablet (750 mg total) by mouth daily with breakfast. 07/20/21  Yes Plotnikov, Evie Lacks, MD  olmesartan (BENICAR) 40 MG tablet TAKE ONE TABLET BY MOUTH DAILY 08/15/21  Yes Plotnikov, Evie Lacks, MD  pantoprazole (PROTONIX) 40 MG tablet TAKE ONE TABLET BY MOUTH DAILY AS NEEDED 08/01/21  Yes Plotnikov, Evie Lacks, MD  rosuvastatin (CRESTOR) 20 MG tablet Take 20 mg by mouth every other day.   Yes [provider]  meloxicam (MOBIC) 15 MG tablet Take 1 tablet (15 mg total) by mouth daily as needed for pain. Patient not taking: Reported on 11/03/2021 07/20/21   Plotnikov, Evie Lacks, MD  vitamin B-12 (CYANOCOBALAMIN) 1000 MCG tablet Take 1 tablet (1,000 mcg total) by mouth daily. Patient not taking: Reported on 11/03/2021 03/12/15   Plotnikov, Evie Lacks, MD      Allergies    Hydrocodone-acetaminophen, Atorvastatin, Candesartan, Dilaudid [hydromorphone], Enalapril, Losartan, Nexium [esomeprazole], Tramadol, and Wellbutrin [bupropion]    Review of Systems   Review of Systems  Constitutional:   Negative for chills and fever.  HENT:  Negative for ear pain and sore throat.   Eyes:  Negative for pain and visual disturbance.  Respiratory:  Negative for cough and shortness of breath.   Cardiovascular:  Positive for chest pain. Negative for palpitations.  Gastrointestinal:  Negative for abdominal pain and vomiting.  Genitourinary:  Negative for dysuria and hematuria.  Musculoskeletal:  Negative for arthralgias and back pain.  Skin:  Negative for color change and rash.  Neurological:  Negative for seizures and syncope.  All other systems reviewed and are negative.  Physical Exam Updated Vital Signs BP (!) 145/79    Pulse 84    Temp 98.3 F (36.8 C) (Tympanic)    Resp 16    SpO2 95%  Physical Exam Vitals and nursing note reviewed.  Constitutional:      General: He is not in acute distress.    Appearance: He is well-developed.  HENT:     Head: Normocephalic and atraumatic.  Eyes:     Conjunctiva/sclera: Conjunctivae normal.  Cardiovascular:     Rate and Rhythm: Normal rate and regular rhythm.     Pulses:          Radial pulses are 2+ on the right side and 2+ on the left side.     Heart sounds: No murmur heard. Pulmonary:     Effort: Pulmonary effort is normal. No respiratory distress.     Breath sounds: Normal breath sounds.  Abdominal:     Palpations: Abdomen is soft.  Tenderness: There is no abdominal tenderness.  Musculoskeletal:        General: No swelling.     Cervical back: Neck supple.     Right lower leg: No edema.     Left lower leg: No edema.  Skin:    General: Skin is warm and dry.     Capillary Refill: Capillary refill takes less than 2 seconds.  Neurological:     Mental Status: He is alert.  Psychiatric:        Mood and Affect: Mood normal.    ED Results / Procedures / Treatments   Labs (all labs ordered are listed, but only abnormal results are displayed) Labs Reviewed  BASIC METABOLIC PANEL - Abnormal; Notable for the following components:       Result Value   Glucose, Bld 248 (*)    All other components within normal limits  CBC  TROPONIN I (HIGH SENSITIVITY)    EKG None  Radiology DG Chest 2 View  Result Date: 11/03/2021 CLINICAL DATA:  Chest and epigastric pain EXAM: CHEST - 2 VIEW COMPARISON:  05/11/2021 chest radiograph. FINDINGS: Stable cardiomediastinal silhouette with normal heart size. No pneumothorax. No pleural effusion. Lungs appear clear, with no acute consolidative airspace disease and no pulmonary edema. IMPRESSION: No active cardiopulmonary disease. Electronically Signed   By: Ilona Sorrel M.D.   On: 11/03/2021 14:18    Procedures Procedures    Medications Ordered in ED Medications - No data to display  ED Course/ Medical Decision Making/ A&P                           Medical Decision Making Amount and/or Complexity of Data Reviewed Labs: ordered. Radiology: ordered.  Risk OTC drugs. Prescription drug management.   2:38 PM 71 yo male with pmh of hyperlipidemia, hypertension, obesity, diabetes, previous smoking hx presenting for chest pain. ECG stable with no ST segment elevation or depression. Initial troponin wnl. Stable electrolytes and CXR.   Concern for unstable angina with heart score of 5 with MACE score of 13-16%. Risks factors include htn, hyperlipidemia, previous smoking hx, obesity, and DM. Pt recommended for admission. Risks of MACE discussed with patient in detail.   Signed out to oncoming provider while awaiting second troponin.         Final Clinical Impression(s) / ED Diagnoses Final diagnoses:  Unstable angina [facility])    Rx / DC Orders ED Discharge Orders     None         Lianne Cure, DO 28/78/67 1543

## 2021-11-07 ENCOUNTER — Encounter: Payer: Self-pay | Admitting: Cardiology

## 2021-11-07 ENCOUNTER — Other Ambulatory Visit: Payer: Self-pay

## 2021-11-07 ENCOUNTER — Ambulatory Visit: Payer: Medicare HMO | Admitting: Cardiology

## 2021-11-07 VITALS — BP 168/90 | HR 89 | Ht 71.0 in | Wt 280.0 lb

## 2021-11-07 DIAGNOSIS — I10 Essential (primary) hypertension: Secondary | ICD-10-CM | POA: Diagnosis not present

## 2021-11-07 DIAGNOSIS — I739 Peripheral vascular disease, unspecified: Secondary | ICD-10-CM | POA: Diagnosis not present

## 2021-11-07 DIAGNOSIS — I2510 Atherosclerotic heart disease of native coronary artery without angina pectoris: Secondary | ICD-10-CM | POA: Diagnosis not present

## 2021-11-07 DIAGNOSIS — R079 Chest pain, unspecified: Secondary | ICD-10-CM | POA: Diagnosis not present

## 2021-11-07 DIAGNOSIS — M79605 Pain in left leg: Secondary | ICD-10-CM

## 2021-11-07 DIAGNOSIS — E66812 Obesity, class 2: Secondary | ICD-10-CM

## 2021-11-07 DIAGNOSIS — M79604 Pain in right leg: Secondary | ICD-10-CM | POA: Diagnosis not present

## 2021-11-07 DIAGNOSIS — Z6835 Body mass index (BMI) 35.0-35.9, adult: Secondary | ICD-10-CM

## 2021-11-07 DIAGNOSIS — E785 Hyperlipidemia, unspecified: Secondary | ICD-10-CM

## 2021-11-07 MED ORDER — METOPROLOL TARTRATE 100 MG PO TABS: ORAL_TABLET | ORAL | 0 refills | Status: DC

## 2021-11-07 MED ORDER — NITROGLYCERIN 0.4 MG SL SUBL: 0.4000 mg | SUBLINGUAL_TABLET | SUBLINGUAL | 3 refills | Status: DC | PRN

## 2021-11-07 NOTE — Patient Instructions (Addendum)
Medication Instructions:  Your physician has recommended you make the following change in your medication:  START: Nitroglycerin 0.4 mg take one tablet by mouth every 5 minutes up to three times as needed for chest pain  *If you need a refill on your cardiac medications before your next appointment, please call your pharmacy*   Lab Work: Your physician recommends that you return for lab work in:  TODAY: BMET, [place] If you have labs (blood work) drawn today and your tests are completely normal, you will receive your results only by: [place] (if you have [place]) OR A paper copy in the mail If you have any lab test that is abnormal or we need to change your treatment, we will call you to review the results.   Testing/Procedures: Your physician has requested that you have an ankle brachial index (ABI). During this test an ultrasound and blood pressure cuff are used to evaluate the arteries that supply the arms and legs with blood. Allow thirty minutes for this exam. There are no restrictions or special instructions.     Your cardiac CT will be scheduled at one of the below locations:   [facility] [address] [place], [place] [address] [phone]   If scheduled at [facility], please arrive at the [facility] main entrance (entrance A) of [facility] 30 minutes prior to test start time. You can use the FREE valet parking offered at the main entrance (encouraged to control the heart rate for the test) Proceed to the [facility] Radiology Department (first floor) to check-in and test prep.  Please follow these instructions carefully (unless otherwise directed):  Hold all erectile dysfunction medications at least 3 days (72 hrs) prior to test.  On the Night Before the Test: Be sure to Drink plenty of water. Do not consume any caffeinated/decaffeinated beverages or chocolate 12 hours prior to your test. Do not take any antihistamines 12 hours  prior to your test.  On the Day of the Test: Drink plenty of water until 1 hour prior to the test. Do not eat any food 4 hours prior to the test. You may take your regular medications prior to the test.  Take metoprolol (Lopressor) two hours prior to test. FEMALES- please wear underwire-free bra if available, avoid dresses & tight clothing  After the Test: Drink plenty of water. After receiving IV contrast, you may experience a mild flushed feeling. This is normal. On occasion, you may experience a mild rash up to 24 hours after the test. This is not dangerous. If this occurs, you can take Benadryl 25 mg and increase your fluid intake. If you experience trouble breathing, this can be serious. If it is severe call 911 IMMEDIATELY. If it is mild, please call our office. If you take any of these medications: Glipizide/Metformin, Avandament, Glucavance, please do not take 48 hours after completing test unless otherwise instructed.  We will call to schedule your test 2-4 weeks out understanding that some insurance companies will need an authorization prior to the service being performed.   For non-scheduling related questions, please contact the cardiac imaging nurse navigator should you have any questions/concerns: Marchia Bond, Cardiac Imaging Nurse Navigator Gordy Clement, Cardiac Imaging Nurse Navigator [facility] Direct Office Dial: [phone]   For scheduling needs, including cancellations and rescheduling, please call [place], [phone].    Follow-Up: At [facility], you and your health needs are our priority.  As part of our  continuing mission to provide you with exceptional heart care, we have created designated Provider Care Teams.  These Care Teams include your primary Cardiologist (physician) and Advanced Practice Providers (APPs -  Physician Assistants and Nurse Practitioners) who all work together to provide you with the care you need, when  you need it.  We recommend signing up for the patient portal called "MyChart".  Sign up information is provided on this After Visit Summary.  MyChart is used to connect with patients for Virtual Visits (Telemedicine).  Patients are able to view lab/test results, encounter notes, upcoming appointments, etc.  Non-urgent messages can be sent to your provider as well.   To learn more about what you can do with MyChart, go to [email or web address].    Your next appointment:   3 month(s)  The format for your next appointment:   In Person  Provider:   Berniece Salines, DO     Other Instructions

## 2021-11-07 NOTE — Progress Notes (Signed)
Cardiology Office Note:    Date:  11/07/2021   ID:  Zachary Valdez, DOB 10/28/1950, MRN [id number]  PCP:  Cassandria Anger, MD  Cardiologist:  Zachary Salines, DO  Electrophysiologist:  None   Referring MD: Zachary Sparrow, DO   " I am having chest pain"   History of Present Illness:    Zachary Valdez is a 71 y.o. male with a hx of type 2 diabetes mellitus: Metformin most recent hemoglobin A1c in October 2022 was 7, hypertension, hyperlipidemia, coronary calcium score back in 2020 showed evidence of coronary calcification shows calcium score of 551 84th percentile and morbid obesity  The patient is here today to be evaluated for chest pain.  He tells me that last week he experienced intermittent chest discomfort which he felt felt more like heartburn.  He notes that it was midsternal epigastric area where he felt a burning sensation.  He tells me he came on lasted for few minutes.  Because of the frequency of the sensation he went to the emergency department to be evaluated.  He was seen on November 03, 2021 at the emergency department at [facility].  During that time his troponins were noted to be negative.  An echocardiogram was ordered. He was asked to see cardiology.  Since Thursday he has not had any more chest discomfort he tells me.   Past Medical History:  Diagnosis Date   Allergic rhinitis    Anxiety    Diabetes mellitus without complication ([place])    GERD (gastroesophageal reflux disease)    Hyperlipidemia    Hyperplastic colon polyp 2006   Hypertension    OA (osteoarthritis)    knees,    Obesity    Sleep apnea    cpap-    Ulcerative esophagitis 2006   Dr. Fuller Plan   Vitamin B 12 deficiency     Past Surgical History:  Procedure Laterality Date   KNEE ARTHROSCOPY     Right   TOTAL KNEE ARTHROPLASTY Left 09/07/2014   Procedure: LEFT TOTAL KNEE ARTHROPLASTY;  Surgeon: Gearlean Alf, MD;  Location: [facility];  Service: Orthopedics;  Laterality: Left;   TOTAL  KNEE ARTHROPLASTY Right 03/14/2021   Procedure: TOTAL KNEE ARTHROPLASTY;  Surgeon: Gaynelle Arabian, MD;  Location: [facility];  Service: Orthopedics;  Laterality: Right;  24min    Current Medications: Current Meds  Medication Sig   famotidine (PEPCID) 20 MG tablet Take 1 tablet (20 mg total) by mouth daily for 10 days.   metFORMIN (GLUCOPHAGE XR) 750 MG 24 hr tablet Take 1 tablet (750 mg total) by mouth daily with breakfast.   metoprolol tartrate (LOPRESSOR) 100 MG tablet Take 2 hours prior to CT   nitroGLYCERIN (NITROSTAT) 0.4 MG SL tablet Place 1 tablet (0.4 mg total) under the tongue every 5 (five) minutes as needed for chest pain.   olmesartan (BENICAR) 40 MG tablet TAKE ONE TABLET BY MOUTH DAILY   pantoprazole (PROTONIX) 40 MG tablet TAKE ONE TABLET BY MOUTH DAILY AS NEEDED   rosuvastatin (CRESTOR) 20 MG tablet Take 20 mg by mouth every other day.   sucralfate (CARAFATE) 1 g tablet Take 1 tablet (1 g total) by mouth 4 (four) times daily -  with meals and at bedtime for 7 days.     Allergies:   Hydrocodone-acetaminophen, Atorvastatin, Candesartan, Dilaudid [hydromorphone], Enalapril, Losartan, Nexium [esomeprazole], Tramadol, and Wellbutrin [bupropion]   Social History   Socioeconomic History   Marital status: Divorced    Spouse  name: Not on file   Number of children: Not on file   Years of education: Not on file   Highest education level: Not on file  Occupational History   Occupation: retired  Tobacco Use   Smoking status: Former    Packs/day: 1.50    Years: 15.00    Pack years: 22.50    Types: Cigarettes    Quit date: 10/02/1997    Years since quitting: 24.1   Smokeless tobacco: Never  Vaping Use   Vaping Use: Never used  Substance and Sexual Activity   Alcohol use: Yes    Alcohol/week: 14.0 standard drinks    Types: 14 Glasses of wine per week   Drug use: No   Sexual activity: Not Currently  Other Topics Concern   Not on file  Social History Narrative   Daily  caffeine- 2-3 per day   Social Determinants of Health   Financial Resource Strain: Low Risk    Difficulty of Paying Living Expenses: Not hard at all  Food Insecurity: No Food Insecurity   Worried About Charity fundraiser in the Last Year: Never true   Ran Out of Food in the Last Year: Never true  Transportation Needs: No Transportation Needs   Lack of Transportation (Medical): No   Lack of Transportation (Non-Medical): No  Physical Activity: Inactive   Days of Exercise per Week: 0 days   Minutes of Exercise per Session: 0 min  Stress: No Stress Concern Present   Feeling of Stress : Not at all  Social Connections: Moderately Integrated   Frequency of Communication with Friends and Family: More than three times a week   Frequency of Social Gatherings with Friends and Family: More than three times a week   Attends Religious Services: 1 to 4 times per year   Active Member of [employer] or Organizations: No   Attends Music therapist: 1 to 4 times per year   Marital Status: Never married     Family History: The patient's family history includes Breast cancer in his mother; Hypertension in his mother and another family member. There is no history of Colon cancer.  ROS:   Review of Systems  Constitution: Negative for decreased appetite, fever and weight gain.  HENT: Negative for congestion, ear discharge, hoarse voice and sore throat.   Eyes: Negative for discharge, redness, vision loss in right eye and visual halos.  Cardiovascular: Negative for chest pain, dyspnea on exertion, leg swelling, orthopnea and palpitations.  Respiratory: Negative for cough, hemoptysis, shortness of breath and snoring.   Endocrine: Negative for heat intolerance and polyphagia.  Hematologic/Lymphatic: Negative for bleeding problem. Does not bruise/bleed easily.  Skin: Negative for flushing, nail changes, rash and suspicious lesions.  Musculoskeletal: Negative for arthritis, joint pain, muscle  cramps, myalgias, neck pain and stiffness.  Gastrointestinal: Negative for abdominal pain, bowel incontinence, diarrhea and excessive appetite.  Genitourinary: Negative for decreased libido, genital sores and incomplete emptying.  Neurological: Negative for brief paralysis, focal weakness, headaches and loss of balance.  Psychiatric/Behavioral: Negative for altered mental status, depression and suicidal ideas.  Allergic/Immunologic: Negative for HIV exposure and persistent infections.    EKGs/Labs/Other Studies Reviewed:    The following studies were reviewed today:   EKG:  The ekg ordered today demonstrates sinus rhythm, heart rate 89 bpm.   CT calcium score 10/17/2018 EXAM: Coronary Calcium Score   TECHNIQUE: The patient was scanned on a [employer]. Axial non-contrast 3 mm slices were carried out  through the heart. The data set was analyzed on a dedicated work station and scored using the [place].   FINDINGS: Non-cardiac: See separate report from [facility].   Ascending Aorta: Normal size, mild diffuse calcifications in the aortic root and aortic valve.   Pericardium: Normal.   Coronary arteries: Normal origin.   IMPRESSION: Coronary calcium score of 551. This was 61 percentile for age and sex matched control.     Electronically Signed   By: Ena Dawley  Recent Labs: 07/19/2021: ALT 32 11/03/2021: BUN 14; Creatinine, Ser 0.82; Hemoglobin 15.0; Platelets 108; Potassium 4.2; Sodium 135  Recent Lipid Panel    Component Value Date/Time   CHOL 206 (H) 09/23/2019 0948   TRIG 149 09/23/2019 0948   HDL 61 09/23/2019 0948   CHOLHDL 4 03/29/2018 0753   VLDL 22.2 03/29/2018 0753   LDLCALC 119 (H) 09/23/2019 0948   LDLDIRECT 146.2 10/07/2010 0814    Physical Exam:    VS:  BP (!) 168/90    Pulse 89    Ht 5\' 11"  (1.803 m)    Wt 280 lb (127 kg)    SpO2 96%    BMI 39.05 kg/m     Wt Readings from Last 3 Encounters:  11/07/21 280 lb (127  kg)  07/20/21 278 lb 3.2 oz (126.2 kg)  05/11/21 266 lb 12.8 oz (121 kg)     GEN: Well nourished, well developed in no acute distress HEENT: Normal NECK: No JVD; No carotid bruits LYMPHATICS: No lymphadenopathy CARDIAC: S1S2 noted,RRR, no murmurs, rubs, gallops RESPIRATORY:  Clear to auscultation without rales, wheezing or rhonchi  ABDOMEN: Soft, non-tender, non-distended, +bowel sounds, no guarding. EXTREMITIES: No edema, No cyanosis, no clubbing MUSCULOSKELETAL:  No deformity  SKIN: Warm and dry NEUROLOGIC:  Alert and oriented x 3, non-focal PSYCHIATRIC:  Normal affect, good insight  ASSESSMENT:    1. Chest pain, unspecified type   2. Pain in both lower extremities   3. Hypertension, unspecified type   4. Hyperlipidemia, unspecified hyperlipidemia type   5. Class 2 severe obesity due to excess calories with serious comorbidity and body mass index (BMI) of 35.0 to 35.9 in adult ([place])   6. Left leg claudication ([place])   7. Right leg claudication (HCC)    PLAN:     The symptoms chest pain is concerning, this patient does have intermediate risk for coronary artery disease and at this time I would like to pursue an ischemic evaluation in this patient.  Shared decision a coronary CTA at this time is appropriate.  I have discussed with the patient about the testing.  The patient has no IV contrast allergy and is agreeable to proceed with this test.  His blood pressure is elevated in the office today.  Which was manually taken by me.  He tells me at home his blood pressure is within normal.  I have asked the patient take his blood pressure daily.  Bring that information to his next visit.  The patient is in agreement with the above plan. The patient left the office in stable condition.  The patient will follow up in 3 months or sooner if needed.   Medication Adjustments/Labs and Tests Ordered: Current medicines are reviewed at length with the patient today.  Concerns regarding  medicines are outlined above.  Orders Placed This Encounter  Procedures   CT CORONARY MORPH W/CTA COR W/SCORE W/CA W/CM &/OR WO/CM   Basic Metabolic Panel (BMET)   Magnesium   EKG 12-Lead  VAS [place] ABI WITH/WO TBI   Meds ordered this encounter  Medications   nitroGLYCERIN (NITROSTAT) 0.4 MG SL tablet    Sig: Place 1 tablet (0.4 mg total) under the tongue every 5 (five) minutes as needed for chest pain.    Dispense:  90 tablet    Refill:  3   metoprolol tartrate (LOPRESSOR) 100 MG tablet    Sig: Take 2 hours prior to CT    Dispense:  1 tablet    Refill:  0    Patient Instructions  Medication Instructions:  Your physician has recommended you make the following change in your medication:  START: Nitroglycerin 0.4 mg take one tablet by mouth every 5 minutes up to three times as needed for chest pain  *If you need a refill on your cardiac medications before your next appointment, please call your pharmacy*   Lab Work: Your physician recommends that you return for lab work in:  TODAY: BMET, [place] If you have labs (blood work) drawn today and your tests are completely normal, you will receive your results only by: [place] (if you have [place]) OR A paper copy in the mail If you have any lab test that is abnormal or we need to change your treatment, we will call you to review the results.   Testing/Procedures: Your physician has requested that you have an ankle brachial index (ABI). During this test an ultrasound and blood pressure cuff are used to evaluate the arteries that supply the arms and legs with blood. Allow thirty minutes for this exam. There are no restrictions or special instructions.     Your cardiac CT will be scheduled at one of the below locations:   [facility] [address] [place], [place] [address] [phone]   If scheduled at [facility], please arrive at the [facility] main entrance (entrance A) of [facility] 30  minutes prior to test start time. You can use the FREE valet parking offered at the main entrance (encouraged to control the heart rate for the test) Proceed to the [facility] Radiology Department (first floor) to check-in and test prep.  Please follow these instructions carefully (unless otherwise directed):  Hold all erectile dysfunction medications at least 3 days (72 hrs) prior to test.  On the Night Before the Test: Be sure to Drink plenty of water. Do not consume any caffeinated/decaffeinated beverages or chocolate 12 hours prior to your test. Do not take any antihistamines 12 hours prior to your test.  On the Day of the Test: Drink plenty of water until 1 hour prior to the test. Do not eat any food 4 hours prior to the test. You may take your regular medications prior to the test.  Take metoprolol (Lopressor) two hours prior to test. FEMALES- please wear underwire-free bra if available, avoid dresses & tight clothing  After the Test: Drink plenty of water. After receiving IV contrast, you may experience a mild flushed feeling. This is normal. On occasion, you may experience a mild rash up to 24 hours after the test. This is not dangerous. If this occurs, you can take Benadryl 25 mg and increase your fluid intake. If you experience trouble breathing, this can be serious. If it is severe call 911 IMMEDIATELY. If it is mild, please call our office. If you take any of these medications: Glipizide/Metformin, Avandament, Glucavance, please do not take 48 hours after completing test unless otherwise instructed.  We will call to schedule  your test 2-4 weeks out understanding that some insurance companies will need an authorization prior to the service being performed.   For non-scheduling related questions, please contact the cardiac imaging nurse navigator should you have any questions/concerns: Marchia Bond, Cardiac Imaging Nurse Navigator Gordy Clement, Cardiac Imaging Nurse  Navigator [facility] [facility] Direct Office Dial: [phone]   For scheduling needs, including cancellations and rescheduling, please call [place], [phone].    Follow-Up: At [facility], you and your health needs are our priority.  As part of our continuing mission to provide you with exceptional heart care, we have created designated Provider Care Teams.  These Care Teams include your primary Cardiologist (physician) and Advanced Practice Providers (APPs -  Physician Assistants and Nurse Practitioners) who all work together to provide you with the care you need, when you need it.  We recommend signing up for the patient portal called "MyChart".  Sign up information is provided on this After Visit Summary.  MyChart is used to connect with patients for Virtual Visits (Telemedicine).  Patients are able to view lab/test results, encounter notes, upcoming appointments, etc.  Non-urgent messages can be sent to your provider as well.   To learn more about what you can do with MyChart, go to [email or web address].    Your next appointment:   3 month(s)  The format for your next appointment:   In Person  Provider:   Berniece Salines, DO     Other Instructions     Adopting a Healthy Lifestyle.  Know what a healthy weight is for you (roughly BMI <25) and aim to maintain this   Aim for 7+ servings of fruits and vegetables daily   65-80+ fluid ounces of water or unsweet tea for healthy kidneys   Limit to max 1 drink of alcohol per day; avoid smoking/tobacco   Limit animal fats in diet for cholesterol and heart health - choose grass fed whenever available   Avoid highly processed foods, and foods high in saturated/trans fats   Aim for low stress - take time to unwind and care for your mental health   Aim for 150 min of moderate intensity exercise weekly for heart health, and weights twice weekly for bone health   Aim for 7-9 hours of sleep daily   When it  comes to diets, agreement about the perfect plan isnt easy to find, even among the experts. Experts at the [place] developed an idea known as the Healthy Eating Plate. Just imagine a plate divided into logical, healthy portions.   The emphasis is on diet quality:   Load up on vegetables and fruits - one-half of your plate: Aim for color and variety, and remember that potatoes dont count.   Go for whole grains - one-quarter of your plate: Whole wheat, barley, wheat berries, quinoa, oats, brown rice, and foods made with them. If you want pasta, go with whole wheat pasta.   Protein power - one-quarter of your plate: Fish, chicken, beans, and nuts are all healthy, versatile protein sources. Limit red meat.   The diet, however, does go beyond the plate, offering a few other suggestions.   Use healthy plant oils, such as olive, canola, soy, corn, sunflower and peanut. Check the labels, and avoid partially hydrogenated oil, which have unhealthy trans fats.   If youre thirsty, drink water. Coffee and tea are good in moderation, but skip sugary drinks and limit milk and dairy products to one or two  daily servings.   The type of carbohydrate in the diet is more important than the amount. Some sources of carbohydrates, such as vegetables, fruits, whole grains, and beans-are healthier than others.   Finally, stay active  Signed, Zachary Salines, DO  11/07/2021 11:17 AM    [place]

## 2021-11-08 ENCOUNTER — Telehealth (HOSPITAL_COMMUNITY): Payer: Self-pay | Admitting: Emergency Medicine

## 2021-11-08 NOTE — Telephone Encounter (Signed)
Reaching out to patient to offer assistance regarding upcoming cardiac imaging study; pt verbalizes understanding of appt date/time, parking situation and where to check in, pre-test NPO status and medications ordered, and verified current allergies; name and call back number provided for further questions should they arise Marchia Bond RN Navigator Cardiac Imaging [facility] [phone] office [phone] cell  R arm easier than L 100mg  metoprolol tartrate Arrival 900

## 2021-11-10 ENCOUNTER — Ambulatory Visit (HOSPITAL_COMMUNITY)
Admission: RE | Admit: 2021-11-10 | Discharge: 2021-11-10 | Disposition: A | Discharge status: Home / Self Care | Payer: Medicare HMO | Source: Ambulatory Visit | Attending: Cardiology | Admitting: Cardiology

## 2021-11-10 ENCOUNTER — Other Ambulatory Visit: Payer: Self-pay | Admitting: Internal Medicine

## 2021-11-10 ENCOUNTER — Ambulatory Visit (HOSPITAL_COMMUNITY)
Admission: RE | Admit: 2021-11-10 | Discharge: 2021-11-10 | Disposition: A | Discharge status: Home / Self Care | Payer: Medicare HMO | Source: Ambulatory Visit | Attending: Internal Medicine | Admitting: Internal Medicine

## 2021-11-10 ENCOUNTER — Encounter (HOSPITAL_COMMUNITY): Payer: Self-pay

## 2021-11-10 ENCOUNTER — Other Ambulatory Visit: Payer: Self-pay

## 2021-11-10 DIAGNOSIS — I700 Atherosclerosis of aorta: Secondary | ICD-10-CM | POA: Diagnosis not present

## 2021-11-10 DIAGNOSIS — R931 Abnormal findings on diagnostic imaging of heart and coronary circulation: Secondary | ICD-10-CM | POA: Diagnosis not present

## 2021-11-10 DIAGNOSIS — R079 Chest pain, unspecified: Secondary | ICD-10-CM | POA: Insufficient documentation

## 2021-11-10 DIAGNOSIS — I2510 Atherosclerotic heart disease of native coronary artery without angina pectoris: Secondary | ICD-10-CM | POA: Diagnosis not present

## 2021-11-10 MED ORDER — IOHEXOL 350 MG/ML SOLN
95.0000 mL | Freq: Once | INTRAVENOUS | Status: AC | PRN
  Administered 2021-11-10: 95 mL via INTRAVENOUS

## 2021-11-10 MED ORDER — NITROGLYCERIN 0.4 MG SL SUBL
0.8000 mg | SUBLINGUAL_TABLET | Freq: Once | SUBLINGUAL | Status: AC
  Administered 2021-11-10: 0.8 mg via SUBLINGUAL

## 2021-11-10 MED ORDER — NITROGLYCERIN 0.4 MG SL SUBL
SUBLINGUAL_TABLET | SUBLINGUAL | Status: AC
  Filled 2021-11-10: qty 2

## 2021-11-10 NOTE — Progress Notes (Unsigned)
Please send CT for FFR - thanks, Dr. Debara Pickett

## 2021-11-11 ENCOUNTER — Ambulatory Visit (HOSPITAL_COMMUNITY)
Admission: RE | Admit: 2021-11-11 | Discharge: 2021-11-11 | Disposition: A | Discharge status: Home / Self Care | Payer: Medicare HMO | Source: Ambulatory Visit | Attending: Internal Medicine | Admitting: Internal Medicine

## 2021-11-11 ENCOUNTER — Telehealth (INDEPENDENT_AMBULATORY_CARE_PROVIDER_SITE_OTHER): Payer: Medicare HMO | Admitting: Cardiology

## 2021-11-11 ENCOUNTER — Encounter: Payer: Self-pay | Admitting: Cardiology

## 2021-11-11 VITALS — BP 141/76 | HR 75 | Ht 71.0 in | Wt 276.0 lb

## 2021-11-11 DIAGNOSIS — R079 Chest pain, unspecified: Secondary | ICD-10-CM | POA: Diagnosis not present

## 2021-11-11 DIAGNOSIS — Z79899 Other long term (current) drug therapy: Secondary | ICD-10-CM

## 2021-11-11 DIAGNOSIS — I2510 Atherosclerotic heart disease of native coronary artery without angina pectoris: Secondary | ICD-10-CM

## 2021-11-11 DIAGNOSIS — I700 Atherosclerosis of aorta: Secondary | ICD-10-CM | POA: Diagnosis not present

## 2021-11-11 DIAGNOSIS — R931 Abnormal findings on diagnostic imaging of heart and coronary circulation: Secondary | ICD-10-CM

## 2021-11-11 NOTE — Progress Notes (Signed)
Virtual Visit via Video Note   This visit type was conducted due to national recommendations for restrictions regarding the COVID-19 Pandemic (e.g. social distancing) in an effort to limit this patient's exposure and mitigate transmission in our community.  Due to his co-morbid illnesses, this patient is at least at moderate risk for complications without adequate follow up.  This format is felt to be most appropriate for this patient at this time.  All issues noted in this document were discussed and addressed.  A limited physical exam was performed with this format.  Please refer to the patient's chart for his consent to telehealth for [facility].      Date:  11/11/2021   ID:  Zachary Valdez, DOB 1950/11/29, MRN [id number]  Patient Location: Home Provider Location: Office/Clinic  PCP:  Tresa Garter, MD  Cardiologist:  Thomasene Ripple, DO  Electrophysiologist:  None   Evaluation Performed:  Follow-Up Visit  Chief Complaint:  " I am ok"  History of Present Illness:    Zachary Valdez is a 71 y.o. male with type 2 diabetes mellitus, hypertension, hyperlipidemia, recent coronary artery disease seen on his coronary CT scan, morbid obesity, is here today to discuss his testing result.  I saw the patient on November 07, 2020 at that time he was experiencing to me chest discomfort.  I recommended he undergo a coronary CT scan and he was agreeable.  Done yesterday he is here today to discuss the result given the fact that he there were concern for severe coronary artery disease.  The patient does not have symptoms concerning for COVID-19 infection (fever, chills, cough, or new shortness of breath).    Past Medical History:  Diagnosis Date   Allergic rhinitis    Anxiety    Diabetes mellitus without complication (HCC)    GERD (gastroesophageal reflux disease)    Hyperlipidemia    Hyperplastic colon polyp 2006   Hypertension    OA (osteoarthritis)    knees,    Obesity    Sleep  apnea    cpap-    Ulcerative esophagitis 2006   Dr. Russella Dar   Vitamin B 12 deficiency    Past Surgical History:  Procedure Laterality Date   KNEE ARTHROSCOPY     Right   TOTAL KNEE ARTHROPLASTY Left 09/07/2014   Procedure: LEFT TOTAL KNEE ARTHROPLASTY;  Surgeon: Loanne Drilling, MD;  Location: [facility];  Service: Orthopedics;  Laterality: Left;   TOTAL KNEE ARTHROPLASTY Right 03/14/2021   Procedure: TOTAL KNEE ARTHROPLASTY;  Surgeon: Ollen Gross, MD;  Location: [facility];  Service: Orthopedics;  Laterality: Right;      No outpatient medications have been marked as taking for the 11/11/21 encounter (Video Visit) with Shakoya Gilmore, DO.     Allergies:   Hydrocodone-acetaminophen, Atorvastatin, Candesartan, Dilaudid [hydromorphone], Enalapril, Losartan, Nexium [esomeprazole], Tramadol, and Wellbutrin [bupropion]   Social History   Tobacco Use   Smoking status: Former    Packs/day: 1.50    Years: 15.00    Pack years: 22.50    Types: Cigarettes    Quit date: 10/02/1997    Years since quitting: 24.1   Smokeless tobacco: Never  Vaping Use   Vaping Use: Never used  Substance Use Topics   Alcohol use: Yes    Alcohol/week: 14.0 standard drinks    Types: 14 Glasses of wine per week   Drug use: No     Family Hx: The patient's family history includes Breast cancer in his  mother; Hypertension in his mother and another family member. There is no history of Colon cancer.  ROS:   Review of Systems  Constitution: Negative for decreased appetite, fever and weight gain.  HENT: Negative for congestion, ear discharge, hoarse voice and sore throat.   Eyes: Negative for discharge, redness, vision loss in right eye and visual halos.  Cardiovascular: Reports for chest pain,.negative for dyspnea on exertion, leg swelling, orthopnea and palpitations.  Respiratory: Negative for cough, hemoptysis, shortness of breath and snoring.   Endocrine: Negative for heat intolerance and polyphagia.   Hematologic/Lymphatic: Negative for bleeding problem. Does not bruise/bleed easily.  Skin: Negative for flushing, nail changes, rash and suspicious lesions.  Musculoskeletal: Negative for arthritis, joint pain, muscle cramps, myalgias, neck pain and stiffness.  Gastrointestinal: Negative for abdominal pain, bowel incontinence, diarrhea and excessive appetite.  Genitourinary: Negative for decreased libido, genital sores and incomplete emptying.  Neurological: Negative for brief paralysis, focal weakness, headaches and loss of balance.  Psychiatric/Behavioral: Negative for altered mental status, depression and suicidal ideas.  Allergic/Immunologic: Negative for HIV exposure and persistent infections.   Prior CV studies:   The following studies were reviewed today:  CCTA 11/10/2021   FINDINGS: Quality: Good, attenuation artifact, HR 60   Coronary calcium score: The patient's coronary artery calcium score is 2159, which places the patient in the 95th percentile.   Coronary arteries: Normal coronary origins.  Right dominance.   Right Coronary Artery: Dominant. Diffusely calcified with mild to moderate (50-69%) proximal stenosis, CADRADS3.   Left Main Coronary Artery: Calcified with minimal 1-24% stenosis. Bifurcates into the LAD and LCx arteries.   Left Anterior Descending Coronary Artery: Anterior vessel which tapers after the first diagonal. There is moderate to severe proximal stenosis with heavy calcification (70-99%, CADRADS4a). The D1 branch has minimal mixed stenosis (1-24%, CADRADS1). The D2 branch has moderate to severe (70-99%, CADRADS4a) stenosis.   Left Circumflex Artery: Heavily calcified AV groove vessel with at least moderate (50-69%, CADRADS3) stenosis.   Aorta: Normal size, 33 mm at the mid ascending aorta (level of the PA bifurcation) measured double oblique. Aortic atherosclerosis. No dissection.   Aortic Valve: Trileaflet. Moderate aortic valve leaflet and  annular calcification.   Other findings:   Normal pulmonary vein drainage into the left atrium.   Normal left atrial appendage without a thrombus.   Normal size of the pulmonary artery.   Mitral annular calcification   IMPRESSION: 1. Moderate to severe, heavily calcified 3 vessel CAD, CADRADS = 4a. CT FFR will be performed and reported separately.   2. Coronary calcium score of 2159. This was 95th percentile for age and sex matched control.   3. Normal coronary origin with right dominance.   4. Moderate aortic valve leaflet and annular calcification.   5. Aortic atherosclerosis.   6. Would consider definitive cardiac catheterization.     Electronically Signed   By: Chrystie Nose M.D.   On: 11/10/2021 13:03    Labs/Other Tests and Data Reviewed:    EKG: None today  Recent Labs: 07/19/2021: ALT 32 11/03/2021: BUN 14; Creatinine, Ser 0.82; Hemoglobin 15.0; Platelets 108; Potassium 4.2; Sodium 135   Recent Lipid Panel Lab Results  Component Value Date/Time   CHOL 206 (H) 09/23/2019 09:48 AM   TRIG 149 09/23/2019 09:48 AM   HDL 61 09/23/2019 09:48 AM   CHOLHDL 4 03/29/2018 07:53 AM   LDLCALC 119 (H) 09/23/2019 09:48 AM   LDLDIRECT 146.2 10/07/2010 08:14 AM    Wt Readings from Last  3 Encounters:  11/11/21 276 lb (125.2 kg)  11/07/21 280 lb (127 kg)  07/20/21 278 lb 3.2 oz (126.2 kg)     Objective:    Vital Signs:  BP (!) 141/76    Pulse 75    Ht 5\' 11"  (1.803 m)    Wt 276 lb (125.2 kg)    BMI 38.49 kg/m     ASSESSMENT & PLAN:   Coronary artery disease with flow-limiting lesion seen on FFR CT-I was able to discuss his testing result during his visit.  I recommended a heart catheterization he is agreeable to proceed with this.  He had questions surrounding the testing I was able to educate the patient what a heart catheterization he is and what to expect during the procedure.  Expresses understanding. The patient understands that risks include but are not  limited to stroke (1 in 1000), death (1 in 1000), kidney failure [usually temporary] (1 in 500), bleeding (1 in 200), allergic reaction [possibly serious] (1 in 200), and agrees to proceed. In the meantime he is on Crestor 20 mg daily we will continue this for now.  Start aspirin 81 mg daily.  He will get blood work at his follow-up visit at which time we will reassess his LDL to make sure that his targets are less than 70 if not I plan to increase his Crestor and likely other lipid-lowering agents.    COVID-19 Education: The signs and symptoms of COVID-19 were discussed with the patient and how to seek care for testing (follow up with PCP or arrange E-visit).  The importance of social distancing was discussed today.  Time:   Today, I have spent 15 minutes with the patient with telehealth technology discussing the above problems.     Medication Adjustments/Labs and Tests Ordered: Current medicines are reviewed at length with the patient today.  Concerns regarding medicines are outlined above.   Tests Ordered: No orders of the defined types were placed in this encounter.   Medication Changes: No orders of the defined types were placed in this encounter.   Follow Up:  In Person in 2 week(s)  Signed, Thomasene Ripple, DO  11/11/2021 10:09 AM    [facility]

## 2021-11-11 NOTE — Patient Instructions (Addendum)
Medication Instructions:  Your physician recommends that you continue on your current medications as directed. Please refer to the Current Medication list given to you today.  *If you need a refill on your cardiac medications before your next appointment, please call your pharmacy*   Lab Work: Your physician recommends that you return for lab work in:  TODAY: BMET, [place], CBC If you have labs (blood work) drawn today and your tests are completely normal, you will receive your results only by: MyChart Message (if you have MyChart) OR A paper copy in the mail If you have any lab test that is abnormal or we need to change your treatment, we will call you to review the results.   Testing/Procedures:  [facility] [address] [place] [address] Dept: [phone] Loc: [phone]  Zachary Valdez  11/11/2021  You are scheduled for a Cardiac Catheterization on Tuesday, February 14 with Dr. Glenetta Hew.  1. Please arrive at the [facility] (Main Entrance A) at [facility]: [address] [place], [place] [address] at 9:00 AM (This time is two hours before your procedure to ensure your preparation). Free valet parking service is available.   Special note: Every effort is made to have your procedure done on time. Please understand that emergencies sometimes delay scheduled procedures.  2. Diet: Do not eat solid foods after midnight.  The patient may have clear liquids until 5am upon the day of the procedure.  3. Labs: You will need to have blood drawn on TODAY.  4. Medication instructions in preparation for your procedure:   Contrast Allergy: No  On the morning of your procedure, take your Aspirin and any morning medicines NOT listed above.  You may use sips of water.  5. Plan for one night stay--bring personal belongings. 6. Bring a current list of your medications and current insurance  cards. 7. You MUST have a responsible person to drive you home. 8. Someone MUST be with you the first 24 hours after you arrive home or your discharge will be delayed. 9. Please wear clothes that are easy to get on and off and wear slip-on shoes.  Thank you for allowing [place] to care for you!   -- [facility]    Follow-Up: At [facility], you and your health needs are our priority.  As part of our continuing mission to provide you with exceptional heart care, we have created designated Provider Care Teams.  These Care Teams include your primary Cardiologist (physician) and Advanced Practice Providers (APPs -  Physician Assistants and Nurse Practitioners) who all work together to provide you with the care you need, when you need it.  We recommend signing up for the patient portal called "MyChart".  Sign up information is provided on this After Visit Summary.  MyChart is used to connect with patients for Virtual Visits (Telemedicine).  Patients are able to view lab/test results, encounter notes, upcoming appointments, etc.  Non-urgent messages can be sent to your provider as well.   To learn more about what you can do with MyChart, go to [email or web address].    Your next appointment:   2 week(s)  The format for your next appointment:   In Person  Provider:   Berniece Salines, DO     Other Instructions

## 2021-11-11 NOTE — H&P (View-Only) (Signed)
Virtual Visit via Video Note   This visit type was conducted due to national recommendations for restrictions regarding the COVID-19 Pandemic (e.g. social distancing) in an effort to limit this patient's exposure and mitigate transmission in our community.  Due to his co-morbid illnesses, this patient is at least at moderate risk for complications without adequate follow up.  This format is felt to be most appropriate for this patient at this time.  All issues noted in this document were discussed and addressed.  A limited physical exam was performed with this format.  Please refer to the patient's chart for his consent to telehealth for [facility].      Date:  11/11/2021   ID:  Zachary Valdez, DOB 1950/11/29, MRN [id number]  Patient Location: Home Provider Location: Office/Clinic  PCP:  Tresa Garter, MD  Cardiologist:  Thomasene Ripple, DO  Electrophysiologist:  None   Evaluation Performed:  Follow-Up Visit  Chief Complaint:  " I am ok"  History of Present Illness:    Zachary Valdez is a 71 y.o. male with type 2 diabetes mellitus, hypertension, hyperlipidemia, recent coronary artery disease seen on his coronary CT scan, morbid obesity, is here today to discuss his testing result.  I saw the patient on November 07, 2020 at that time he was experiencing to me chest discomfort.  I recommended he undergo a coronary CT scan and he was agreeable.  Done yesterday he is here today to discuss the result given the fact that he there were concern for severe coronary artery disease.  The patient does not have symptoms concerning for COVID-19 infection (fever, chills, cough, or new shortness of breath).    Past Medical History:  Diagnosis Date   Allergic rhinitis    Anxiety    Diabetes mellitus without complication (HCC)    GERD (gastroesophageal reflux disease)    Hyperlipidemia    Hyperplastic colon polyp 2006   Hypertension    OA (osteoarthritis)    knees,    Obesity    Sleep  apnea    cpap-    Ulcerative esophagitis 2006   Dr. Russella Dar   Vitamin B 12 deficiency    Past Surgical History:  Procedure Laterality Date   KNEE ARTHROSCOPY     Right   TOTAL KNEE ARTHROPLASTY Left 09/07/2014   Procedure: LEFT TOTAL KNEE ARTHROPLASTY;  Surgeon: Loanne Drilling, MD;  Location: [facility];  Service: Orthopedics;  Laterality: Left;   TOTAL KNEE ARTHROPLASTY Right 03/14/2021   Procedure: TOTAL KNEE ARTHROPLASTY;  Surgeon: Ollen Gross, MD;  Location: [facility];  Service: Orthopedics;  Laterality: Right;      No outpatient medications have been marked as taking for the 11/11/21 encounter (Video Visit) with Shakoya Gilmore, DO.     Allergies:   Hydrocodone-acetaminophen, Atorvastatin, Candesartan, Dilaudid [hydromorphone], Enalapril, Losartan, Nexium [esomeprazole], Tramadol, and Wellbutrin [bupropion]   Social History   Tobacco Use   Smoking status: Former    Packs/day: 1.50    Years: 15.00    Pack years: 22.50    Types: Cigarettes    Quit date: 10/02/1997    Years since quitting: 24.1   Smokeless tobacco: Never  Vaping Use   Vaping Use: Never used  Substance Use Topics   Alcohol use: Yes    Alcohol/week: 14.0 standard drinks    Types: 14 Glasses of wine per week   Drug use: No     Family Hx: The patient's family history includes Breast cancer in his  mother; Hypertension in his mother and another family member. There is no history of Colon cancer.  ROS:   Review of Systems  Constitution: Negative for decreased appetite, fever and weight gain.  HENT: Negative for congestion, ear discharge, hoarse voice and sore throat.   Eyes: Negative for discharge, redness, vision loss in right eye and visual halos.  Cardiovascular: Reports for chest pain,.negative for dyspnea on exertion, leg swelling, orthopnea and palpitations.  Respiratory: Negative for cough, hemoptysis, shortness of breath and snoring.   Endocrine: Negative for heat intolerance and polyphagia.   Hematologic/Lymphatic: Negative for bleeding problem. Does not bruise/bleed easily.  Skin: Negative for flushing, nail changes, rash and suspicious lesions.  Musculoskeletal: Negative for arthritis, joint pain, muscle cramps, myalgias, neck pain and stiffness.  Gastrointestinal: Negative for abdominal pain, bowel incontinence, diarrhea and excessive appetite.  Genitourinary: Negative for decreased libido, genital sores and incomplete emptying.  Neurological: Negative for brief paralysis, focal weakness, headaches and loss of balance.  Psychiatric/Behavioral: Negative for altered mental status, depression and suicidal ideas.  Allergic/Immunologic: Negative for HIV exposure and persistent infections.   Prior CV studies:   The following studies were reviewed today:  CCTA 11/10/2021   FINDINGS: Quality: Good, attenuation artifact, HR 60   Coronary calcium score: The patient's coronary artery calcium score is 2159, which places the patient in the 95th percentile.   Coronary arteries: Normal coronary origins.  Right dominance.   Right Coronary Artery: Dominant. Diffusely calcified with mild to moderate (50-69%) proximal stenosis, CADRADS3.   Left Main Coronary Artery: Calcified with minimal 1-24% stenosis. Bifurcates into the LAD and LCx arteries.   Left Anterior Descending Coronary Artery: Anterior vessel which tapers after the first diagonal. There is moderate to severe proximal stenosis with heavy calcification (70-99%, CADRADS4a). The D1 branch has minimal mixed stenosis (1-24%, CADRADS1). The D2 branch has moderate to severe (70-99%, CADRADS4a) stenosis.   Left Circumflex Artery: Heavily calcified AV groove vessel with at least moderate (50-69%, CADRADS3) stenosis.   Aorta: Normal size, 33 mm at the mid ascending aorta (level of the PA bifurcation) measured double oblique. Aortic atherosclerosis. No dissection.   Aortic Valve: Trileaflet. Moderate aortic valve leaflet and  annular calcification.   Other findings:   Normal pulmonary vein drainage into the left atrium.   Normal left atrial appendage without a thrombus.   Normal size of the pulmonary artery.   Mitral annular calcification   IMPRESSION: 1. Moderate to severe, heavily calcified 3 vessel CAD, CADRADS = 4a. CT FFR will be performed and reported separately.   2. Coronary calcium score of 2159. This was 95th percentile for age and sex matched control.   3. Normal coronary origin with right dominance.   4. Moderate aortic valve leaflet and annular calcification.   5. Aortic atherosclerosis.   6. Would consider definitive cardiac catheterization.     Electronically Signed   By: Chrystie Nose M.D.   On: 11/10/2021 13:03    Labs/Other Tests and Data Reviewed:    EKG: None today  Recent Labs: 07/19/2021: ALT 32 11/03/2021: BUN 14; Creatinine, Ser 0.82; Hemoglobin 15.0; Platelets 108; Potassium 4.2; Sodium 135   Recent Lipid Panel Lab Results  Component Value Date/Time   CHOL 206 (H) 09/23/2019 09:48 AM   TRIG 149 09/23/2019 09:48 AM   HDL 61 09/23/2019 09:48 AM   CHOLHDL 4 03/29/2018 07:53 AM   LDLCALC 119 (H) 09/23/2019 09:48 AM   LDLDIRECT 146.2 10/07/2010 08:14 AM    Wt Readings from Last  3 Encounters:  11/11/21 276 lb (125.2 kg)  11/07/21 280 lb (127 kg)  07/20/21 278 lb 3.2 oz (126.2 kg)     Objective:    Vital Signs:  BP (!) 141/76    Pulse 75    Ht 5\' 11"  (1.803 m)    Wt 276 lb (125.2 kg)    BMI 38.49 kg/m     ASSESSMENT & PLAN:   Coronary artery disease with flow-limiting lesion seen on FFR CT-I was able to discuss his testing result during his visit.  I recommended a heart catheterization he is agreeable to proceed with this.  He had questions surrounding the testing I was able to educate the patient what a heart catheterization he is and what to expect during the procedure.  Expresses understanding. The patient understands that risks include but are not  limited to stroke (1 in 1000), death (1 in 1000), kidney failure [usually temporary] (1 in 500), bleeding (1 in 200), allergic reaction [possibly serious] (1 in 200), and agrees to proceed. In the meantime he is on Crestor 20 mg daily we will continue this for now.  Start aspirin 81 mg daily.  He will get blood work at his follow-up visit at which time we will reassess his LDL to make sure that his targets are less than 70 if not I plan to increase his Crestor and likely other lipid-lowering agents.    COVID-19 Education: The signs and symptoms of COVID-19 were discussed with the patient and how to seek care for testing (follow up with PCP or arrange E-visit).  The importance of social distancing was discussed today.  Time:   Today, I have spent 15 minutes with the patient with telehealth technology discussing the above problems.     Medication Adjustments/Labs and Tests Ordered: Current medicines are reviewed at length with the patient today.  Concerns regarding medicines are outlined above.   Tests Ordered: No orders of the defined types were placed in this encounter.   Medication Changes: No orders of the defined types were placed in this encounter.   Follow Up:  In Person in 2 week(s)  Signed, Thomasene Ripple, DO  11/11/2021 10:09 AM    [facility]

## 2021-11-12 LAB — CBC WITH DIFFERENTIAL/PLATELET
Basophils Absolute: 0 10*3/uL (ref 0.0–0.2)
Basos: 1 %
EOS (ABSOLUTE): 0.2 10*3/uL (ref 0.0–0.4)
Eos: 3 %
Hematocrit: 45.5 % (ref 37.5–51.0)
Hemoglobin: 15.5 g/dL (ref 13.0–17.7)
Immature Grans (Abs): 0.1 10*3/uL (ref 0.0–0.1)
Immature Granulocytes: 1 %
Lymphocytes Absolute: 1.5 10*3/uL (ref 0.7–3.1)
Lymphs: 22 %
MCH: 35 pg — ABNORMAL HIGH (ref 26.6–33.0)
MCHC: 34.1 g/dL (ref 31.5–35.7)
MCV: 103 fL — ABNORMAL HIGH (ref 79–97)
Monocytes Absolute: 0.8 10*3/uL (ref 0.1–0.9)
Monocytes: 12 %
Neutrophils Absolute: 4.1 10*3/uL (ref 1.4–7.0)
Neutrophils: 61 %
Platelets: 117 10*3/uL — ABNORMAL LOW (ref 150–450)
RBC: 4.43 x10E6/uL (ref 4.14–5.80)
RDW: 11.8 % (ref 11.6–15.4)
WBC: 6.7 10*3/uL (ref 3.4–10.8)

## 2021-11-12 LAB — BASIC METABOLIC PANEL WITH GFR
BUN/Creatinine Ratio: 15 (ref 10–24)
BUN: 14 mg/dL (ref 8–27)
CO2: 19 mmol/L — ABNORMAL LOW (ref 20–29)
Calcium: 9.7 mg/dL (ref 8.6–10.2)
Chloride: 103 mmol/L (ref 96–106)
Creatinine, Ser: 0.92 mg/dL (ref 0.76–1.27)
Glucose: 108 mg/dL — ABNORMAL HIGH (ref 70–99)
Potassium: 4.1 mmol/L (ref 3.5–5.2)
Sodium: 140 mmol/L (ref 134–144)
eGFR: 91 mL/min/1.73 (ref >59)

## 2021-11-12 LAB — MAGNESIUM: Magnesium: 2.1 mg/dL (ref 1.6–2.3)

## 2021-11-14 ENCOUNTER — Telehealth: Payer: Self-pay | Admitting: *Deleted

## 2021-11-14 NOTE — Telephone Encounter (Signed)
Cardiac catheterization scheduled at [facility] for: Tuesday November 15, 2021 [facility] Main Entrance A [facility]) at: 9 AM   Diet-no solid food after midnight prior to cath, clear liquids until 5 AM day of procedure.  Medication instructions for procedure: -Hold:  Metformin-day of procedure and 48 hours post procedure  -Except hold medications usual morning medications can be taken pre-cath with sips of water including aspirin 81 mg.    Must have responsible adult to drive home post procedure and be with patient first 24 hours after arriving home.  [facility] does allow one visitor to wait in the waiting room during the time you are there.   Patient reports does not currently have any new symptoms concerning for COVID-19 and no household members with COVID-19 like illness.     Reviewed procedure instructions with patient.

## 2021-11-15 ENCOUNTER — Encounter (HOSPITAL_COMMUNITY)
Admission: RE | Disposition: A | Discharge status: Home / Self Care | Payer: Self-pay | Source: Home / Self Care | Attending: Cardiology

## 2021-11-15 ENCOUNTER — Ambulatory Visit (HOSPITAL_COMMUNITY)
Admission: RE | Admit: 2021-11-15 | Discharge: 2021-11-15 | Disposition: A | Discharge status: Home / Self Care | Payer: Medicare HMO | Attending: Cardiology | Admitting: Cardiology

## 2021-11-15 ENCOUNTER — Other Ambulatory Visit: Payer: Self-pay

## 2021-11-15 DIAGNOSIS — Z6838 Body mass index (BMI) 38.0-38.9, adult: Secondary | ICD-10-CM | POA: Diagnosis not present

## 2021-11-15 DIAGNOSIS — I110 Hypertensive heart disease with heart failure: Secondary | ICD-10-CM | POA: Diagnosis not present

## 2021-11-15 DIAGNOSIS — Z87891 Personal history of nicotine dependence: Secondary | ICD-10-CM | POA: Insufficient documentation

## 2021-11-15 DIAGNOSIS — I25118 Atherosclerotic heart disease of native coronary artery with other forms of angina pectoris: Secondary | ICD-10-CM | POA: Diagnosis not present

## 2021-11-15 DIAGNOSIS — E785 Hyperlipidemia, unspecified: Secondary | ICD-10-CM | POA: Insufficient documentation

## 2021-11-15 DIAGNOSIS — Z79899 Other long term (current) drug therapy: Secondary | ICD-10-CM | POA: Diagnosis not present

## 2021-11-15 DIAGNOSIS — I25119 Atherosclerotic heart disease of native coronary artery with unspecified angina pectoris: Secondary | ICD-10-CM | POA: Diagnosis present

## 2021-11-15 DIAGNOSIS — R931 Abnormal findings on diagnostic imaging of heart and coronary circulation: Secondary | ICD-10-CM | POA: Diagnosis present

## 2021-11-15 DIAGNOSIS — I509 Heart failure, unspecified: Secondary | ICD-10-CM | POA: Diagnosis not present

## 2021-11-15 DIAGNOSIS — I2510 Atherosclerotic heart disease of native coronary artery without angina pectoris: Secondary | ICD-10-CM

## 2021-11-15 HISTORY — PX: LEFT HEART CATH AND CORONARY ANGIOGRAPHY: CATH118249

## 2021-11-15 LAB — GLUCOSE, CAPILLARY: Glucose-Capillary: 136 mg/dL — ABNORMAL HIGH (ref 70–99)

## 2021-11-15 SURGERY — LEFT HEART CATH AND CORONARY ANGIOGRAPHY
Anesthesia: LOCAL

## 2021-11-15 MED ORDER — FENTANYL CITRATE (PF) 100 MCG/2ML IJ SOLN
INTRAMUSCULAR | Status: AC
  Filled 2021-11-15: qty 2

## 2021-11-15 MED ORDER — SODIUM CHLORIDE 0.9% FLUSH: 3.0000 mL | Freq: Two times a day (BID) | INTRAVENOUS | Status: DC

## 2021-11-15 MED ORDER — ONDANSETRON HCL 4 MG/2ML IJ SOLN: 4.0000 mg | Freq: Four times a day (QID) | INTRAMUSCULAR | Status: DC | PRN

## 2021-11-15 MED ORDER — SODIUM CHLORIDE 0.9 % IV SOLN: 250.0000 mL | INTRAVENOUS | Status: DC | PRN

## 2021-11-15 MED ORDER — SODIUM CHLORIDE 0.9 % WEIGHT BASED INFUSION
3.0000 mL/kg/h | INTRAVENOUS | Status: AC
  Administered 2021-11-15: 3 mL/kg/h via INTRAVENOUS

## 2021-11-15 MED ORDER — IOHEXOL 350 MG/ML SOLN
INTRAVENOUS | Status: DC | PRN
  Administered 2021-11-15: 55 mL via INTRA_ARTERIAL

## 2021-11-15 MED ORDER — HEPARIN (PORCINE) IN NACL 1000-0.9 UT/500ML-% IV SOLN
INTRAVENOUS | Status: AC
  Filled 2021-11-15: qty 1000

## 2021-11-15 MED ORDER — VERAPAMIL HCL 2.5 MG/ML IV SOLN
INTRAVENOUS | Status: AC
  Filled 2021-11-15: qty 2

## 2021-11-15 MED ORDER — HEPARIN (PORCINE) IN NACL 1000-0.9 UT/500ML-% IV SOLN
INTRAVENOUS | Status: DC | PRN
  Administered 2021-11-15 (×2): 500 mL

## 2021-11-15 MED ORDER — MIDAZOLAM HCL 2 MG/2ML IJ SOLN
INTRAMUSCULAR | Status: DC | PRN
  Administered 2021-11-15: 1 mg via INTRAVENOUS

## 2021-11-15 MED ORDER — ASPIRIN 81 MG PO CHEW: 81.0000 mg | CHEWABLE_TABLET | ORAL | Status: DC

## 2021-11-15 MED ORDER — LIDOCAINE HCL (PF) 1 % IJ SOLN
INTRAMUSCULAR | Status: AC
  Filled 2021-11-15: qty 30

## 2021-11-15 MED ORDER — HEPARIN SODIUM (PORCINE) 1000 UNIT/ML IJ SOLN
INTRAMUSCULAR | Status: AC
  Filled 2021-11-15: qty 10

## 2021-11-15 MED ORDER — FENTANYL CITRATE (PF) 100 MCG/2ML IJ SOLN
INTRAMUSCULAR | Status: DC | PRN
  Administered 2021-11-15: 25 ug via INTRAVENOUS

## 2021-11-15 MED ORDER — SODIUM CHLORIDE 0.9% FLUSH: 3.0000 mL | INTRAVENOUS | Status: DC | PRN

## 2021-11-15 MED ORDER — LABETALOL HCL 5 MG/ML IV SOLN: 10.0000 mg | INTRAVENOUS | Status: DC | PRN

## 2021-11-15 MED ORDER — SODIUM CHLORIDE 0.9 % WEIGHT BASED INFUSION: 1.0000 mL/kg/h | INTRAVENOUS | Status: DC

## 2021-11-15 MED ORDER — SODIUM CHLORIDE 0.9 % IV SOLN: INTRAVENOUS | Status: DC

## 2021-11-15 MED ORDER — NITROGLYCERIN 1 MG/10 ML FOR IR/CATH LAB
INTRA_ARTERIAL | Status: AC
  Filled 2021-11-15: qty 10

## 2021-11-15 MED ORDER — ACETAMINOPHEN 325 MG PO TABS: 650.0000 mg | ORAL_TABLET | ORAL | Status: DC | PRN

## 2021-11-15 MED ORDER — HYDRALAZINE HCL 20 MG/ML IJ SOLN: 10.0000 mg | INTRAMUSCULAR | Status: DC | PRN

## 2021-11-15 MED ORDER — MIDAZOLAM HCL 2 MG/2ML IJ SOLN
INTRAMUSCULAR | Status: AC
  Filled 2021-11-15: qty 2

## 2021-11-15 MED ORDER — LIDOCAINE HCL (PF) 1 % IJ SOLN
INTRAMUSCULAR | Status: DC | PRN
  Administered 2021-11-15: 2 mL via INTRADERMAL

## 2021-11-15 MED ORDER — NITROGLYCERIN 1 MG/10 ML FOR IR/CATH LAB
INTRA_ARTERIAL | Status: DC | PRN
  Administered 2021-11-15: 200 ug via INTRACORONARY

## 2021-11-15 MED ORDER — HEPARIN SODIUM (PORCINE) 1000 UNIT/ML IJ SOLN
INTRAMUSCULAR | Status: DC | PRN
  Administered 2021-11-15: 6000 [IU] via INTRAVENOUS

## 2021-11-15 MED ORDER — VERAPAMIL HCL 2.5 MG/ML IV SOLN
INTRAVENOUS | Status: DC | PRN
  Administered 2021-11-15: 10 mL via INTRA_ARTERIAL

## 2021-11-15 SURGICAL SUPPLY — 11 items
CATH INFINITI 5FR ANG PIGTAIL (CATHETERS) ×1 IMPLANT
CATH OPTITORQUE TIG 4.0 5F (CATHETERS) ×1 IMPLANT
DEVICE RAD COMP TR BAND LRG (VASCULAR PRODUCTS) ×1 IMPLANT
GLIDESHEATH SLEND SS 6F .021 (SHEATH) ×1 IMPLANT
GUIDEWIRE INQWIRE 1.5J.035X260 (WIRE) IMPLANT
INQWIRE 1.5J .035X260CM (WIRE) ×2
KIT HEART LEFT (KITS) ×2 IMPLANT
PACK CARDIAC CATHETERIZATION (CUSTOM PROCEDURE TRAY) ×2 IMPLANT
SYR MEDRAD MARK 7 150ML (SYRINGE) ×2 IMPLANT
TRANSDUCER W/STOPCOCK (MISCELLANEOUS) ×2 IMPLANT
TUBING CIL FLEX 10 FLL-RA (TUBING) ×2 IMPLANT

## 2021-11-15 NOTE — Brief Op Note (Addendum)
11/15/2021  6:33 PM  PATIENT:  Zachary Valdez  71 y.o. male referred for Coronary CTA by Dr. Harriet Masson for evaluation of chest pain.  This showed possible significant disease, but CT FFR was equivocal he is therefore referred for invasive evaluation.  PRE-OPERATIVE DIAGNOSIS:  CAD with abnormal Coronary CTA  POST-OPERATIVE DIAGNOSIS:   Diffuse mild to moderate disease with relatively small caliber vessels. Likely CTO of OM 3, (~1.5 mm vessel -> not a PCI target) Moderate disease in RCA and LCx as well as moderate to severe disease in small D2.  No obvious PCI options. . Moderately elevated LVEDP after hydration  PROCEDURE:  Procedure(s): LEFT HEART CATH AND CORONARY ANGIOGRAPHY (N/A)  SURGEON:  Surgeon(s) and Role:    * Leonie Man, MD - Primary   PROCEDURE Time Out: Verified patient identification, verified procedure, site/side was marked, verified correct patient position, special equipment/implants available, medications/allergies/relevent history reviewed, required imaging and test results available. Performed.  Access:  RIGHT Radial Artery: 6 Fr sheath -- Seldinger technique using Micropuncture Kit --  Direct ultrasound guidance used.  Permanent image obtained and placed on chart. -- 10 mL radial cocktail IA; 6000 Units IV Heparin  Left Heart Catheterization: 5Fr Catheters advanced or exchanged over a J-wire under direct fluoroscopic guidance into the ascending aorta; TIG 4.0 catheter advanced first.  * LV Hemodynamics (no LV gram LV Gram): Angled pigtail catheter * Left & Right Coronary Artery Cineangiography: TIG 4.0 catheter   Review of initial angiography revealed: Likely CTO of OM 3, moderate disease in RCA and LCx as well as moderate to severe disease in small D2.  No obvious PCI options.  (CTO of OM 3 is a ~1.5 mm vessel.  Preparations are made for medical management  Upon completion of Angiogaphy, the catheter was removed completely out of the body over a wire, without  complication.   Radial sheath removed in the Cardiac Catheterization lab with TR Band placed for hemostasis.  TR Band: 1350 Hours; 12 mL air  MEDICATIONS SQ Lidocaine 3 mL Radial Cocktail: 3 mg Verapmil in 10 mL NS Heparin: 6000 units Contrast 55 mL  ANESTHESIA:   local and IV sedation -> 2 mg Versed 50 mg fentanyl, 3 mL SQ lidocaine  EBL:   <20 mL   COUNTS:  YES  DICTATION: .Note written in EPIC  PLAN OF CARE: Discharge to home after PACU  PATIENT DISPOSITION:  PACU - hemodynamically stable. -> optimize medical management.  Discharge home.  Follow-up with Dr. Harriet Masson     Delay start of Pharmacological VTE agent (>24hrs) due to surgical blood loss or risk of bleeding: not applicable   Glenetta Hew, MD

## 2021-11-15 NOTE — Discharge Instructions (Signed)
Radial Site Care  This sheet gives you information about how to care for yourself after your procedure. Your health care provider may also give you more specific instructions. If you have problems or questions, contact your health care provider. What can I expect after the procedure? After the procedure, it is common to have: Bruising and tenderness at the catheter insertion area. Follow these instructions at home: Medicines Take over-the-counter and prescription medicines only as told by your health care provider. Insertion site care Follow instructions from your health care provider about how to take care of your insertion site. Make sure you: Wash your hands with soap and water before you remove your bandage (dressing). If soap and water are not available, use hand sanitizer. May remove dressing in 24 hours. Check your insertion site every day for signs of infection. Check for: Redness, swelling, or pain. Fluid or blood. Pus or a bad smell. Warmth. Do no take baths, swim, or use a hot tub for 5 days. You may shower 24-48 hours after the procedure. Remove the dressing and gently wash the site with plain soap and water. Pat the area dry with a clean towel. Do not rub the site. That could cause bleeding. Do not apply powder or lotion to the site. Activity  For 24 hours after the procedure, or as directed by your health care provider: Do not flex or bend the affected arm. Do not push or pull heavy objects with the affected arm. Do not drive yourself home from the hospital or clinic. You may drive 24 hours after the procedure. Do not operate machinery or power tools. KEEP ARM ELEVATED THE REMAINDER OF THE DAY. Do not push, pull or lift anything that is heavier than 10 lb for 5 days. Ask your health care provider when it is okay to: Return to work or school. Resume usual physical activities or sports. Resume sexual activity. General instructions If the catheter site starts to  bleed, raise your arm and put firm pressure on the site. If the bleeding does not stop, get help right away. This is a medical emergency. DRINK PLENTY OF FLUIDS FOR THE NEXT 2-3 DAYS. No alcohol consumption for 24 hours after receiving sedation. If you went home on the same day as your procedure, a responsible adult should be with you for the first 24 hours after you arrive home. Keep all follow-up visits as told by your health care provider. This is important. Contact a health care provider if: You have a fever. You have redness, swelling, or yellow drainage around your insertion site. Get help right away if: You have unusual pain at the radial site. The catheter insertion area swells very fast. The insertion area is bleeding, and the bleeding does not stop when you hold steady pressure on the area. Your arm or hand becomes pale, cool, tingly, or numb. These symptoms may represent a serious problem that is an emergency. Do not wait to see if the symptoms will go away. Get medical help right away. Call your local emergency services (911 in the [place] Do not drive yourself to the hospital. Summary After the procedure, it is common to have bruising and tenderness at the site. Follow instructions from your health care provider about how to take care of your radial site wound. Check the wound every day for signs of infection.  This information is not intended to replace advice given to you by your health care provider. Make sure you discuss any questions you have with   your health care provider. Document Revised: 10/24/2017 Document Reviewed: 10/24/2017 Elsevier Patient Education  2020 [employer]  

## 2021-11-15 NOTE — Interval H&P Note (Signed)
History and Physical Interval Note:  11/15/2021 1:03 PM  Zachary Valdez  has presented today for surgery, with the diagnosis of CAD with abnormal FFR.    The various methods of treatment have been discussed with the patient and family. After consideration of risks, benefits and other options for treatment, the patient has consented to  Procedure(s): LEFT HEART CATH AND CORONARY ANGIOGRAPHY (N/A)  PERCUTANEOUS CORONARY INTERVENTION   as a surgical intervention.  The patient's history has been reviewed, patient examined, no change in status, stable for surgery.  I have reviewed the patient's chart and labs.  Questions were answered to the patient's satisfaction.     Cath Lab Visit (complete for each Cath Lab visit)  Clinical Evaluation Leading to the Procedure:   ACS: No.  Non-ACS:    Anginal Classification: CCS III  Anti-ischemic medical therapy: Minimal Therapy (1 class of medications)  Non-Invasive Test Results: Equivocal test results -> findings appear to be abnormal, but CT FFR was not read as significant.  Prior CABG: No previous CABG    Glenetta Hew

## 2021-11-16 ENCOUNTER — Other Ambulatory Visit: Payer: Self-pay | Admitting: Cardiology

## 2021-11-16 ENCOUNTER — Encounter (HOSPITAL_COMMUNITY): Payer: Self-pay | Admitting: Cardiology

## 2021-11-16 DIAGNOSIS — M79604 Pain in right leg: Secondary | ICD-10-CM

## 2021-11-16 DIAGNOSIS — M79605 Pain in left leg: Secondary | ICD-10-CM

## 2021-11-16 DIAGNOSIS — I739 Peripheral vascular disease, unspecified: Secondary | ICD-10-CM

## 2021-11-21 ENCOUNTER — Ambulatory Visit: Payer: Medicare HMO | Admitting: Internal Medicine

## 2021-11-21 ENCOUNTER — Telehealth: Payer: Self-pay | Admitting: Internal Medicine

## 2021-11-21 NOTE — Telephone Encounter (Signed)
LVM for pt to rtn my call to schedle AWV with [facility]. Please schedule if pt calls the office.

## 2021-11-24 ENCOUNTER — Telehealth: Payer: Self-pay

## 2021-11-24 NOTE — Telephone Encounter (Signed)
Called pt to let him know he can come at 2 tomorrow instead of 11am since he has an ultrasound appt here at 3pm. No answer at this time, left message for him to return the call.

## 2021-11-25 ENCOUNTER — Ambulatory Visit (HOSPITAL_COMMUNITY)
Admission: RE | Admit: 2021-11-25 | Discharge: 2021-11-25 | Disposition: A | Discharge status: Home / Self Care | Payer: Medicare HMO | Source: Ambulatory Visit | Attending: Cardiology | Admitting: Cardiology

## 2021-11-25 ENCOUNTER — Encounter: Payer: Self-pay | Admitting: Cardiology

## 2021-11-25 ENCOUNTER — Ambulatory Visit (INDEPENDENT_AMBULATORY_CARE_PROVIDER_SITE_OTHER): Payer: Medicare HMO | Admitting: Cardiology

## 2021-11-25 ENCOUNTER — Other Ambulatory Visit: Payer: Self-pay

## 2021-11-25 VITALS — BP 170/76 | HR 88 | Ht 71.0 in | Wt 278.8 lb

## 2021-11-25 DIAGNOSIS — I25119 Atherosclerotic heart disease of native coronary artery with unspecified angina pectoris: Secondary | ICD-10-CM | POA: Diagnosis not present

## 2021-11-25 DIAGNOSIS — I739 Peripheral vascular disease, unspecified: Secondary | ICD-10-CM | POA: Diagnosis not present

## 2021-11-25 DIAGNOSIS — E669 Obesity, unspecified: Secondary | ICD-10-CM | POA: Diagnosis not present

## 2021-11-25 DIAGNOSIS — I10 Essential (primary) hypertension: Secondary | ICD-10-CM | POA: Diagnosis not present

## 2021-11-25 DIAGNOSIS — M79605 Pain in left leg: Secondary | ICD-10-CM

## 2021-11-25 DIAGNOSIS — M79604 Pain in right leg: Secondary | ICD-10-CM | POA: Diagnosis not present

## 2021-11-25 DIAGNOSIS — E785 Hyperlipidemia, unspecified: Secondary | ICD-10-CM

## 2021-11-25 MED ORDER — ISOSORBIDE MONONITRATE ER 30 MG PO TB24: 30.0000 mg | ORAL_TABLET | Freq: Every day | ORAL | 3 refills | Status: DC

## 2021-11-25 NOTE — Patient Instructions (Signed)
Medication Instructions:  Your physician has recommended you make the following change in your medication START Imdur 30mg  once daily.  *If you need a refill on your cardiac medications before your next appointment, please call your pharmacy*   Lab Work: Your physician recommends that you return for lab work in 12 weeks Lipids - Please come fasting.   If you have labs (blood work) drawn today and your tests are completely normal, you will receive your results only by: [place] (if you have MyChart) OR A paper copy in the mail If you have any lab test that is abnormal or we need to change your treatment, we will call you to review the results.   Testing/Procedures: NONE.   Follow-Up: At [facility], you and your health needs are our priority.  As part of our continuing mission to provide you with exceptional heart care, we have created designated Provider Care Teams.  These Care Teams include your primary Cardiologist (physician) and Advanced Practice Providers (APPs -  Physician Assistants and Nurse Practitioners) who all work together to provide you with the care you need, when you need it.  We recommend signing up for the patient portal called "MyChart".  Sign up information is provided on this After Visit Summary.  MyChart is used to connect with patients for Virtual Visits (Telemedicine).  Patients are able to view lab/test results, encounter notes, upcoming appointments, etc.  Non-urgent messages can be sent to your provider as well.   To learn more about what you can do with MyChart, go to [email or web address].    Your next appointment:   6 month(s)  The format for your next appointment:   In Person  Provider:   Berniece Salines, DO     Other Instructions Please record you blood pressure and heart rate once a day for one week and update [place] via my chart with the information.

## 2021-11-25 NOTE — Progress Notes (Signed)
Cardiology Office Note:    Date:  11/25/2021   ID:  Zachary Valdez, DOB [birth year], MRN [id number]  PCP:  Cassandria Anger, MD  Cardiologist:  Berniece Salines, DO  Electrophysiologist:  None   Referring MD: Cassandria Anger, MD   "I am ok"  History of Present Illness:    Zachary Valdez is a 71 y.o. male with a hx of type 2 diabetes mellitus, hypertension, hyperlipidemia, coronary artery disease initially seen on coronary CTA, status post left heart catheterization which show multivessel disease with diffuse moderate disease in the circumflex, OM 3 lesion with 100% stenosis however small caliber vessel, mid LAD with 40% stenosis, mid to distal 45% stenosis, first diagonal ostial 50% lesion, second diagonal proximal 65% lesion, proximal RCA 25% with 70% sidebranch in the right RV branch, mid to distal 25% stenosis overall there was no targets for his CTO and medical management was encouraged, he was noted to have elevated LVEDP after hydration.'  He tells me since his cath he has been doing well.  He is only has had some leg pain but is pending his ultrasound studies today.   Past Medical History:  Diagnosis Date   Allergic rhinitis    Anxiety    Diabetes mellitus without complication ([place])    GERD (gastroesophageal reflux disease)    Hyperlipidemia    Hyperplastic colon polyp 2006   Hypertension    OA (osteoarthritis)    knees,    Obesity    Sleep apnea    cpap-    Ulcerative esophagitis 2006   Dr. Fuller Plan   Vitamin B 12 deficiency     Past Surgical History:  Procedure Laterality Date   KNEE ARTHROSCOPY     Right   LEFT HEART CATH AND CORONARY ANGIOGRAPHY N/A 11/15/2021   Procedure: LEFT HEART CATH AND CORONARY ANGIOGRAPHY;  Surgeon: Leonie Man, MD;  Location: [facility];  Service: Cardiovascular;  Laterality: N/A;   TOTAL KNEE ARTHROPLASTY Left 09/07/2014   Procedure: LEFT TOTAL KNEE ARTHROPLASTY;  Surgeon: Gearlean Alf, MD;  Location: [facility];  Service:  Orthopedics;  Laterality: Left;   TOTAL KNEE ARTHROPLASTY Right 03/14/2021   Procedure: TOTAL KNEE ARTHROPLASTY;  Surgeon: Gaynelle Arabian, MD;  Location: [facility];  Service: Orthopedics;  Laterality: Right;  32min    Current Medications: Current Meds  Medication Sig   aspirin EC 81 MG tablet Take 81 mg by mouth daily. Swallow whole.   isosorbide mononitrate (IMDUR) 30 MG 24 hr tablet Take 1 tablet (30 mg total) by mouth daily.   metFORMIN (GLUCOPHAGE XR) 750 MG 24 hr tablet Take 1 tablet (750 mg total) by mouth daily with breakfast.   olmesartan (BENICAR) 40 MG tablet TAKE ONE TABLET BY MOUTH DAILY   pantoprazole (PROTONIX) 40 MG tablet TAKE ONE TABLET BY MOUTH DAILY AS NEEDED (Patient taking differently: daily.)   rosuvastatin (CRESTOR) 20 MG tablet Take 20 mg by mouth daily.   vitamin B-12 (CYANOCOBALAMIN) 1000 MCG tablet Take 1 tablet (1,000 mcg total) by mouth daily.     Allergies:   Hydrocodone-acetaminophen, Atorvastatin, Candesartan, Dilaudid [hydromorphone], Enalapril, Losartan, Nexium [esomeprazole], Tramadol, and Wellbutrin [bupropion]   Social History   Socioeconomic History   Marital status: Divorced    Spouse name: Not on file   Number of children: Not on file   Years of education: Not on file   Highest education level: Not on file  Occupational History   Occupation: retired  Tobacco Use  Smoking status: Former    Packs/day: 1.50    Years: 15.00    Pack years: 22.50    Types: Cigarettes    Quit date: 10/02/1997    Years since quitting: 24.1   Smokeless tobacco: Never  Vaping Use   Vaping Use: Never used  Substance and Sexual Activity   Alcohol use: Yes    Alcohol/week: 14.0 standard drinks    Types: 14 Glasses of wine per week   Drug use: No   Sexual activity: Not Currently  Other Topics Concern   Not on file  Social History Narrative   Daily caffeine- 2-3 per day   Social Determinants of Health   Financial Resource Strain: Low Risk    Difficulty of  Paying Living Expenses: Not hard at all  Food Insecurity: No Food Insecurity   Worried About Charity fundraiser in the Last Year: Never true   Ran Out of Food in the Last Year: Never true  Transportation Needs: No Transportation Needs   Lack of Transportation (Medical): No   Lack of Transportation (Non-Medical): No  Physical Activity: Inactive   Days of Exercise per Week: 0 days   Minutes of Exercise per Session: 0 min  Stress: No Stress Concern Present   Feeling of Stress : Not at all  Social Connections: Moderately Integrated   Frequency of Communication with Friends and Family: More than three times a week   Frequency of Social Gatherings with Friends and Family: More than three times a week   Attends Religious Services: 1 to 4 times per year   Active Member of [employer] or Organizations: No   Attends Music therapist: 1 to 4 times per year   Marital Status: Never married     Family History: The patient's family history includes Breast cancer in his mother; Hypertension in his mother and another family member. There is no history of Colon cancer.  ROS:   Review of Systems  Constitution: Negative for decreased appetite, fever and weight gain.  HENT: Negative for congestion, ear discharge, hoarse voice and sore throat.   Eyes: Negative for discharge, redness, vision loss in right eye and visual halos.  Cardiovascular: Negative for chest pain, dyspnea on exertion, leg swelling, orthopnea and palpitations.  Respiratory: Negative for cough, hemoptysis, shortness of breath and snoring.   Endocrine: Negative for heat intolerance and polyphagia.  Hematologic/Lymphatic: Negative for bleeding problem. Does not bruise/bleed easily.  Skin: Negative for flushing, nail changes, rash and suspicious lesions.  Musculoskeletal: Negative for arthritis, joint pain, muscle cramps, myalgias, neck pain and stiffness.  Gastrointestinal: Negative for abdominal pain, bowel incontinence,  diarrhea and excessive appetite.  Genitourinary: Negative for decreased libido, genital sores and incomplete emptying.  Neurological: Negative for brief paralysis, focal weakness, headaches and loss of balance.  Psychiatric/Behavioral: Negative for altered mental status, depression and suicidal ideas.  Allergic/Immunologic: Negative for HIV exposure and persistent infections.    EKGs/Labs/Other Studies Reviewed:    The following studies were reviewed today:   EKG: None today  Left heart catheterization November 15, 2021    LCx is diffusely diseased 30 to 60%   3rd Mrg lesion is 100% stenosed. -Small caliber vessel, CTO with left to left collaterals filling the distal vessel.   Mid LAD lesion is 40% stenosed -immediately after 2nd Diag.   Mid LAD to Dist LAD lesion is 45% stenosed -> diffusely diseased with no focal stenosis.  Tapers to small caliber apical vessel.   1st Diag - >  ostial lesion lesion is 50% stenosed.   2nd Diag -> proximal lesion is 65% stenosed.   Prox RCA lesion is 25% stenosed with 70% stenosed side branch in RV Branch.   Mid RCA to Dist RCA lesion is 25% stenosed.   LV end diastolic pressure is moderately elevated.   SUMMARY Diffuse mild to moderate disease with relatively small caliber vessels. Likely CTO of OM 3, (~1.5 mm vessel -> not a PCI target) Moderate disease in RCA and LCx as well as moderate to severe disease in small D2.  No obvious PCI options. . Moderately elevated LVEDP after hydration   PLAN OF CARE: Discharge to home after PACU Optimize medical therapy for treatment of small vessel occlusive disease and diffuse moderate disease elsewhere..    Recent Labs: 07/19/2021: ALT 32 11/11/2021: BUN 14; Creatinine, Ser 0.92; Hemoglobin 15.5; Magnesium 2.1; Platelets 117; Potassium 4.1; Sodium 140  Recent Lipid Panel    Component Value Date/Time   CHOL 206 (H) 09/23/2019 0948   TRIG 149 09/23/2019 0948   HDL 61 09/23/2019 0948   CHOLHDL 4  03/29/2018 0753   VLDL 22.2 03/29/2018 0753   LDLCALC 119 (H) 09/23/2019 0948   LDLDIRECT 146.2 10/07/2010 0814    Physical Exam:    VS:  BP (!) 170/76    Pulse 88    Ht 5\' 11"  (1.803 m)    Wt 278 lb 12.8 oz (126.5 kg)    SpO2 95%    BMI 38.88 kg/m     Wt Readings from Last 3 Encounters:  11/25/21 278 lb 12.8 oz (126.5 kg)  11/15/21 270 lb (122.5 kg)  11/11/21 276 lb (125.2 kg)     GEN: Well nourished, well developed in no acute distress HEENT: Normal NECK: No JVD; No carotid bruits LYMPHATICS: No lymphadenopathy CARDIAC: S1S2 noted,RRR, no murmurs, rubs, gallops RESPIRATORY:  Clear to auscultation without rales, wheezing or rhonchi  ABDOMEN: Soft, non-tender, non-distended, +bowel sounds, no guarding. EXTREMITIES: No edema, No cyanosis, no clubbing MUSCULOSKELETAL:  No deformity  SKIN: Warm and dry NEUROLOGIC:  Alert and oriented x 3, non-focal PSYCHIATRIC:  Normal affect, good insight  ASSESSMENT:    1. Coronary artery disease involving native coronary artery of native heart with angina pectoris ([place])   2. Hyperlipidemia, unspecified hyperlipidemia type   3. Leg pain, bilateral   4. Obesity (BMI 30-39.9)   5. Essential hypertension    PLAN:    Coronary artery disease-recently diagnosed recent cath with multivessel disease no targets for any intervention.  Discussed with patient medical management will be pursued.  He is on aspirin 81 mg daily, will continue the rosuvastatin 20 mg daily and Imdur 30 mg daily will be started today. Plan to repeat lipid profile in 12 weeks his goal is less than 55 LDL.  If he does not meet his goal LDL I plan to optimize his lipid-lowering agents.  Hypertension-his blood pressure is slightly elevated today but he tells me at home his systolics has not been over 130 mmHg.  I have asked the patient to take his blood pressure daily and send that information to me via MyChart.  I have also asked him to bring his blood pressure monitor at his  next visit.  I will hold off on increasing other antihypertensive medications until I get his blood pressure report.  In the meantime his antianginal with nitrates should help with blood pressure control as well.  Obesity-history of weight loss  Leg pain, he is scheduled for his  bilateral arterial Doppler ultrasound today.  I will share his results with him.  The patient is in agreement with the above plan. The patient left the office in stable condition.  The patient will follow up in   Medication Adjustments/Labs and Tests Ordered: Current medicines are reviewed at length with the patient today.  Concerns regarding medicines are outlined above.  Orders Placed This Encounter  Procedures   Lipid Profile   Meds ordered this encounter  Medications   isosorbide mononitrate (IMDUR) 30 MG 24 hr tablet    Sig: Take 1 tablet (30 mg total) by mouth daily.    Dispense:  90 tablet    Refill:  3    Patient Instructions  Medication Instructions:  Your physician has recommended you make the following change in your medication START Imdur 30mg  once daily.  *If you need a refill on your cardiac medications before your next appointment, please call your pharmacy*   Lab Work: Your physician recommends that you return for lab work in 12 weeks Lipids - Please come fasting.   If you have labs (blood work) drawn today and your tests are completely normal, you will receive your results only by: [place] (if you have MyChart) OR A paper copy in the mail If you have any lab test that is abnormal or we need to change your treatment, we will call you to review the results.   Testing/Procedures: NONE.   Follow-Up: At [facility], you and your health needs are our priority.  As part of our continuing mission to provide you with exceptional heart care, we have created designated Provider Care Teams.  These Care Teams include your primary Cardiologist (physician) and Advanced Practice  Providers (APPs -  Physician Assistants and Nurse Practitioners) who all work together to provide you with the care you need, when you need it.  We recommend signing up for the patient portal called "MyChart".  Sign up information is provided on this After Visit Summary.  MyChart is used to connect with patients for Virtual Visits (Telemedicine).  Patients are able to view lab/test results, encounter notes, upcoming appointments, etc.  Non-urgent messages can be sent to your provider as well.   To learn more about what you can do with MyChart, go to [email or web address].    Your next appointment:   6 month(s)  The format for your next appointment:   In Person  Provider:   Berniece Salines, DO     Other Instructions Please record you blood pressure and heart rate once a day for one week and update [place] via my chart with the information.     Adopting a Healthy Lifestyle.  Know what a healthy weight is for you (roughly BMI <25) and aim to maintain this   Aim for 7+ servings of fruits and vegetables daily   65-80+ fluid ounces of water or unsweet tea for healthy kidneys   Limit to max 1 drink of alcohol per day; avoid smoking/tobacco   Limit animal fats in diet for cholesterol and heart health - choose grass fed whenever available   Avoid highly processed foods, and foods high in saturated/trans fats   Aim for low stress - take time to unwind and care for your mental health   Aim for 150 min of moderate intensity exercise weekly for heart health, and weights twice weekly for bone health   Aim for 7-9 hours of sleep daily   When it comes to diets, agreement about the perfect plan  isnt easy to find, even among the experts. Experts at the [employer] developed an idea known as the Healthy Eating Plate. Just imagine a plate divided into logical, healthy portions.   The emphasis is on diet quality:   Load up on vegetables and fruits - one-half of your plate: Aim for  color and variety, and remember that potatoes dont count.   Go for whole grains - one-quarter of your plate: Whole wheat, barley, wheat berries, quinoa, oats, brown rice, and foods made with them. If you want pasta, go with whole wheat pasta.   Protein power - one-quarter of your plate: Fish, chicken, beans, and nuts are all healthy, versatile protein sources. Limit red meat.   The diet, however, does go beyond the plate, offering a few other suggestions.   Use healthy plant oils, such as olive, canola, soy, corn, sunflower and peanut. Check the labels, and avoid partially hydrogenated oil, which have unhealthy trans fats.   If youre thirsty, drink water. Coffee and tea are good in moderation, but skip sugary drinks and limit milk and dairy products to one or two daily servings.   The type of carbohydrate in the diet is more important than the amount. Some sources of carbohydrates, such as vegetables, fruits, whole grains, and beans-are healthier than others.   Finally, stay active  Signed, Berniece Salines, DO  11/25/2021 9:38 PM    [facility]

## 2021-11-28 ENCOUNTER — Other Ambulatory Visit: Payer: Self-pay

## 2021-11-28 ENCOUNTER — Telehealth: Payer: Self-pay

## 2021-11-28 DIAGNOSIS — I739 Peripheral vascular disease, unspecified: Secondary | ICD-10-CM

## 2021-11-28 NOTE — Telephone Encounter (Signed)
Spoke with pt. Appointment scheduled with pt to see Dr. Gwenlyn Found. Pt has no acute concerns at this time. Pt verbalizes understanding.

## 2021-11-29 ENCOUNTER — Encounter: Payer: Self-pay | Admitting: Internal Medicine

## 2021-11-29 ENCOUNTER — Other Ambulatory Visit: Payer: Self-pay

## 2021-11-29 ENCOUNTER — Ambulatory Visit (INDEPENDENT_AMBULATORY_CARE_PROVIDER_SITE_OTHER): Payer: Medicare HMO | Admitting: Internal Medicine

## 2021-11-29 DIAGNOSIS — K219 Gastro-esophageal reflux disease without esophagitis: Secondary | ICD-10-CM

## 2021-11-29 DIAGNOSIS — I25119 Atherosclerotic heart disease of native coronary artery with unspecified angina pectoris: Secondary | ICD-10-CM | POA: Diagnosis not present

## 2021-11-29 DIAGNOSIS — E66812 Obesity, class 2: Secondary | ICD-10-CM

## 2021-11-29 DIAGNOSIS — I739 Peripheral vascular disease, unspecified: Secondary | ICD-10-CM

## 2021-11-29 DIAGNOSIS — Z6835 Body mass index (BMI) 35.0-35.9, adult: Secondary | ICD-10-CM

## 2021-11-29 DIAGNOSIS — R079 Chest pain, unspecified: Secondary | ICD-10-CM | POA: Diagnosis not present

## 2021-11-29 DIAGNOSIS — R7989 Other specified abnormal findings of blood chemistry: Secondary | ICD-10-CM | POA: Diagnosis not present

## 2021-11-29 NOTE — Assessment & Plan Note (Signed)
Wt Readings from Last 3 Encounters:  11/29/21 276 lb (125.2 kg)  11/25/21 278 lb 12.8 oz (126.5 kg)  11/15/21 270 lb (122.5 kg)  Loosing wt

## 2021-11-29 NOTE — Assessment & Plan Note (Signed)
GERD related - better on Protonix and w/less wine consumption

## 2021-11-29 NOTE — Assessment & Plan Note (Signed)
Better w/less wine consumption 2/23

## 2021-11-29 NOTE — Progress Notes (Signed)
Subjective:  Patient ID: Zachary Valdez, male    DOB: 10-24-1950  Age: 71 y.o. MRN: [id number]  CC: Follow-up (Would like to discuss medications)   HPI Zachary Valdez presents for CAD - had CP and a heart cath. F/u on PAD C/o GERD - better on Protonix and w/less wine consumption  Outpatient Medications Prior to Visit  Medication Sig Dispense Refill   aspirin EC 81 MG tablet Take 81 mg by mouth daily. Swallow whole.     metFORMIN (GLUCOPHAGE XR) 750 MG 24 hr tablet Take 1 tablet (750 mg total) by mouth daily with breakfast. 90 tablet 3   olmesartan (BENICAR) 40 MG tablet TAKE ONE TABLET BY MOUTH DAILY 90 tablet 1   pantoprazole (PROTONIX) 40 MG tablet TAKE ONE TABLET BY MOUTH DAILY AS NEEDED (Patient taking differently: daily.) 90 tablet 3   rosuvastatin (CRESTOR) 20 MG tablet Take 20 mg by mouth daily.     vitamin B-12 (CYANOCOBALAMIN) 1000 MCG tablet Take 1 tablet (1,000 mcg total) by mouth daily. 100 tablet 3   isosorbide mononitrate (IMDUR) 30 MG 24 hr tablet Take 1 tablet (30 mg total) by mouth daily. (Patient not taking: Reported on 11/29/2021) 90 tablet 3   nitroGLYCERIN (NITROSTAT) 0.4 MG SL tablet Place 1 tablet (0.4 mg total) under the tongue every 5 (five) minutes as needed for chest pain. (Patient not taking: Reported on 11/25/2021) 90 tablet 3   No facility-administered medications prior to visit.    ROS: Review of Systems  Constitutional:  Negative for appetite change, fatigue and unexpected weight change.  HENT:  Negative for congestion, nosebleeds, sneezing, sore throat and trouble swallowing.   Eyes:  Negative for itching and visual disturbance.  Respiratory:  Negative for cough.   Cardiovascular:  Positive for chest pain. Negative for palpitations and leg swelling.  Gastrointestinal:  Negative for abdominal distention, blood in stool, diarrhea and nausea.  Genitourinary:  Negative for frequency and hematuria.  Musculoskeletal:  Positive for arthralgias. Negative for  back pain, gait problem, joint swelling and neck pain.  Skin:  Negative for rash.  Neurological:  Negative for dizziness, tremors, speech difficulty and weakness.  Psychiatric/Behavioral:  Negative for agitation, dysphoric mood and sleep disturbance. The patient is not nervous/anxious.    Objective:  BP (!) 144/64 (BP Location: Left Arm, Patient Position: Sitting, Cuff Size: Large)    Pulse 94    Temp 98.3 F (36.8 C) (Oral)    Ht 5\' 11"  (1.803 m)    Wt 276 lb (125.2 kg)    SpO2 97%    BMI 38.49 kg/m   BP Readings from Last 3 Encounters:  11/29/21 (!) 144/64  11/25/21 (!) 170/76  11/15/21 (!) 155/84    Wt Readings from Last 3 Encounters:  11/29/21 276 lb (125.2 kg)  11/25/21 278 lb 12.8 oz (126.5 kg)  11/15/21 270 lb (122.5 kg)    Physical Exam Constitutional:      General: He is not in acute distress.    Appearance: He is well-developed. He is obese.     Comments: NAD  Eyes:     Conjunctiva/sclera: Conjunctivae normal.     Pupils: Pupils are equal, round, and reactive to light.  Neck:     Thyroid: No thyromegaly.     Vascular: No JVD.  Cardiovascular:     Rate and Rhythm: Normal rate and regular rhythm.     Heart sounds: Normal heart sounds. No murmur heard.   No friction rub. No gallop.  Pulmonary:     Effort: Pulmonary effort is normal. No respiratory distress.     Breath sounds: Normal breath sounds. No wheezing or rales.  Chest:     Chest wall: No tenderness.  Abdominal:     General: Bowel sounds are normal. There is no distension.     Palpations: Abdomen is soft. There is no mass.     Tenderness: There is no abdominal tenderness. There is no guarding or rebound.  Musculoskeletal:        General: No tenderness. Normal range of motion.     Cervical back: Normal range of motion.  Lymphadenopathy:     Cervical: No cervical adenopathy.  Skin:    General: Skin is warm and dry.     Findings: No rash.  Neurological:     Mental Status: He is alert and oriented to  person, place, and time.     Cranial Nerves: No cranial nerve deficit.     Motor: No abnormal muscle tone.     Coordination: Coordination normal.     Gait: Gait normal.     Deep Tendon Reflexes: Reflexes are normal and symmetric.  Psychiatric:        Behavior: Behavior normal.        Thought Content: Thought content normal.        Judgment: Judgment normal.    Lab Results  Component Value Date   WBC 6.7 11/11/2021   HGB 15.5 11/11/2021   HCT 45.5 11/11/2021   PLT 117 (L) 11/11/2021   GLUCOSE 108 (H) 11/11/2021   CHOL 206 (H) 09/23/2019   TRIG 149 09/23/2019   HDL 61 09/23/2019   LDLDIRECT 146.2 10/07/2010   LDLCALC 119 (H) 09/23/2019   ALT 32 07/19/2021   AST 43 (H) 07/19/2021   NA 140 11/11/2021   K 4.1 11/11/2021   CL 103 11/11/2021   CREATININE 0.92 11/11/2021   BUN 14 11/11/2021   CO2 19 (L) 11/11/2021   TSH 3.320 05/22/2019   PSA 0.59 03/29/2018   INR 1.1 03/04/2021   HGBA1C 7.0 (H) 07/19/2021    LE ART SEG MULTI (Segm & LE [place])  Result Date: 11/27/2021  LOWER EXTREMITY DOPPLER STUDY Patient Name:  Zachary Valdez  Date of Exam:   11/25/2021 Medical Rec #: [id number]      Accession #:    [id number] Date of Birth: August 25, 1951       Patient Gender: M Patient Age:   67 years Exam Location:  [facility] Procedure:      VAS [place] LOWER EXT ART SEG MULTI (SEGMENTALS & LE RAYNAUDS) Referring Phys: KARDIE TOBB --------------------------------------------------------------------------------  Indications: Claudication. Patient reports bilateral pain that starts in his              heels, moves to his ankles, and then up the calf. He says this has              worsened in the past few weeks. He is not able to walk very far or              exercise due to the foot pain. The feet also feel numb at times. High Risk Factors: Hypertension, Diabetes, past history of smoking, coronary                    artery disease.  Performing Technologist: Mariane Masters RVT  Examination Guidelines: A  complete evaluation includes at minimum, Doppler waveform signals and systolic blood pressure reading at the level  of bilateral brachial, anterior tibial, and posterior tibial arteries, when vessel segments are accessible. Bilateral testing is considered an integral part of a complete examination. Photoelectric Plethysmograph (PPG) waveforms and toe systolic pressure readings are included as required and additional duplex testing as needed. Limited examinations for reoccurring indications may be performed as noted.  ABI Findings: +---------+------------------+-----+----------+--------+  Right     Rt Pressure (mmHg) Index Waveform   Comment   +---------+------------------+-----+----------+--------+  Brachial  148                                           +---------+------------------+-----+----------+--------+  CFA                                triphasic            +---------+------------------+-----+----------+--------+  Popliteal                          monophasic           +---------+------------------+-----+----------+--------+  PTA       254                1.66  monophasic           +---------+------------------+-----+----------+--------+  PERO      254                1.66  monophasic           +---------+------------------+-----+----------+--------+  DP        249                1.63  monophasic           +---------+------------------+-----+----------+--------+  Great Toe 69                 0.45  Abnormal             +---------+------------------+-----+----------+--------+ +---------+------------------+-----+---------+-------+  Left      Lt Pressure (mmHg) Index Waveform  Comment  +---------+------------------+-----+---------+-------+  Brachial  153                                         +---------+------------------+-----+---------+-------+  CFA                                triphasic          +---------+------------------+-----+---------+-------+  Popliteal                          triphasic           +---------+------------------+-----+---------+-------+  PTA       254                1.66  triphasic          +---------+------------------+-----+---------+-------+  PERO      254                1.72  triphasic          +---------+------------------+-----+---------+-------+  DP        245  1.60  triphasic          +---------+------------------+-----+---------+-------+  Great Toe 103                0.67  Abnormal           +---------+------------------+-----+---------+-------+ +-------+-----------+-----------+------------+------------+  ABI/TBI Today's ABI Today's TBI Previous ABI Previous TBI  +-------+-----------+-----------+------------+------------+  Right   [place]          0.45                                   +-------+-----------+-----------+------------+------------+  Left    [place]          0.67                                   +-------+-----------+-----------+------------+------------+ Arterial wall calcification precludes accurate ankle pressures and ABIs.  Summary: Right: Resting right ankle-brachial index indicates noncompressible right lower extremity arteries. The right toe-brachial index is abnormal. Left: Resting left ankle-brachial index indicates noncompressible left lower extremity arteries. The left toe-brachial index is abnormal.  *See table(s) above for measurements and observations. See arterial duplex report.  Vascular consult recommended. Electronically signed by Carlyle Dolly MD on 11/27/2021 at 9:12:00 AM.    Final    VAS [place] LOWER EXTREMITY ARTERIAL DUPLEX  Result Date: 11/27/2021 LOWER EXTREMITY ARTERIAL DUPLEX STUDY Patient Name:  Zachary Valdez  Date of Exam:   11/25/2021 Medical Rec #: [id number]      Accession #:    [id number] Date of Birth: [birth year]       Patient Gender: M Patient Age:   65 years Exam Location:  [facility] Procedure:      VAS [place] LOWER EXTREMITY ARTERIAL DUPLEX Referring Phys: KARDIE TOBB  --------------------------------------------------------------------------------  Indications: Claudication, and Patient reports bilateral pain that starts in his              heels, moves to his ankles, and then up the calf. He says this has              worsened in the past few weeks. He is not able to walk very far or              exercise due to the foot pain. The feet also feel numb at times. High Risk Factors: Hypertension, Diabetes, past history of smoking, coronary                    artery disease.  Current ABI: Today's ABIs are non-compressible bilaterally Performing Technologist: Mariane Masters RVT  Examination Guidelines: A complete evaluation includes B-mode imaging, spectral Doppler, color Doppler, and power Doppler as needed of all accessible portions of each vessel. Bilateral testing is considered an integral part of a complete examination. Limited examinations for reoccurring indications may be performed as noted.  +-----------+--------+-----+---------------+----------+--------+  RIGHT       PSV cm/s Ratio Stenosis        Waveform   Comments  +-----------+--------+-----+---------------+----------+--------+  CFA Prox    171            30-49% stenosis triphasic            +-----------+--------+-----+---------------+----------+--------+  DFA         120  triphasic            +-----------+--------+-----+---------------+----------+--------+  SFA Prox    78                             triphasic            +-----------+--------+-----+---------------+----------+--------+  SFA Mid     58                             monophasic           +-----------+--------+-----+---------------+----------+--------+  SFA Distal  0              occluded                             +-----------+--------+-----+---------------+----------+--------+  POP Prox    19                             monophasic           +-----------+--------+-----+---------------+----------+--------+  POP Distal  39                              monophasic           +-----------+--------+-----+---------------+----------+--------+  TP Trunk    39                             monophasic           +-----------+--------+-----+---------------+----------+--------+  ATA Distal  36                             monophasic           +-----------+--------+-----+---------------+----------+--------+  PTA Distal  50                             monophasic           +-----------+--------+-----+---------------+----------+--------+  PERO Distal 0              occluded                             +-----------+--------+-----+---------------+----------+--------+ Heavily calcified vessel walls and atherosclerosis. Bulky plaque with no flow detected in the distal superficial femoral artery. Occlusion of the distal peroneal artery.  +-----------+--------+-----+---------------+---------+--------+  LEFT        PSV cm/s Ratio Stenosis        Waveform  Comments  +-----------+--------+-----+---------------+---------+--------+  CFA Prox    143                            triphasic           +-----------+--------+-----+---------------+---------+--------+  DFA         75                             biphasic            +-----------+--------+-----+---------------+---------+--------+  SFA Prox    78  triphasic           +-----------+--------+-----+---------------+---------+--------+  SFA Mid     129                            triphasic           +-----------+--------+-----+---------------+---------+--------+  SFA Distal  354      3.3   50-74% stenosis stenotic            +-----------+--------+-----+---------------+---------+--------+  POP Prox    79                             triphasic           +-----------+--------+-----+---------------+---------+--------+  POP Distal  96                             triphasic           +-----------+--------+-----+---------------+---------+--------+  TP Trunk    88                             triphasic            +-----------+--------+-----+---------------+---------+--------+  ATA Distal  70                             triphasic           +-----------+--------+-----+---------------+---------+--------+  PTA Distal  50                             triphasic           +-----------+--------+-----+---------------+---------+--------+  PERO Distal 0              occluded                            +-----------+--------+-----+---------------+---------+--------+ A focal velocity elevation of 354 cm/s was obtained at distal SFA with post stenotic turbulence with a VR of 3.3. Findings are characteristic of 50-74% stenosis.  Summary: Right: Total occlusion noted in the distal superficial femoral artery. Two-vessel runoff. Occlusion of the distal peroneal artery. Heavily calcified vessel walls and athersclerosis throughout the right lower extremity arteries. Left: 50-74% stenosis noted in the distal superficial femoral artery. Two-vessel runoff. Occlusion of the distal peroneal artery. Heavily calcified vessel walls and atherosclerosis throughout the left lower extremity arteries.  See table(s) above for measurements and observations. See ABI report. Vascular consult recommended. Electronically signed by Carlyle Dolly MD on 11/27/2021 at 9:16:32 AM.    Final     Assessment & Plan:   Problem List Items Addressed This Visit     Coronary artery disease involving native coronary artery of native heart with angina pectoris (HCC) (Chronic)    On Crestor  ASA 2/23 LEFT HEART CATH AND CORONARY ANGIOGRAPHY   Conclusion      LCx is diffusely diseased 30 to 60%   3rd Mrg lesion is 100% stenosed. -Small caliber vessel, CTO with left to left collaterals filling the distal vessel.   Mid LAD lesion is 40% stenosed -immediately after 2nd Diag.   Mid LAD to Dist LAD lesion is 45% stenosed -> diffusely diseased with no focal stenosis.  Tapers to small caliber  apical vessel.   1st Diag - > ostial lesion lesion is 50% stenosed.   2nd  Diag -> proximal lesion is 65% stenosed.   Prox RCA lesion is 25% stenosed with 70% stenosed side branch in RV Branch.   Mid RCA to Dist RCA lesion is 25% stenosed.   LV end diastolic pressure is moderately elevated.   SUMMARY Diffuse mild to moderate disease with relatively small caliber vessels. Likely CTO of OM 3, (~1.5 mm vessel -> not a PCI target) Moderate disease in RCA and LCx as well as moderate to severe disease in small D2.  No obvious PCI options. . Moderately elevated LVEDP after hydration   Glenetta Hew, MD       CHEST PAIN    GERD related - better on Protonix and w/less wine consumption      Elevated LFTs    Better w/less wine consumption 2/23      GERD    Better w/less wine consumption 2/23      Obesity    Wt Readings from Last 3 Encounters:  11/29/21 276 lb (125.2 kg)  11/25/21 278 lb 12.8 oz (126.5 kg)  11/15/21 270 lb (122.5 kg)  Loosing wt       PAD (peripheral artery disease) ([place])    2/24 Doppler US  Summary:  Right: Total occlusion noted in the distal superficial femoral artery.  Two-vessel runoff. Occlusion of the distal peroneal artery. Heavily  calcified vessel walls and athersclerosis throughout the right lower  extremity arteries.   Left: 50-74% stenosis noted in the distal superficial femoral artery.  Two-vessel runoff. Occlusion of the distal peroneal artery. Heavily  calcified vessel walls and atherosclerosis throughout the left lower  extremity arteries.   Appt w/dr Gwenlyn Found         No orders of the defined types were placed in this encounter.     Follow-up: Return in about 3 months (around 02/26/2022) for a follow-up visit.  Walker Kehr, MD

## 2021-11-29 NOTE — Assessment & Plan Note (Addendum)
2/24 Doppler US  Summary:  Right: Total occlusion noted in the distal superficial femoral artery.  Two-vessel runoff. Occlusion of the distal peroneal artery. Heavily  calcified vessel walls and athersclerosis throughout the right lower  extremity arteries.   Left: 50-74% stenosis noted in the distal superficial femoral artery.  Two-vessel runoff. Occlusion of the distal peroneal artery. Heavily  calcified vessel walls and atherosclerosis throughout the left lower  extremity arteries.   Appt w/dr Gwenlyn Found

## 2021-11-29 NOTE — Assessment & Plan Note (Signed)
On Crestor  ASA 2/23 LEFT HEART CATH AND CORONARY ANGIOGRAPHY   Conclusion     LCx is diffusely diseased 30 to 60%   3rd Mrg lesion is 100% stenosed. -Small caliber vessel, CTO with left to left collaterals filling the distal vessel.   Mid LAD lesion is 40% stenosed -immediately after 2nd Diag.   Mid LAD to Dist LAD lesion is 45% stenosed -> diffusely diseased with no focal stenosis.  Tapers to small caliber apical vessel.   1st Diag - > ostial lesion lesion is 50% stenosed.   2nd Diag -> proximal lesion is 65% stenosed.   Prox RCA lesion is 25% stenosed with 70% stenosed side branch in RV Branch.   Mid RCA to Dist RCA lesion is 25% stenosed.   LV end diastolic pressure is moderately elevated.  SUMMARY  Diffuse mild to moderate disease with relatively small caliber vessels. ? Likely CTO of OM 3, (~1.5 mm vessel -> not a PCI target) ? Moderate disease in RCA and LCx as well as moderate to severe disease in small D2.  No obvious PCI options. .  Moderately elevated LVEDP after hydration  Glenetta Hew, MD

## 2021-12-05 ENCOUNTER — Ambulatory Visit (INDEPENDENT_AMBULATORY_CARE_PROVIDER_SITE_OTHER): Payer: Medicare HMO

## 2021-12-05 DIAGNOSIS — Z0000 Encounter for general adult medical examination without abnormal findings: Secondary | ICD-10-CM | POA: Diagnosis not present

## 2021-12-05 NOTE — Patient Instructions (Signed)
Zachary Valdez , Thank you for taking time to come for your Medicare Wellness Visit. I appreciate your ongoing commitment to your health goals. Please review the following plan we discussed and let me know if I can assist you in the future.   Screening recommendations/referrals: Colonoscopy: Due in 2024 per patient  Recommended yearly ophthalmology/optometry visit for glaucoma screening and checkup Recommended yearly dental visit for hygiene and checkup  Vaccinations: Influenza vaccine: completed  Pneumococcal vaccine: completed  Tdap vaccine: 07/06/2013 Shingles vaccine: will consider   Advanced directives: yes   Conditions/risks identified: none   Next appointment: none   Preventive Care 61 Years and Older, Male Preventive care refers to lifestyle choices and visits with your health care provider that can promote health and wellness. What does preventive care include? A yearly physical exam. This is also called an annual well check. Dental exams once or twice a year. Routine eye exams. Ask your health care provider how often you should have your eyes checked. Personal lifestyle choices, including: Daily care of your teeth and gums. Regular physical activity. Eating a healthy diet. Avoiding tobacco and drug use. Limiting alcohol use. Practicing safe sex. Taking low doses of aspirin every day. Taking vitamin and mineral supplements as recommended by your health care provider. What happens during an annual well check? The services and screenings done by your health care provider during your annual well check will depend on your age, overall health, lifestyle risk factors, and family history of disease. Counseling  Your health care provider may ask you questions about your: Alcohol use. Tobacco use. Drug use. Emotional well-being. Home and relationship well-being. Sexual activity. Eating habits. History of falls. Memory and ability to understand (cognition). Work and work  Statistician. Screening  You may have the following tests or measurements: Height, weight, and BMI. Blood pressure. Lipid and cholesterol levels. These may be checked every 5 years, or more frequently if you are over 68 years old. Skin check. Lung cancer screening. You may have this screening every year starting at age 83 if you have a 30-pack-year history of smoking and currently smoke or have quit within the past 15 years. Fecal occult blood test (FOBT) of the stool. You may have this test every year starting at age 72. Flexible sigmoidoscopy or colonoscopy. You may have a sigmoidoscopy every 5 years or a colonoscopy every 10 years starting at age 56. Prostate cancer screening. Recommendations will vary depending on your family history and other risks. Hepatitis C blood test. Hepatitis B blood test. Sexually transmitted disease (STD) testing. Diabetes screening. This is done by checking your blood sugar (glucose) after you have not eaten for a while (fasting). You may have this done every 1-3 years. Abdominal aortic aneurysm (AAA) screening. You may need this if you are a current or former smoker. Osteoporosis. You may be screened starting at age 89 if you are at high risk. Talk with your health care provider about your test results, treatment options, and if necessary, the need for more tests. Vaccines  Your health care provider may recommend certain vaccines, such as: Influenza vaccine. This is recommended every year. Tetanus, diphtheria, and acellular pertussis (Tdap, Td) vaccine. You may need a Td booster every 10 years. Zoster vaccine. You may need this after age 80. Pneumococcal 13-valent conjugate (PCV13) vaccine. One dose is recommended after age 42. Pneumococcal polysaccharide (PPSV23) vaccine. One dose is recommended after age 1. Talk to your health care provider about which screenings and vaccines you need and  how often you need them. This information is not intended to replace  advice given to you by your health care provider. Make sure you discuss any questions you have with your health care provider. Document Released: 10/15/2015 Document Revised: 06/07/2016 Document Reviewed: 07/20/2015 Elsevier Interactive Patient Education  2017 [place] Prevention in the Home Falls can cause injuries. They can happen to people of all ages. There are many things you can do to make your home safe and to help prevent falls. What can I do on the outside of my home? Regularly fix the edges of walkways and driveways and fix any cracks. Remove anything that might make you trip as you walk through a door, such as a raised step or threshold. Trim any bushes or trees on the path to your home. Use bright outdoor lighting. Clear any walking paths of anything that might make someone trip, such as rocks or tools. Regularly check to see if handrails are loose or broken. Make sure that both sides of any steps have handrails. Any raised decks and porches should have guardrails on the edges. Have any leaves, snow, or ice cleared regularly. Use sand or salt on walking paths during winter. Clean up any spills in your garage right away. This includes oil or grease spills. What can I do in the bathroom? Use night lights. Install grab bars by the toilet and in the tub and shower. Do not use towel bars as grab bars. Use non-skid mats or decals in the tub or shower. If you need to sit down in the shower, use a plastic, non-slip stool. Keep the floor dry. Clean up any water that spills on the floor as soon as it happens. Remove soap buildup in the tub or shower regularly. Attach bath mats securely with double-sided non-slip rug tape. Do not have throw rugs and other things on the floor that can make you trip. What can I do in the bedroom? Use night lights. Make sure that you have a light by your bed that is easy to reach. Do not use any sheets or blankets that are too big for your bed.  They should not hang down onto the floor. Have a firm chair that has side arms. You can use this for support while you get dressed. Do not have throw rugs and other things on the floor that can make you trip. What can I do in the kitchen? Clean up any spills right away. Avoid walking on wet floors. Keep items that you use a lot in easy-to-reach places. If you need to reach something above you, use a strong step stool that has a grab bar. Keep electrical cords out of the way. Do not use floor polish or wax that makes floors slippery. If you must use wax, use non-skid floor wax. Do not have throw rugs and other things on the floor that can make you trip. What can I do with my stairs? Do not leave any items on the stairs. Make sure that there are handrails on both sides of the stairs and use them. Fix handrails that are broken or loose. Make sure that handrails are as long as the stairways. Check any carpeting to make sure that it is firmly attached to the stairs. Fix any carpet that is loose or worn. Avoid having throw rugs at the top or bottom of the stairs. If you do have throw rugs, attach them to the floor with carpet tape. Make sure that you have  a light switch at the top of the stairs and the bottom of the stairs. If you do not have them, ask someone to add them for you. What else can I do to help prevent falls? Wear shoes that: Do not have high heels. Have rubber bottoms. Are comfortable and fit you well. Are closed at the toe. Do not wear sandals. If you use a stepladder: Make sure that it is fully opened. Do not climb a closed stepladder. Make sure that both sides of the stepladder are locked into place. Ask someone to hold it for you, if possible. Clearly mark and make sure that you can see: Any grab bars or handrails. First and last steps. Where the edge of each step is. Use tools that help you move around (mobility aids) if they are needed. These  include: Canes. Walkers. Scooters. Crutches. Turn on the lights when you go into a dark area. Replace any light bulbs as soon as they burn out. Set up your furniture so you have a clear path. Avoid moving your furniture around. If any of your floors are uneven, fix them. If there are any pets around you, be aware of where they are. Review your medicines with your doctor. Some medicines can make you feel dizzy. This can increase your chance of falling. Ask your doctor what other things that you can do to help prevent falls. This information is not intended to replace advice given to you by your health care provider. Make sure you discuss any questions you have with your health care provider. Document Released: 07/15/2009 Document Revised: 02/24/2016 Document Reviewed: 10/23/2014 Elsevier Interactive Patient Education  2017 [employer].

## 2021-12-05 NOTE — Progress Notes (Addendum)
Subjective:   JEROME VIGLIONE is a 71 y.o. male who presents for an Subsequent Medicare Annual Wellness Visit.   I connected with Virgel Gess today by telephone and verified that I am speaking with the correct person using two identifiers. Location patient: home Location provider: work Persons participating in the virtual visit: patient, provider.   I discussed the limitations, risks, security and privacy concerns of performing an evaluation and management service by telephone and the availability of in person appointments. I also discussed with the patient that there may be a patient responsible charge related to this service. The patient expressed understanding and verbally consented to this telephonic visit.    Interactive audio and video telecommunications were attempted between this provider and patient, however failed, due to patient having technical difficulties OR patient did not have access to video capability.  We continued and completed visit with audio only.    Review of Systems     Cardiac Risk Factors include: advanced age (>22mn, >>77women);male gender     Objective:    Today's Vitals   There is no height or weight on file to calculate BMI.  Advanced Directives 12/05/2021 11/15/2021 11/03/2021 03/14/2021 03/04/2021 12/02/2020 06/03/2019  Does Patient Have a Medical Advance Directive? Yes Yes No Yes Yes Yes Yes  Type of AParamedicof A[place]ckervilleLiving will [place] GroveLiving will - [place]Living will [place]itrus HillsLiving will - [place]Nav[place]Living will  Does patient want to make changes to medical advance directive? - - - No - Patient declined - No - Patient declined -  Copy of HHoffmanin Chart? No - copy requested - - No - copy requested - - No - copy requested  Would patient like information on creating a medical advance directive? - - - - - - -    Current  Medications (verified) Outpatient Encounter Medications as of 12/05/2021  Medication Sig   aspirin EC 81 MG tablet Take 81 mg by mouth daily. Swallow whole.   isosorbide mononitrate (IMDUR) 30 MG 24 hr tablet Take 1 tablet (30 mg total) by mouth daily.   metFORMIN (GLUCOPHAGE XR) 750 MG 24 hr tablet Take 1 tablet (750 mg total) by mouth daily with breakfast.   nitroGLYCERIN (NITROSTAT) 0.4 MG SL tablet Place 1 tablet (0.4 mg total) under the tongue every 5 (five) minutes as needed for chest pain.   olmesartan (BENICAR) 40 MG tablet TAKE ONE TABLET BY MOUTH DAILY   pantoprazole (PROTONIX) 40 MG tablet TAKE ONE TABLET BY MOUTH DAILY AS NEEDED (Patient taking differently: daily.)   rosuvastatin (CRESTOR) 20 MG tablet Take 20 mg by mouth daily.   vitamin B-12 (CYANOCOBALAMIN) 1000 MCG tablet Take 1 tablet (1,000 mcg total) by mouth daily.   No facility-administered encounter medications on file as of 12/05/2021.    Allergies (verified) Hydrocodone-acetaminophen, Atorvastatin, Candesartan, Dilaudid [hydromorphone], Enalapril, Losartan, Nexium [esomeprazole], Tramadol, and Wellbutrin [bupropion]   History: Past Medical History:  Diagnosis Date   Allergic rhinitis    Anxiety    Diabetes mellitus without complication (HCC)    GERD (gastroesophageal reflux disease)    Hyperlipidemia    Hyperplastic colon polyp 2006   Hypertension    OA (osteoarthritis)    knees,    Obesity    Sleep apnea    cpap-    Ulcerative esophagitis 2006   Dr. SFuller Plan  Vitamin B 12 deficiency    Past Surgical History:  Procedure Laterality Date   KNEE ARTHROSCOPY     Right   LEFT HEART CATH AND CORONARY ANGIOGRAPHY N/A 11/15/2021   Procedure: LEFT HEART CATH AND CORONARY ANGIOGRAPHY;  Surgeon: Leonie Man, MD;  Location: [facility];  Service: Cardiovascular;  Laterality: N/A;   TOTAL KNEE ARTHROPLASTY Left 09/07/2014   Procedure: LEFT TOTAL KNEE ARTHROPLASTY;  Surgeon: Gearlean Alf, MD;  Location: [facility];  Service: Orthopedics;  Laterality: Left;   TOTAL KNEE ARTHROPLASTY Right 03/14/2021   Procedure: TOTAL KNEE ARTHROPLASTY;  Surgeon: Gaynelle Arabian, MD;  Location: [facility];  Service: Orthopedics;  Laterality: Right;  26mn   Family History  Problem Relation Age of Onset   Breast cancer Mother    Hypertension Mother    Hypertension Other    Colon cancer Neg Hx    Social History   Socioeconomic History   Marital status: Divorced    Spouse name: Not on file   Number of children: Not on file   Years of education: Not on file   Highest education level: Not on file  Occupational History   Occupation: retired  Tobacco Use   Smoking status: Former    Packs/day: 1.50    Years: 15.00    Pack years: 22.50    Types: Cigarettes    Quit date: 10/02/1997    Years since quitting: 24.1   Smokeless tobacco: Never  Vaping Use   Vaping Use: Never used  Substance and Sexual Activity   Alcohol use: Yes    Alcohol/week: 14.0 standard drinks    Types: 14 Glasses of wine per week   Drug use: No   Sexual activity: Not Currently  Other Topics Concern   Not on file  Social History Narrative   Daily caffeine- 2-3 per day   Social Determinants of Health   Financial Resource Strain: Low Risk    Difficulty of Paying Living Expenses: Not hard at all  Food Insecurity: No Food Insecurity   Worried About RCharity fundraiserin the Last Year: Never true   Ran Out of Food in the Last Year: Never true  Transportation Needs: No Transportation Needs   Lack of Transportation (Medical): No   Lack of Transportation (Non-Medical): No  Physical Activity: Sufficiently Active   Days of Exercise per Week: 5 days   Minutes of Exercise per Session: 30 min  Stress: No Stress Concern Present   Feeling of Stress : Not at all  Social Connections: Socially Isolated   Frequency of Communication with Friends and Family: Twice a week   Frequency of Social Gatherings with Friends and Family: Twice a week   Attends  Religious Services: Never   AMarine scientistor Organizations: No   Attends CMusic therapist Never   Marital Status: Separated    Tobacco Counseling Counseling given: Not Answered   Clinical Intake:  Pre-visit preparation completed: Yes  Pain : No/denies pain     Diabetes: No  How often do you need to have someone help you when you read instructions, pamphlets, or other written materials from your doctor or pharmacy?: 1 - Never What is the last grade level you completed in school?: [place]  Interpreter Needed?: No  Information entered by :: L.Tenesha Garza,LPN   Activities of Daily Living In your present state of health, do you have any difficulty performing the following activities: 12/05/2021 03/14/2021  Hearing? N -  Vision? N -  Difficulty concentrating or making  decisions? N -  Walking or climbing stairs? N -  Comment - -  Dressing or bathing? N -  Doing errands, shopping? N N  Preparing Food and eating ? N -  Using the Toilet? N -  In the past six months, have you accidently leaked urine? N -  Do you have problems with loss of bowel control? N -  Managing your Medications? N -  Managing your Finances? N -  Housekeeping or managing your Housekeeping? N -  Some recent data might be hidden    Patient Care Team: Plotnikov, Evie Lacks, MD as PCP - General Berniece Salines, DO as PCP - Cardiology (Cardiology) Clent Jacks, MD as Consulting Physician (Ophthalmology) Gaynelle Arabian, MD as Consulting Physician (Orthopedic Surgery)  Indicate any recent Medical Services you may have received from other than [facility] providers in the past year (date may be approximate).     Assessment:   This is a routine wellness examination for Omer.  Hearing/Vision screen Vision Screening - Comments:: Annual eye exams wear glasses   Dietary issues and exercise activities discussed: Current Exercise Habits: Home exercise routine, Type of exercise: walking,  Time (Minutes): 30, Frequency (Times/Week): 3, Weekly Exercise (Minutes/Week): 90, Intensity: Mild, Exercise limited by: None identified   Goals Addressed             This Visit's Progress    Patient Stated   On track    I want to continue to lose weight. I will continue going to Dr. Leafy Ro and I want to get my knee to feeling better.        Depression Screen PHQ 2/9 Scores 12/05/2021 12/05/2021 11/29/2021 12/02/2020 06/03/2019 12/16/2018 03/12/2018  PHQ - 2 Score 0 0 0 0 1 3 0  PHQ- 9 Score - - - - - 10 -    Fall Risk Fall Risk  12/05/2021 11/29/2021 12/02/2020 06/03/2019 03/12/2018  Falls in the past year? 0 0 0 0 No  Number falls in past yr: 0 0 0 0 -  Injury with Fall? 0 0 0 0 -  Risk for fall due to : No Fall Risks - No Fall Risks - -  Follow up Falls evaluation completed - Falls evaluation completed - -    FALL RISK PREVENTION PERTAINING TO THE HOME:  Any stairs in or around the home?  no If so, are there any without handrails? No  Home free of loose throw rugs in walkways, pet beds, electrical cords, etc? Yes  Adequate lighting in your home to reduce risk of falls? Yes   ASSISTIVE DEVICES UTILIZED TO PREVENT FALLS:  Life alert? No  Use of a cane, walker or w/c? No  Grab bars in the bathroom? No  Shower chair or bench in shower? No  Elevated toilet seat or a handicapped toilet? No    Cognitive Function:    Normal cognitive status assessed by direct observation by this Nurse Health Advisor. No abnormalities found.      Immunizations Immunization History  Administered Date(s) Administered   Fluad Quad(high Dose 65+) 07/28/2020, 07/20/2021   Influenza Whole 10/03/2005, 07/03/2011   Influenza,inj,Quad PF,6+ Mos 07/12/2019   Influenza,inj,quad, With Preservative 07/09/2019   Influenza-Unspecified 07/10/2016, 07/19/2017, 07/04/2018   PFIZER(Purple Top)SARS-COV-2 Vaccination 11/10/2019, 12/04/2019   Td 04/30/1998, 10/02/2006   Tdap 07/06/2013    TDAP status: Up to  date  Flu Vaccine status: Up to date  Pneumococcal vaccine status: Up to date  Covid-19 vaccine status: Completed vaccines  Qualifies for Shingles  Vaccine? Yes   Zostavax completed No   Shingrix Completed?: No.    Education has been provided regarding the importance of this vaccine. Patient has been advised to call insurance company to determine out of pocket expense if they have not yet received this vaccine. Advised may also receive vaccine at local pharmacy or Health Dept. Verbalized acceptance and understanding.  Screening Tests Health Maintenance  Topic Date Due   Zoster Vaccines- Shingrix (1 of 2) Never done   Pneumonia Vaccine 21+ Years old (1 - PCV) Never done   COVID-19 Vaccine (3 - Pfizer risk series) 01/01/2020   COLONOSCOPY (Pts 45-17yr Insurance coverage will need to be confirmed)  06/07/2021   TETANUS/TDAP  07/07/2023   INFLUENZA VACCINE  Completed   Hepatitis C Screening  Completed   HPV VACCINES  Aged Out    Health Maintenance  Health Maintenance Due  Topic Date Due   Zoster Vaccines- Shingrix (1 of 2) Never done   Pneumonia Vaccine 68 Years old (1 - PCV) Never done   COVID-19 Vaccine (3 - Pfizer risk series) 01/01/2020   COLONOSCOPY (Pts 45-433yrInsurance coverage will need to be confirmed)  06/07/2021    Colorectal cancer screening: Type of screening: Colonoscopy. Completed 09/06/20176. Repeat every 6 years  Lung Cancer Screening: (Low Dose CT Chest recommended if Age 69-80ears, 30 pack-year currently smoking OR have quit w/in 15years.) does not qualify.   Lung Cancer Screening Referral: \n/a  Additional Screening:  Hepatitis C Screening: does not qualify; Completed 03/10/2016  Vision Screening: Recommended annual ophthalmology exams for early detection of glaucoma and other disorders of the eye. Is the patient up to date with their annual eye exam?  Yes  Who is the provider or what is the name of the office in which the patient attends annual eye  exams? [facility]  If pt is not established with a provider, would they like to be referred to a provider to establish care? No .   Dental Screening: Recommended annual dental exams for proper oral hygiene  Community Resource Referral / Chronic Care Management: CRR required this visit?  No   CCM required this visit?  No      Plan:     I have personally reviewed and noted the following in the patients chart:   Medical and social history Use of alcohol, tobacco or illicit drugs  Current medications and supplements including opioid prescriptions. Patient is not currently taking opioid prescriptions. Functional ability and status Nutritional status Physical activity Advanced directives List of other physicians Hospitalizations, surgeries, and ER visits in previous 12 months Vitals Screenings to include cognitive, depression, and falls Referrals and appointments  In addition, I have reviewed and discussed with patient certain preventive protocols, quality metrics, and best practice recommendations. A written personalized care plan for preventive services as well as general preventive health recommendations were provided to patient.     LaRandel PiggLPN   3/04/04/1286 Nurse Notes: none    Medical screening examination/treatment/procedure(s) were performed by non-physician practitioner and as supervising physician I was immediately available for consultation/collaboration.  I agree with above. AlLew DawesMD

## 2021-12-08 ENCOUNTER — Encounter: Payer: Self-pay | Admitting: Internal Medicine

## 2021-12-09 MED ORDER — ROSUVASTATIN CALCIUM 20 MG PO TABS: 20.0000 mg | ORAL_TABLET | Freq: Every day | ORAL | 1 refills | Status: DC

## 2021-12-16 ENCOUNTER — Ambulatory Visit: Payer: Medicare HMO | Admitting: Cardiovascular Disease

## 2021-12-16 ENCOUNTER — Encounter: Payer: Self-pay | Admitting: Cardiovascular Disease

## 2021-12-16 ENCOUNTER — Other Ambulatory Visit: Payer: Self-pay

## 2021-12-16 DIAGNOSIS — I739 Peripheral vascular disease, unspecified: Secondary | ICD-10-CM | POA: Diagnosis not present

## 2021-12-16 MED ORDER — CILOSTAZOL 50 MG PO TABS
50.0000 mg | ORAL_TABLET | Freq: Two times a day (BID) | ORAL | 3 refills | Status: DC
Start: 2021-12-16 — End: 2022-07-03

## 2021-12-16 NOTE — Patient Instructions (Signed)
Medication Instructions:  ?START Cilostazol (Pletal) 50 mg twice daily ? ?*If you need a refill on your cardiac medications before your next appointment, please call your pharmacy* ? ? ?Lab Work: ?None ordered ?If you have labs (blood work) drawn today and your tests are completely normal, you will receive your results only by: ?MyChart Message (if you have MyChart) OR ?A paper copy in the mail ?If you have any lab test that is abnormal or we need to change your treatment, we will call you to review the results. ? ? ?Testing/Procedures: ?None ordered ? ? ?Follow-Up: ?At [facility], you and your health needs are our priority.  As part of our continuing mission to provide you with exceptional heart care, we have created designated Provider Care Teams.  These Care Teams include your primary Cardiologist (physician) and Advanced Practice Providers (APPs -  Physician Assistants and Nurse Practitioners) who all work together to provide you with the care you need, when you need it. ? ?We recommend signing up for the patient portal called "MyChart".  Sign up information is provided on this After Visit Summary.  MyChart is used to connect with patients for Virtual Visits (Telemedicine).  Patients are able to view lab/test results, encounter notes, upcoming appointments, etc.  Non-urgent messages can be sent to your provider as well.   ?To learn more about what you can do with MyChart, go to [email or web address].   ? ?Your next appointment:   ?3 month(s) ? ?The format for your next appointment:   ?In Person ? ?Provider:   ?Dr. Gwenlyn Found ?

## 2021-12-16 NOTE — Assessment & Plan Note (Signed)
History of lifestyle-limiting claudication for the last 9 months which is fairly symmetric.  He does have a history of mild CAD as well as positive risk factors.  He had Doppler studies performed 11/21/2021 revealing occluded and occluded right SFA and high-grade left SFA stenosis.  We talked about intervention versus medical therapy.  He wishes to pursue the latter initially.  I am going to begin him on Pletal 50 mg p.o. twice daily and I will see him back in 3 months.  If he does not have any significant improvement in his claudication symptoms we will discuss endovascular revascularization. ?

## 2021-12-16 NOTE — Progress Notes (Signed)
? ? ? ?12/16/2021 ?DIONISIO ARAGONES   ?[birth year]  ?[id number] ? ?Primary Physician Plotnikov, Evie Lacks, MD ?Primary Cardiologist: Lorretta Harp MD Lupe Carney, [place] ? ?HPI:  Zachary Valdez is a 71 y.o. moderately overweight separated Caucasian male father of 49 daughter who is a retired Water engineer.  He was referred by his cardiologist, Dr. Harriet Masson , for evaluation of PAD.  His risk factors include treated hypertension, diabetes and hyperlipidemia.  He is never had a heart attack or stroke.  He did have a cardiac catheterization performed by Dr. Ellyn Hack 11/15/2021 revealing an occluded third obtuse marginal branch which filled by collaterals but otherwise nonobstructive CAD treated medically.  He had a right total knee replacement back in June 2022 and since that time has noticed lifestyle-limiting claudication.  He did have Doppler studies performed in our office 11/25/2021 revealing an occluded right SFA with high-grade left SFA stenosis. ? ? ?Current Meds  ?Medication Sig  ? aspirin EC 81 MG tablet Take 81 mg by mouth daily. Swallow whole.  ? cilostazol (PLETAL) 50 MG tablet Take 1 tablet (50 mg total) by mouth 2 (two) times daily.  ? isosorbide mononitrate (IMDUR) 30 MG 24 hr tablet Take 1 tablet (30 mg total) by mouth daily.  ? metFORMIN (GLUCOPHAGE XR) 750 MG 24 hr tablet Take 1 tablet (750 mg total) by mouth daily with breakfast.  ? nitroGLYCERIN (NITROSTAT) 0.4 MG SL tablet Place 1 tablet (0.4 mg total) under the tongue every 5 (five) minutes as needed for chest pain.  ? olmesartan (BENICAR) 40 MG tablet TAKE ONE TABLET BY MOUTH DAILY  ? pantoprazole (PROTONIX) 40 MG tablet TAKE ONE TABLET BY MOUTH DAILY AS NEEDED (Patient taking differently: daily.)  ? rosuvastatin (CRESTOR) 20 MG tablet Take 1 tablet (20 mg total) by mouth daily. Take 20 mg by mouth daily.  ? vitamin B-12 (CYANOCOBALAMIN) 1000 MCG tablet Take 1 tablet (1,000 mcg total) by mouth daily.  ?  ? ?Allergies  ?Allergen Reactions  ?  Hydrocodone-Acetaminophen Shortness Of Breath  ?  REACTION: SOB, patient states drug made patient feel weird   ? Atorvastatin   ?  REACTION: achy  ? Candesartan Cough  ? Dilaudid [Hydromorphone]   ?  hallucinations  ? Enalapril Cough  ? Losartan Cough  ? Nexium [Esomeprazole] Diarrhea  ? Tramadol   ?  headache  ? Wellbutrin [Bupropion] Anxiety  ?  Severe   ? ? ?Social History  ? ?Socioeconomic History  ? Marital status: Divorced  ?  Spouse name: Not on file  ? Number of children: Not on file  ? Years of education: Not on file  ? Highest education level: Not on file  ?Occupational History  ? Occupation: retired  ?Tobacco Use  ? Smoking status: Former  ?  Packs/day: 1.50  ?  Years: 15.00  ?  Pack years: 22.50  ?  Types: Cigarettes  ?  Quit date: 10/02/1997  ?  Years since quitting: 24.2  ? Smokeless tobacco: Never  ?Vaping Use  ? Vaping Use: Never used  ?Substance and Sexual Activity  ? Alcohol use: Yes  ?  Alcohol/week: 14.0 standard drinks  ?  Types: 14 Glasses of wine per week  ? Drug use: No  ? Sexual activity: Not Currently  ?Other Topics Concern  ? Not on file  ?Social History Narrative  ? Daily caffeine- 2-3 per day  ? ?Social Determinants of Health  ? ?Financial Resource Strain: Low Risk   ?  Difficulty of Paying Living Expenses: Not hard at all  ?Food Insecurity: No Food Insecurity  ? Worried About Charity fundraiser in the Last Year: Never true  ? Ran Out of Food in the Last Year: Never true  ?Transportation Needs: No Transportation Needs  ? Lack of Transportation (Medical): No  ? Lack of Transportation (Non-Medical): No  ?Physical Activity: Sufficiently Active  ? Days of Exercise per Week: 5 days  ? Minutes of Exercise per Session: 30 min  ?Stress: No Stress Concern Present  ? Feeling of Stress : Not at all  ?Social Connections: Socially Isolated  ? Frequency of Communication with Friends and Family: Twice a week  ? Frequency of Social Gatherings with Friends and Family: Twice a week  ? Attends Religious  Services: Never  ? Active Member of Clubs or Organizations: No  ? Attends Archivist Meetings: Never  ? Marital Status: Separated  ?Intimate Partner Violence: Not At Risk  ? Fear of Current or Ex-Partner: No  ? Emotionally Abused: No  ? Physically Abused: No  ? Sexually Abused: No  ?  ? ?Review of Systems: ?General: negative for chills, fever, night sweats or weight changes.  ?Cardiovascular: negative for chest pain, dyspnea on exertion, edema, orthopnea, palpitations, paroxysmal nocturnal dyspnea or shortness of breath ?Dermatological: negative for rash ?Respiratory: negative for cough or wheezing ?Urologic: negative for hematuria ?Abdominal: negative for nausea, vomiting, diarrhea, bright red blood per rectum, melena, or hematemesis ?Neurologic: negative for visual changes, syncope, or dizziness ?All other systems reviewed and are otherwise negative except as noted above. ? ? ? ?Blood pressure 138/80, pulse 100, height 6' (1.829 m), weight 278 lb 9.6 oz (126.4 kg), SpO2 95 %.  ?General appearance: alert and no distress ?Neck: no adenopathy, no carotid bruit, no JVD, supple, symmetrical, trachea midline, and thyroid not enlarged, symmetric, no tenderness/mass/nodules ?Lungs: clear to auscultation bilaterally ?Heart: regular rate and rhythm, S1, S2 normal, no murmur, click, rub or gallop ?Extremities: extremities normal, atraumatic, no cyanosis or edema ?Pulses: Pedal pulses ?Skin: Skin color, texture, turgor normal. No rashes or lesions ?Neurologic: Grossly normal ? ?EKG not performed today ? ?ASSESSMENT AND PLAN:  ? ?PAD (peripheral artery disease) ([place]) ?History of lifestyle-limiting claudication for the last 9 months which is fairly symmetric.  He does have a history of mild CAD as well as positive risk factors.  He had Doppler studies performed 11/21/2021 revealing occluded and occluded right SFA and high-grade left SFA stenosis.  We talked about intervention versus medical therapy.  He wishes to  pursue the latter initially.  I am going to begin him on Pletal 50 mg p.o. twice daily and I will see him back in 3 months.  If he does not have any significant improvement in his claudication symptoms we will discuss endovascular revascularization. ? ? ? ? ?Lorretta Harp MD FACP,FACC,FAHA, FSCAI ?12/16/2021 ?8:57 AM ?

## 2021-12-21 ENCOUNTER — Encounter: Payer: Self-pay | Admitting: Cardiovascular Disease

## 2022-02-13 ENCOUNTER — Other Ambulatory Visit: Payer: Self-pay | Admitting: Internal Medicine

## 2022-03-13 ENCOUNTER — Ambulatory Visit: Payer: Medicare HMO | Admitting: Cardiology

## 2022-03-13 DIAGNOSIS — H524 Presbyopia: Secondary | ICD-10-CM | POA: Diagnosis not present

## 2022-03-22 ENCOUNTER — Ambulatory Visit: Payer: Medicare HMO | Admitting: Cardiovascular Disease

## 2022-05-10 ENCOUNTER — Encounter (INDEPENDENT_AMBULATORY_CARE_PROVIDER_SITE_OTHER): Payer: Self-pay

## 2022-06-06 ENCOUNTER — Other Ambulatory Visit: Payer: Self-pay | Admitting: Internal Medicine

## 2022-06-21 ENCOUNTER — Telehealth: Payer: Self-pay

## 2022-06-21 DIAGNOSIS — R7303 Prediabetes: Secondary | ICD-10-CM

## 2022-06-21 DIAGNOSIS — E785 Hyperlipidemia, unspecified: Secondary | ICD-10-CM

## 2022-06-21 DIAGNOSIS — E538 Deficiency of other specified B group vitamins: Secondary | ICD-10-CM

## 2022-06-21 DIAGNOSIS — I25119 Atherosclerotic heart disease of native coronary artery with unspecified angina pectoris: Secondary | ICD-10-CM

## 2022-06-21 NOTE — Telephone Encounter (Signed)
Pt has Medicare for insurance../lmb 

## 2022-06-21 NOTE — Telephone Encounter (Signed)
Patient is scheduled for 10/2 for an annual physical, wants to know if he can do fasting labs a week prior.

## 2022-06-23 NOTE — Telephone Encounter (Signed)
Notified pt MD place orders will come next Thurs for labs.Marland KitchenJohny Valdez

## 2022-06-23 NOTE — Addendum Note (Signed)
Addended by: Cassandria Anger on: 06/23/2022 10:28 AM   Modules accepted: Orders

## 2022-06-23 NOTE — Telephone Encounter (Signed)
Okay.  I will order.  Thanks

## 2022-06-29 ENCOUNTER — Other Ambulatory Visit (INDEPENDENT_AMBULATORY_CARE_PROVIDER_SITE_OTHER): Payer: Medicare HMO

## 2022-06-29 DIAGNOSIS — R7303 Prediabetes: Secondary | ICD-10-CM

## 2022-06-29 DIAGNOSIS — E785 Hyperlipidemia, unspecified: Secondary | ICD-10-CM

## 2022-06-29 DIAGNOSIS — I25119 Atherosclerotic heart disease of native coronary artery with unspecified angina pectoris: Secondary | ICD-10-CM | POA: Diagnosis not present

## 2022-06-29 LAB — CBC WITH DIFFERENTIAL/PLATELET
Basophils Absolute: 0 10*3/uL (ref 0.0–0.1)
Basophils Relative: 0.6 % (ref 0.0–3.0)
Eosinophils Absolute: 0.2 10*3/uL (ref 0.0–0.7)
Eosinophils Relative: 4.5 % (ref 0.0–5.0)
HCT: 44.2 % (ref 39.0–52.0)
Hemoglobin: 15.3 g/dL (ref 13.0–17.0)
Lymphocytes Relative: 26.6 % (ref 12.0–46.0)
Lymphs Abs: 1.3 10*3/uL (ref 0.7–4.0)
MCHC: 34.7 g/dL (ref 30.0–36.0)
MCV: 102.7 fl — ABNORMAL HIGH (ref 78.0–100.0)
Monocytes Absolute: 0.6 10*3/uL (ref 0.1–1.0)
Monocytes Relative: 11.7 % (ref 3.0–12.0)
Neutro Abs: 2.8 10*3/uL (ref 1.4–7.7)
Neutrophils Relative %: 56.6 % (ref 43.0–77.0)
Platelets: 104 10*3/uL — ABNORMAL LOW (ref 150.0–400.0)
RBC: 4.31 Mil/uL (ref 4.22–5.81)
RDW: 12.9 % (ref 11.5–15.5)
WBC: 4.9 10*3/uL (ref 4.0–10.5)

## 2022-06-29 LAB — COMPREHENSIVE METABOLIC PANEL
ALT: 23 U/L (ref 0–53)
AST: 27 U/L (ref 0–37)
Albumin: 3.9 g/dL (ref 3.5–5.2)
Alkaline Phosphatase: 60 U/L (ref 39–117)
BUN: 16 mg/dL (ref 6–23)
CO2: 27 mEq/L (ref 19–32)
Calcium: 9.5 mg/dL (ref 8.4–10.5)
Chloride: 104 mEq/L (ref 96–112)
Creatinine, Ser: 0.95 mg/dL (ref 0.40–1.50)
GFR: 81.73 mL/min (ref >60.00)
Glucose, Bld: 111 mg/dL — ABNORMAL HIGH (ref 70–99)
Potassium: 4.1 mEq/L (ref 3.5–5.1)
Sodium: 139 mEq/L (ref 135–145)
Total Bilirubin: 0.9 mg/dL (ref 0.2–1.2)
Total Protein: 6.9 g/dL (ref 6.0–8.3)

## 2022-06-29 LAB — URINALYSIS
Bilirubin Urine: NEGATIVE
Hgb urine dipstick: NEGATIVE
Ketones, ur: NEGATIVE
Leukocytes,Ua: NEGATIVE
Nitrite: NEGATIVE
Specific Gravity, Urine: 1.02 (ref 1.000–1.030)
Total Protein, Urine: NEGATIVE
Urine Glucose: NEGATIVE
Urobilinogen, UA: 0.2 (ref 0.0–1.0)
pH: 6 (ref 5.0–8.0)

## 2022-06-29 LAB — LIPID PANEL
Cholesterol: 162 mg/dL (ref 0–200)
HDL: 47.9 mg/dL (ref >39.00)
LDL Cholesterol: 92 mg/dL (ref 0–99)
NonHDL: 113.65
Total CHOL/HDL Ratio: 3
Triglycerides: 106 mg/dL (ref 0.0–149.0)
VLDL: 21.2 mg/dL (ref 0.0–40.0)

## 2022-06-29 LAB — HEMOGLOBIN A1C: Hgb A1c MFr Bld: 5.9 % (ref 4.6–6.5)

## 2022-06-29 LAB — PSA: PSA: 0.74 ng/mL (ref 0.10–4.00)

## 2022-06-29 LAB — TSH: TSH: 3.51 u[IU]/mL (ref 0.35–5.50)

## 2022-07-03 ENCOUNTER — Encounter: Payer: Self-pay | Admitting: Internal Medicine

## 2022-07-03 ENCOUNTER — Ambulatory Visit (INDEPENDENT_AMBULATORY_CARE_PROVIDER_SITE_OTHER): Payer: Medicare HMO | Admitting: Internal Medicine

## 2022-07-03 VITALS — BP 142/70 | HR 82 | Temp 99.2°F | Ht 72.0 in | Wt 266.2 lb

## 2022-07-03 DIAGNOSIS — I739 Peripheral vascular disease, unspecified: Secondary | ICD-10-CM

## 2022-07-03 DIAGNOSIS — R7303 Prediabetes: Secondary | ICD-10-CM | POA: Diagnosis not present

## 2022-07-03 DIAGNOSIS — Z0000 Encounter for general adult medical examination without abnormal findings: Secondary | ICD-10-CM

## 2022-07-03 DIAGNOSIS — R7989 Other specified abnormal findings of blood chemistry: Secondary | ICD-10-CM | POA: Diagnosis not present

## 2022-07-03 MED ORDER — PENTOXIFYLLINE ER 400 MG PO TBCR: 400.0000 mg | EXTENDED_RELEASE_TABLET | Freq: Three times a day (TID) | ORAL | 11 refills | Status: DC

## 2022-07-03 MED ORDER — ASPIRIN 81 MG PO TBEC: 81.0000 mg | DELAYED_RELEASE_TABLET | Freq: Two times a day (BID) | ORAL | 3 refills | Status: AC

## 2022-07-03 MED ORDER — CLOBETASOL PROPIONATE 0.05 % EX OINT: 1.0000 | TOPICAL_OINTMENT | Freq: Two times a day (BID) | CUTANEOUS | 3 refills | Status: DC

## 2022-07-03 NOTE — Progress Notes (Addendum)
Subjective:  Patient ID: Zachary Valdez, male    DOB: 1951/02/18  Age: 71 y.o. MRN: [id number]  CC: Annual Exam   HPI Zachary Valdez presents for a well exam C/o a R foot pain w/exercise - has claudication  type sx's  Outpatient Medications Prior to Visit  Medication Sig Dispense Refill   isosorbide mononitrate (IMDUR) 30 MG 24 hr tablet Take 1 tablet (30 mg total) by mouth daily. 90 tablet 3   metFORMIN (GLUCOPHAGE XR) 750 MG 24 hr tablet Take 1 tablet (750 mg total) by mouth daily with breakfast. 90 tablet 3   olmesartan (BENICAR) 40 MG tablet TAKE ONE TABLET BY MOUTH DAILY 90 tablet 1   pantoprazole (PROTONIX) 40 MG tablet TAKE ONE TABLET BY MOUTH DAILY AS NEEDED (Patient taking differently: daily.) 90 tablet 3   rosuvastatin (CRESTOR) 20 MG tablet Take 1 tablet (20 mg total) by mouth daily. Take 20 mg by mouth daily. 90 tablet 1   vitamin B-12 (CYANOCOBALAMIN) 1000 MCG tablet Take 1 tablet (1,000 mcg total) by mouth daily. 100 tablet 3   aspirin EC 81 MG tablet Take 81 mg by mouth daily. Swallow whole.     cilostazol (PLETAL) 50 MG tablet Take 1 tablet (50 mg total) by mouth 2 (two) times daily. 60 tablet 3   nitroGLYCERIN (NITROSTAT) 0.4 MG SL tablet Place 1 tablet (0.4 mg total) under the tongue every 5 (five) minutes as needed for chest pain. 90 tablet 3   No facility-administered medications prior to visit.    ROS: Review of Systems  Constitutional:  Negative for appetite change, fatigue and unexpected weight change.  HENT:  Negative for congestion, nosebleeds, sneezing, sore throat and trouble swallowing.   Eyes:  Negative for itching and visual disturbance.  Respiratory:  Negative for cough.   Cardiovascular:  Negative for chest pain, palpitations and leg swelling.  Gastrointestinal:  Negative for abdominal distention, blood in stool, diarrhea and nausea.  Genitourinary:  Negative for frequency and hematuria.  Musculoskeletal:  Positive for gait problem. Negative for back  pain, joint swelling and neck pain.  Skin:  Positive for color change and rash.  Neurological:  Negative for dizziness, tremors, speech difficulty and weakness.  Psychiatric/Behavioral:  Negative for agitation, dysphoric mood, sleep disturbance and suicidal ideas. The patient is not nervous/anxious.     Objective:  BP (!) 142/70 (BP Location: Left Arm)   Pulse 82   Temp 99.2 F (37.3 C) (Oral)   Ht 6' (1.829 m)   Wt 266 lb 3.2 oz (120.7 kg)   SpO2 95%   BMI 36.10 kg/m   BP Readings from Last 3 Encounters:  07/03/22 (!) 142/70  12/16/21 138/80  11/29/21 (!) 144/64    Wt Readings from Last 3 Encounters:  07/03/22 266 lb 3.2 oz (120.7 kg)  12/16/21 278 lb 9.6 oz (126.4 kg)  11/29/21 276 lb (125.2 kg)    Physical Exam Constitutional:      General: He is not in acute distress.    Appearance: He is well-developed. He is obese.     Comments: NAD  Eyes:     Conjunctiva/sclera: Conjunctivae normal.     Pupils: Pupils are equal, round, and reactive to light.  Neck:     Thyroid: No thyromegaly.     Vascular: No JVD.  Cardiovascular:     Rate and Rhythm: Normal rate and regular rhythm.     Heart sounds: Normal heart sounds. No murmur heard.    No  friction rub. No gallop.  Pulmonary:     Effort: Pulmonary effort is normal. No respiratory distress.     Breath sounds: Normal breath sounds. No wheezing or rales.  Chest:     Chest wall: No tenderness.  Abdominal:     General: Bowel sounds are normal. There is no distension.     Palpations: Abdomen is soft. There is no mass.     Tenderness: There is no abdominal tenderness. There is no guarding or rebound.  Musculoskeletal:        General: No tenderness. Normal range of motion.     Cervical back: Normal range of motion.     Right lower leg: No edema.     Left lower leg: No edema.  Lymphadenopathy:     Cervical: No cervical adenopathy.  Skin:    General: Skin is warm and dry.     Findings: Rash present.  Neurological:      Mental Status: He is alert and oriented to person, place, and time.     Cranial Nerves: No cranial nerve deficit.     Motor: No abnormal muscle tone.     Coordination: Coordination normal.     Gait: Gait normal.     Deep Tendon Reflexes: Reflexes are normal and symmetric.  Psychiatric:        Behavior: Behavior normal.        Thought Content: Thought content normal.        Judgment: Judgment normal.    I spent 22 minutes in addition to time for CPX wellness examination in preparing to see the patient by review of recent labs, imaging and procedures, obtaining and reviewing separately obtained history, communicating with the patient, ordering medications, tests or procedures, and documenting clinical information in the EHR including the differential diagnosis, treatment, and any further evaluation and other management of PAD, rash, elev LFT        Lab Results  Component Value Date   WBC 4.9 06/29/2022   HGB 15.3 06/29/2022   HCT 44.2 06/29/2022   PLT 104.0 (L) 06/29/2022   GLUCOSE 111 (H) 06/29/2022   CHOL 162 06/29/2022   TRIG 106.0 06/29/2022   HDL 47.90 06/29/2022   LDLDIRECT 146.2 10/07/2010   LDLCALC 92 06/29/2022   ALT 23 06/29/2022   AST 27 06/29/2022   NA 139 06/29/2022   K 4.1 06/29/2022   CL 104 06/29/2022   CREATININE 0.95 06/29/2022   BUN 16 06/29/2022   CO2 27 06/29/2022   TSH 3.51 06/29/2022   PSA 0.74 06/29/2022   INR 1.1 03/04/2021   HGBA1C 5.9 06/29/2022    VAS [place] LOWER EXTREMITY ARTERIAL DUPLEX  Result Date: 11/27/2021 LOWER EXTREMITY ARTERIAL DUPLEX STUDY Patient Name:  Zachary Valdez  Date of Exam:   11/25/2021 Medical Rec #: [id number]      Accession #:    [id number] Date of Birth: 04/11/1951       Patient Gender: M Patient Age:   51 years Exam Location:  [facility] Procedure:      VAS [place] LOWER EXTREMITY ARTERIAL DUPLEX Referring Phys: KARDIE TOBB --------------------------------------------------------------------------------  Indications: Claudication, and  Patient reports bilateral pain that starts in his              heels, moves to his ankles, and then up the calf. He says this has              worsened in the past few weeks. He is not able to walk very far  or              exercise due to the foot pain. The feet also feel numb at times. High Risk Factors: Hypertension, Diabetes, past history of smoking, coronary                    artery disease.  Current ABI: Today's ABIs are non-compressible bilaterally Performing Technologist: Mariane Masters RVT  Examination Guidelines: A complete evaluation includes B-mode imaging, spectral Doppler, color Doppler, and power Doppler as needed of all accessible portions of each vessel. Bilateral testing is considered an integral part of a complete examination. Limited examinations for reoccurring indications may be performed as noted.  +-----------+--------+-----+---------------+----------+--------+ RIGHT      PSV cm/sRatioStenosis       Waveform  Comments +-----------+--------+-----+---------------+----------+--------+ CFA Prox   171          30-49% stenosistriphasic          +-----------+--------+-----+---------------+----------+--------+ DFA        120                         triphasic          +-----------+--------+-----+---------------+----------+--------+ SFA Prox   78                          triphasic          +-----------+--------+-----+---------------+----------+--------+ SFA Mid    58                          monophasic         +-----------+--------+-----+---------------+----------+--------+ SFA Distal 0            occluded                          +-----------+--------+-----+---------------+----------+--------+ POP Prox   19                          monophasic         +-----------+--------+-----+---------------+----------+--------+ POP Distal 39                          monophasic         +-----------+--------+-----+---------------+----------+--------+ TP Trunk    39                          monophasic         +-----------+--------+-----+---------------+----------+--------+ ATA Distal 36                          monophasic         +-----------+--------+-----+---------------+----------+--------+ PTA Distal 50                          monophasic         +-----------+--------+-----+---------------+----------+--------+ PERO Distal0            occluded                          +-----------+--------+-----+---------------+----------+--------+ Heavily calcified vessel walls and atherosclerosis. Bulky plaque with no flow detected in the distal superficial femoral artery. Occlusion of the distal peroneal artery.  +-----------+--------+-----+---------------+---------+--------+ LEFT       PSV cm/sRatioStenosis  Waveform Comments +-----------+--------+-----+---------------+---------+--------+ CFA Prox   143                         triphasic         +-----------+--------+-----+---------------+---------+--------+ DFA        75                          biphasic          +-----------+--------+-----+---------------+---------+--------+ SFA Prox   78                          triphasic         +-----------+--------+-----+---------------+---------+--------+ SFA Mid    129                         triphasic         +-----------+--------+-----+---------------+---------+--------+ SFA Distal 354     3.3  50-74% stenosisstenotic          +-----------+--------+-----+---------------+---------+--------+ POP Prox   79                          triphasic         +-----------+--------+-----+---------------+---------+--------+ POP Distal 96                          triphasic         +-----------+--------+-----+---------------+---------+--------+ TP Trunk   88                          triphasic         +-----------+--------+-----+---------------+---------+--------+ ATA Distal 70                           triphasic         +-----------+--------+-----+---------------+---------+--------+ PTA Distal 50                          triphasic         +-----------+--------+-----+---------------+---------+--------+ PERO Distal0            occluded                         +-----------+--------+-----+---------------+---------+--------+ A focal velocity elevation of 354 cm/s was obtained at distal SFA with post stenotic turbulence with a VR of 3.3. Findings are characteristic of 50-74% stenosis.  Summary: Right: Total occlusion noted in the distal superficial femoral artery. Two-vessel runoff. Occlusion of the distal peroneal artery. Heavily calcified vessel walls and athersclerosis throughout the right lower extremity arteries. Left: 50-74% stenosis noted in the distal superficial femoral artery. Two-vessel runoff. Occlusion of the distal peroneal artery. Heavily calcified vessel walls and atherosclerosis throughout the left lower extremity arteries.  See table(s) above for measurements and observations. See ABI report. Vascular consult recommended. Electronically signed by Carlyle Dolly MD on 11/27/2021 at 9:16:32 AM.    Final    LE ART SEG MULTI (Segm & LE [place]aud[place])  Result Date: 11/27/2021  LOWER EXTREMITY DOPPLER STUDY Patient Name:  KAEGAN HETTICH  Date of Exam:   11/25/2021 Medical Rec #: [id number]      Accession #:    [id number] Date of Birth: [birth year]       Patient Gender:  M Patient Age:   30 years Exam Location:  [facility] Procedure:      VAS [place] LOWER EXT ART SEG MULTI (SEGMENTALS & LE [place]AYNAUDS) Referring Phys: KARDIE TOBB --------------------------------------------------------------------------------  Indications: Claudication. Patient reports bilateral pain that starts in his              heels, moves to his ankles, and then up the calf. He says this has              worsened in the past few weeks. He is not able to walk very far or              exercise due to the foot pain. The feet also feel  numb at times. High Risk Factors: Hypertension, Diabetes, past history of smoking, coronary                    artery disease.  Performing Technologist: Mariane Masters RVT  Examination Guidelines: A complete evaluation includes at minimum, Doppler waveform signals and systolic blood pressure reading at the level of bilateral brachial, anterior tibial, and posterior tibial arteries, when vessel segments are accessible. Bilateral testing is considered an integral part of a complete examination. Photoelectric Plethysmograph (PPG) waveforms and toe systolic pressure readings are included as required and additional duplex testing as needed. Limited examinations for reoccurring indications may be performed as noted.  ABI Findings: +---------+------------------+-----+----------+--------+ Right    Rt Pressure (mmHg)IndexWaveform  Comment  +---------+------------------+-----+----------+--------+ Brachial 148                                       +---------+------------------+-----+----------+--------+ CFA                             triphasic          +---------+------------------+-----+----------+--------+ Popliteal                       monophasic         +---------+------------------+-----+----------+--------+ PTA      254               1.66 monophasic         +---------+------------------+-----+----------+--------+ PERO     254               1.66 monophasic         +---------+------------------+-----+----------+--------+ DP       249               1.63 monophasic         +---------+------------------+-----+----------+--------+ Great Toe69                0.45 Abnormal           +---------+------------------+-----+----------+--------+ +---------+------------------+-----+---------+-------+ Left     Lt Pressure (mmHg)IndexWaveform Comment +---------+------------------+-----+---------+-------+ Brachial 153                                      +---------+------------------+-----+---------+-------+ CFA                             triphasic        +---------+------------------+-----+---------+-------+ Popliteal  triphasic        +---------+------------------+-----+---------+-------+ PTA      254               1.66 triphasic        +---------+------------------+-----+---------+-------+ PERO     254               1.72 triphasic        +---------+------------------+-----+---------+-------+ DP       245               1.60 triphasic        +---------+------------------+-----+---------+-------+ Great Toe103               0.67 Abnormal         +---------+------------------+-----+---------+-------+ +-------+-----------+-----------+------------+------------+ ABI/TBIToday's ABIToday's TBIPrevious ABIPrevious TBI +-------+-----------+-----------+------------+------------+ Right  [place]         0.45                                +-------+-----------+-----------+------------+------------+ Left   [place]         0.67                                +-------+-----------+-----------+------------+------------+ Arterial wall calcification precludes accurate ankle pressures and ABIs.  Summary: Right: Resting right ankle-brachial index indicates noncompressible right lower extremity arteries. The right toe-brachial index is abnormal. Left: Resting left ankle-brachial index indicates noncompressible left lower extremity arteries. The left toe-brachial index is abnormal.  *See table(s) above for measurements and observations. See arterial duplex report.  Vascular consult recommended. Electronically signed by Carlyle Dolly MD on 11/27/2021 at 9:12:00 AM.    Final     Assessment & Plan:   Problem List Items Addressed This Visit     Elevated LFTs    Much better Light beer only 1 can a day      PAD (peripheral artery disease) ([place])    11/2021 Doppler US  Summary:  Right: Total occlusion noted in the  distal superficial femoral artery.  Two-vessel runoff. Occlusion of the distal peroneal artery. Heavily  calcified vessel walls and athersclerosis throughout the right lower  extremity arteries.   Left: 50-74% stenosis noted in the distal superficial femoral artery.  Two-vessel runoff. Occlusion of the distal peroneal artery. Heavily  calcified vessel walls and atherosclerosis throughout the left lower  extremity arteries.   Vasc surg ref Pt will increase ASA to 81 mg bid Start Trental Pt declined Xarelto at the moment      Relevant Medications   aspirin EC 81 MG tablet   Other Relevant Orders   Ambulatory referral to Vascular Surgery   Prediabetes    Pt lost wt w/drinking less      Well adult exam - Primary     We discussed age appropriate health related issues, including available/recomended screening tests and vaccinations. Labs were ordered to be later reviewed . All questions were answered. We discussed one or more of the following - seat belt use, use of sunscreen/sun exposure exercise, fall risk reduction, second hand smoke exposure, firearm use and storage, seat belt use, a need for adhering to healthy diet and exercise. Labs were ordered.  All questions were answered.           Meds ordered this encounter  Medications   pentoxifylline (TRENTAL) 400 MG CR tablet    Sig: Take 1 tablet (400 mg  total) by mouth 3 (three) times daily with meals.    Dispense:  90 tablet    Refill:  11   clobetasol ointment (TEMOVATE) 0.05 %    Sig: Apply 1 Application topically 2 (two) times daily.    Dispense:  120 g    Refill:  3   aspirin EC 81 MG tablet    Sig: Take 1 tablet (81 mg total) by mouth in the morning and at bedtime.    Dispense:  100 tablet    Refill:  3      Follow-up: Return in about 3 months (around 10/03/2022) for a follow-up visit.  Walker Kehr, MD

## 2022-07-03 NOTE — Assessment & Plan Note (Signed)
Pt lost wt w/drinking less

## 2022-07-03 NOTE — Assessment & Plan Note (Signed)
Much better Light beer only 1 can a day

## 2022-07-03 NOTE — Assessment & Plan Note (Signed)

## 2022-07-03 NOTE — Assessment & Plan Note (Addendum)
11/2021 Doppler US  Summary:  Right: Total occlusion noted in the distal superficial femoral artery.  Two-vessel runoff. Occlusion of the distal peroneal artery. Heavily  calcified vessel walls and athersclerosis throughout the right lower  extremity arteries.   Left: 50-74% stenosis noted in the distal superficial femoral artery.  Two-vessel runoff. Occlusion of the distal peroneal artery. Heavily  calcified vessel walls and atherosclerosis throughout the left lower  extremity arteries.   Vasc surg ref Pt will increase ASA to 81 mg bid Start Trental Pt declined Xarelto at the moment

## 2022-07-12 ENCOUNTER — Other Ambulatory Visit: Payer: Self-pay | Admitting: Internal Medicine

## 2022-08-07 ENCOUNTER — Other Ambulatory Visit: Payer: Self-pay | Admitting: *Deleted

## 2022-08-07 DIAGNOSIS — M79604 Pain in right leg: Secondary | ICD-10-CM

## 2022-08-08 ENCOUNTER — Ambulatory Visit: Payer: Medicare HMO | Admitting: Vascular Surgery

## 2022-08-08 ENCOUNTER — Ambulatory Visit (HOSPITAL_COMMUNITY)
Admission: RE | Admit: 2022-08-08 | Discharge: 2022-08-08 | Disposition: A | Discharge status: Home / Self Care | Payer: Medicare HMO | Source: Ambulatory Visit | Attending: Vascular Surgery | Admitting: Vascular Surgery

## 2022-08-08 ENCOUNTER — Encounter: Payer: Self-pay | Admitting: Vascular Surgery

## 2022-08-08 VITALS — BP 152/78 | HR 71 | Temp 98.3°F | Resp 18 | Ht 71.0 in | Wt 262.6 lb

## 2022-08-08 DIAGNOSIS — M79604 Pain in right leg: Secondary | ICD-10-CM | POA: Diagnosis not present

## 2022-08-08 DIAGNOSIS — I70213 Atherosclerosis of native arteries of extremities with intermittent claudication, bilateral legs: Secondary | ICD-10-CM

## 2022-08-08 DIAGNOSIS — M79605 Pain in left leg: Secondary | ICD-10-CM | POA: Insufficient documentation

## 2022-08-08 NOTE — Progress Notes (Signed)
[facility] [place]  ASSESSMENT / PLAN: Zachary Valdez is a 71 y.o. male with atherosclerosis of native arteries of bilateral lower extremities causing intermittent claudication.  Patient counseled patients with asymptomatic peripheral arterial disease or claudication have a 1-2% risk of developing chronic limb threatening ischemia, but a 15-30% risk of mortality in the next 5 years. Intervention should only be considered for medically optimized patients with disabling symptoms.    Recommend the following which can slow the progression of atherosclerosis and reduce the risk of major adverse cardiac / limb events:  Complete cessation from all tobacco products. Blood glucose control with goal A1c < 7%. Blood pressure control with goal blood pressure < 140/90 mmHg. Lipid reduction therapy with goal LDL-C <100 mg/dL (<70 if symptomatic from PAD).  Aspirin '81mg'$  PO QD.  Atorvastatin 40-'80mg'$  PO QD (or other "high intensity" statin therapy). Daily walking to and past the point of discomfort. Patient counseled to keep a log of exercise distance.  Follow-up with me in 6 to 12 months with repeat ABI for surveillance.  Patient counseled about limb threatening symptoms.  He knows to return to care soon as possible should these develop.  CHIEF COMPLAINT: Second opinion about claudication  HISTORY OF PRESENT ILLNESS: Zachary Valdez is a 71 y.o. male to clinic by Dr. Alain Marion for evaluation of fairly classic symptoms of intermittent claudication.  Patient reports he can walk a moderate distance before symptoms will begin.  This does not interfere with his activities of independent living.  He does not describe ischemic rest pain symptoms.  He has no active ulcers about his feet.  He would like very much to try to avoid intervention if at all possible.  Past Medical History:  Diagnosis Date   Allergic rhinitis    Anxiety    Diabetes mellitus without complication ([place])    GERD  (gastroesophageal reflux disease)    Hyperlipidemia    Hyperplastic colon polyp 2006   Hypertension    OA (osteoarthritis)    knees,    Obesity    Sleep apnea    cpap-    Ulcerative esophagitis 2006   Dr. Fuller Plan   Vitamin B 12 deficiency     Past Surgical History:  Procedure Laterality Date   KNEE ARTHROSCOPY     Right   LEFT HEART CATH AND CORONARY ANGIOGRAPHY N/A 11/15/2021   Procedure: LEFT HEART CATH AND CORONARY ANGIOGRAPHY;  Surgeon: Leonie Man, MD;  Location: [facility];  Service: Cardiovascular;  Laterality: N/A;   TOTAL KNEE ARTHROPLASTY Left 09/07/2014   Procedure: LEFT TOTAL KNEE ARTHROPLASTY;  Surgeon: Gearlean Alf, MD;  Location: [facility];  Service: Orthopedics;  Laterality: Left;   TOTAL KNEE ARTHROPLASTY Right 03/14/2021   Procedure: TOTAL KNEE ARTHROPLASTY;  Surgeon: Gaynelle Arabian, MD;  Location: [facility];  Service: Orthopedics;  Laterality: Right;  72mn    Family History  Problem Relation Age of Onset   Breast cancer Mother    Hypertension Mother    Hypertension Other    Colon cancer Neg Hx     Social History   Socioeconomic History   Marital status: Divorced    Spouse name: Not on file   Number of children: Not on file   Years of education: Not on file   Highest education level: Not on file  Occupational History   Occupation: retired  Tobacco Use   Smoking status: Former    Packs/day: 1.50    Years: 15.00  Total pack years: 22.50    Types: Cigarettes    Quit date: 10/02/1997    Years since quitting: 24.8   Smokeless tobacco: Never  Vaping Use   Vaping Use: Never used  Substance and Sexual Activity   Alcohol use: Yes    Alcohol/week: 14.0 standard drinks of alcohol    Types: 14 Glasses of wine per week   Drug use: No   Sexual activity: Not Currently  Other Topics Concern   Not on file  Social History Narrative   Daily caffeine- 2-3 per day   Social Determinants of Health   Financial Resource Strain: Low Risk  (12/05/2021)    Overall Financial Resource Strain (CARDIA)    Difficulty of Paying Living Expenses: Not hard at all  Food Insecurity: No Food Insecurity (12/05/2021)   Hunger Vital Sign    Worried About Running Out of Food in the Last Year: Never true    Ran Out of Food in the Last Year: Never true  Transportation Needs: No Transportation Needs (12/05/2021)   PRAPARE - Hydrologist (Medical): No    Lack of Transportation (Non-Medical): No  Physical Activity: Sufficiently Active (12/05/2021)   Exercise Vital Sign    Days of Exercise per Week: 5 days    Minutes of Exercise per Session: 30 min  Stress: No Stress Concern Present (12/05/2021)   [place]    Feeling of Stress : Not at all  Social Connections: Socially Isolated (12/05/2021)   Social Connection and Isolation Panel [NHANES]    Frequency of Communication with Friends and Family: Twice a week    Frequency of Social Gatherings with Friends and Family: Twice a week    Attends Religious Services: Never    Marine scientist or Organizations: No    Attends Archivist Meetings: Never    Marital Status: Separated  Intimate Partner Violence: Not At Risk (12/05/2021)   Humiliation, Afraid, Rape, and Kick questionnaire    Fear of Current or Ex-Partner: No    Emotionally Abused: No    Physically Abused: No    Sexually Abused: No    Allergies  Allergen Reactions   Hydrocodone-Acetaminophen Shortness Of Breath    REACTION: SOB, patient states drug made patient feel weird    Atorvastatin     REACTION: achy   Candesartan Cough   Dilaudid [Hydromorphone]     hallucinations   Enalapril Cough   Losartan Cough   Nexium [Esomeprazole] Diarrhea   Tramadol     headache   Wellbutrin [Bupropion] Anxiety    Severe     Current Outpatient Medications  Medication Sig Dispense Refill   aspirin EC 81 MG tablet Take 1 tablet (81 mg total) by mouth in the  morning and at bedtime. 100 tablet 3   clobetasol ointment (TEMOVATE) 7.79 % Apply 1 Application topically 2 (two) times daily. 120 g 3   metFORMIN (GLUCOPHAGE-XR) 750 MG 24 hr tablet TAKE ONE TABLET BY MOUTH WITH BREAKFAST 90 tablet 3   olmesartan (BENICAR) 40 MG tablet TAKE ONE TABLET BY MOUTH DAILY 90 tablet 1   pantoprazole (PROTONIX) 40 MG tablet TAKE ONE TABLET BY MOUTH DAILY AS NEEDED (Patient taking differently: daily.) 90 tablet 3   pentoxifylline (TRENTAL) 400 MG CR tablet Take 1 tablet (400 mg total) by mouth 3 (three) times daily with meals. 90 tablet 11   rosuvastatin (CRESTOR) 20 MG tablet Take 1 tablet (20  mg total) by mouth daily. Take 20 mg by mouth daily. 90 tablet 1   vitamin B-12 (CYANOCOBALAMIN) 1000 MCG tablet Take 1 tablet (1,000 mcg total) by mouth daily. 100 tablet 3   isosorbide mononitrate (IMDUR) 30 MG 24 hr tablet Take 1 tablet (30 mg total) by mouth daily. (Patient not taking: Reported on 08/08/2022) 90 tablet 3   nitroGLYCERIN (NITROSTAT) 0.4 MG SL tablet Place 1 tablet (0.4 mg total) under the tongue every 5 (five) minutes as needed for chest pain. 90 tablet 3   No current facility-administered medications for this visit.    PHYSICAL EXAM Vitals:   08/08/22 1332  BP: (!) 152/78  Pulse: 71  Resp: 18  Temp: 98.3 F (36.8 C)  TempSrc: Temporal  SpO2: 97%  Weight: 262 lb 9.6 oz (119.1 kg)  Height: '5\' 11"'$  (1.803 m)   Well-appearing elderly gentleman in no acute distress Regular rate and rhythm Unlabored breathing No palpable pedal pulses No ulcers about the feet  PERTINENT LABORATORY AND RADIOLOGIC DATA  Most recent CBC    Latest Ref Rng & Units 06/29/2022    8:09 AM 11/11/2021    2:12 PM 11/03/2021    1:27 PM  CBC  WBC 4.0 - 10.5 K/uL 4.9  6.7  8.4   Hemoglobin 13.0 - 17.0 g/dL 15.3  15.5  15.0   Hematocrit 39.0 - 52.0 % 44.2  45.5  45.9   Platelets 150.0 - 400.0 K/uL 104.0  117  108      Most recent CMP    Latest Ref Rng & Units 06/29/2022     8:09 AM 11/11/2021    2:12 PM 11/03/2021    1:27 PM  CMP  Glucose 70 - 99 mg/dL 111  108  248   BUN 6 - 23 mg/dL '16  14  14   '$ Creatinine 0.40 - 1.50 mg/dL 0.95  0.92  0.82   Sodium 135 - 145 mEq/L 139  140  135   Potassium 3.5 - 5.1 mEq/L 4.1  4.1  4.2   Chloride 96 - 112 mEq/L 104  103  103   CO2 19 - 32 mEq/L '27  19  24   '$ Calcium 8.4 - 10.5 mg/dL 9.5  9.7  9.5   Total Protein 6.0 - 8.3 g/dL 6.9     Total Bilirubin 0.2 - 1.2 mg/dL 0.9     Alkaline Phos 39 - 117 U/L 60     AST 0 - 37 U/L 27     ALT 0 - 53 U/L 23       Renal function CrCl cannot be calculated (Patient's most recent lab result is older than the maximum 21 days allowed.).  Hgb A1c MFr Bld (%)  Date Value  06/29/2022 5.9    LDL Chol Calc (NIH)  Date Value Ref Range Status  09/23/2019 119 (H) 0 - 99 mg/dL Final   LDL Cholesterol  Date Value Ref Range Status  06/29/2022 92 0 - 99 mg/dL Final   Direct LDL  Date Value Ref Range Status  10/07/2010 146.2 mg/dL Final    Comment:    Optimal:  <100 mg/dLNear or Above Optimal:  100-129 mg/dLBorderline High:  130-159 mg/dLHigh:  160-189 mg/dLVery High:  >190 mg/dL      +-------+-----------+-----------+---------------+------------+  ABI/TBIToday's ABIToday's TBIPrevious ABI   Previous TBI  +-------+-----------+-----------+---------------+------------+  Right 0.68       0.48       Noncompressible0.45          +-------+-----------+-----------+---------------+------------+  Left  1.23       0.65       Noncompressible0.67          +-------+-----------+-----------+---------------+------------+    Yevonne Aline. Stanford Breed, MD [facility] Phone Number: [phone] 08/08/2022 5:03 PM   Total time spent on preparing this encounter including chart review, data review, collecting history, examining the patient, coordinating care for this new patient, 60 minutes.  Portions of this report may have been transcribed  using voice recognition software.  Every effort has been made to ensure accuracy; however, inadvertent computerized transcription errors may still be present.

## 2022-08-11 ENCOUNTER — Other Ambulatory Visit: Payer: Self-pay | Admitting: Internal Medicine

## 2022-08-14 ENCOUNTER — Other Ambulatory Visit: Payer: Self-pay | Admitting: Internal Medicine

## 2022-08-15 ENCOUNTER — Other Ambulatory Visit: Payer: Self-pay

## 2022-08-15 DIAGNOSIS — I70213 Atherosclerosis of native arteries of extremities with intermittent claudication, bilateral legs: Secondary | ICD-10-CM

## 2022-10-03 ENCOUNTER — Ambulatory Visit: Payer: Medicare HMO | Admitting: Internal Medicine

## 2022-11-07 ENCOUNTER — Ambulatory Visit: Payer: Medicare HMO | Admitting: Internal Medicine

## 2022-11-09 ENCOUNTER — Encounter: Payer: Self-pay | Admitting: Internal Medicine

## 2022-11-09 ENCOUNTER — Ambulatory Visit (INDEPENDENT_AMBULATORY_CARE_PROVIDER_SITE_OTHER): Payer: Medicare Other | Admitting: Internal Medicine

## 2022-11-09 VITALS — BP 140/82 | HR 85 | Temp 98.7°F | Ht 71.0 in | Wt 279.0 lb

## 2022-11-09 DIAGNOSIS — J0101 Acute recurrent maxillary sinusitis: Secondary | ICD-10-CM | POA: Diagnosis not present

## 2022-11-09 DIAGNOSIS — E669 Obesity, unspecified: Secondary | ICD-10-CM

## 2022-11-09 DIAGNOSIS — F419 Anxiety disorder, unspecified: Secondary | ICD-10-CM | POA: Diagnosis not present

## 2022-11-09 DIAGNOSIS — E1169 Type 2 diabetes mellitus with other specified complication: Secondary | ICD-10-CM | POA: Diagnosis not present

## 2022-11-09 DIAGNOSIS — E538 Deficiency of other specified B group vitamins: Secondary | ICD-10-CM

## 2022-11-09 MED ORDER — TIRZEPATIDE 2.5 MG/0.5ML ~~LOC~~ SOAJ: 2.5000 mg | SUBCUTANEOUS | 3 refills | Status: DC

## 2022-11-09 MED ORDER — CEFDINIR 300 MG PO CAPS: 300.0000 mg | ORAL_CAPSULE | Freq: Two times a day (BID) | ORAL | 0 refills | Status: DC

## 2022-11-09 MED ORDER — ALPRAZOLAM 0.5 MG PO TABS: 0.5000 mg | ORAL_TABLET | Freq: Two times a day (BID) | ORAL | 1 refills | Status: DC | PRN

## 2022-11-09 NOTE — Assessment & Plan Note (Signed)
Start Cefdinir

## 2022-11-09 NOTE — Assessment & Plan Note (Signed)
Start Mounjaro

## 2022-11-09 NOTE — Assessment & Plan Note (Signed)
Xanax prn - rare  Potential benefits of a long term benzodiazepines  use as well as potential risks  and complications were explained to the patient and were aknowledged. 

## 2022-11-09 NOTE — Progress Notes (Signed)
Subjective:  Patient ID: Zachary Valdez, male    DOB: 1951/05/10  Age: 72 y.o. MRN: [id number]  CC: No chief complaint on file.   HPI [name] presents for sinusitis - recurrent  Outpatient Medications Prior to Visit  Medication Sig Dispense Refill   aspirin EC 81 MG tablet Take 1 tablet (81 mg total) by mouth in the morning and at bedtime. 100 tablet 3   clobetasol ointment (TEMOVATE) 3.23 % Apply 1 Application topically 2 (two) times daily. 120 g 3   isosorbide mononitrate (IMDUR) 30 MG 24 hr tablet Take 1 tablet (30 mg total) by mouth daily. (Patient not taking: Reported on 08/08/2022) 90 tablet 3   metFORMIN (GLUCOPHAGE-XR) 750 MG 24 hr tablet TAKE ONE TABLET BY MOUTH WITH BREAKFAST 90 tablet 3   nitroGLYCERIN (NITROSTAT) 0.4 MG SL tablet Place 1 tablet (0.4 mg total) under the tongue every 5 (five) minutes as needed for chest pain. 90 tablet 3   olmesartan (BENICAR) 40 MG tablet TAKE ONE TABLET BY MOUTH DAILY 90 tablet 3   pantoprazole (PROTONIX) 40 MG tablet TAKE ONE TABLET BY MOUTH DAILY AS NEEDED 90 tablet 3   pentoxifylline (TRENTAL) 400 MG CR tablet Take 1 tablet (400 mg total) by mouth 3 (three) times daily with meals. 90 tablet 11   rosuvastatin (CRESTOR) 20 MG tablet Take 1 tablet (20 mg total) by mouth daily. Take 20 mg by mouth daily. 90 tablet 1   vitamin B-12 (CYANOCOBALAMIN) 1000 MCG tablet Take 1 tablet (1,000 mcg total) by mouth daily. 100 tablet 3   No facility-administered medications prior to visit.    ROS: Review of Systems  Constitutional:  Positive for unexpected weight change. Negative for appetite change and fatigue.  HENT:  Positive for congestion. Negative for nosebleeds, sneezing, sore throat and trouble swallowing.   Eyes:  Negative for itching and visual disturbance.  Respiratory:  Negative for cough.   Cardiovascular:  Negative for chest pain, palpitations and leg swelling.  Gastrointestinal:  Negative for abdominal distention, blood in stool,  diarrhea and nausea.  Genitourinary:  Negative for frequency and hematuria.  Musculoskeletal:  Positive for arthralgias and back pain. Negative for gait problem, joint swelling and neck pain.  Skin:  Positive for color change. Negative for rash.  Neurological:  Negative for dizziness, tremors, speech difficulty and weakness.  Hematological:  Bruises/bleeds easily.  Psychiatric/Behavioral:  Negative for agitation, dysphoric mood, sleep disturbance and suicidal ideas. The patient is not nervous/anxious.     Objective:  BP (!) 140/82 (BP Location: Right Arm, Patient Position: Sitting, Cuff Size: Normal)   Pulse 85   Temp 98.7 F (37.1 C) (Oral)   Ht '5\' 11"'$  (1.803 m)   Wt 279 lb (126.6 kg)   SpO2 95%   BMI 38.91 kg/m   BP Readings from Last 3 Encounters:  11/09/22 (!) 140/82  08/08/22 (!) 152/78  07/03/22 (!) 142/70    Wt Readings from Last 3 Encounters:  11/09/22 279 lb (126.6 kg)  08/08/22 262 lb 9.6 oz (119.1 kg)  07/03/22 266 lb 3.2 oz (120.7 kg)    Physical Exam Constitutional:      General: He is not in acute distress.    Appearance: He is well-developed. He is obese.     Comments: NAD  Eyes:     Conjunctiva/sclera: Conjunctivae normal.     Pupils: Pupils are equal, round, and reactive to light.  Neck:     Thyroid: No thyromegaly.  Vascular: No JVD.  Cardiovascular:     Rate and Rhythm: Normal rate and regular rhythm.     Heart sounds: Normal heart sounds. No murmur heard.    No friction rub. No gallop.  Pulmonary:     Effort: Pulmonary effort is normal. No respiratory distress.     Breath sounds: Normal breath sounds. No wheezing or rales.  Chest:     Chest wall: No tenderness.  Abdominal:     General: Bowel sounds are normal. There is no distension.     Palpations: Abdomen is soft. There is no mass.     Tenderness: There is no abdominal tenderness. There is no guarding or rebound.  Musculoskeletal:        General: No tenderness. Normal range of motion.      Cervical back: Normal range of motion.     Right lower leg: No edema.     Left lower leg: Edema present.  Lymphadenopathy:     Cervical: No cervical adenopathy.  Skin:    General: Skin is warm and dry.     Findings: No rash.  Neurological:     Mental Status: He is alert and oriented to person, place, and time.     Cranial Nerves: No cranial nerve deficit.     Motor: No abnormal muscle tone.     Coordination: Coordination normal.     Gait: Gait normal.     Deep Tendon Reflexes: Reflexes are normal and symmetric.  Psychiatric:        Behavior: Behavior normal.        Thought Content: Thought content normal.        Judgment: Judgment normal.     Lab Results  Component Value Date   WBC 4.9 06/29/2022   HGB 15.3 06/29/2022   HCT 44.2 06/29/2022   PLT 104.0 (L) 06/29/2022   GLUCOSE 111 (H) 06/29/2022   CHOL 162 06/29/2022   TRIG 106.0 06/29/2022   HDL 47.90 06/29/2022   LDLDIRECT 146.2 10/07/2010   LDLCALC 92 06/29/2022   ALT 23 06/29/2022   AST 27 06/29/2022   NA 139 06/29/2022   K 4.1 06/29/2022   CL 104 06/29/2022   CREATININE 0.95 06/29/2022   BUN 16 06/29/2022   CO2 27 06/29/2022   TSH 3.51 06/29/2022   PSA 0.74 06/29/2022   INR 1.1 03/04/2021   HGBA1C 5.9 06/29/2022    VAS [place] ABI WITH/WO TBI  Result Date: 08/09/2022  LOWER EXTREMITY DOPPLER STUDY Patient Name:  Zachary Valdez  Date of Exam:   08/08/2022 Medical Rec #: [id number]      Accession #:    [id number] Date of Birth: 03/29/1951       Patient Gender: M Patient Age:   25 years Exam Location:  [facility] Procedure:      VAS [place] ABI WITH/WO TBI Referring Phys: Jamelle Haring --------------------------------------------------------------------------------  Indications: Peripheral artery disease. High Risk Factors: Hypertension, hyperlipidemia, Diabetes.  Comparison Study: 11/25/21 ABI/TBI- right=noncompressible/0.45, left=                   noncompressible/0.67 Performing Technologist: Maudry Mayhew MHA, RVT, RDCS, RDMS  Examination Guidelines: A complete evaluation includes at minimum, Doppler waveform signals and systolic blood pressure reading at the level of bilateral brachial, anterior tibial, and posterior tibial arteries, when vessel segments are accessible. Bilateral testing is considered an integral part of a complete examination. Photoelectric Plethysmograph (PPG) waveforms and toe systolic pressure readings are included as required  and additional duplex testing as needed. Limited examinations for reoccurring indications may be performed as noted.  ABI Findings: +---------+------------------+-----+----------+--------+ Right    Rt Pressure (mmHg)IndexWaveform  Comment  +---------+------------------+-----+----------+--------+ Brachial 155                                       +---------+------------------+-----+----------+--------+ PTA      Noncompressible        monophasic         +---------+------------------+-----+----------+--------+ DP       106               0.68 monophasic         +---------+------------------+-----+----------+--------+ Great Toe75                0.48                    +---------+------------------+-----+----------+--------+ +---------+------------------+-----+---------+-------+ Left     Lt Pressure (mmHg)IndexWaveform Comment +---------+------------------+-----+---------+-------+ Brachial 128                                     +---------+------------------+-----+---------+-------+ PTA      190               1.23 triphasic        +---------+------------------+-----+---------+-------+ DP       121               0.78 triphasic        +---------+------------------+-----+---------+-------+ Great Toe101               0.65                  +---------+------------------+-----+---------+-------+ +-------+-----------+-----------+---------------+------------+ ABI/TBIToday's ABIToday's TBIPrevious ABI   Previous  TBI +-------+-----------+-----------+---------------+------------+ Right  0.68       0.48       Noncompressible0.45         +-------+-----------+-----------+---------------+------------+ Left   1.23       0.65       Noncompressible0.67         +-------+-----------+-----------+---------------+------------+  Patient notes recent resolution of bilateral lower extremity claudication within the past several weeks, after losing weight and starting an exercise regimen.  Summary: Right: Resting right ankle-brachial index indicates moderate right lower extremity arterial disease. The right toe-brachial index is abnormal. Left: Resting left ankle-brachial index is within normal range. The left toe-brachial index is abnormal. *See table(s) above for measurements and observations.  Electronically signed by Jamelle Haring on 08/09/2022 at 3:59:12 PM.    Final     Assessment & Plan:   Problem List Items Addressed This Visit       Respiratory   Sinusitis, acute    Start Cefdinir      Relevant Medications   cefdinir (OMNICEF) 300 MG capsule     Endocrine   Diabetes mellitus type 2 in obese (HCC) - Primary    Start Mounjaro      Relevant Medications   tirzepatide (MOUNJARO) 2.5 MG/0.5ML Pen   Other Relevant Orders   Comprehensive metabolic panel   Hemoglobin A1c     Other   B12 deficiency    On B12      Anxiety disorder    Xanax prn - rare  Potential benefits of a long term benzodiazepines  use as well as potential risks  and complications were  explained to the patient and were aknowledged.      Relevant Medications   ALPRAZolam (XANAX) 0.5 MG tablet      Meds ordered this encounter  Medications   tirzepatide (MOUNJARO) 2.5 MG/0.5ML Pen    Sig: Inject 2.5 mg into the skin once a week.    Dispense:  2 mL    Refill:  3   ALPRAZolam (XANAX) 0.5 MG tablet    Sig: Take 1 tablet (0.5 mg total) by mouth 2 (two) times daily as needed for anxiety.    Dispense:  60 tablet     Refill:  1   cefdinir (OMNICEF) 300 MG capsule    Sig: Take 1 capsule (300 mg total) by mouth 2 (two) times daily.    Dispense:  20 capsule    Refill:  0      Follow-up: Return in about 3 months (around 02/07/2023) for a follow-up visit.  Walker Kehr, MD

## 2022-11-09 NOTE — Assessment & Plan Note (Signed)
On B12 

## 2022-11-20 ENCOUNTER — Telehealth: Payer: Self-pay

## 2022-11-20 NOTE — Telephone Encounter (Signed)
Called patient to schedule Medicare Annual Wellness Visit (AWV). Left message for patient to call back and schedule Medicare Annual Wellness Visit (AWV).  Last date of AWV: 12/05/21  Please schedule an appointment at any time with [facility].  If any questions, please contact me at [phone].  Thank you ,  P.J. Quentin Cornwall, CMA (AAMA)

## 2022-11-24 ENCOUNTER — Other Ambulatory Visit: Payer: Self-pay | Admitting: Internal Medicine

## 2022-12-07 ENCOUNTER — Ambulatory Visit (INDEPENDENT_AMBULATORY_CARE_PROVIDER_SITE_OTHER): Payer: Medicare Other

## 2022-12-07 VITALS — BP 120/78 | HR 85 | Temp 98.8°F | Ht 71.0 in | Wt 265.6 lb

## 2022-12-07 DIAGNOSIS — Z0000 Encounter for general adult medical examination without abnormal findings: Secondary | ICD-10-CM

## 2022-12-07 NOTE — Progress Notes (Signed)
Subjective:   Zachary Valdez is a 72 y.o. male who presents for Medicare Annual/Subsequent preventive examination.  Review of Systems     Cardiac Risk Factors include: advanced age (>64mn, >>1women);male gender;dyslipidemia;family history of premature cardiovascular disease;diabetes mellitus;hypertension;obesity (BMI >30kg/m2)     Objective:    Today's Vitals   12/07/22 0911  BP: 120/78  Pulse: 85  Temp: 98.8 F (37.1 C)  TempSrc: Temporal  Weight: 265 lb 9.6 oz (120.5 kg)  Height: '5\' 11"'$  (1.803 m)  PainSc: 0-No pain   Body mass index is 37.04 kg/m.     12/07/2022    9:37 AM 12/05/2021    1:08 PM 11/15/2021    9:35 AM 11/03/2021    1:27 PM 03/14/2021    7:35 PM 03/04/2021    1:49 PM 12/02/2020    2:15 PM  Advanced Directives  Does Patient Have a Medical Advance Directive? Yes Yes Yes No Yes Yes Yes  Type of AParamedicof AG[place]Living will [place]Living will [place]Living will  [place]arn[place] CityLiving will [place]Living will   Does patient want to make changes to medical advance directive?     No - Patient declined  No - Patient declined  Copy of HCenterfieldin Chart? No - copy requested No - copy requested   No - copy requested      Current Medications (verified) Outpatient Encounter Medications as of 12/07/2022  Medication Sig   ALPRAZolam (XANAX) 0.5 MG tablet Take 1 tablet (0.5 mg total) by mouth 2 (two) times daily as needed for anxiety.   aspirin EC 81 MG tablet Take 1 tablet (81 mg total) by mouth in the morning and at bedtime.   cefdinir (OMNICEF) 300 MG capsule Take 1 capsule (300 mg total) by mouth 2 (two) times daily.   clobetasol ointment (TEMOVATE) 0AB-123456789% Apply 1 Application topically 2 (two) times daily.   isosorbide mononitrate (IMDUR) 30 MG 24 hr tablet Take 1 tablet (30 mg total) by mouth daily. (Patient not taking: Reported on 08/08/2022)    metFORMIN (GLUCOPHAGE-XR) 750 MG 24 hr tablet TAKE ONE TABLET BY MOUTH WITH BREAKFAST   nitroGLYCERIN (NITROSTAT) 0.4 MG SL tablet Place 1 tablet (0.4 mg total) under the tongue every 5 (five) minutes as needed for chest pain.   olmesartan (BENICAR) 40 MG tablet TAKE ONE TABLET BY MOUTH DAILY   pantoprazole (PROTONIX) 40 MG tablet TAKE ONE TABLET BY MOUTH DAILY AS NEEDED   pentoxifylline (TRENTAL) 400 MG CR tablet Take 1 tablet (400 mg total) by mouth 3 (three) times daily with meals.   rosuvastatin (CRESTOR) 20 MG tablet TAKE 1 TABLET BY MOUTH DAILY   tirzepatide (MOUNJARO) 2.5 MG/0.5ML Pen Inject 2.5 mg into the skin once a week.   vitamin B-12 (CYANOCOBALAMIN) 1000 MCG tablet Take 1 tablet (1,000 mcg total) by mouth daily.   No facility-administered encounter medications on file as of 12/07/2022.    Allergies (verified) Hydrocodone-acetaminophen, Atorvastatin, Candesartan, Dilaudid [hydromorphone], Enalapril, Losartan, Nexium [esomeprazole], Tramadol, and Wellbutrin [bupropion]   History: Past Medical History:  Diagnosis Date   Allergic rhinitis    Anxiety    Diabetes mellitus without complication ([place]    GERD (gastroesophageal reflux disease)    Hyperlipidemia    Hyperplastic colon polyp 2006   Hypertension    OA (osteoarthritis)    knees,    Obesity    Sleep apnea    cpap-  Ulcerative esophagitis 2006   Dr. Fuller Plan   Vitamin B 12 deficiency    Past Surgical History:  Procedure Laterality Date   KNEE ARTHROSCOPY     Right   LEFT HEART CATH AND CORONARY ANGIOGRAPHY N/A 11/15/2021   Procedure: LEFT HEART CATH AND CORONARY ANGIOGRAPHY;  Surgeon: Leonie Man, MD;  Location: [facility];  Service: Cardiovascular;  Laterality: N/A;   TOTAL KNEE ARTHROPLASTY Left 09/07/2014   Procedure: LEFT TOTAL KNEE ARTHROPLASTY;  Surgeon: Gearlean Alf, MD;  Location: [facility];  Service: Orthopedics;  Laterality: Left;   TOTAL KNEE ARTHROPLASTY Right 03/14/2021   Procedure: TOTAL  KNEE ARTHROPLASTY;  Surgeon: Gaynelle Arabian, MD;  Location: [facility];  Service: Orthopedics;  Laterality: Right;  33mn   Family History  Problem Relation Age of Onset   Breast cancer Mother    Hypertension Mother    Hypertension Other    Colon cancer Neg Hx    Social History   Socioeconomic History   Marital status: Divorced    Spouse name: Not on file   Number of children: Not on file   Years of education: Not on file   Highest education level: Not on file  Occupational History   Occupation: retired  Tobacco Use   Smoking status: Former    Packs/day: 1.50    Years: 15.00    Total pack years: 22.50    Types: Cigarettes    Quit date: 10/02/1997    Years since quitting: 25.1   Smokeless tobacco: Never  Vaping Use   Vaping Use: Never used  Substance and Sexual Activity   Alcohol use: Yes    Alcohol/week: 14.0 standard drinks of alcohol    Types: 14 Glasses of wine per week   Drug use: No   Sexual activity: Not Currently  Other Topics Concern   Not on file  Social History Narrative   Daily caffeine- 2-3 per day   Social Determinants of Health   Financial Resource Strain: Low Risk  (12/07/2022)   Overall Financial Resource Strain (CARDIA)    Difficulty of Paying Living Expenses: Not hard at all  Food Insecurity: No Food Insecurity (12/07/2022)   Hunger Vital Sign    Worried About Running Out of Food in the Last Year: Never true    Ran Out of Food in the Last Year: Never true  Transportation Needs: No Transportation Needs (12/07/2022)   PRAPARE - THydrologist(Medical): No    Lack of Transportation (Non-Medical): No  Physical Activity: Sufficiently Active (12/07/2022)   Exercise Vital Sign    Days of Exercise per Week: 5 days    Minutes of Exercise per Session: 30 min  Stress: No Stress Concern Present (12/07/2022)   [place]   Feeling of Stress : Not at all  Social Connections:  Socially Isolated (12/07/2022)   Social Connection and Isolation Panel [NHANES]    Frequency of Communication with Friends and Family: Twice a week    Frequency of Social Gatherings with Friends and Family: Twice a week    Attends Religious Services: Never    AMarine scientistor Organizations: No    Attends CMusic therapist Never    Marital Status: Divorced    Tobacco Counseling Counseling given: Not Answered   Clinical Intake:  Pre-visit preparation completed: Yes  Pain : No/denies pain Pain Score: 0-No pain     BMI -  recorded: 37.04 Nutritional Status: BMI > 30  Obese Nutritional Risks: None Diabetes: No  How often do you need to have someone help you when you read instructions, pamphlets, or other written materials from your doctor or pharmacy?: 1 - Never What is the last grade level you completed in school?: HSG; some college  Nutrition Risk Assessment:  Has the patient had any N/V/D within the last 2 months?  No  Does the patient have any non-healing wounds?  No  Has the patient had any unintentional weight loss or weight gain?  No   Diabetes:  Is the patient diabetic?  Yes  If diabetic, was a CBG obtained today?  No  Did the patient bring in their glucometer from home?  No  How often do you monitor your CBG's? No.   Financial Strains and Diabetes Management:  Are you having any financial strains with the device, your supplies or your medication? No .  Does the patient want to be seen by Chronic Care Management for management of their diabetes?  No  Would the patient like to be referred to a Nutritionist or for Diabetic Management?  No   Diabetic Exams:  Diabetic Eye Exam: Completed 03/13/2022 Diabetic Foot Exam: Overdue, Pt has been advised about the importance in completing this exam. Pt is scheduled for diabetic foot exam on 02/07/2023.   Interpreter Needed?: No  Information entered by :: Lisette Abu, LPN.   Activities of Daily  Living    12/07/2022    9:13 AM  In your present state of health, do you have any difficulty performing the following activities:  Hearing? 0  Vision? 0  Difficulty concentrating or making decisions? 0  Walking or climbing stairs? 0  Dressing or bathing? 0  Doing errands, shopping? 0  Preparing Food and eating ? N  Using the Toilet? N  In the past six months, have you accidently leaked urine? N  Do you have problems with loss of bowel control? N  Managing your Medications? N  Managing your Finances? N  Housekeeping or managing your Housekeeping? N    Patient Care Team: Plotnikov, Evie Lacks, MD as PCP - General Berniece Salines, DO as PCP - Cardiology (Cardiology) Clent Jacks, MD as Consulting Physician (Ophthalmology) Gaynelle Arabian, MD as Consulting Physician (Orthopedic Surgery) Lorretta Harp, MD as Consulting Physician (Cardiology)  Indicate any recent Medical Services you may have received from other than [facility] providers in the past year (date may be approximate).     Assessment:   This is a routine wellness examination for Rohit.  Hearing/Vision screen Hearing Screening - Comments:: Denies hearing difficulties   Vision Screening - Comments:: Wears rx glasses - up to date with routine eye exams with [facility]   Dietary issues and exercise activities discussed: Current Exercise Habits: Home exercise routine, Type of exercise: walking;treadmill;stretching;strength training/weights, Time (Minutes): 30, Frequency (Times/Week): 5, Weekly Exercise (Minutes/Week): 150, Intensity: Moderate, Exercise limited by: orthopedic condition(s)   Goals Addressed             This Visit's Progress    My goal for 2024 is to lose weight.        Depression Screen    12/07/2022    9:12 AM 11/09/2022    2:38 PM 07/03/2022   11:37 AM 12/05/2021    1:09 PM 12/05/2021    1:07 PM 11/29/2021    2:00 PM 12/02/2020    2:32 PM  PHQ 2/9 Scores  PHQ - 2 Score 0 0  0 0 0 0 0  PHQ- 9 Score 0  0         Fall Risk    12/07/2022    9:13 AM 11/09/2022    2:38 PM 07/03/2022   11:37 AM 12/05/2021    1:09 PM 11/29/2021    2:00 PM  Comunas in the past year? 0 0 0 0 0  Number falls in past yr: 0 0 0 0 0  Injury with Fall? 0 0 0 0 0  Risk for fall due to : No Fall Risks No Fall Risks No Fall Risks No Fall Risks   Follow up Falls prevention discussed Falls evaluation completed  Falls evaluation completed     [place]:  Any stairs in or around the home? No  If so, are there any without handrails? No  Home free of loose throw rugs in walkways, pet beds, electrical cords, etc? Yes  Adequate lighting in your home to reduce risk of falls? Yes   ASSISTIVE DEVICES UTILIZED TO PREVENT FALLS:  Life alert? No  Use of a cane, walker or w/c? No  Grab bars in the bathroom? No  Shower chair or bench in shower? No  Elevated toilet seat or a handicapped toilet? Yes   TIMED UP AND GO:  Was the test performed? Yes .  Length of time to ambulate 10 feet: 8 sec.   Gait steady and fast without use of assistive device  Cognitive Function:        12/07/2022    9:14 AM  6CIT Screen  What Year? 0 points  What month? 0 points  What time? 0 points  Count back from 20 0 points  Months in reverse 0 points  Repeat phrase 0 points  Total Score 0 points    Immunizations Immunization History  Administered Date(s) Administered   Fluad Quad(high Dose 65+) 07/28/2020, 07/20/2021   Influenza Whole 10/03/2005, 07/03/2011   Influenza,inj,Quad PF,6+ Mos 07/12/2019   Influenza,inj,quad, With Preservative 07/09/2019   Influenza-Unspecified 07/10/2016, 07/19/2017, 07/04/2018   PFIZER(Purple Top)SARS-COV-2 Vaccination 11/10/2019, 12/04/2019   Td 04/30/1998, 10/02/2006   Tdap 07/06/2013   Unspecified SARS-COV-2 Vaccination 11/10/2019    TDAP status: Up to date  Flu Vaccine status: Declined, Education has been provided regarding the importance of this vaccine  but patient still declined. Advised may receive this vaccine at local pharmacy or Health Dept. Aware to provide a copy of the vaccination record if obtained from local pharmacy or Health Dept. Verbalized acceptance and understanding.  Pneumococcal vaccine status: Declined,  Education has been provided regarding the importance of this vaccine but patient still declined. Advised may receive this vaccine at local pharmacy or Health Dept. Aware to provide a copy of the vaccination record if obtained from local pharmacy or Health Dept. Verbalized acceptance and understanding.   Covid-19 vaccine status: Declined, Education has been provided regarding the importance of this vaccine but patient still declined. Advised may receive this vaccine at local pharmacy or Health Dept.or vaccine clinic. Aware to provide a copy of the vaccination record if obtained from local pharmacy or Health Dept. Verbalized acceptance and understanding.  Qualifies for Shingles Vaccine? Yes   Zostavax completed No   Shingrix Completed?: No.    Education has been provided regarding the importance of this vaccine. Patient has been advised to call insurance company to determine out of pocket expense if they have not yet received this vaccine. Advised may also receive vaccine  at local pharmacy or Health Dept. Verbalized acceptance and understanding.  Screening Tests Health Maintenance  Topic Date Due   FOOT EXAM  Never done   OPHTHALMOLOGY EXAM  Never done   Diabetic kidney evaluation - Urine ACR  Never done   Pneumonia Vaccine 69+ Years old (1 of 1 - PCV) Never done   COLONOSCOPY (Pts 45-52yr Insurance coverage will need to be confirmed)  06/07/2021   COVID-19 Vaccine (4 - 2023-24 season) 06/02/2022   INFLUENZA VACCINE  12/31/2022 (Originally 05/02/2022)   HEMOGLOBIN A1C  12/28/2022   Diabetic kidney evaluation - eGFR measurement  06/30/2023   DTaP/Tdap/Td (4 - Td or Tdap) 07/07/2023   Medicare Annual Wellness (AWV)  12/07/2023    Hepatitis C Screening  Completed   HPV VACCINES  Aged Out   Zoster Vaccines- Shingrix  Discontinued    Health Maintenance  Health Maintenance Due  Topic Date Due   FOOT EXAM  Never done   OPHTHALMOLOGY EXAM  Never done   Diabetic kidney evaluation - Urine ACR  Never done   Pneumonia Vaccine 70 Years old (1 of 1 - PCV) Never done   COLONOSCOPY (Pts 45-433yrInsurance coverage will need to be confirmed)  06/07/2021   COVID-19 Vaccine (4 - 2023-24 season) 06/02/2022    Colorectal cancer screening: Type of screening: Colonoscopy. Completed 06/07/2016. Repeat every 5 years-patient declined referral  Lung Cancer Screening: (Low Dose CT Chest recommended if Age 422-80ears, 30 pack-year currently smoking OR have quit w/in 15years.) does not qualify.   Lung Cancer Screening Referral: no  Additional Screening:  Hepatitis C Screening: does qualify; Completed 03/10/2016  Vision Screening: Recommended annual ophthalmology exams for early detection of glaucoma and other disorders of the eye. Is the patient up to date with their annual eye exam?  Yes  Who is the provider or what is the name of the office in which the patient attends annual eye exams? [facility] If pt is not established with a provider, would they like to be referred to a provider to establish care? No .   Dental Screening: Recommended annual dental exams for proper oral hygiene  Community Resource Referral / Chronic Care Management: CRR required this visit?  No   CCM required this visit?  No      Plan:     I have personally reviewed and noted the following in the patient's chart:   Medical and social history Use of alcohol, tobacco or illicit drugs  Current medications and supplements including opioid prescriptions. Patient is not currently taking opioid prescriptions. Functional ability and status Nutritional status Physical activity Advanced directives List of other physicians Hospitalizations, surgeries,  and ER visits in previous 12 months Vitals Screenings to include cognitive, depression, and falls Referrals and appointments  In addition, I have reviewed and discussed with patient certain preventive protocols, quality metrics, and best practice recommendations. A written personalized care plan for preventive services as well as general preventive health recommendations were provided to patient.     ShSheral FlowLPN   [id number] Nurse Notes:  Normal cognitive status assessed by direct observation by this Nurse Health Advisor. No abnormalities found.

## 2022-12-07 NOTE — Patient Instructions (Addendum)
[name] , Thank you for taking time to come for your Medicare Wellness Visit. I appreciate your ongoing commitment to your health goals. Please review the following plan we discussed and let me know if I can assist you in the future.   These are the goals we discussed:  Goals      My goal for 2024 is to lose weight.        This is a list of the screening recommended for you and due dates:  Health Maintenance  Topic Date Due   Complete foot exam   Never done   Eye exam for diabetics  Never done   Yearly kidney health urinalysis for diabetes  Never done   Pneumonia Vaccine (1 of 1 - PCV) Never done   Colon Cancer Screening  06/07/2021   COVID-19 Vaccine (4 - 2023-24 season) 06/02/2022   Flu Shot  12/31/2022*   Hemoglobin A1C  12/28/2022   Yearly kidney function blood test for diabetes  06/30/2023   DTaP/Tdap/Td vaccine (4 - Td or Tdap) 07/07/2023   Medicare Annual Wellness Visit  12/07/2023   Hepatitis C Screening: USPSTF Recommendation to screen - Ages 18-79 yo.  Completed   HPV Vaccine  Aged Out   Zoster (Shingles) Vaccine  Discontinued  *Topic was postponed. The date shown is not the original due date.    Advanced directives: Yes  Conditions/risks identified: Yes  Next appointment: Follow up in one year for your annual wellness visit.   Preventive Care 65 Years and Older, Male  Preventive care refers to lifestyle choices and visits with your health care provider that can promote health and wellness. What does preventive care include? A yearly physical exam. This is also called an annual well check. Dental exams once or twice a year. Routine eye exams. Ask your health care provider how often you should have your eyes checked. Personal lifestyle choices, including: Daily care of your teeth and gums. Regular physical activity. Eating a healthy diet. Avoiding tobacco and drug use. Limiting alcohol use. Practicing safe sex. Taking low doses of aspirin every  day. Taking vitamin and mineral supplements as recommended by your health care provider. What happens during an annual well check? The services and screenings done by your health care provider during your annual well check will depend on your age, overall health, lifestyle risk factors, and family history of disease. Counseling  Your health care provider may ask you questions about your: Alcohol use. Tobacco use. Drug use. Emotional well-being. Home and relationship well-being. Sexual activity. Eating habits. History of falls. Memory and ability to understand (cognition). Work and work Statistician. Screening  You may have the following tests or measurements: Height, weight, and BMI. Blood pressure. Lipid and cholesterol levels. These may be checked every 5 years, or more frequently if you are over 86 years old. Skin check. Lung cancer screening. You may have this screening every year starting at age 70 if you have a 30-pack-year history of smoking and currently smoke or have quit within the past 15 years. Fecal occult blood test (FOBT) of the stool. You may have this test every year starting at age 70. Flexible sigmoidoscopy or colonoscopy. You may have a sigmoidoscopy every 5 years or a colonoscopy every 10 years starting at age 54. Prostate cancer screening. Recommendations will vary depending on your family history and other risks. Hepatitis C blood test. Hepatitis B blood test. Sexually transmitted disease (STD) testing. Diabetes screening. This is done by checking your blood  sugar (glucose) after you have not eaten for a while (fasting). You may have this done every 1-3 years. Abdominal aortic aneurysm (AAA) screening. You may need this if you are a current or former smoker. Osteoporosis. You may be screened starting at age 70 if you are at high risk. Talk with your health care provider about your test results, treatment options, and if necessary, the need for more  tests. Vaccines  Your health care provider may recommend certain vaccines, such as: Influenza vaccine. This is recommended every year. Tetanus, diphtheria, and acellular pertussis (Tdap, Td) vaccine. You may need a Td booster every 10 years. Zoster vaccine. You may need this after age 70. Pneumococcal 13-valent conjugate (PCV13) vaccine. One dose is recommended after age 70. Pneumococcal polysaccharide (PPSV23) vaccine. One dose is recommended after age 70. Talk to your health care provider about which screenings and vaccines you need and how often you need them. This information is not intended to replace advice given to you by your health care provider. Make sure you discuss any questions you have with your health care provider. Document Released: 10/15/2015 Document Revised: 06/07/2016 Document Reviewed: 07/20/2015 Elsevier Interactive Patient Education  2017 [place] Prevention in the Home Falls can cause injuries. They can happen to people of all ages. There are many things you can do to make your home safe and to help prevent falls. What can I do on the outside of my home? Regularly fix the edges of walkways and driveways and fix any cracks. Remove anything that might make you trip as you walk through a door, such as a raised step or threshold. Trim any bushes or trees on the path to your home. Use bright outdoor lighting. Clear any walking paths of anything that might make someone trip, such as rocks or tools. Regularly check to see if handrails are loose or broken. Make sure that both sides of any steps have handrails. Any raised decks and porches should have guardrails on the edges. Have any leaves, snow, or ice cleared regularly. Use sand or salt on walking paths during winter. Clean up any spills in your garage right away. This includes oil or grease spills. What can I do in the bathroom? Use night lights. Install grab bars by the toilet and in the tub and shower.  Do not use towel bars as grab bars. Use non-skid mats or decals in the tub or shower. If you need to sit down in the shower, use a plastic, non-slip stool. Keep the floor dry. Clean up any water that spills on the floor as soon as it happens. Remove soap buildup in the tub or shower regularly. Attach bath mats securely with double-sided non-slip rug tape. Do not have throw rugs and other things on the floor that can make you trip. What can I do in the bedroom? Use night lights. Make sure that you have a light by your bed that is easy to reach. Do not use any sheets or blankets that are too big for your bed. They should not hang down onto the floor. Have a firm chair that has side arms. You can use this for support while you get dressed. Do not have throw rugs and other things on the floor that can make you trip. What can I do in the kitchen? Clean up any spills right away. Avoid walking on wet floors. Keep items that you use a lot in easy-to-reach places. If you need to reach something above you,  use a strong step stool that has a grab bar. Keep electrical cords out of the way. Do not use floor polish or wax that makes floors slippery. If you must use wax, use non-skid floor wax. Do not have throw rugs and other things on the floor that can make you trip. What can I do with my stairs? Do not leave any items on the stairs. Make sure that there are handrails on both sides of the stairs and use them. Fix handrails that are broken or loose. Make sure that handrails are as long as the stairways. Check any carpeting to make sure that it is firmly attached to the stairs. Fix any carpet that is loose or worn. Avoid having throw rugs at the top or bottom of the stairs. If you do have throw rugs, attach them to the floor with carpet tape. Make sure that you have a light switch at the top of the stairs and the bottom of the stairs. If you do not have them, ask someone to add them for you. What else  can I do to help prevent falls? Wear shoes that: Do not have high heels. Have rubber bottoms. Are comfortable and fit you well. Are closed at the toe. Do not wear sandals. If you use a stepladder: Make sure that it is fully opened. Do not climb a closed stepladder. Make sure that both sides of the stepladder are locked into place. Ask someone to hold it for you, if possible. Clearly mark and make sure that you can see: Any grab bars or handrails. First and last steps. Where the edge of each step is. Use tools that help you move around (mobility aids) if they are needed. These include: Canes. Walkers. Scooters. Crutches. Turn on the lights when you go into a dark area. Replace any light bulbs as soon as they burn out. Set up your furniture so you have a clear path. Avoid moving your furniture around. If any of your floors are uneven, fix them. If there are any pets around you, be aware of where they are. Review your medicines with your doctor. Some medicines can make you feel dizzy. This can increase your chance of falling. Ask your doctor what other things that you can do to help prevent falls. This information is not intended to replace advice given to you by your health care provider. Make sure you discuss any questions you have with your health care provider. Document Released: 07/15/2009 Document Revised: 02/24/2016 Document Reviewed: 10/23/2014 Elsevier Interactive Patient Education  2017 [employer].

## 2022-12-13 ENCOUNTER — Encounter: Payer: Self-pay | Admitting: Internal Medicine

## 2022-12-20 ENCOUNTER — Other Ambulatory Visit: Payer: Self-pay | Admitting: Internal Medicine

## 2022-12-20 MED ORDER — TIRZEPATIDE 5 MG/0.5ML ~~LOC~~ SOAJ: 5.0000 mg | SUBCUTANEOUS | 3 refills | Status: DC

## 2023-02-07 ENCOUNTER — Ambulatory Visit: Payer: Medicare Other | Admitting: Internal Medicine

## 2023-02-15 ENCOUNTER — Other Ambulatory Visit: Payer: Self-pay | Admitting: *Deleted

## 2023-02-15 DIAGNOSIS — M79604 Pain in right leg: Secondary | ICD-10-CM

## 2023-02-27 ENCOUNTER — Encounter: Payer: Self-pay | Admitting: Vascular Surgery

## 2023-02-27 ENCOUNTER — Ambulatory Visit (HOSPITAL_COMMUNITY)
Admission: RE | Admit: 2023-02-27 | Discharge: 2023-02-27 | Disposition: A | Discharge status: Home / Self Care | Payer: Medicare Other | Source: Ambulatory Visit | Attending: Vascular Surgery | Admitting: Vascular Surgery

## 2023-02-27 ENCOUNTER — Ambulatory Visit: Payer: Medicare Other | Admitting: Vascular Surgery

## 2023-02-27 VITALS — BP 148/93 | HR 74 | Temp 98.2°F | Resp 20 | Ht 71.0 in | Wt 250.7 lb

## 2023-02-27 DIAGNOSIS — M79605 Pain in left leg: Secondary | ICD-10-CM | POA: Diagnosis not present

## 2023-02-27 DIAGNOSIS — I739 Peripheral vascular disease, unspecified: Secondary | ICD-10-CM

## 2023-02-27 DIAGNOSIS — M79604 Pain in right leg: Secondary | ICD-10-CM | POA: Diagnosis not present

## 2023-02-27 LAB — VAS US ABI WITH/WO TBI
Left ABI: 1.31
Right ABI: 0.89

## 2023-02-27 NOTE — Progress Notes (Signed)
[facility] [place]  ASSESSMENT / PLAN: Zachary Valdez is a 72 y.o. male with atherosclerosis of native arteries of bilateral lower extremities causing intermittent claudication.  Patient counseled patients with asymptomatic peripheral arterial disease or claudication have a 1-2% risk of developing chronic limb threatening ischemia, but a 15-30% risk of mortality in the next 5 years. Intervention should only be considered for medically optimized patients with disabling symptoms.   Recommend the following which can slow the progression of atherosclerosis and reduce the risk of major adverse cardiac / limb events:  Complete cessation from all tobacco products. Blood glucose control with goal A1c < 7%. Blood pressure control with goal blood pressure < 140/90 mmHg. Lipid reduction therapy with goal LDL-C <100 mg/dL (<16 if symptomatic from PAD).  Aspirin 81mg  PO QD.  Atorvastatin 40-80mg  PO QD (or other "high intensity" statin therapy). Daily walking to and past the point of discomfort. Patient counseled to keep a log of exercise distance.  Follow-up with me in 12 months with repeat ABI for surveillance.  Patient counseled about limb threatening symptoms.  He knows to return to care soon as possible should these develop.  CHIEF COMPLAINT: Second opinion about claudication  HISTORY OF PRESENT ILLNESS: Zachary Valdez is a 72 y.o. male to clinic by Dr. Posey Rea for evaluation of fairly classic symptoms of intermittent claudication.  Patient reports he can walk a moderate distance before symptoms will begin.  This does not interfere with his activities of independent living.  He does not describe ischemic rest pain symptoms.  He has no active ulcers about his feet.  He would like very much to try to avoid intervention if at all possible.  02/27/23: Patient doing very well overall.  He has been making excellent lifestyle changes.  He has lost almost 40 pounds since I saw him last.   He has been exercising regularly.  He prefers to ride an exercise bike.  He is able to walk.  He does not endorses much cramping discomfort when he walks.  No ischemic ulceration or rest pain.  I congratulated him on his hard work and commitment to his health.  Past Medical History:  Diagnosis Date   Allergic rhinitis    Anxiety    Diabetes mellitus without complication (HCC)    GERD (gastroesophageal reflux disease)    Hyperlipidemia    Hyperplastic colon polyp 2006   Hypertension    OA (osteoarthritis)    knees,    Obesity    Sleep apnea    cpap-    Ulcerative esophagitis 2006   Dr. Russella Dar   Vitamin B 12 deficiency     Past Surgical History:  Procedure Laterality Date   KNEE ARTHROSCOPY     Right   LEFT HEART CATH AND CORONARY ANGIOGRAPHY N/A 11/15/2021   Procedure: LEFT HEART CATH AND CORONARY ANGIOGRAPHY;  Surgeon: Marykay Lex, MD;  Location: [facility] INVASIVE CV LAB;  Service: Cardiovascular;  Laterality: N/A;   TOTAL KNEE ARTHROPLASTY Left 09/07/2014   Procedure: LEFT TOTAL KNEE ARTHROPLASTY;  Surgeon: Loanne Drilling, MD;  Location: [facility] ORS;  Service: Orthopedics;  Laterality: Left;   TOTAL KNEE ARTHROPLASTY Right 03/14/2021   Procedure: TOTAL KNEE ARTHROPLASTY;  Surgeon: Ollen Gross, MD;  Location: [facility] OR[facility];  Service: Orthopedics;  Laterality: Right;     Family History  Problem Relation Age of Onset   Breast cancer Mother    Hypertension Mother    Hypertension Other    Colon  cancer Neg Hx     Social History   Socioeconomic History   Marital status: Divorced    Spouse name: Not on file   Number of children: Not on file   Years of education: Not on file   Highest education level: Not on file  Occupational History   Occupation: retired  Tobacco Use   Smoking status: Former    Packs/day: 1.50    Years: 15.00    Additional pack years: 0.00    Total pack years: 22.50    Types: Cigarettes    Quit date: 10/02/1997    Years since quitting: 25.4   Smokeless  tobacco: Never  Vaping Use   Vaping Use: Never used  Substance and Sexual Activity   Alcohol use: Yes    Alcohol/week: 14.0 standard drinks of alcohol    Types: 14 Glasses of wine per week   Drug use: No   Sexual activity: Not Currently  Other Topics Concern   Not on file  Social History Narrative   Daily caffeine- 2-3 per day   Social Determinants of Health   Financial Resource Strain: Low Risk  (12/07/2022)   Overall Financial Resource Strain (CARDIA)    Difficulty of Paying Living Expenses: Not hard at all  Food Insecurity: No Food Insecurity (12/07/2022)   Hunger Vital Sign    Worried About Running Out of Food in the Last Year: Never true    Ran Out of Food in the Last Year: Never true  Transportation Needs: No Transportation Needs (12/07/2022)   PRAPARE - Administrator, Civil Service (Medical): No    Lack of Transportation (Non-Medical): No  Physical Activity: Sufficiently Active (12/07/2022)   Exercise Vital Sign    Days of Exercise per Week: 5 days    Minutes of Exercise per Session: 30 min  Stress: No Stress Concern Present (12/07/2022)   [employer] of Occupational Health - Occupational Stress Questionnaire    Feeling of Stress : Not at all  Social Connections: Socially Isolated (12/07/2022)   Social Connection and Isolation Panel [NHANES]    Frequency of Communication with Friends and Family: Twice a week    Frequency of Social Gatherings with Friends and Family: Twice a week    Attends Religious Services: Never    Database administrator or Organizations: No    Attends Banker Meetings: Never    Marital Status: Divorced  Catering manager Violence: Not At Risk (12/07/2022)   Humiliation, Afraid, Rape, and Kick questionnaire    Fear of Current or Ex-Partner: No    Emotionally Abused: No    Physically Abused: No    Sexually Abused: No    Allergies  Allergen Reactions   Hydrocodone-Acetaminophen Shortness Of Breath    REACTION: SOB, patient  states drug made patient feel weird    Atorvastatin     REACTION: achy   Candesartan Cough   Dilaudid [Hydromorphone]     hallucinations   Enalapril Cough   Losartan Cough   Nexium [Esomeprazole] Diarrhea   Tramadol     headache   Wellbutrin [Bupropion] Anxiety    Severe     Current Outpatient Medications  Medication Sig Dispense Refill   ALPRAZolam (XANAX) 0.5 MG tablet Take 1 tablet (0.5 mg total) by mouth 2 (two) times daily as needed for anxiety. 60 tablet 1   aspirin EC 81 MG tablet Take 1 tablet (81 mg total) by mouth in the morning and at bedtime. 100 tablet 3  cefdinir (OMNICEF) 300 MG capsule Take 1 capsule (300 mg total) by mouth 2 (two) times daily. 20 capsule 0   clobetasol ointment (TEMOVATE) 0.05 % Apply 1 Application topically 2 (two) times daily. 120 g 3   metFORMIN (GLUCOPHAGE-XR) 750 MG 24 hr tablet TAKE ONE TABLET BY MOUTH WITH BREAKFAST 90 tablet 3   olmesartan (BENICAR) 40 MG tablet TAKE ONE TABLET BY MOUTH DAILY 90 tablet 3   pantoprazole (PROTONIX) 40 MG tablet TAKE ONE TABLET BY MOUTH DAILY AS NEEDED 90 tablet 3   pentoxifylline (TRENTAL) 400 MG CR tablet Take 1 tablet (400 mg total) by mouth 3 (three) times daily with meals. 90 tablet 11   rosuvastatin (CRESTOR) 20 MG tablet TAKE 1 TABLET BY MOUTH DAILY 90 tablet 0   tirzepatide (MOUNJARO) 5 MG/0.5ML Pen Inject 5 mg into the skin once a week. 6 mL 3   vitamin B-12 (CYANOCOBALAMIN) 1000 MCG tablet Take 1 tablet (1,000 mcg total) by mouth daily. 100 tablet 3   isosorbide mononitrate (IMDUR) 30 MG 24 hr tablet Take 1 tablet (30 mg total) by mouth daily. (Patient not taking: Reported on 08/08/2022) 90 tablet 3   nitroGLYCERIN (NITROSTAT) 0.4 MG SL tablet Place 1 tablet (0.4 mg total) under the tongue every 5 (five) minutes as needed for chest pain. 90 tablet 3   No current facility-administered medications for this visit.    PHYSICAL EXAM Vitals:   02/27/23 1426  BP: (!) 148/93  Pulse: 74  Resp: 20  Temp:  98.2 F (36.8 C)  SpO2: 96%  Weight: 250 lb 11.2 oz (113.7 kg)  Height: 5\' 11"  (1.803 m)    Well-appearing elderly gentleman in no acute distress Regular rate and rhythm Unlabored breathing No palpable pedal pulses No ulcers about the feet  PERTINENT LABORATORY AND RADIOLOGIC DATA  Most recent CBC    Latest Ref Rng & Units 06/29/2022    8:09 AM 11/11/2021    2:12 PM 11/03/2021    1:27 PM  CBC  WBC 4.0 - 10.5 K/uL 4.9  6.7  8.4   Hemoglobin 13.0 - 17.0 g/dL 10.2  72.5  36.6   Hematocrit 39.0 - 52.0 % 44.2  45.5  45.9   Platelets 150.0 - 400.0 K/uL 104.0  117  108      Most recent CMP    Latest Ref Rng & Units 06/29/2022    8:09 AM 11/11/2021    2:12 PM 11/03/2021    1:27 PM  CMP  Glucose 70 - 99 mg/dL 440  347  425   BUN 6 - 23 mg/dL 16  14  14    Creatinine 0.40 - 1.50 mg/dL 9.56  3.87  5.64   Sodium 135 - 145 mEq/L 139  140  135   Potassium 3.5 - 5.1 mEq/L 4.1  4.1  4.2   Chloride 96 - 112 mEq/L 104  103  103   CO2 19 - 32 mEq/L 27  19  24    Calcium 8.4 - 10.5 mg/dL 9.5  9.7  9.5   Total Protein 6.0 - 8.3 g/dL 6.9     Total Bilirubin 0.2 - 1.2 mg/dL 0.9     Alkaline Phos 39 - 117 U/L 60     AST 0 - 37 U/L 27     ALT 0 - 53 U/L 23       Renal function CrCl cannot be calculated (Patient's most recent lab result is older than the maximum 21 days allowed.).  Hgb  A1c MFr Bld (%)  Date Value  06/29/2022 5.9    LDL Chol Calc (NIH)  Date Value Ref Range Status  09/23/2019 119 (H) 0 - 99 mg/dL Final   LDL Cholesterol  Date Value Ref Range Status  06/29/2022 92 0 - 99 mg/dL Final   Direct LDL  Date Value Ref Range Status  10/07/2010 146.2 mg/dL Final    Comment:    Optimal:  <100 mg/dLNear or Above Optimal:  100-129 mg/dLBorderline High:  130-159 mg/dLHigh:  160-189 mg/dLVery High:  >190 mg/dL    +-------+-----------+-----------+------------+------------+  ABI/TBIToday's ABIToday's TBIPrevious ABIPrevious TBI   +-------+-----------+-----------+------------+------------+  Right 0.89       0.52       0.68        0.48          +-------+-----------+-----------+------------+------------+  Left  1.31       0.86       1.23        0.65          +-------+-----------+-----------+------------+------------+    Rande Brunt. Lenell Antu, MD FACS [facility] Phone Number: [phone] 02/27/2023 4:21 PM   Total time spent on preparing this encounter including chart review, data review, collecting history, examining the patient, coordinating care for this established patient, 30 minutes  Portions of this report may have been transcribed using voice recognition software.  Every effort has been made to ensure accuracy; however, inadvertent computerized transcription errors may still be present.

## 2023-03-05 ENCOUNTER — Other Ambulatory Visit: Payer: Self-pay

## 2023-03-05 DIAGNOSIS — I739 Peripheral vascular disease, unspecified: Secondary | ICD-10-CM

## 2023-03-07 ENCOUNTER — Ambulatory Visit: Payer: Medicare Other | Admitting: Internal Medicine

## 2023-03-22 ENCOUNTER — Ambulatory Visit: Payer: Medicare Other | Admitting: Internal Medicine

## 2023-05-22 ENCOUNTER — Other Ambulatory Visit: Payer: Self-pay | Admitting: *Deleted

## 2023-05-22 MED ORDER — ROSUVASTATIN CALCIUM 20 MG PO TABS: 20.0000 mg | ORAL_TABLET | Freq: Every day | ORAL | 1 refills | Status: DC

## 2023-07-17 ENCOUNTER — Other Ambulatory Visit: Payer: Self-pay | Admitting: Internal Medicine

## 2023-08-02 ENCOUNTER — Other Ambulatory Visit: Payer: Self-pay | Admitting: Internal Medicine

## 2023-08-10 ENCOUNTER — Encounter: Payer: Self-pay | Admitting: Gastroenterology

## 2023-09-02 ENCOUNTER — Other Ambulatory Visit: Payer: Self-pay | Admitting: Internal Medicine

## 2023-09-12 ENCOUNTER — Other Ambulatory Visit: Payer: Self-pay | Admitting: Internal Medicine

## 2023-10-24 DIAGNOSIS — G4733 Obstructive sleep apnea (adult) (pediatric): Secondary | ICD-10-CM | POA: Diagnosis not present

## 2023-12-03 ENCOUNTER — Encounter: Payer: Self-pay | Admitting: Internal Medicine

## 2023-12-05 ENCOUNTER — Other Ambulatory Visit: Payer: Self-pay | Admitting: Internal Medicine

## 2023-12-05 DIAGNOSIS — R7309 Other abnormal glucose: Secondary | ICD-10-CM

## 2023-12-05 DIAGNOSIS — Z0000 Encounter for general adult medical examination without abnormal findings: Secondary | ICD-10-CM

## 2023-12-05 DIAGNOSIS — E785 Hyperlipidemia, unspecified: Secondary | ICD-10-CM

## 2023-12-07 ENCOUNTER — Other Ambulatory Visit: Payer: Self-pay | Admitting: Internal Medicine

## 2023-12-10 ENCOUNTER — Ambulatory Visit (INDEPENDENT_AMBULATORY_CARE_PROVIDER_SITE_OTHER): Payer: Medicare Other

## 2023-12-10 VITALS — BP 132/78 | HR 82 | Ht 71.5 in | Wt 254.4 lb

## 2023-12-10 DIAGNOSIS — E119 Type 2 diabetes mellitus without complications: Secondary | ICD-10-CM

## 2023-12-10 DIAGNOSIS — Z860100 Personal history of colon polyps, unspecified: Secondary | ICD-10-CM | POA: Diagnosis not present

## 2023-12-10 DIAGNOSIS — Z0100 Encounter for examination of eyes and vision without abnormal findings: Secondary | ICD-10-CM

## 2023-12-10 DIAGNOSIS — Z0000 Encounter for general adult medical examination without abnormal findings: Secondary | ICD-10-CM | POA: Diagnosis not present

## 2023-12-10 NOTE — Progress Notes (Cosign Needed Addendum)
 Subjective:   Zachary Valdez is a 73 y.o. who presents for a Medicare Wellness preventive visit.  Visit Complete: In person   AWV Questionnaire: No: Patient Medicare AWV questionnaire was not completed prior to this visit.  Cardiac Risk Factors include: advanced age (>62men, >1 women);male gender;diabetes mellitus;dyslipidemia;hypertension;obesity (BMI >30kg/m2)     Objective:    Today's Vitals   12/10/23 1056  BP: 132/78  Pulse: 82  SpO2: 97%  Weight: 254 lb 6.4 oz (115.4 kg)  Height: 5' 11.5" (1.816 m)   Body mass index is 34.99 kg/m.     12/10/2023   10:49 AM 12/07/2022    9:37 AM 12/05/2021    1:08 PM 11/15/2021    9:35 AM 11/03/2021    1:27 PM 03/14/2021    7:35 PM 03/04/2021    1:49 PM  Advanced Directives  Does Patient Have a Medical Advance Directive? Yes Yes Yes Yes No Yes Yes  Type of Estate agent of [place];Living will Healthcare Power of [place];Living will Healthcare Power of [place];Living will Healthcare Power of [place];Living will  Healthcare Power of [place];Living will Healthcare Power of [place];Living will  Does patient want to make changes to medical advance directive? No - Patient declined     No - Patient declined   Copy of Healthcare Power of Attorney in Chart? Yes - validated most recent copy scanned in chart (See row information) No - copy requested No - copy requested   No - copy requested     Current Medications (verified) Outpatient Encounter Medications as of 12/10/2023  Medication Sig   ALPRAZolam (XANAX) 0.5 MG tablet Take 1 tablet (0.5 mg total) by mouth 2 (two) times daily as needed for anxiety.   clobetasol ointment (TEMOVATE) 0.05 % Apply 1 Application topically 2 (two) times daily.   metFORMIN (GLUCOPHAGE-XR) 750 MG 24 hr tablet TAKE 1 TABLET BY MOUTH DAILY WITH BREAKFAST   olmesartan (BENICAR) 40 MG tablet TAKE 1 TABLET BY MOUTH DAILY   pantoprazole (PROTONIX) 40 MG tablet TAKE 1 TABLET BY MOUTH DAILY AS NEEDED    rosuvastatin (CRESTOR) 20 MG tablet Take 1 tablet (20 mg total) by mouth daily.   tirzepatide [facility]) 5 MG/0.5ML Pen Inject 5 mg into the skin once a week.   vitamin B-12 (CYANOCOBALAMIN) 1000 MCG tablet Take 1 tablet (1,000 mcg total) by mouth daily.   nitroGLYCERIN (NITROSTAT) 0.4 MG SL tablet Place 1 tablet (0.4 mg total) under the tongue every 5 (five) minutes as needed for chest pain.   [DISCONTINUED] cefdinir (OMNICEF) 300 MG capsule Take 1 capsule (300 mg total) by mouth 2 (two) times daily.   [DISCONTINUED] isosorbide mononitrate (IMDUR) 30 MG 24 hr tablet Take 1 tablet (30 mg total) by mouth daily. (Patient not taking: Reported on 08/08/2022)   [DISCONTINUED] pentoxifylline (TRENTAL) 400 MG CR tablet Take 1 tablet (400 mg total) by mouth 3 (three) times daily with meals.   No facility-administered encounter medications on file as of 12/10/2023.    Allergies (verified) Hydrocodone-acetaminophen, Atorvastatin, Candesartan, Dilaudid [hydromorphone], Enalapril, Losartan, Nexium [esomeprazole], Tramadol, and Wellbutrin [bupropion]   History: Past Medical History:  Diagnosis Date   Allergic rhinitis    Anxiety    Diabetes mellitus without complication (HCC)    GERD (gastroesophageal reflux disease)    Hyperlipidemia    Hyperplastic colon polyp 2006   Hypertension    OA (osteoarthritis)    knees,    Obesity    Sleep apnea    cpap-  Ulcerative esophagitis 2006   Dr. Russella Dar   Vitamin B 12 deficiency    Past Surgical History:  Procedure Laterality Date   KNEE ARTHROSCOPY     Right   LEFT HEART CATH AND CORONARY ANGIOGRAPHY N/A 11/15/2021   Procedure: LEFT HEART CATH AND CORONARY ANGIOGRAPHY;  Surgeon: Marykay Lex, MD;  Location: [facility] INVASIVE CV LAB;  Service: Cardiovascular;  Laterality: N/A;   TOTAL KNEE ARTHROPLASTY Left 09/07/2014   Procedure: LEFT TOTAL KNEE ARTHROPLASTY;  Surgeon: Loanne Drilling, MD;  Location: [facility] ORS;  Service: Orthopedics;  Laterality: Left;    TOTAL KNEE ARTHROPLASTY Right 03/14/2021   Procedure: TOTAL KNEE ARTHROPLASTY;  Surgeon: Ollen Gross, MD;  Location: [facility] OR[facility];  Service: Orthopedics;  Laterality: Right;    Family History  Problem Relation Age of Onset   Breast cancer Mother    Hypertension Mother    Hypertension Other    Colon cancer Neg Hx    Social History   Socioeconomic History   Marital status: Divorced    Spouse name: Not on file   Number of children: Not on file   Years of education: Not on file   Highest education level: Not on file  Occupational History   Occupation: retired  Tobacco Use   Smoking status: Former    Current packs/day: 0.00    Average packs/day: 1.5 packs/day for 18.0 years (27.0 ttl pk-yrs)    Types: Cigarettes    Start date: 10/02/1980    Quit date: 2000    Years since quitting: 25.2    Passive exposure: Past   Smokeless tobacco: Never  Vaping Use   Vaping status: Never Used  Substance and Sexual Activity   Alcohol use: Not Currently    Alcohol/week: 2.0 standard drinks of alcohol    Types: 2 Cans of beer per week    Comment: 8-10 beers per week   Drug use: No   Sexual activity: Not Currently  Other Topics Concern   Not on file  Social History Narrative   Daily caffeine- 2-3 per day   Social Drivers of Health   Financial Resource Strain: Low Risk  (12/10/2023)   Overall Financial Resource Strain (CARDIA)    Difficulty of Paying Living Expenses: Not hard at all  Food Insecurity: No Food Insecurity (12/10/2023)   Hunger Vital Sign    Worried About Running Out of Food in the Last Year: Never true    Ran Out of Food in the Last Year: Never true  Transportation Needs: No Transportation Needs (12/10/2023)   PRAPARE - Administrator, Civil Service (Medical): No    Lack of Transportation (Non-Medical): No  Physical Activity: Sufficiently Active (12/10/2023)   Exercise Vital Sign    Days of Exercise per Week: 4 days    Minutes of Exercise per Session: 40 min   Stress: No Stress Concern Present (12/10/2023)   [employer] of Occupational Health - Occupational Stress Questionnaire    Feeling of Stress : Not at all  Social Connections: Moderately Integrated (12/10/2023)   Social Connection and Isolation Panel [NHANES]    Frequency of Communication with Friends and Family: More than three times a week    Frequency of Social Gatherings with Friends and Family: Never    Attends Religious Services: More than 4 times per year    Active Member of [employer] or Organizations: Yes    Attends Banker Meetings: 1 to 4 times per year    Marital Status:  Divorced    Tobacco Counseling Counseling given: Yes    Clinical Intake:  Pre-visit preparation completed: Yes  Pain : No/denies pain     BMI - recorded: 34.99 Nutritional Status: BMI > 30  Obese Nutritional Risks: None Diabetes: Yes CBG done?: No Did pt. bring in CBG monitor from home?: No  How often do you need to have someone help you when you read instructions, pamphlets, or other written materials from your doctor or pharmacy?: 1 - Never  Interpreter Needed?: No  Information entered by :: Hassell Halim, CMA   Activities of Daily Living     12/10/2023   11:00 AM  In your present state of health, do you have any difficulty performing the following activities:  Hearing? 0  Vision? 0  Difficulty concentrating or making decisions? 0  Walking or climbing stairs? 0  Dressing or bathing? 0  Doing errands, shopping? 0  Preparing Food and eating ? N  Using the Toilet? N  In the past six months, have you accidently leaked urine? N  Do you have problems with loss of bowel control? N  Managing your Medications? N  Managing your Finances? N  Housekeeping or managing your Housekeeping? N    Patient Care Team: Plotnikov, Georgina Quint, MD as PCP - General Thomasene Ripple, DO as PCP - Cardiology (Cardiology) Ernesto Rutherford, MD as Consulting Physician (Ophthalmology) Ollen Gross,  MD as Consulting Physician (Orthopedic Surgery) Runell Gess, MD as Consulting Physician (Cardiology)  Indicate any recent Medical Services you may have received from other than [facility] providers in the past year (date may be approximate).     Assessment:   This is a routine wellness examination for Kassim.  Hearing/Vision screen Hearing Screening - Comments:: Denies hearing difficulties   Vision Screening - Comments:: Wears eyeglasses - new referral placed today   Goals Addressed               This Visit's Progress     Patient Stated (pt-stated)        Patient stated he wants to lose weight (20-30lbs).       Depression Screen     12/10/2023   11:07 AM 12/07/2022    9:12 AM 11/09/2022    2:38 PM 07/03/2022   11:37 AM 12/05/2021    1:09 PM 12/05/2021    1:07 PM 11/29/2021    2:00 PM  PHQ 2/9 Scores  PHQ - 2 Score 0 0 0 0 0 0 0  PHQ- 9 Score 0 0  0       Fall Risk     12/10/2023   11:09 AM 12/07/2022    9:13 AM 11/09/2022    2:38 PM 07/03/2022   11:37 AM 12/05/2021    1:09 PM  Fall Risk   Falls in the past year? 0 0 0 0 0  Number falls in past yr: 0 0 0 0 0  Injury with Fall? 0 0 0 0 0  Risk for fall due to : No Fall Risks No Fall Risks No Fall Risks No Fall Risks No Fall Risks  Follow up Falls prevention discussed;Falls evaluation completed Falls prevention discussed Falls evaluation completed  Falls evaluation completed    MEDICARE RISK AT HOME:  Medicare Risk at Home Any stairs in or around the home?: No If so, are there any without handrails?: No Home free of loose throw rugs in walkways, pet beds, electrical cords, etc?: Yes Adequate lighting in your home to reduce risk of  falls?: Yes Life alert?: No Use of a cane, walker or w/c?: No Grab bars in the bathroom?: No Shower chair or bench in shower?: No Elevated toilet seat or a handicapped toilet?: Yes  TIMED UP AND GO:  Was the test performed?  No  Cognitive Function: 6CIT completed        12/10/2023    11:09 AM 12/07/2022    9:14 AM  6CIT Screen  What Year? 0 points 0 points  What month? 0 points 0 points  What time? 0 points 0 points  Count back from 20 0 points 0 points  Months in reverse 0 points 0 points  Repeat phrase 0 points 0 points  Total Score 0 points 0 points    Immunizations Immunization History  Administered Date(s) Administered   Fluad Quad(high Dose 65+) 07/28/2020, 07/20/2021   Influenza Whole 10/03/2005, 07/03/2011   Influenza,inj,Quad PF,6+ Mos 07/12/2019   Influenza,inj,quad, With Preservative 07/09/2019   Influenza-Unspecified 07/10/2016, 07/19/2017, 07/04/2018   PFIZER(Purple Top)SARS-COV-2 Vaccination 11/10/2019, 12/04/2019   Td 04/30/1998, 10/02/2006   Tdap 07/06/2013   Unspecified SARS-COV-2 Vaccination 11/10/2019    Screening Tests Health Maintenance  Topic Date Due   Pneumonia Vaccine 67+ Years old (1 of 2 - PCV) Never done   FOOT EXAM  Never done   OPHTHALMOLOGY EXAM  Never done   Diabetic Valdez evaluation - Urine ACR  Never done   Colonoscopy  06/07/2021   HEMOGLOBIN A1C  12/28/2022   INFLUENZA VACCINE  05/03/2023   COVID-19 Vaccine (4 - 2024-25 season) 06/03/2023   Diabetic Valdez evaluation - eGFR measurement  06/30/2023   DTaP/Tdap/Td (4 - Td or Tdap) 07/07/2023   Medicare Annual Wellness (AWV)  12/09/2024   Hepatitis C Screening  Completed   HPV VACCINES  Aged Out   Zoster Vaccines- Shingrix  Discontinued    Health Maintenance  Health Maintenance Due  Topic Date Due   Pneumonia Vaccine 70+ Years old (1 of 2 - PCV) Never done   FOOT EXAM  Never done   OPHTHALMOLOGY EXAM  Never done   Diabetic Valdez evaluation - Urine ACR  Never done   Colonoscopy  06/07/2021   HEMOGLOBIN A1C  12/28/2022   INFLUENZA VACCINE  05/03/2023   COVID-19 Vaccine (4 - 2024-25 season) 06/03/2023   Diabetic Valdez evaluation - eGFR measurement  06/30/2023   DTaP/Tdap/Td (4 - Td or Tdap) 07/07/2023   Health Maintenance Items Addressed:Pt declines the  Tdap and Pneumonia vaccines.  Referral sent to GI for colonoscopy, Referral sent to Optometry/Ophthalmology ordered today.  Additional Screening:  Vision Screening: Recommended annual ophthalmology exams for early detection of glaucoma and other disorders of the eye. Pt requested a new referral to [facility].   Dental Screening: Recommended annual dental exams for proper oral hygiene  Community Resource Referral / Chronic Care Management: CRR required this visit?  No   CCM required this visit?  No     Plan:     I have personally reviewed and noted the following in the patient's chart:   Medical and social history Use of alcohol, tobacco or illicit drugs  Current medications and supplements including opioid prescriptions. Patient is not currently taking opioid prescriptions. Functional ability and status Nutritional status Physical activity Advanced directives List of other physicians Hospitalizations, surgeries, and ER visits in previous 12 months Vitals Screenings to include cognitive, depression, and falls Referrals and appointments  In addition, I have reviewed and discussed with patient certain preventive protocols, quality metrics, and best  practice recommendations. A written personalized care plan for preventive services as well as general preventive health recommendations were provided to patient.     Darreld Mclean, CMA   12/10/2023   After Visit Summary: (MyChart) Due to this being a telephonic visit, the after visit summary with patients personalized plan was offered to patient via MyChart   Notes: Please refer to Routing Comments.  Medical screening examination/treatment/procedure(s) were performed by non-physician practitioner and as supervising physician I was immediately available for consultation/collaboration.  I agree with above. Jacinta Shoe, MD

## 2023-12-10 NOTE — Patient Instructions (Addendum)
 Zachary Valdez , Thank you for taking time to come for your Medicare Wellness Visit. I appreciate your ongoing commitment to your health goals. Please review the following plan we discussed and let me know if I can assist you in the future.   Referrals/Orders/Follow-Ups/Clinician Recommendations: Aim for 30 minutes of exercise or brisk walking, 6-8 glasses of water, and 5 servings of fruits and vegetables each day. Referral to [facility] (Ophthalmology) and a repeat Colonoscopy w/Dr Russella Dar.  Educated and advised on getting the Tdap and Pneumonia vaccines.    This is a list of the screening recommended for you and due dates:  Health Maintenance  Topic Date Due   Pneumonia Vaccine (1 of 2 - PCV) Never done   Complete foot exam   Never done   Eye exam for diabetics  Never done   Yearly kidney health urinalysis for diabetes  Never done   Colon Cancer Screening  06/07/2021   Hemoglobin A1C  12/28/2022   Flu Shot  05/03/2023   COVID-19 Vaccine (4 - 2024-25 season) 06/03/2023   Yearly kidney function blood test for diabetes  06/30/2023   DTaP/Tdap/Td vaccine (4 - Td or Tdap) 07/07/2023   Medicare Annual Wellness Visit  12/09/2024   Hepatitis C Screening  Completed   HPV Vaccine  Aged Out   Zoster (Shingles) Vaccine  Discontinued    Advanced directives: (In Chart) A copy of your advanced directives are scanned into your chart should your provider ever need it.  Next Medicare Annual Wellness Visit scheduled for next year: Yes - 2026

## 2023-12-11 ENCOUNTER — Other Ambulatory Visit (INDEPENDENT_AMBULATORY_CARE_PROVIDER_SITE_OTHER)

## 2023-12-11 DIAGNOSIS — Z0000 Encounter for general adult medical examination without abnormal findings: Secondary | ICD-10-CM | POA: Diagnosis not present

## 2023-12-11 DIAGNOSIS — R7309 Other abnormal glucose: Secondary | ICD-10-CM | POA: Diagnosis not present

## 2023-12-11 DIAGNOSIS — E785 Hyperlipidemia, unspecified: Secondary | ICD-10-CM

## 2023-12-11 LAB — URINALYSIS
Bilirubin Urine: NEGATIVE
Hgb urine dipstick: NEGATIVE
Ketones, ur: NEGATIVE
Leukocytes,Ua: NEGATIVE
Nitrite: NEGATIVE
Specific Gravity, Urine: 1.015 (ref 1.000–1.030)
Total Protein, Urine: NEGATIVE
Urine Glucose: NEGATIVE
Urobilinogen, UA: 0.2 (ref 0.0–1.0)
pH: 6 (ref 5.0–8.0)

## 2023-12-11 LAB — COMPREHENSIVE METABOLIC PANEL
ALT: 29 U/L (ref 0–53)
AST: 32 U/L (ref 0–37)
Albumin: 4.4 g/dL (ref 3.5–5.2)
Alkaline Phosphatase: 67 U/L (ref 39–117)
BUN: 13 mg/dL (ref 6–23)
CO2: 27 meq/L (ref 19–32)
Calcium: 9.5 mg/dL (ref 8.4–10.5)
Chloride: 103 meq/L (ref 96–112)
Creatinine, Ser: 0.92 mg/dL (ref 0.40–1.50)
GFR: 84.08 mL/min (ref >60.00)
Glucose, Bld: 96 mg/dL (ref 70–99)
Potassium: 4.5 meq/L (ref 3.5–5.1)
Sodium: 139 meq/L (ref 135–145)
Total Bilirubin: 1 mg/dL (ref 0.2–1.2)
Total Protein: 7.1 g/dL (ref 6.0–8.3)

## 2023-12-11 LAB — LIPID PANEL
Cholesterol: 131 mg/dL (ref 0–200)
HDL: 44.1 mg/dL (ref >39.00)
LDL Cholesterol: 68 mg/dL (ref 0–99)
NonHDL: 86.91
Total CHOL/HDL Ratio: 3
Triglycerides: 97 mg/dL (ref 0.0–149.0)
VLDL: 19.4 mg/dL (ref 0.0–40.0)

## 2023-12-11 LAB — CBC WITH DIFFERENTIAL/PLATELET
Basophils Absolute: 0 10*3/uL (ref 0.0–0.1)
Basophils Relative: 0.5 % (ref 0.0–3.0)
Eosinophils Absolute: 0.2 10*3/uL (ref 0.0–0.7)
Eosinophils Relative: 4.4 % (ref 0.0–5.0)
HCT: 48 % (ref 39.0–52.0)
Hemoglobin: 16.4 g/dL (ref 13.0–17.0)
Lymphocytes Relative: 23.3 % (ref 12.0–46.0)
Lymphs Abs: 1.1 10*3/uL (ref 0.7–4.0)
MCHC: 34.1 g/dL (ref 30.0–36.0)
MCV: 103.9 fl — ABNORMAL HIGH (ref 78.0–100.0)
Monocytes Absolute: 0.6 10*3/uL (ref 0.1–1.0)
Monocytes Relative: 11.8 % (ref 3.0–12.0)
Neutro Abs: 2.8 10*3/uL (ref 1.4–7.7)
Neutrophils Relative %: 60 % (ref 43.0–77.0)
Platelets: 105 10*3/uL — ABNORMAL LOW (ref 150.0–400.0)
RBC: 4.62 Mil/uL (ref 4.22–5.81)
RDW: 12.6 % (ref 11.5–15.5)
WBC: 4.7 10*3/uL (ref 4.0–10.5)

## 2023-12-11 LAB — MICROALBUMIN / CREATININE URINE RATIO
Creatinine,U: 140.3 mg/dL
Microalb Creat Ratio: 7.9 mg/g (ref 0.0–30.0)
Microalb, Ur: 1.1 mg/dL (ref 0.0–1.9)

## 2023-12-11 LAB — PSA: PSA: 0.83 ng/mL (ref 0.10–4.00)

## 2023-12-11 LAB — TSH: TSH: 4.05 u[IU]/mL (ref 0.35–5.50)

## 2023-12-11 LAB — HEMOGLOBIN A1C: Hgb A1c MFr Bld: 5.6 % (ref 4.6–6.5)

## 2023-12-12 ENCOUNTER — Encounter: Payer: Self-pay | Admitting: Internal Medicine

## 2023-12-12 ENCOUNTER — Ambulatory Visit (INDEPENDENT_AMBULATORY_CARE_PROVIDER_SITE_OTHER): Payer: Medicare Other | Admitting: Internal Medicine

## 2023-12-12 VITALS — BP 112/82 | HR 78 | Temp 98.8°F | Ht 71.5 in | Wt 252.0 lb

## 2023-12-12 DIAGNOSIS — Z0000 Encounter for general adult medical examination without abnormal findings: Secondary | ICD-10-CM | POA: Diagnosis not present

## 2023-12-12 DIAGNOSIS — Z7984 Long term (current) use of oral hypoglycemic drugs: Secondary | ICD-10-CM | POA: Diagnosis not present

## 2023-12-12 DIAGNOSIS — Z7985 Long-term (current) use of injectable non-insulin antidiabetic drugs: Secondary | ICD-10-CM | POA: Diagnosis not present

## 2023-12-12 DIAGNOSIS — F419 Anxiety disorder, unspecified: Secondary | ICD-10-CM

## 2023-12-12 DIAGNOSIS — E538 Deficiency of other specified B group vitamins: Secondary | ICD-10-CM | POA: Diagnosis not present

## 2023-12-12 DIAGNOSIS — I25119 Atherosclerotic heart disease of native coronary artery with unspecified angina pectoris: Secondary | ICD-10-CM

## 2023-12-12 DIAGNOSIS — E669 Obesity, unspecified: Secondary | ICD-10-CM

## 2023-12-12 DIAGNOSIS — E1169 Type 2 diabetes mellitus with other specified complication: Secondary | ICD-10-CM

## 2023-12-12 MED ORDER — MELOXICAM 15 MG PO TABS: 15.0000 mg | ORAL_TABLET | Freq: Every day | ORAL | 1 refills | Status: AC | PRN

## 2023-12-12 MED ORDER — CLOBETASOL PROPIONATE 0.05 % EX OINT: 1.0000 | TOPICAL_OINTMENT | Freq: Two times a day (BID) | CUTANEOUS | 3 refills | Status: DC

## 2023-12-12 MED ORDER — ALPRAZOLAM 0.5 MG PO TABS: 0.5000 mg | ORAL_TABLET | Freq: Two times a day (BID) | ORAL | 1 refills | Status: AC | PRN

## 2023-12-12 MED ORDER — OLMESARTAN MEDOXOMIL 40 MG PO TABS: 40.0000 mg | ORAL_TABLET | Freq: Every day | ORAL | 3 refills | Status: DC

## 2023-12-12 MED ORDER — TIRZEPATIDE 5 MG/0.5ML ~~LOC~~ SOAJ: 5.0000 mg | SUBCUTANEOUS | 3 refills | Status: DC

## 2023-12-12 MED ORDER — ROSUVASTATIN CALCIUM 20 MG PO TABS: 20.0000 mg | ORAL_TABLET | Freq: Every day | ORAL | 1 refills | Status: DC

## 2023-12-12 MED ORDER — PANTOPRAZOLE SODIUM 40 MG PO TBEC: 40.0000 mg | DELAYED_RELEASE_TABLET | Freq: Every day | ORAL | 3 refills | Status: AC | PRN

## 2023-12-12 MED ORDER — VITAMIN B-12 1000 MCG PO TABS: 1000.0000 ug | ORAL_TABLET | Freq: Every day | ORAL | 3 refills | Status: AC

## 2023-12-12 NOTE — Assessment & Plan Note (Signed)
 We discussed age appropriate health related issues, including available/recomended screening tests and vaccinations. Labs were ordered to be later reviewed . All questions were answered. We discussed one or more of the following - seat belt use, use of sunscreen/sun exposure exercise, fall risk reduction, second hand smoke exposure, firearm use and storage, seat belt use, a need for adhering to healthy diet and exercise. Labs were ordered.  All questions were answered. Colon id past due 2025 Rectal - per GI

## 2023-12-12 NOTE — Patient Instructions (Signed)
 [facility]     Amedeo Plenty - "The [place] [employer]"  Sign up for [employer] ( via [employer] on your phone or your ipad). If you don't have a Engineering geologist card  - go to [employer] branch. They will set you up in 15 minutes. It is free. You can check out books to read and to listen, check out magazines and newspapers, movies etc.

## 2023-12-12 NOTE — Progress Notes (Signed)
 Subjective:  Patient ID: Zachary Valdez, male    DOB: [birth year]  Age: 73 y.o. MRN: [id number]  CC: Annual Exam (No concerns )   HPI  WILTON THRALL presents for well exam C/o rash from Amoxicillin (started on day #7)   Outpatient Medications Prior to Visit  Medication Sig Dispense Refill   ALPRAZolam (XANAX) 0.5 MG tablet Take 1 tablet (0.5 mg total) by mouth 2 (two) times daily as needed for anxiety. 60 tablet 1   clobetasol ointment (TEMOVATE) 0.05 % Apply 1 Application topically 2 (two) times daily. 120 g 3   meloxicam (MOBIC) 15 MG tablet Take 15 mg by mouth daily.     metFORMIN (GLUCOPHAGE-XR) 750 MG 24 hr tablet TAKE 1 TABLET BY MOUTH DAILY WITH BREAKFAST 90 tablet 3   olmesartan (BENICAR) 40 MG tablet TAKE 1 TABLET BY MOUTH DAILY 90 tablet 3   pantoprazole (PROTONIX) 40 MG tablet TAKE 1 TABLET BY MOUTH DAILY AS NEEDED 90 tablet 3   rosuvastatin (CRESTOR) 20 MG tablet Take 1 tablet (20 mg total) by mouth daily. 90 tablet 1   tirzepatide (MOUNJARO) 5 MG/0.5ML Pen Inject 5 mg into the skin once a week. 6 mL 3   vitamin B-12 (CYANOCOBALAMIN) 1000 MCG tablet Take 1 tablet (1,000 mcg total) by mouth daily. 100 tablet 3   nitroGLYCERIN (NITROSTAT) 0.4 MG SL tablet Place 1 tablet (0.4 mg total) under the tongue every 5 (five) minutes as needed for chest pain. 90 tablet 3   No facility-administered medications prior to visit.    ROS: Review of Systems  Constitutional:  Negative for appetite change, fatigue and unexpected weight change.  HENT:  Negative for congestion, nosebleeds, sneezing, sore throat and trouble swallowing.   Eyes:  Negative for itching and visual disturbance.  Respiratory:  Negative for cough.   Cardiovascular:  Negative for chest pain, palpitations and leg swelling.  Gastrointestinal:  Negative for abdominal distention, blood in stool, diarrhea and nausea.  Genitourinary:  Negative for frequency and hematuria.  Musculoskeletal:  Negative for back pain, gait  problem, joint swelling and neck pain.  Skin:  Negative for rash.  Neurological:  Negative for dizziness, tremors, speech difficulty and weakness.  Psychiatric/Behavioral:  Negative for agitation, dysphoric mood, sleep disturbance and suicidal ideas. The patient is not nervous/anxious.     Objective:  BP 112/82 (BP Location: Left Arm, Patient Position: Sitting, Cuff Size: Normal)   Pulse 78   Temp 98.8 F (37.1 C) (Oral)   Ht 5' 11.5" (1.816 m)   Wt 252 lb (114.3 kg)   SpO2 98%   BMI 34.66 kg/m   BP Readings from Last 3 Encounters:  12/12/23 112/82  12/10/23 132/78  02/27/23 (!) 148/93    Wt Readings from Last 3 Encounters:  12/12/23 252 lb (114.3 kg)  12/10/23 254 lb 6.4 oz (115.4 kg)  02/27/23 250 lb 11.2 oz (113.7 kg)    Physical Exam Constitutional:      General: He is not in acute distress.    Appearance: He is well-developed. He is obese.     Comments: NAD  Eyes:     Conjunctiva/sclera: Conjunctivae normal.     Pupils: Pupils are equal, round, and reactive to light.  Neck:     Thyroid: No thyromegaly.     Vascular: No JVD.  Cardiovascular:     Rate and Rhythm: Normal rate and regular rhythm.     Heart sounds: Normal heart sounds. No murmur heard.  No friction rub. No gallop.  Pulmonary:     Effort: Pulmonary effort is normal. No respiratory distress.     Breath sounds: Normal breath sounds. No wheezing or rales.  Chest:     Chest wall: No tenderness.  Abdominal:     General: Bowel sounds are normal. There is no distension.     Palpations: Abdomen is soft. There is no mass.     Tenderness: There is no abdominal tenderness. There is no guarding or rebound.  Musculoskeletal:        General: No tenderness. Normal range of motion.     Cervical back: Normal range of motion.  Lymphadenopathy:     Cervical: No cervical adenopathy.  Skin:    General: Skin is warm and dry.     Findings: No rash.  Neurological:     Mental Status: He is alert and oriented to  person, place, and time.     Cranial Nerves: No cranial nerve deficit.     Motor: No abnormal muscle tone.     Coordination: Coordination normal.     Gait: Gait normal.     Deep Tendon Reflexes: Reflexes are normal and symmetric.  Psychiatric:        Behavior: Behavior normal.        Thought Content: Thought content normal.        Judgment: Judgment normal.   Rash on B forearms Rectal - per GI   Lab Results  Component Value Date   WBC 4.7 12/11/2023   HGB 16.4 12/11/2023   HCT 48.0 12/11/2023   PLT 105.0 (L) 12/11/2023   GLUCOSE 96 12/11/2023   CHOL 131 12/11/2023   TRIG 97.0 12/11/2023   HDL 44.10 12/11/2023   LDLDIRECT 146.2 10/07/2010   LDLCALC 68 12/11/2023   ALT 29 12/11/2023   AST 32 12/11/2023   NA 139 12/11/2023   K 4.5 12/11/2023   CL 103 12/11/2023   CREATININE 0.92 12/11/2023   BUN 13 12/11/2023   CO2 27 12/11/2023   TSH 4.05 12/11/2023   PSA 0.83 12/11/2023   INR 1.1 03/04/2021   HGBA1C 5.6 12/11/2023   MICROALBUR 1.1 12/11/2023    VAS [place] ABI WITH/WO TBI Result Date: 02/27/2023  LOWER EXTREMITY DOPPLER STUDY Patient Name:  MIQUEAS WHILDEN  Date of Exam:   02/27/2023 Medical Rec #: [id number]      Accession #:    [id number] Date of Birth: 1951/03/28       Patient Gender: M Patient Age:   69 years Exam Location:  [facility] Procedure:      VAS [place] ABI WITH/WO TBI Referring Phys: Heath Lark --------------------------------------------------------------------------------  Indications: Peripheral artery disease. High Risk         Hypertension, hyperlipidemia, Diabetes, past history of Factors:          smoking.  Comparison Study: 08/08/22 ABI/TBI- right=0.68/0.48, left=1.23/0.65 Performing Technologist: Gertie Fey MHA, RVT, RDCS, RDMS  Examination Guidelines: A complete evaluation includes at minimum, Doppler waveform signals and systolic blood pressure reading at the level of bilateral brachial, anterior tibial, and posterior tibial arteries,  when vessel segments are accessible. Bilateral testing is considered an integral part of a complete examination. Photoelectric Plethysmograph (PPG) waveforms and toe systolic pressure readings are included as required and additional duplex testing as needed. Limited examinations for reoccurring indications may be performed as noted.  ABI Findings: +---------+------------------+-----+----------+--------+ Right    Rt Pressure (mmHg)IndexWaveform  Comment  +---------+------------------+-----+----------+--------+ Brachial 148                                       +---------+------------------+-----+----------+--------+  PTA                             monophasic         +---------+------------------+-----+----------+--------+ DP       138               0.89 monophasic         +---------+------------------+-----+----------+--------+ Great Toe80                0.52                    +---------+------------------+-----+----------+--------+ +---------+------------------+-----+---------+-------+ Left     Lt Pressure (mmHg)IndexWaveform Comment +---------+------------------+-----+---------+-------+ Brachial 155                                     +---------+------------------+-----+---------+-------+ PTA                             biphasic         +---------+------------------+-----+---------+-------+ DP       203               1.31 triphasic        +---------+------------------+-----+---------+-------+ Berline Chough               0.86                  +---------+------------------+-----+---------+-------+ +-------+-----------+-----------+------------+------------+ ABI/TBIToday's ABIToday's TBIPrevious ABIPrevious TBI +-------+-----------+-----------+------------+------------+ Right  0.89       0.52       0.68        0.48         +-------+-----------+-----------+------------+------------+ Left   1.31       0.86       1.23        0.65          +-------+-----------+-----------+------------+------------+  Bilateral ABIs and TBIs appear increased compared to prior study on 08/08/2022.  Summary: Right: Resting right ankle-brachial index indicates mild right lower extremity arterial disease. The right toe-brachial index is abnormal. Left: Resting left ankle-brachial index is within normal range. The left toe-brachial index is normal. *See table(s) above for measurements and observations.  Electronically signed by Heath Lark on 02/27/2023 at 3:26:26 PM.    Final     Assessment & Plan:   Problem List Items Addressed This Visit     Coronary artery disease involving native coronary artery of native heart with angina pectoris (HCC) (Chronic)   Crestor  ASA      Relevant Medications   olmesartan (BENICAR) 40 MG tablet   rosuvastatin (CRESTOR) 20 MG tablet   Anxiety disorder   Xanax prn - rare  Potential benefits of a long term benzodiazepines  use as well as potential risks  and complications were explained to the patient and were aknowledged.      Relevant Medications   ALPRAZolam (XANAX) 0.5 MG tablet   B12 deficiency - Primary   On B12      Type 2 diabetes mellitus with obesity (HCC)   On Mounjaro On Metformin      Relevant Medications   olmesartan (BENICAR) 40 MG tablet   rosuvastatin (CRESTOR) 20 MG tablet   tirzepatide (MOUNJARO) 5 MG/0.5ML Pen      Meds ordered this encounter  Medications   ALPRAZolam (XANAX) 0.5 MG tablet  Sig: Take 1 tablet (0.5 mg total) by mouth 2 (two) times daily as needed for anxiety.    Dispense:  60 tablet    Refill:  1   clobetasol ointment (TEMOVATE) 0.05 %    Sig: Apply 1 Application topically 2 (two) times daily.    Dispense:  120 g    Refill:  3   meloxicam (MOBIC) 15 MG tablet    Sig: Take 1 tablet (15 mg total) by mouth daily as needed for pain.    Dispense:  30 tablet    Refill:  1   olmesartan (BENICAR) 40 MG tablet    Sig: Take 1 tablet (40 mg total) by mouth daily.     Dispense:  90 tablet    Refill:  3   pantoprazole (PROTONIX) 40 MG tablet    Sig: Take 1 tablet (40 mg total) by mouth daily as needed.    Dispense:  90 tablet    Refill:  3   rosuvastatin (CRESTOR) 20 MG tablet    Sig: Take 1 tablet (20 mg total) by mouth daily.    Dispense:  90 tablet    Refill:  1   tirzepatide (MOUNJARO) 5 MG/0.5ML Pen    Sig: Inject 5 mg into the skin once a week.    Dispense:  6 mL    Refill:  3   cyanocobalamin (VITAMIN B12) 1000 MCG tablet    Sig: Take 1 tablet (1,000 mcg total) by mouth daily.    Dispense:  100 tablet    Refill:  3      Follow-up: Return in about 3 months (around 03/13/2024) for a follow-up visit.  Sonda Primes, MD

## 2023-12-12 NOTE — Assessment & Plan Note (Signed)
Xanax prn - rare  Potential benefits of a long term benzodiazepines  use as well as potential risks  and complications were explained to the patient and were aknowledged. 

## 2023-12-12 NOTE — Assessment & Plan Note (Signed)
Crestor ASA 

## 2023-12-12 NOTE — Assessment & Plan Note (Signed)
 On Mounjaro On Metformin

## 2023-12-12 NOTE — Assessment & Plan Note (Signed)
 On B12

## 2024-01-01 ENCOUNTER — Telehealth: Payer: Self-pay | Admitting: Internal Medicine

## 2024-01-01 NOTE — Telephone Encounter (Unsigned)
 Copied from CRM [id number]. Topic: Clinical - Request for Lab/Test Order >> Jan 01, 2024 12:37 PM [place] E wrote: Reason for CRM: Patient called in stating that he was a colonoscopy order to be put in, as it has been awhile since he last had one completed. Patient prefers this to be done the end of May, if possible. Callback number for patient is [phone].

## 2024-01-03 NOTE — Telephone Encounter (Signed)
 Called and spoke with patient. Patient informed me that he attempted to call back to number on file and that it had routed him to our [facility] call center. Provided patient with number to [facility]

## 2024-01-08 ENCOUNTER — Other Ambulatory Visit: Payer: Self-pay | Admitting: Internal Medicine

## 2024-03-11 ENCOUNTER — Ambulatory Visit: Admitting: Internal Medicine

## 2024-03-13 ENCOUNTER — Ambulatory Visit: Admitting: Internal Medicine

## 2024-03-17 ENCOUNTER — Telehealth: Admitting: Internal Medicine

## 2024-03-18 ENCOUNTER — Ambulatory Visit (INDEPENDENT_AMBULATORY_CARE_PROVIDER_SITE_OTHER): Admitting: Internal Medicine

## 2024-03-18 ENCOUNTER — Encounter: Payer: Self-pay | Admitting: Internal Medicine

## 2024-03-18 VITALS — BP 128/68 | HR 72 | Temp 98.2°F | Ht 71.5 in | Wt 247.2 lb

## 2024-03-18 DIAGNOSIS — E1169 Type 2 diabetes mellitus with other specified complication: Secondary | ICD-10-CM | POA: Diagnosis not present

## 2024-03-18 DIAGNOSIS — Z7985 Long-term (current) use of injectable non-insulin antidiabetic drugs: Secondary | ICD-10-CM | POA: Diagnosis not present

## 2024-03-18 DIAGNOSIS — N528 Other male erectile dysfunction: Secondary | ICD-10-CM

## 2024-03-18 DIAGNOSIS — R430 Anosmia: Secondary | ICD-10-CM | POA: Diagnosis not present

## 2024-03-18 DIAGNOSIS — E669 Obesity, unspecified: Secondary | ICD-10-CM | POA: Diagnosis not present

## 2024-03-18 DIAGNOSIS — E538 Deficiency of other specified B group vitamins: Secondary | ICD-10-CM

## 2024-03-18 MED ORDER — TRIAMCINOLONE ACETONIDE 0.1 % EX OINT: 1.0000 | TOPICAL_OINTMENT | Freq: Three times a day (TID) | CUTANEOUS | 3 refills | Status: AC

## 2024-03-18 MED ORDER — TADALAFIL 20 MG PO TABS: 20.0000 mg | ORAL_TABLET | ORAL | 5 refills | Status: AC | PRN

## 2024-03-18 MED ORDER — TIRZEPATIDE 7.5 MG/0.5ML ~~LOC~~ SOAJ: 7.5000 mg | SUBCUTANEOUS | 3 refills | Status: AC

## 2024-03-18 NOTE — Assessment & Plan Note (Signed)
Cialis 20 mg prn 

## 2024-03-18 NOTE — Assessment & Plan Note (Signed)
 On B12

## 2024-03-18 NOTE — Assessment & Plan Note (Addendum)
Will increase Mounjaro dose 

## 2024-03-18 NOTE — Assessment & Plan Note (Signed)
 Fluctuating symptoms ENT ref was offered

## 2024-03-18 NOTE — Progress Notes (Signed)
 Subjective:  Patient ID: BRACE WELTE, male    DOB: November 27, 1950  Age: 73 y.o. MRN: [id number]  CC: Shoulder Pain (3 Month follow up. Left shoulder pain for several months. Notes pain is intermittent. Also wanting to know if automatic blood pressure cuff should be replaced (notes having some higher systolic and diastolic numbers the last few times checking at home)/) and Medical Management of Chronic Issues   HPI Kimberlee Peng presents for   Outpatient Medications Prior to Visit  Medication Sig Dispense Refill   ALPRAZolam  (XANAX ) 0.5 MG tablet Take 1 tablet (0.5 mg total) by mouth 2 (two) times daily as needed for anxiety. 60 tablet 1   cyanocobalamin  (VITAMIN B12) 1000 MCG tablet Take 1 tablet (1,000 mcg total) by mouth daily. 100 tablet 3   meloxicam  (MOBIC ) 15 MG tablet Take 1 tablet (15 mg total) by mouth daily as needed for pain. 30 tablet 1   olmesartan  (BENICAR ) 40 MG tablet Take 1 tablet (40 mg total) by mouth daily. 90 tablet 3   pantoprazole  (PROTONIX ) 40 MG tablet Take 1 tablet (40 mg total) by mouth daily as needed. 90 tablet 3   rosuvastatin  (CRESTOR ) 20 MG tablet TAKE 1 TABLET BY MOUTH DAILY 90 tablet 1   clobetasol  ointment (TEMOVATE ) 0.05 % Apply 1 Application topically 2 (two) times daily. 120 g 3   tirzepatide  (MOUNJARO ) 5 MG/0.5ML Pen Inject 5 mg into the skin once a week. 6 mL 3   No facility-administered medications prior to visit.    ROS: Review of Systems  Constitutional:  Negative for appetite change, fatigue and unexpected weight change.  HENT:  Negative for congestion, nosebleeds, sneezing, sore throat and trouble swallowing.   Eyes:  Negative for itching and visual disturbance.  Respiratory:  Negative for cough.   Cardiovascular:  Negative for chest pain, palpitations and leg swelling.  Gastrointestinal:  Negative for abdominal distention, blood in stool, diarrhea and nausea.  Genitourinary:  Negative for frequency and hematuria.  Musculoskeletal:  Negative  for back pain, gait problem, joint swelling and neck pain.  Skin:  Positive for rash.  Neurological:  Negative for dizziness, tremors, speech difficulty and weakness.  Psychiatric/Behavioral:  Negative for agitation, dysphoric mood, sleep disturbance and suicidal ideas. The patient is not nervous/anxious.     Objective:  BP 128/68   Pulse 72   Temp 98.2 F (36.8 C)   Ht 5' 11.5 (1.816 m)   Wt 247 lb 3.2 oz (112.1 kg)   SpO2 98%   BMI 34.00 kg/m   BP Readings from Last 3 Encounters:  03/18/24 128/68  12/12/23 112/82  12/10/23 132/78    Wt Readings from Last 3 Encounters:  03/18/24 247 lb 3.2 oz (112.1 kg)  12/12/23 252 lb (114.3 kg)  12/10/23 254 lb 6.4 oz (115.4 kg)    Physical Exam Constitutional:      General: He is not in acute distress.    Appearance: He is well-developed. He is obese.     Comments: NAD   Eyes:     Conjunctiva/sclera: Conjunctivae normal.     Pupils: Pupils are equal, round, and reactive to light.   Neck:     Thyroid : No thyromegaly.     Vascular: No JVD.   Cardiovascular:     Rate and Rhythm: Normal rate and regular rhythm.     Heart sounds: Normal heart sounds. No murmur heard.    No friction rub. No gallop.  Pulmonary:     Effort:  Pulmonary effort is normal. No respiratory distress.     Breath sounds: Normal breath sounds. No wheezing or rales.  Chest:     Chest wall: No tenderness.  Abdominal:     General: Bowel sounds are normal. There is no distension.     Palpations: Abdomen is soft. There is no mass.     Tenderness: There is no abdominal tenderness. There is no guarding or rebound.   Musculoskeletal:        General: No tenderness. Normal range of motion.     Cervical back: Normal range of motion.  Lymphadenopathy:     Cervical: No cervical adenopathy.   Skin:    General: Skin is warm and dry.     Findings: Rash present.   Neurological:     Mental Status: He is alert and oriented to person, place, and time.     Cranial  Nerves: No cranial nerve deficit.     Motor: No abnormal muscle tone.     Coordination: Coordination normal.     Gait: Gait normal.     Deep Tendon Reflexes: Reflexes are normal and symmetric.   Psychiatric:        Behavior: Behavior normal.        Thought Content: Thought content normal.        Judgment: Judgment normal.     Lab Results  Component Value Date   WBC 4.7 12/11/2023   HGB 16.4 12/11/2023   HCT 48.0 12/11/2023   PLT 105.0 (L) 12/11/2023   GLUCOSE 96 12/11/2023   CHOL 131 12/11/2023   TRIG 97.0 12/11/2023   HDL 44.10 12/11/2023   LDLDIRECT 146.2 10/07/2010   LDLCALC 68 12/11/2023   ALT 29 12/11/2023   AST 32 12/11/2023   NA 139 12/11/2023   K 4.5 12/11/2023   CL 103 12/11/2023   CREATININE 0.92 12/11/2023   BUN 13 12/11/2023   CO2 27 12/11/2023   TSH 4.05 12/11/2023   PSA 0.83 12/11/2023   INR 1.1 03/04/2021   HGBA1C 5.6 12/11/2023   MICROALBUR 1.1 12/11/2023    VAS US  ABI WITH/WO TBI Result Date: 02/27/2023  LOWER EXTREMITY DOPPLER STUDY Patient Name:  SIDDHANT HASHEMI  Date of Exam:   02/27/2023 Medical Rec #: [id number]      Accession #:    [id number] Date of Birth: 07-17-1951       Patient Gender: M Patient Age:   39 years Exam Location:  [facility] Procedure:      VAS US  ABI WITH/WO TBI Referring Phys: Runell Countryman --------------------------------------------------------------------------------  Indications: Peripheral artery disease. High Risk         Hypertension, hyperlipidemia, Diabetes, past history of Factors:          smoking.  Comparison Study: 08/08/22 ABI/TBI- right=0.68/0.48, left=1.23/0.65 Performing Technologist: Delford Felling MHA, RVT, RDCS, RDMS  Examination Guidelines: A complete evaluation includes at minimum, Doppler waveform signals and systolic blood pressure reading at the level of bilateral brachial, anterior tibial, and posterior tibial arteries, when vessel segments are accessible. Bilateral testing is considered an  integral part of a complete examination. Photoelectric Plethysmograph (PPG) waveforms and toe systolic pressure readings are included as required and additional duplex testing as needed. Limited examinations for reoccurring indications may be performed as noted.  ABI Findings: +---------+------------------+-----+----------+--------+ Right    Rt Pressure (mmHg)IndexWaveform  Comment  +---------+------------------+-----+----------+--------+ Brachial 148                                       +---------+------------------+-----+----------+--------+  PTA                             monophasic         +---------+------------------+-----+----------+--------+ DP       138               0.89 monophasic         +---------+------------------+-----+----------+--------+ Great Toe80                0.52                    +---------+------------------+-----+----------+--------+ +---------+------------------+-----+---------+-------+ Left     Lt Pressure (mmHg)IndexWaveform Comment +---------+------------------+-----+---------+-------+ Brachial 155                                     +---------+------------------+-----+---------+-------+ PTA                             biphasic         +---------+------------------+-----+---------+-------+ DP       203               1.31 triphasic        +---------+------------------+-----+---------+-------+ Rolm Clos               0.86                  +---------+------------------+-----+---------+-------+ +-------+-----------+-----------+------------+------------+ ABI/TBIToday's ABIToday's TBIPrevious ABIPrevious TBI +-------+-----------+-----------+------------+------------+ Right  0.89       0.52       0.68        0.48         +-------+-----------+-----------+------------+------------+ Left   1.31       0.86       1.23        0.65         +-------+-----------+-----------+------------+------------+  Bilateral ABIs and  TBIs appear increased compared to prior study on 08/08/2022.  Summary: Right: Resting right ankle-brachial index indicates mild right lower extremity arterial disease. The right toe-brachial index is abnormal. Left: Resting left ankle-brachial index is within normal range. The left toe-brachial index is normal. *See table(s) above for measurements and observations.  Electronically signed by Runell Countryman on 02/27/2023 at 3:26:26 PM.    Final     Assessment & Plan:   Problem List Items Addressed This Visit     B12 deficiency   On B12      Anosmia - Primary   Fluctuating symptoms ENT ref was offered      Relevant Orders   Ambulatory referral to ENT   Erectile dysfunction   Cialis  20 mg prn      Type 2 diabetes mellitus with obesity (HCC)   Will increase Mounjaro  dose       Relevant Medications   tirzepatide  (MOUNJARO ) 7.5 MG/0.5ML Pen   Other Relevant Orders   Comprehensive metabolic panel with GFR   Hemoglobin A1c      Meds ordered this encounter  Medications   tirzepatide  (MOUNJARO ) 7.5 MG/0.5ML Pen    Sig: Inject 7.5 mg into the skin once a week.    Dispense:  6 mL    Refill:  3   tadalafil  (CIALIS ) 20 MG tablet    Sig: Take 1 tablet (20 mg total) by mouth every three (3) days as needed for erectile dysfunction.  Dispense:  30 tablet    Refill:  5   triamcinolone  ointment (KENALOG ) 0.1 %    Sig: Apply 1 Application topically 3 (three) times daily.    Dispense:  450 g    Refill:  3      Follow-up: Return in about 3 months (around 06/18/2024).  Anitra Barn, MD

## 2024-03-19 ENCOUNTER — Ambulatory Visit: Admitting: Internal Medicine

## 2024-03-26 ENCOUNTER — Ambulatory Visit: Admitting: Internal Medicine

## 2024-04-22 DIAGNOSIS — G4733 Obstructive sleep apnea (adult) (pediatric): Secondary | ICD-10-CM | POA: Diagnosis not present

## 2024-05-08 DIAGNOSIS — R430 Anosmia: Secondary | ICD-10-CM | POA: Diagnosis not present

## 2024-05-14 DIAGNOSIS — R430 Anosmia: Secondary | ICD-10-CM | POA: Diagnosis not present

## 2024-05-14 DIAGNOSIS — J342 Deviated nasal septum: Secondary | ICD-10-CM | POA: Diagnosis not present

## 2024-06-18 ENCOUNTER — Ambulatory Visit: Admitting: Internal Medicine

## 2024-06-30 DIAGNOSIS — M19012 Primary osteoarthritis, left shoulder: Secondary | ICD-10-CM | POA: Diagnosis not present

## 2024-08-25 ENCOUNTER — Other Ambulatory Visit: Payer: Self-pay | Admitting: Internal Medicine

## 2024-11-01 ENCOUNTER — Other Ambulatory Visit: Payer: Self-pay | Admitting: Internal Medicine

## 2024-12-17 ENCOUNTER — Encounter: Admitting: Internal Medicine

## 2024-12-17 ENCOUNTER — Ambulatory Visit
